# Patient Record
Sex: Female | Born: 1955
Health system: Southern US, Community
[De-identification: ages and names within clinical notes are randomized; demographics above are authoritative.]

## PROBLEM LIST (undated history)

## (undated) DIAGNOSIS — I10 Essential (primary) hypertension: Secondary | ICD-10-CM

## (undated) DIAGNOSIS — K219 Gastro-esophageal reflux disease without esophagitis: Secondary | ICD-10-CM

## (undated) DIAGNOSIS — K5900 Constipation, unspecified: Secondary | ICD-10-CM

## (undated) DIAGNOSIS — T7840XA Allergy, unspecified, initial encounter: Secondary | ICD-10-CM

## (undated) DIAGNOSIS — G709 Myoneural disorder, unspecified: Secondary | ICD-10-CM

## (undated) DIAGNOSIS — E079 Disorder of thyroid, unspecified: Secondary | ICD-10-CM

## (undated) DIAGNOSIS — J302 Other seasonal allergic rhinitis: Secondary | ICD-10-CM

## (undated) DIAGNOSIS — F32A Depression, unspecified: Secondary | ICD-10-CM

## (undated) DIAGNOSIS — E785 Hyperlipidemia, unspecified: Secondary | ICD-10-CM

## (undated) DIAGNOSIS — D649 Anemia, unspecified: Secondary | ICD-10-CM

## (undated) DIAGNOSIS — E860 Dehydration: Secondary | ICD-10-CM

## (undated) DIAGNOSIS — N6459 Other signs and symptoms in breast: Secondary | ICD-10-CM

## (undated) DIAGNOSIS — G35 Multiple sclerosis: Secondary | ICD-10-CM

## (undated) DIAGNOSIS — M1990 Unspecified osteoarthritis, unspecified site: Secondary | ICD-10-CM

## (undated) DIAGNOSIS — F329 Major depressive disorder, single episode, unspecified: Secondary | ICD-10-CM

## (undated) DIAGNOSIS — G35D Multiple sclerosis, unspecified: Secondary | ICD-10-CM

## (undated) HISTORY — DX: Gastro-esophageal reflux disease without esophagitis: K21.9

## (undated) HISTORY — DX: Essential (primary) hypertension: I10

## (undated) HISTORY — DX: Multiple sclerosis: G35

## (undated) HISTORY — DX: Other seasonal allergic rhinitis: J30.2

## (undated) HISTORY — PX: COLONOSCOPY: SHX174

## (undated) HISTORY — DX: Constipation, unspecified: K59.00

## (undated) HISTORY — DX: Myoneural disorder, unspecified: G70.9

## (undated) HISTORY — DX: Anemia, unspecified: D64.9

## (undated) HISTORY — DX: Disorder of thyroid, unspecified: E07.9

## (undated) HISTORY — PX: UPPER GASTROINTESTINAL ENDOSCOPY: SHX188

## (undated) HISTORY — DX: Hyperlipidemia, unspecified: E78.5

## (undated) HISTORY — PX: TONSILLECTOMY: SUR1361

## (undated) HISTORY — DX: Unspecified osteoarthritis, unspecified site: M19.90

## (undated) HISTORY — DX: Dehydration: E86.0

## (undated) HISTORY — DX: Depression, unspecified: F32.A

## (undated) HISTORY — PX: TMJ ARTHROSCOPY: SHX1067

## (undated) HISTORY — DX: Major depressive disorder, single episode, unspecified: F32.9

## (undated) HISTORY — DX: Multiple sclerosis, unspecified: G35.D

## (undated) HISTORY — PX: HYSTEROSCOPY: SHX211

## (undated) HISTORY — DX: Allergy, unspecified, initial encounter: T78.40XA

---

## 1999-01-16 ENCOUNTER — Other Ambulatory Visit: Admission: RE | Admit: 1999-01-16 | Discharge: 1999-01-16 | Payer: Self-pay | Admitting: *Deleted

## 2000-08-11 ENCOUNTER — Other Ambulatory Visit: Admission: RE | Admit: 2000-08-11 | Discharge: 2000-08-11 | Payer: Self-pay | Admitting: *Deleted

## 2000-08-26 ENCOUNTER — Encounter: Admission: RE | Admit: 2000-08-26 | Discharge: 2000-08-26 | Payer: Self-pay | Admitting: Family Medicine

## 2000-08-26 ENCOUNTER — Encounter: Payer: Self-pay | Admitting: Family Medicine

## 2000-12-10 ENCOUNTER — Ambulatory Visit (HOSPITAL_COMMUNITY): Admission: RE | Admit: 2000-12-10 | Discharge: 2000-12-10 | Payer: Self-pay | Admitting: Gynecology

## 2000-12-10 ENCOUNTER — Encounter (INDEPENDENT_AMBULATORY_CARE_PROVIDER_SITE_OTHER): Payer: Self-pay

## 2001-07-05 ENCOUNTER — Encounter: Admission: RE | Admit: 2001-07-05 | Discharge: 2001-10-03 | Payer: Self-pay | Admitting: Family Medicine

## 2002-03-14 ENCOUNTER — Encounter: Payer: Self-pay | Admitting: Family Medicine

## 2002-03-14 ENCOUNTER — Encounter: Admission: RE | Admit: 2002-03-14 | Discharge: 2002-03-14 | Payer: Self-pay | Admitting: Family Medicine

## 2002-03-20 ENCOUNTER — Other Ambulatory Visit: Admission: RE | Admit: 2002-03-20 | Discharge: 2002-03-20 | Payer: Self-pay | Admitting: *Deleted

## 2003-02-02 ENCOUNTER — Ambulatory Visit (HOSPITAL_COMMUNITY): Admission: RE | Admit: 2003-02-02 | Discharge: 2003-02-02 | Payer: Self-pay | Admitting: Gynecology

## 2003-02-02 ENCOUNTER — Encounter (INDEPENDENT_AMBULATORY_CARE_PROVIDER_SITE_OTHER): Payer: Self-pay

## 2003-04-19 ENCOUNTER — Ambulatory Visit (HOSPITAL_BASED_OUTPATIENT_CLINIC_OR_DEPARTMENT_OTHER): Admission: RE | Admit: 2003-04-19 | Discharge: 2003-04-19 | Payer: Self-pay | Admitting: Family Medicine

## 2003-05-21 ENCOUNTER — Encounter: Admission: RE | Admit: 2003-05-21 | Discharge: 2003-05-21 | Payer: Self-pay | Admitting: Family Medicine

## 2003-08-02 ENCOUNTER — Other Ambulatory Visit: Admission: RE | Admit: 2003-08-02 | Discharge: 2003-08-02 | Payer: Self-pay | Admitting: Gynecology

## 2005-01-31 ENCOUNTER — Encounter: Admission: RE | Admit: 2005-01-31 | Discharge: 2005-01-31 | Payer: Self-pay | Admitting: Neurology

## 2005-03-04 ENCOUNTER — Encounter: Admission: RE | Admit: 2005-03-04 | Discharge: 2005-03-04 | Payer: Self-pay | Admitting: Neurology

## 2005-06-22 ENCOUNTER — Other Ambulatory Visit: Admission: RE | Admit: 2005-06-22 | Discharge: 2005-06-22 | Payer: Self-pay | Admitting: Gynecology

## 2005-07-10 ENCOUNTER — Ambulatory Visit (HOSPITAL_COMMUNITY): Admission: RE | Admit: 2005-07-10 | Discharge: 2005-07-10 | Payer: Self-pay | Admitting: Gynecology

## 2005-07-13 ENCOUNTER — Emergency Department (HOSPITAL_COMMUNITY): Admission: EM | Admit: 2005-07-13 | Discharge: 2005-07-14 | Payer: Self-pay | Admitting: Emergency Medicine

## 2005-08-26 ENCOUNTER — Other Ambulatory Visit: Admission: RE | Admit: 2005-08-26 | Discharge: 2005-08-26 | Payer: Self-pay | Admitting: Surgery

## 2006-04-03 ENCOUNTER — Encounter: Admission: RE | Admit: 2006-04-03 | Discharge: 2006-04-03 | Payer: Self-pay | Admitting: Neurology

## 2006-04-16 ENCOUNTER — Ambulatory Visit (HOSPITAL_COMMUNITY): Admission: RE | Admit: 2006-04-16 | Discharge: 2006-04-16 | Payer: Self-pay | Admitting: Neurology

## 2006-07-09 ENCOUNTER — Other Ambulatory Visit: Admission: RE | Admit: 2006-07-09 | Discharge: 2006-07-09 | Payer: Self-pay | Admitting: Gynecology

## 2006-07-29 ENCOUNTER — Ambulatory Visit (HOSPITAL_COMMUNITY): Admission: RE | Admit: 2006-07-29 | Discharge: 2006-07-29 | Payer: Self-pay | Admitting: Family Medicine

## 2006-08-10 ENCOUNTER — Encounter: Admission: RE | Admit: 2006-08-10 | Discharge: 2006-08-10 | Payer: Self-pay | Admitting: Obstetrics and Gynecology

## 2007-05-18 ENCOUNTER — Encounter: Payer: Self-pay | Admitting: Gynecology

## 2007-05-18 ENCOUNTER — Ambulatory Visit (HOSPITAL_BASED_OUTPATIENT_CLINIC_OR_DEPARTMENT_OTHER): Admission: RE | Admit: 2007-05-18 | Discharge: 2007-05-18 | Payer: Self-pay | Admitting: Gynecology

## 2008-01-31 ENCOUNTER — Ambulatory Visit: Payer: Self-pay | Admitting: Internal Medicine

## 2008-01-31 ENCOUNTER — Encounter (INDEPENDENT_AMBULATORY_CARE_PROVIDER_SITE_OTHER): Payer: Self-pay | Admitting: Family Medicine

## 2008-01-31 LAB — CONVERTED CEMR LAB
ALT: 13 units/L (ref 0–35)
AST: 21 units/L (ref 0–37)
Albumin: 4.2 g/dL (ref 3.5–5.2)
Alkaline Phosphatase: 135 units/L — ABNORMAL HIGH (ref 39–117)
BUN: 12 mg/dL (ref 6–23)
Basophils Absolute: 0 10*3/uL (ref 0.0–0.1)
Basophils Relative: 1 % (ref 0–1)
CO2: 23 meq/L (ref 19–32)
Calcium: 9.6 mg/dL (ref 8.4–10.5)
Chloride: 101 meq/L (ref 96–112)
Cholesterol: 196 mg/dL (ref 0–200)
Creatinine, Ser: 0.95 mg/dL (ref 0.40–1.20)
Eosinophils Absolute: 0.2 10*3/uL (ref 0.0–0.7)
Eosinophils Relative: 3 % (ref 0–5)
FSH: 33.9 milliintl units/mL
Glucose, Bld: 83 mg/dL (ref 70–99)
HCT: 38.8 % (ref 36.0–46.0)
HDL: 59 mg/dL (ref >39)
Hemoglobin: 12.4 g/dL (ref 12.0–15.0)
LDL Cholesterol: 118 mg/dL — ABNORMAL HIGH (ref 0–99)
MCHC: 32 g/dL (ref 30.0–36.0)
MCV: 79 fL (ref 78.0–100.0)
Monocytes Absolute: 0.5 10*3/uL (ref 0.1–1.0)
Neutro Abs: 5 10*3/uL (ref 1.7–7.7)
Neutrophils Relative %: 61 % (ref 43–77)
Platelets: 344 10*3/uL (ref 150–400)
Potassium: 3.9 meq/L (ref 3.5–5.3)
RBC: 4.91 M/uL (ref 3.87–5.11)
RDW: 16.5 % — ABNORMAL HIGH (ref 11.5–15.5)
Sodium: 136 meq/L (ref 135–145)
TSH: 0.73 microintl units/mL (ref 0.350–4.50)
Total Bilirubin: 0.5 mg/dL (ref 0.3–1.2)
Total CHOL/HDL Ratio: 3.3
Triglycerides: 97 mg/dL (ref <150)
VLDL: 19 mg/dL (ref 0–40)

## 2008-02-01 ENCOUNTER — Ambulatory Visit: Payer: Self-pay | Admitting: *Deleted

## 2008-04-02 ENCOUNTER — Ambulatory Visit: Payer: Self-pay | Admitting: Internal Medicine

## 2008-04-16 ENCOUNTER — Ambulatory Visit (HOSPITAL_COMMUNITY): Admission: RE | Admit: 2008-04-16 | Discharge: 2008-04-16 | Payer: Self-pay | Admitting: Internal Medicine

## 2008-07-19 ENCOUNTER — Ambulatory Visit: Payer: Self-pay | Admitting: Internal Medicine

## 2008-08-07 ENCOUNTER — Encounter (INDEPENDENT_AMBULATORY_CARE_PROVIDER_SITE_OTHER): Payer: Self-pay | Admitting: Adult Health

## 2008-08-07 ENCOUNTER — Ambulatory Visit: Payer: Self-pay | Admitting: Internal Medicine

## 2008-08-07 LAB — CONVERTED CEMR LAB
ALT: 12 units/L (ref 0–35)
AST: 20 units/L (ref 0–37)
Albumin: 4.2 g/dL (ref 3.5–5.2)
Alkaline Phosphatase: 124 units/L — ABNORMAL HIGH (ref 39–117)
BUN: 15 mg/dL (ref 6–23)
Basophils Absolute: 0 10*3/uL (ref 0.0–0.1)
Basophils Relative: 0 % (ref 0–1)
CO2: 26 meq/L (ref 19–32)
Calcium: 9.4 mg/dL (ref 8.4–10.5)
Chloride: 101 meq/L (ref 96–112)
Cholesterol: 182 mg/dL (ref 0–200)
Creatinine, Ser: 0.83 mg/dL (ref 0.40–1.20)
Eosinophils Absolute: 0.2 10*3/uL (ref 0.0–0.7)
Eosinophils Relative: 2 % (ref 0–5)
Glucose, Bld: 79 mg/dL (ref 70–99)
HCT: 36.3 % (ref 36.0–46.0)
HDL: 65 mg/dL (ref >39)
Hemoglobin: 11.3 g/dL — ABNORMAL LOW (ref 12.0–15.0)
LDL Cholesterol: 101 mg/dL — ABNORMAL HIGH (ref 0–99)
Lymphocytes Relative: 26 % (ref 12–46)
Lymphs Abs: 2.4 10*3/uL (ref 0.7–4.0)
MCHC: 31.1 g/dL (ref 30.0–36.0)
MCV: 78.4 fL (ref 78.0–100.0)
Monocytes Absolute: 0.5 10*3/uL (ref 0.1–1.0)
Monocytes Relative: 5 % (ref 3–12)
Neutro Abs: 5.9 10*3/uL (ref 1.7–7.7)
Neutrophils Relative %: 66 % (ref 43–77)
Platelets: 299 10*3/uL (ref 150–400)
Potassium: 3.9 meq/L (ref 3.5–5.3)
RBC: 4.63 M/uL (ref 3.87–5.11)
RDW: 17.5 % — ABNORMAL HIGH (ref 11.5–15.5)
Sodium: 139 meq/L (ref 135–145)
TSH: 0.481 microintl units/mL (ref 0.350–4.500)
Total Bilirubin: 0.5 mg/dL (ref 0.3–1.2)
Total CHOL/HDL Ratio: 2.8
Total Protein: 8.2 g/dL (ref 6.0–8.3)
Triglycerides: 78 mg/dL (ref <150)
VLDL: 16 mg/dL (ref 0–40)
Vit D, 25-Hydroxy: 28 ng/mL — ABNORMAL LOW (ref 30–89)
WBC: 9 10*3/uL (ref 4.0–10.5)

## 2008-09-13 ENCOUNTER — Ambulatory Visit: Payer: Self-pay | Admitting: Internal Medicine

## 2009-01-02 ENCOUNTER — Encounter (INDEPENDENT_AMBULATORY_CARE_PROVIDER_SITE_OTHER): Payer: Self-pay | Admitting: Adult Health

## 2009-01-02 ENCOUNTER — Ambulatory Visit: Payer: Self-pay | Admitting: Family Medicine

## 2009-01-02 LAB — CONVERTED CEMR LAB
ALT: 15 units/L (ref 0–35)
AST: 20 units/L (ref 0–37)
Albumin: 4 g/dL (ref 3.5–5.2)
Alkaline Phosphatase: 109 units/L (ref 39–117)
BUN: 15 mg/dL (ref 6–23)
Basophils Absolute: 0 10*3/uL (ref 0.0–0.1)
Basophils Relative: 0 % (ref 0–1)
CO2: 26 meq/L (ref 19–32)
Calcium: 9.1 mg/dL (ref 8.4–10.5)
Chloride: 102 meq/L (ref 96–112)
Creatinine, Ser: 0.92 mg/dL (ref 0.40–1.20)
Eosinophils Absolute: 0.2 10*3/uL (ref 0.0–0.7)
Eosinophils Relative: 3 % (ref 0–5)
Glucose, Bld: 94 mg/dL (ref 70–99)
HCT: 38 % (ref 36.0–46.0)
Hemoglobin: 11.5 g/dL — ABNORMAL LOW (ref 12.0–15.0)
Hgb A1c MFr Bld: 5.8 % (ref 4.6–6.1)
Lymphocytes Relative: 29 % (ref 12–46)
Lymphs Abs: 2.2 10*3/uL (ref 0.7–4.0)
MCHC: 30.3 g/dL (ref 30.0–36.0)
MCV: 81.7 fL (ref 78.0–100.0)
Microalb, Ur: 1.17 mg/dL (ref 0.00–1.89)
Monocytes Absolute: 0.6 10*3/uL (ref 0.1–1.0)
Monocytes Relative: 8 % (ref 3–12)
Neutro Abs: 4.5 10*3/uL (ref 1.7–7.7)
Neutrophils Relative %: 60 % (ref 43–77)
Platelets: 340 10*3/uL (ref 150–400)
Potassium: 4.4 meq/L (ref 3.5–5.3)
Pro B Natriuretic peptide (BNP): 5.8 pg/mL (ref 0.0–100.0)
RBC: 4.65 M/uL (ref 3.87–5.11)
RDW: 17.7 % — ABNORMAL HIGH (ref 11.5–15.5)
Sodium: 141 meq/L (ref 135–145)
TSH: 0.525 microintl units/mL (ref 0.350–4.500)
Total Bilirubin: 0.4 mg/dL (ref 0.3–1.2)
Total Protein: 7.5 g/dL (ref 6.0–8.3)
WBC: 7.5 10*3/uL (ref 4.0–10.5)

## 2009-01-10 ENCOUNTER — Ambulatory Visit: Payer: Self-pay | Admitting: Internal Medicine

## 2009-01-23 ENCOUNTER — Ambulatory Visit (HOSPITAL_COMMUNITY): Admission: RE | Admit: 2009-01-23 | Discharge: 2009-01-23 | Payer: Self-pay | Admitting: Internal Medicine

## 2009-01-24 ENCOUNTER — Ambulatory Visit: Payer: Self-pay | Admitting: Internal Medicine

## 2009-02-13 ENCOUNTER — Ambulatory Visit: Payer: Self-pay | Admitting: Internal Medicine

## 2009-02-28 ENCOUNTER — Ambulatory Visit: Payer: Self-pay | Admitting: Internal Medicine

## 2009-03-12 ENCOUNTER — Ambulatory Visit: Payer: Self-pay | Admitting: Internal Medicine

## 2009-04-02 ENCOUNTER — Ambulatory Visit: Payer: Self-pay | Admitting: Internal Medicine

## 2009-05-02 ENCOUNTER — Ambulatory Visit (HOSPITAL_COMMUNITY): Admission: RE | Admit: 2009-05-02 | Discharge: 2009-05-02 | Payer: Self-pay | Admitting: Family Medicine

## 2009-06-04 ENCOUNTER — Ambulatory Visit: Payer: Self-pay | Admitting: Family Medicine

## 2009-06-12 ENCOUNTER — Encounter (INDEPENDENT_AMBULATORY_CARE_PROVIDER_SITE_OTHER): Payer: Self-pay | Admitting: Adult Health

## 2009-06-12 ENCOUNTER — Ambulatory Visit: Payer: Self-pay | Admitting: Internal Medicine

## 2009-06-12 LAB — CONVERTED CEMR LAB
ALT: 13 units/L (ref 0–35)
AST: 18 units/L (ref 0–37)
Albumin: 4 g/dL (ref 3.5–5.2)
Alkaline Phosphatase: 101 units/L (ref 39–117)
BUN: 16 mg/dL (ref 6–23)
Basophils Absolute: 0 10*3/uL (ref 0.0–0.1)
Basophils Relative: 0 % (ref 0–1)
CO2: 26 meq/L (ref 19–32)
Calcium: 9.9 mg/dL (ref 8.4–10.5)
Chloride: 102 meq/L (ref 96–112)
Cholesterol: 187 mg/dL (ref 0–200)
Creatinine, Ser: 0.92 mg/dL (ref 0.40–1.20)
Eosinophils Absolute: 0.2 10*3/uL (ref 0.0–0.7)
Eosinophils Relative: 3 % (ref 0–5)
Glucose, Bld: 94 mg/dL (ref 70–99)
HCT: 36 % (ref 36.0–46.0)
HDL: 58 mg/dL (ref >39)
Hemoglobin: 11 g/dL — ABNORMAL LOW (ref 12.0–15.0)
LDL Cholesterol: 120 mg/dL — ABNORMAL HIGH (ref 0–99)
Lymphocytes Relative: 27 % (ref 12–46)
Lymphs Abs: 2 10*3/uL (ref 0.7–4.0)
MCHC: 30.6 g/dL (ref 30.0–36.0)
MCV: 81.3 fL (ref 78.0–100.0)
Monocytes Absolute: 0.4 10*3/uL (ref 0.1–1.0)
Monocytes Relative: 6 % (ref 3–12)
Neutro Abs: 4.7 10*3/uL (ref 1.7–7.7)
Neutrophils Relative %: 64 % (ref 43–77)
Platelets: 351 10*3/uL (ref 150–400)
Potassium: 4.4 meq/L (ref 3.5–5.3)
RBC: 4.43 M/uL (ref 3.87–5.11)
RDW: 16.9 % — ABNORMAL HIGH (ref 11.5–15.5)
Sodium: 139 meq/L (ref 135–145)
TSH: 0.491 microintl units/mL (ref 0.350–4.500)
Total Bilirubin: 0.3 mg/dL (ref 0.3–1.2)
Total CHOL/HDL Ratio: 3.2
Total Protein: 7.6 g/dL (ref 6.0–8.3)
Triglycerides: 47 mg/dL (ref <150)
VLDL: 9 mg/dL (ref 0–40)
WBC: 7.4 10*3/uL (ref 4.0–10.5)

## 2009-06-27 ENCOUNTER — Ambulatory Visit: Payer: Self-pay | Admitting: Internal Medicine

## 2009-07-17 ENCOUNTER — Ambulatory Visit: Payer: Self-pay | Admitting: Internal Medicine

## 2009-07-31 ENCOUNTER — Ambulatory Visit: Payer: Self-pay | Admitting: Internal Medicine

## 2009-08-06 ENCOUNTER — Ambulatory Visit: Payer: Self-pay | Admitting: Internal Medicine

## 2009-08-20 ENCOUNTER — Ambulatory Visit: Payer: Self-pay | Admitting: Family Medicine

## 2010-06-22 ENCOUNTER — Encounter: Payer: Self-pay | Admitting: Family Medicine

## 2010-06-23 ENCOUNTER — Encounter: Payer: Self-pay | Admitting: Internal Medicine

## 2010-09-23 ENCOUNTER — Ambulatory Visit (INDEPENDENT_AMBULATORY_CARE_PROVIDER_SITE_OTHER): Payer: Medicare Other | Admitting: Internal Medicine

## 2010-09-23 ENCOUNTER — Other Ambulatory Visit: Payer: Self-pay | Admitting: Internal Medicine

## 2010-09-23 ENCOUNTER — Encounter: Payer: Self-pay | Admitting: Internal Medicine

## 2010-09-23 DIAGNOSIS — I10 Essential (primary) hypertension: Secondary | ICD-10-CM

## 2010-09-23 DIAGNOSIS — R911 Solitary pulmonary nodule: Secondary | ICD-10-CM | POA: Insufficient documentation

## 2010-09-23 DIAGNOSIS — R5383 Other fatigue: Secondary | ICD-10-CM | POA: Insufficient documentation

## 2010-09-23 DIAGNOSIS — J984 Other disorders of lung: Secondary | ICD-10-CM

## 2010-09-23 DIAGNOSIS — Z13220 Encounter for screening for lipoid disorders: Secondary | ICD-10-CM

## 2010-09-23 DIAGNOSIS — N3281 Overactive bladder: Secondary | ICD-10-CM | POA: Insufficient documentation

## 2010-09-23 DIAGNOSIS — Z79899 Other long term (current) drug therapy: Secondary | ICD-10-CM

## 2010-09-23 DIAGNOSIS — R5381 Other malaise: Secondary | ICD-10-CM

## 2010-09-23 DIAGNOSIS — G35 Multiple sclerosis: Secondary | ICD-10-CM

## 2010-09-23 DIAGNOSIS — Z1231 Encounter for screening mammogram for malignant neoplasm of breast: Secondary | ICD-10-CM

## 2010-09-23 DIAGNOSIS — N318 Other neuromuscular dysfunction of bladder: Secondary | ICD-10-CM

## 2010-09-23 DIAGNOSIS — E041 Nontoxic single thyroid nodule: Secondary | ICD-10-CM

## 2010-09-23 NOTE — Progress Notes (Signed)
  Subjective:    Patient ID: Sydney Pace, female    DOB: 1955-11-13, 55 y.o.   MRN: [id number]  HPI patient presents to clinic to establish primary medical care. Has a known history of multiple sclerosis followed regularly by neurology. Has chronic fatigue presumably related to her MS. Previously took Provigil however stopped the medication approximately 2 weeks ago. Has had no recent MS flare. Does take a combination of Cymbalta and Wellbutrin without adverse effect and feels medications are helpful including the Cymbalta helping pain. Chart indicates history of very small pulmonary nodule noted on chest CT dated every two thousand eight and August 2010. Left upper lobe pulmonary nodule measured to be approximately 6 mm x 9 mm and unchanged on serial chest CTs. There is instrumentation of thyroid nodule in two thousand ten chest CT measured 0.9 x 1.7 cm. Has had no further followup. Has had difficulty losing weight and wishes to resume exercise program though this is limited somewhat by fatigue. Has history of hypertension currently well controlled with ACE inhibitor and diuretic without cough. Does have overactive bladder take medication for this as well. No exacerbating or alleviating factors. Overdue for a mammogram. No other current complaints  Review of past medical history, past surgical history, medications, allergies, social history and family history    Review of Systems  Constitutional: Positive for fatigue. Negative for fever and chills.  HENT: Negative for ear pain, congestion and facial swelling.   Eyes: Negative for pain and redness.  Respiratory: Negative for cough and shortness of breath.   Cardiovascular: Negative for chest pain and palpitations.  Gastrointestinal: Negative for abdominal pain and blood in stool.  Genitourinary: Positive for urgency and frequency. Negative for difficulty urinating.  Musculoskeletal: Positive for back pain. Negative for gait problem.  Skin:  Negative for color change, pallor and rash.  Neurological: Positive for tremors. Negative for seizures and weakness.  Hematological: Negative for adenopathy. Does not bruise/bleed easily.  Psychiatric/Behavioral: Negative for confusion and agitation. The patient is not nervous/anxious.        Objective:   Physical Exam    Physical Exam  [nursing notereviewed. Constitutional:  appears well-developed and well-nourished. No distress.  HENT:  Head: Normocephalic and atraumatic.  Right Ear: Tympanic membrane, external ear and ear canal normal.  Left Ear: Tympanic membrane, external ear and ear canal normal.  Nose: Nose normal.  Neck: Right sided thyroid prominence questionable nodule. nontender. Mouth/Throat: Oropharynx is clear and moist. No oropharyngeal exudate.  Eyes: Conjunctivae are normal. No scleral icterus.  Neck: Neck supple.  Cardiovascular: Normal rate, regular rhythm and normal heart sounds.  Exam reveals no gallop and no friction rub.   No murmur heard. Pulmonary/Chest: Effort normal and breath sounds normal. No respiratory distress. She has no wheezes. She has no rales.  Lymphadenopathy:     no cervical adenopathy.  Neurological:  alert. Intermittent bilateral upper extremity  tremor. Skin: Skin is warm and dry.  not diaphoretic.      Assessment & Plan:

## 2010-09-23 NOTE — Assessment & Plan Note (Signed)
Likely association to multiple sclerosis. Obtain basic labs. Off Provigil currently. Consider amantadine

## 2010-09-23 NOTE — Assessment & Plan Note (Signed)
Stable. Continue current regimen. Can consider DC diuretic in the future if blood pressure remains normotensive

## 2010-09-23 NOTE — Assessment & Plan Note (Signed)
Schedule thyroid ultrasound for interval change. Obtain TSH

## 2010-09-23 NOTE — Assessment & Plan Note (Signed)
Stable without recent exacerbation. Likely associated fatigue. Continue neurology follow up. Consider amantadine

## 2010-09-23 NOTE — Assessment & Plan Note (Signed)
Stable normotensive. Continue current regimen. Consider possible DC of diuretic in the future if possible. Obtain CBC, Chem-7 and lipid panel

## 2010-09-24 ENCOUNTER — Other Ambulatory Visit: Payer: Medicare Other

## 2010-09-24 LAB — CBC WITH DIFFERENTIAL/PLATELET
Basophils Relative: 0.5 % (ref 0.0–3.0)
HCT: 37.8 % (ref 36.0–46.0)
Hemoglobin: 12.7 g/dL (ref 12.0–15.0)
Lymphocytes Relative: 31.1 % (ref 12.0–46.0)
MCHC: 33.5 g/dL (ref 30.0–36.0)
Monocytes Relative: 5.7 % (ref 3.0–12.0)
Neutro Abs: 4.7 10*3/uL (ref 1.4–7.7)
RBC: 4.32 Mil/uL (ref 3.87–5.11)

## 2010-09-24 LAB — BASIC METABOLIC PANEL
BUN: 18 mg/dL (ref 6–23)
CO2: 32 mEq/L (ref 19–32)
Calcium: 9.5 mg/dL (ref 8.4–10.5)
Chloride: 103 mEq/L (ref 96–112)
Creatinine, Ser: 1.1 mg/dL (ref 0.4–1.2)
GFR: 66.15 mL/min (ref >60.00)
Glucose, Bld: 93 mg/dL (ref 70–99)
Potassium: 4.7 mEq/L (ref 3.5–5.1)
Sodium: 141 mEq/L (ref 135–145)

## 2010-09-24 LAB — LIPID PANEL
Cholesterol: 185 mg/dL (ref 0–200)
HDL: 48.7 mg/dL (ref >39.00)
LDL Cholesterol: 125 mg/dL — ABNORMAL HIGH (ref 0–99)
Total CHOL/HDL Ratio: 4
Triglycerides: 56 mg/dL (ref 0.0–149.0)
VLDL: 11.2 mg/dL (ref 0.0–40.0)

## 2010-09-24 LAB — TSH: TSH: 0.57 u[IU]/mL (ref 0.35–5.50)

## 2010-09-24 LAB — HEPATIC FUNCTION PANEL
Alkaline Phosphatase: 96 U/L (ref 39–117)
Bilirubin, Direct: 0.1 mg/dL (ref 0.0–0.3)
Total Bilirubin: 0.6 mg/dL (ref 0.3–1.2)

## 2010-09-24 NOTE — Progress Notes (Signed)
Addended by: Rossie Muskrat on: 09/24/2010 08:34 AM   Modules accepted: Orders

## 2010-09-26 ENCOUNTER — Ambulatory Visit
Admission: RE | Admit: 2010-09-26 | Discharge: 2010-09-26 | Disposition: A | Discharge status: Home / Self Care | Payer: Medicare Other | Source: Ambulatory Visit | Attending: Internal Medicine | Admitting: Internal Medicine

## 2010-09-26 DIAGNOSIS — E041 Nontoxic single thyroid nodule: Secondary | ICD-10-CM

## 2010-09-28 ENCOUNTER — Other Ambulatory Visit: Payer: Self-pay | Admitting: Internal Medicine

## 2010-09-28 DIAGNOSIS — E042 Nontoxic multinodular goiter: Secondary | ICD-10-CM

## 2010-09-29 ENCOUNTER — Telehealth: Payer: Self-pay

## 2010-09-29 NOTE — Telephone Encounter (Signed)
Message copied by Kyung Rudd on Mon Sep 29, 2010  2:48 PM ------      Message from: Letitia Libra, [place]      Created: Sun Sep 28, 2010 11:20 AM       Labs nl

## 2010-09-29 NOTE — Telephone Encounter (Signed)
Message copied by Kyung Rudd on Mon Sep 29, 2010  2:57 PM ------      Message from: Letitia Libra, [place]      Created: Sun Sep 28, 2010 11:16 AM       Thyroid US does show nodules. Large enough they need to be evaluated by specialist and possibly biopsied.

## 2010-09-29 NOTE — Telephone Encounter (Signed)
Pt aware.

## 2010-10-01 ENCOUNTER — Ambulatory Visit
Admission: RE | Admit: 2010-10-01 | Discharge: 2010-10-01 | Disposition: A | Discharge status: Home / Self Care | Payer: Medicare Other | Source: Ambulatory Visit | Attending: Internal Medicine | Admitting: Internal Medicine

## 2010-10-01 DIAGNOSIS — Z1231 Encounter for screening mammogram for malignant neoplasm of breast: Secondary | ICD-10-CM

## 2010-10-02 ENCOUNTER — Other Ambulatory Visit: Payer: Self-pay | Admitting: Internal Medicine

## 2010-10-02 DIAGNOSIS — R928 Other abnormal and inconclusive findings on diagnostic imaging of breast: Secondary | ICD-10-CM

## 2010-10-08 ENCOUNTER — Other Ambulatory Visit: Payer: Medicare Other

## 2010-10-14 ENCOUNTER — Inpatient Hospital Stay: Admission: RE | Admit: 2010-10-14 | Payer: Medicare Other | Source: Ambulatory Visit

## 2010-10-14 NOTE — H&P (Signed)
Sydney Pace, Sydney Pace              ACCOUNT NO.:  [id number]   MEDICAL RECORD NO.:  [id number]          PATIENT TYPE:  AMB   LOCATION:  [facility]                         FACILITY:  [facility]   PHYSICIAN:  Timothy P. Fontaine, M.D.DATE OF BIRTH:  09-02-1955   DATE OF ADMISSION:  05/18/2007  DATE OF DISCHARGE:                              HISTORY & PHYSICAL   CHIEF COMPLAINT:  Menorrhagia, leiomyoma   HISTORY OF PRESENT ILLNESS:  A 55 year old G 0, female presents  complaining of menorrhagia.  The patient underwent sono-histogram which  showed intramural myomas, as well as an endometrial defect consistent  with a polyp.  She is admitted at this time for hysteroscopy, D&C,  removal of her polyp.   PAST MEDICAL HISTORY:  Significant for:  1. Hypertension.  2. MS.  3. Hypothyroidism.   PAST SURGICAL HISTORY:  Includes:  1. Tonsil adenoidectomy.  2. TMJ surgery.  3. Cryo, hysteroscopy D&C x2 for polyps.   CURRENT MEDICATIONS:  Per medication reconciliation sheet   REVIEW OF SYSTEMS:  Noncontributory.   SOCIAL HISTORY:  Noncontributory.   FAMILY HISTORY:  Noncontributory.   PHYSICAL EXAM:  Afebrile, vital signs stable.  HEENT: Normal.  LUNGS:  Clear.  CARDIAC:  Regular rate.  No rubs, murmurs or gallops.  ABDOMINAL EXAM:  Benign  PELVIC:  External BUS, vagina normal.  Cervix grossly normal.  Uterus  grossly normal in size, midline and mobile, nontender.  Adnexa without  masses or tenderness.   ASSESSMENT:  A 55 year old female history of menorrhagia, leiomyoma.  She recently had been on Depo-Lupron suppression, underwent sono-  histogram which suggests endometrial polyps and is admitted for  hysteroscopy, D&C, removal of polyps.   The plan was to suppress her heavy menses with the Lupron through  menopause, but given the findings of the polyps we feel it is most  prudent to proceed with a hysteroscopy and removal of the polyps.  I  reviewed the proposed surgery with the patient,  the expected  intraoperative, postoperative courses.  She has undergone these  procedures in the past.  I discussed, instrumentation, use of the  resectoscope, D&C.  The acute risks of bleeding, transfusion, infection,  uterine perforation, damage to internal organs including bowel, bladder,  ureters, vessels and nerves either immediately recognized, delay  recognized necessitating major exploratory reparative surgeries, future  reparative surgeries, bowel resection, ostomy formation was all  discussed, understood and accepted.  Distended media absorption was  reviewed, metabolic complications to include seizures, comas discussed,  understood and accepted.  The patient's questions were answered to her  satisfaction.  She is ready to proceed with surgery.      Timothy P. Fontaine, M.D.  Electronically Signed     TPF/MEDQ  D:  05/17/2007  T:  05/18/2007  Job:  [id number]

## 2010-10-14 NOTE — Op Note (Signed)
Sydney Pace, Sydney Pace              ACCOUNT NO.:  [id number]   MEDICAL RECORD NO.:  [id number]          PATIENT TYPE:  AMB   LOCATION:  [facility]                         FACILITY:  [facility]   PHYSICIAN:  Timothy P. Fontaine, M.D.DATE OF BIRTH:  August 13, 1955   DATE OF PROCEDURE:  05/18/2007  DATE OF DISCHARGE:                               OPERATIVE REPORT   PREOPERATIVE DIAGNOSES:  Endometrial polyps.   POSTOPERATIVE DIAGNOSES:  Endometrial polyps.   PROCEDURE:  Hysteroscopic uterine polypectomy, dilatation and curettage.   SURGEON:  Timothy P. Fontaine, M.D.   ANESTHESIA:  General with 1% lidocaine paracervical block.   SPECIMEN:  1. Endometrial curettings.  2. Endometrial polyps.   COMPLICATIONS:  None   ESTIMATED BLOOD LOSS:  Minimal.   SORBITOL DISCREPANCY:  Minimal.   FINDINGS:  EUA external BUS vagina with atrophic changes. Cervix grossly  normal.  Bimanual uterus normal size.  Adnexa without gross masses.  Bimanual limited by abdominal girth.  Hysteroscopic two areas of polyps,  one upper right anterior fundal region, other lower left anterior  region, both excised entirely and sent to pathology as a separate  specimen.  Hysteroscopy, otherwise, was adequate normal noting atrophic  endometrium   PROCEDURE IN DETAIL:  The patient was taken to the operating room and  underwent general anesthesia.  She was placed in the low dorsal  lithotomy position, received a perineal vaginal preparation with  Betadine solution.  The bladder was emptied with in-and-out Foley  catheterization.  EUA performed.  The patient draped in the usual  fashion.  The cervix was visualized with a speculum, the anterior lip  grasped with a single tooth tenaculum.  A paracervical block was placed  using 1% lidocaine, a total of 10 mL.  The cervix was gently gradually  dilated to admit the operative hysteroscope.  Hysteroscopy was performed  with findings noted above.  Using the right angle resectoscope  loupe,  both polyp areas were excised in their entirety at the level of the  surrounding endometrium and sent as separate specimen.  A sharp  curettage was then performed with scant return, again sent as a separate  specimen.  Repeat hysteroscopy showed an empty cavity, good distention,  no evidence of perforation.  The instruments were removed.  Hemostasis  was visualized.  The patient was placed in the supine position, awakened  without difficulty, and taken to the recovery room in good condition  having tolerated procedure well.      Timothy P. Fontaine, M.D.  Electronically Signed     TPF/MEDQ  D:  05/18/2007  T:  05/18/2007  Job:  [id number]

## 2010-10-17 NOTE — H&P (Signed)
Sydney Pace, Sydney Pace                          ACCOUNT NO.:  [id number]   MEDICAL RECORD NO.:  [id number]                   PATIENT TYPE:  AMB   LOCATION:  [facility]                                  FACILITY:  [facility]   PHYSICIAN:  Timothy P. Fontaine, M.D.           DATE OF BIRTH:  1956/05/17   DATE OF ADMISSION:  DATE OF DISCHARGE:                                HISTORY & PHYSICAL   DATE OF ADMISSION:  February 02, 2003 at 7:30 a.m.   CHIEF COMPLAINT:  Heavy bleeding.   HISTORY OF PRESENT ILLNESS:  A 55 year old G4 P0 AB3 female who presents  with a history of progressively worsening menorrhagia.  She had a  sonohysterogram which showed some intramural fibroids, the largest measuring  51 mm.  She had a saline infusion sonohysterogram which outlined an  endometrial polyp measuring 15 x 10 x 9 mm along the anterior wall.  She is  admitted for hysteroscopy resection of the polyp/D&C.   PAST MEDICAL HISTORY:  Significant for multiple sclerosis and high blood  pressure.   PAST SURGICAL HISTORY:  1. Tonsillectomy.  2. TMJ surgery.  3. Cryosurgery to the cervix.  4. Hysteroscopy D&C.   ALLERGIES:  Seasonal.   REVIEW OF SYSTEMS:  Noncontributory.   SOCIAL HISTORY:  Noncontributory.   FAMILY HISTORY:  Noncontributory.   CURRENT MEDICATIONS:  Calcium, Nexium, Atacand for blood pressure, Provigil  for her MS, hydrochlorothiazide for blood pressure, multivitamins, Anaprox  DS.   PHYSICAL EXAMINATION:  VITAL SIGNS:  Afebrile; vital signs stable.  HEENT:  Normal.  LUNGS:  Clear.  CARDIAC:  Regular rate; no rubs, murmurs, or gallops.  ABDOMEN:  Benign.  PELVIC:  External, BUS, vagina normal.  Cervix normal.  Uterus limited by  abdominal girth.  No gross masses or significant tenderness.  Adnexa without  masses or tenderness.   ASSESSMENT:  A 55 year old with progressive menorrhagia, history of  leiomyomata.  Sonohysterogram shows endometrial polyp.  Outpatient  laboratory evaluation  shows normal thyroid panel, normal FSH, and a  hemoglobin of 11.5.  She is admitted for hysteroscopy resection of  polyp/D&C.  The risks, benefits, indications, and alternatives were reviewed  with the patient to include the expected intraoperative and postoperative  courses.  Instrumentation, use of the resectoscope was reviewed, and the  risks of bleeding, transfusion, infection, uterine perforation, damage to  internal organs including bowel, bladder, ureters, vessels, and nerves  necessitating major exploratory reparative surgeries and future reparative  surgeries including ostomy formation was all discussed, understood, and  accepted.  The patient's questions were answered to her satisfaction and she  is ready to proceed with surgery.                                               Timothy P. Fontaine,  M.D.    TPF/MEDQ  D:  01/31/2003  T:  01/31/2003  Job:  [id number]

## 2010-10-17 NOTE — Op Note (Signed)
   NAMEDALAINA, Sydney Pace                          ACCOUNT NO.:  [id number]   MEDICAL RECORD NO.:  [id number]                   PATIENT TYPE:  AMB   LOCATION:  [facility]                                  FACILITY:  [facility]   PHYSICIAN:  Timothy P. Fontaine, M.D.           DATE OF BIRTH:  [birth year]   DATE OF PROCEDURE:  02/02/2003  DATE OF DISCHARGE:                                 OPERATIVE REPORT   PREOPERATIVE DIAGNOSIS:  Endometrial polyp.   POSTOPERATIVE DIAGNOSES:  Endometrial polyp.   PROCEDURES:  1. Hysteroscopic resection, endometrial polyp.  2. Dilatation and curettage.   SURGEON:  Timothy P. Fontaine, M.D.   ANESTHESIA:  General.   COMPLICATIONS:  None.   ESTIMATED BLOOD LOSS:  Minimal.   SORBITOL DISCREPANCY:  Approximately 50 mL.   SPECIMENS:  1. Endometrial polyp.  2. Endometrial curetting.   FINDINGS:  Left anterior mid cavity polyp, excised at its base.  Remainder  of the hysteroscopy was normal.   PROCEDURE:  The patient was taken to the operating room, underwent general  anesthesia, was placed in the dorsal lithotomy position, received a vaginal  perineal preparation with Betadine solution per nursing personnel and the  bladder was emptied with in-and-out Foley catheterization.  The patient was  draped in the usual fashion. The cervix visualized with a speculum.  The  anterior lip grasped with a single-tooth tenaculum and the cervix was gently  gradually dilated to admit the operative hysteroscope.  Hysteroscopy was  performed with findings noted above.  Using the right-angle resectoscopic  loop, the polyp was excised at its base and sent to pathology.  A sharp  curettage was then performed without difficulty and was sent to pathology.  Rehysteroscopy after the curettage had good showed good distention, empty  cavity, no evidence of perforation.  The instruments were removed.  Hemostasis visualized.  The patient placed in the supine position, awakened  without  difficulty, taken to the recovery room in good condition having  tolerated the procedure well.                                               Timothy P. Audie Box, M.D.   TPF/MEDQ  D:  02/02/2003  T:  02/02/2003  Job:  [id number]

## 2010-10-17 NOTE — H&P (Signed)
[facility]  Patient:    Sydney Pace, Sydney Pace                       MRN: [id number] Attending:  Nadyne Coombes. Fontaine, M.D.                         History and Physical  SCHEDULED SURGERY DATE:       12/10/2000 at 2 p.m.  CHIEF COMPLAINT:              Menorrhagia.  HISTORY OF PRESENT ILLNESS:   A 55 year old, G4, P0, Ab3, female presents with  history of menorrhagia. The patient notes over the last several years her periods have progressively gotten heavier where she will now flood for several days each month with total menses lasting approximately five days. She underwent ultrasound evaluation which showed multiple small myomas, the largest measuring 44 mm and sonohysterogram performed which shows an endometrial polyp projecting from the anterior cavity. The patient is admitted now for hysteroscopy/D&C.  PAST MEDICAL HISTORY:         Significant for hypertension.  PAST SURGICAL HISTORY:        Tonsillectomy, TMJ surgery, cryosurgery of the cervix.  ALLERGIES:                    No known drug allergies.  MEDICATIONS:                  Hydrochlorothiazide 25 mg per day, Paxil, Vioxx, Zyrtec.  REVIEW OF SYSTEMS:            Noncontributory.  SOCIAL HISTORY:               No cigarette, alcohol abuse.  ADMISSION PHYSICAL EXAMINATION:  VITAL SIGNS:                  Afebrile, vital signs stable.  HEENT:                        Normal.  LUNGS:                        Clear.  CARDIAC:                      Regular rate, no rubs, murmurs, or gallops.  ABDOMEN:                      Benign.  PELVIC:                       External, BUS, vagina normal. Cervix grossly normal. Uterus limited by abdominal girth but grossly normal. Adnexa without masses or tenderness.  ASSESSMENT:                   A 55 year old, G5, P4, Ab3, female, not sexually active, history of worsening menorrhagia. Ultrasound showing multiple small myomas as well as a polypoid growth  from the anterior uterine cavity.  PLAN:                         The patient is admitted for hysteroscopy/D&C. The risks, benefits, indications, and alternatives for the procedure were reviewed with the patient. I discussed what is involved with hysteroscopy/D&C, the use of the resectoscope, and the risks associated with the procedure to include  infection, transfusion, uterine perforation, damage to internal organs including bowel, bladder, ureters, vessels, and nerves necessitating major exploratory reparative surgeries and future reparative surgeries, all of which was discussed, understood, and accepted. I reviewed with the patient that we are addressing only the polypoid area within the cavity. She does have multiple myomas and no guarantees were made as far as relief of her heavy periods or dysmenorrhea, as I think these are probably are more associated with her leiomyomata, but we will at least initially evaluate her cavity to include this polypoid area and then followup postoperatively and see how her menses are. The patient is comfortable with the plan and her questions are answered and she is ready to proceed with surgery.  DD:  12/03/00 TD:  12/03/00 Job: [id number]

## 2010-10-17 NOTE — Op Note (Signed)
[facility]  Patient:    Sydney Pace, Sydney Pace                       MRN: [id number] Proc. Date: 12/10/00 Adm. Date:  [id number] Attending:  Merrily Pew                           Operative Report  PREOPERATIVE DIAGNOSES:       1. Menorrhagia.                               2. Leiomyomata.                               3. Rule out endometrial polyp.  POSTOPERATIVE DIAGNOSES:      1. Menorrhagia.                               2. Leiomyomata.  OPERATION:                    Hysteroscopy dilatation and curettage.  SURGEON:                      Timothy P. Fontaine, M.D.  ANESTHESIA:                   General.  ESTIMATED BLOOD LOSS:         Minimal  COMPLICATIONS:                 None.  SORBITOL DISCREPANCY:         Approximately 100 cc.  SPECIMENS:                    Endometrial curetting.  FINDINGS:                     "Fluffy" endometrial pattern anterior cavity. No true polyps seen.  Posterior uterine surface grossly normal.  Fundus normal.  Right and left tubal ostia normal.  Low uterine segment normal. Endocervical canal normal.  DESCRIPTION OF PROCEDURE:     The patient was taken to the operating room and underwent general anesthesia.  She was placed in the low dorsolithotomy position, received a perineal and vaginal preparation with Betadine solution and bladder emptied with Foley catheterization.  The patient was draped in the usual fashion.  The cervix was visualized and the anterior lip of the cervix grasped with a single-tooth tenaculum.  The cervix was gently dilated to admit the operative hysteroscope.  Hysteroscopy was performed with findings of a proliferative, undulating anterior cavity pattern, although no true polyps seen.  No other abnormalities were seen.  A sharp curettage was performed. Repeat hysteroscopy showed complete emptying of the cavity.  Good uterine distention, no evidence of perforation, and a normal cavity  visualized.  The instruments were then removed, the tenaculum removed.  Adequate hemostasis was visualized.  The patient was placed in the supine position and awakened without difficulty and taken to the recovery room in good condition having tolerated the procedure well. DD:  12/10/00 TD:  12/10/00 Job: [id number]

## 2010-10-20 ENCOUNTER — Telehealth: Payer: Self-pay

## 2010-10-20 ENCOUNTER — Ambulatory Visit
Admission: RE | Admit: 2010-10-20 | Discharge: 2010-10-20 | Disposition: A | Discharge status: Home / Self Care | Payer: Medicare Other | Source: Ambulatory Visit | Attending: Internal Medicine | Admitting: Internal Medicine

## 2010-10-20 DIAGNOSIS — R928 Other abnormal and inconclusive findings on diagnostic imaging of breast: Secondary | ICD-10-CM

## 2010-10-20 NOTE — Telephone Encounter (Signed)
Pt notfied.

## 2010-10-20 NOTE — Telephone Encounter (Signed)
Message copied by Kyung Rudd on Mon Oct 20, 2010  5:01 PM ------      Message from: Letitia Libra, Maisie Fus      Created: Mon Oct 20, 2010  2:34 PM       pls notify diagnostic mammogram neg/nl. No abnormality seen

## 2011-01-23 ENCOUNTER — Ambulatory Visit: Payer: Medicare Other | Admitting: Internal Medicine

## 2011-01-23 ENCOUNTER — Encounter: Payer: Self-pay | Admitting: Internal Medicine

## 2011-01-23 ENCOUNTER — Ambulatory Visit (INDEPENDENT_AMBULATORY_CARE_PROVIDER_SITE_OTHER): Payer: Medicare Other | Admitting: Internal Medicine

## 2011-01-23 DIAGNOSIS — M7989 Other specified soft tissue disorders: Secondary | ICD-10-CM | POA: Insufficient documentation

## 2011-01-23 DIAGNOSIS — E042 Nontoxic multinodular goiter: Secondary | ICD-10-CM

## 2011-01-23 DIAGNOSIS — F329 Major depressive disorder, single episode, unspecified: Secondary | ICD-10-CM

## 2011-01-23 DIAGNOSIS — F32A Depression, unspecified: Secondary | ICD-10-CM

## 2011-01-23 DIAGNOSIS — E041 Nontoxic single thyroid nodule: Secondary | ICD-10-CM

## 2011-01-23 MED ORDER — FUROSEMIDE 20 MG PO TABS: ORAL_TABLET | ORAL | Status: DC

## 2011-01-23 NOTE — Assessment & Plan Note (Signed)
Hx and exam not suggestive of dvt. Begin lasix prn.

## 2011-01-23 NOTE — Assessment & Plan Note (Signed)
suboptimal control despite cymbalta/wellbutrin and therapist. Schedule psychiatry referral.

## 2011-01-23 NOTE — Assessment & Plan Note (Signed)
Endocrinology consult. Consider bx.

## 2011-01-23 NOTE — Progress Notes (Signed)
  Subjective:    Patient ID: Sydney Pace, female    DOB: [birth year], 55 y.o.   MRN: [id number]  HPI Pt presents to clinic for followup of multiple medical problems. Notes poorly controlled depression with intermitent crying spells. No obvious external stressors currently. Seeing therapist who mentioned psychiatrist referral. BP reviewed nl. Reviewed past h/o thyroid nodules with nl tsh. No dysphagia or neck pain. Does note intermittent bilateral le swelling of ankles. No associated cp or dyspnea. Worsens by the end of the day. No other complaints.  Past Medical History  Diagnosis Date  . Depression   . Allergy   . GERD (gastroesophageal reflux disease)   . Hypertension   . Thyroid disease    Past Surgical History  Procedure Date  . Tonsillectomy     reports that she has never smoked. She does not have any smokeless tobacco history on file. She reports that she does not drink alcohol or use illicit drugs. family history is not on file. No Known Allergies   Review of Systems see hpi    Objective:   Physical Exam  Physical Exam  Nursing note and vitals reviewed. Constitutional: Appears well-developed and well-nourished. No distress.  HENT:  Head: Normocephalic and atraumatic.  Right Ear: External ear normal.  Left Ear: External ear normal.  Eyes: Conjunctivae are normal. No scleral icterus.  Neck: Neck supple. Carotid bruit is not present.  Cardiovascular: Normal rate, regular rhythm and normal heart sounds.  Exam reveals no gallop and no friction rub.   No murmur heard. Pulmonary/Chest: Effort normal and breath sounds normal. No respiratory distress. He has no wheezes. no rales.  Lymphadenopathy:    He has no cervical adenopathy.  Neurological:Alert.  Skin: Skin is warm and dry. Not diaphoretic.  Psychiatric: Has a normal mood and affect.   Ext: +soft tissue swelling without edema     Assessment & Plan:

## 2011-02-09 ENCOUNTER — Other Ambulatory Visit: Payer: Self-pay | Admitting: *Deleted

## 2011-02-09 MED ORDER — LISINOPRIL-HYDROCHLOROTHIAZIDE 20-25 MG PO TABS: 1.0000 | ORAL_TABLET | Freq: Every day | ORAL | Status: DC

## 2011-02-09 NOTE — Telephone Encounter (Signed)
Patient called requesting a refill on Lisinopril. She would like to know if she could get a 90 day supply to [place] on [address].  Rx refill sent to [facility] pharmacy.

## 2011-03-06 LAB — POCT PREGNANCY, URINE: Preg Test, Ur: NEGATIVE

## 2011-05-22 ENCOUNTER — Ambulatory Visit: Payer: Medicare Other | Admitting: Internal Medicine

## 2011-05-22 DIAGNOSIS — Z0289 Encounter for other administrative examinations: Secondary | ICD-10-CM

## 2011-06-16 ENCOUNTER — Encounter: Payer: Self-pay | Admitting: Internal Medicine

## 2011-06-16 ENCOUNTER — Ambulatory Visit (HOSPITAL_BASED_OUTPATIENT_CLINIC_OR_DEPARTMENT_OTHER)
Admission: RE | Admit: 2011-06-16 | Discharge: 2011-06-16 | Disposition: A | Discharge status: Home / Self Care | Payer: Medicare Other | Source: Ambulatory Visit | Attending: Internal Medicine | Admitting: Internal Medicine

## 2011-06-16 ENCOUNTER — Ambulatory Visit (INDEPENDENT_AMBULATORY_CARE_PROVIDER_SITE_OTHER): Payer: Medicare Other | Admitting: Internal Medicine

## 2011-06-16 DIAGNOSIS — Z79899 Other long term (current) drug therapy: Secondary | ICD-10-CM

## 2011-06-16 DIAGNOSIS — E049 Nontoxic goiter, unspecified: Secondary | ICD-10-CM | POA: Insufficient documentation

## 2011-06-16 DIAGNOSIS — E041 Nontoxic single thyroid nodule: Secondary | ICD-10-CM

## 2011-06-16 DIAGNOSIS — E042 Nontoxic multinodular goiter: Secondary | ICD-10-CM

## 2011-06-16 DIAGNOSIS — I10 Essential (primary) hypertension: Secondary | ICD-10-CM

## 2011-06-16 LAB — TSH: TSH: 0.733 u[IU]/mL (ref 0.350–4.500)

## 2011-06-16 LAB — HEPATIC FUNCTION PANEL
Albumin: 3.9 g/dL (ref 3.5–5.2)
Total Bilirubin: 0.3 mg/dL (ref 0.3–1.2)
Total Protein: 7.3 g/dL (ref 6.0–8.3)

## 2011-06-16 LAB — BASIC METABOLIC PANEL
BUN: 19 mg/dL (ref 6–23)
Calcium: 9.6 mg/dL (ref 8.4–10.5)
Creat: 0.92 mg/dL (ref 0.50–1.10)

## 2011-06-16 NOTE — Progress Notes (Signed)
  Subjective:    Patient ID: Sydney Pace, female    DOB: 02-May-1956, 56 y.o.   MRN: [id number]  HPI Pt presents to clinic for followup of multiple medical problems. Known history of thyroid nodules and has repeatedly been referred to endocrine for further evaluation but has not kept appointments. BP reviewed as normotensive. No active complaint.  Past Medical History  Diagnosis Date  . Depression   . Allergy   . GERD (gastroesophageal reflux disease)   . Hypertension   . Thyroid disease    Past Surgical History  Procedure Date  . Tonsillectomy     reports that she has never smoked. She has never used smokeless tobacco. She reports that she does not drink alcohol or use illicit drugs. family history is not on file. No Known Allergies    Review of Systems see hpi     Objective:   Physical Exam  Nursing note and vitals reviewed. Constitutional: She appears well-developed and well-nourished. No distress.  HENT:  Head: Normocephalic and atraumatic.  Right Ear: External ear normal.  Left Ear: External ear normal.  Eyes: Conjunctivae are normal. No scleral icterus.  Neck: Neck supple.       +thyroid nodules. NT  Cardiovascular: Normal rate, regular rhythm and normal heart sounds.  Exam reveals no gallop and no friction rub.   No murmur heard. Pulmonary/Chest: Effort normal and breath sounds normal. No respiratory distress. She has no wheezes. She has no rales.  Neurological: She is alert.  Skin: Skin is warm and dry. She is not diaphoretic.  Psychiatric: She has a normal mood and affect.          Assessment & Plan:

## 2011-06-20 NOTE — Assessment & Plan Note (Signed)
Long discussion held. Reiterated the need for further evaluation. Explained again that thyroid nodules are potentially cancerous. Strongly recommended endocrinology referral and potential need for biopsy. Pt stated understanding and agreement. Schedule follow up thyroid ultrasound. Obtain tsh. Schedule endocrinology referral. Close followup scheduled as well.

## 2011-06-20 NOTE — Assessment & Plan Note (Addendum)
Normotensive and stable. Continue current regimen. Monitor bp as outpt and followup in clinic as scheduled. Obtain chem7 

## 2011-07-09 ENCOUNTER — Other Ambulatory Visit: Payer: Self-pay | Admitting: Endocrinology

## 2011-07-09 DIAGNOSIS — E041 Nontoxic single thyroid nodule: Secondary | ICD-10-CM

## 2011-07-14 ENCOUNTER — Ambulatory Visit
Admission: RE | Admit: 2011-07-14 | Discharge: 2011-07-14 | Disposition: A | Discharge status: Home / Self Care | Payer: Medicare Other | Source: Ambulatory Visit | Attending: Endocrinology | Admitting: Endocrinology

## 2011-07-14 ENCOUNTER — Other Ambulatory Visit (HOSPITAL_COMMUNITY)
Admission: RE | Admit: 2011-07-14 | Discharge: 2011-07-14 | Disposition: A | Discharge status: Home / Self Care | Payer: Medicare Other | Source: Ambulatory Visit | Attending: Interventional Radiology | Admitting: Interventional Radiology

## 2011-07-14 DIAGNOSIS — E049 Nontoxic goiter, unspecified: Secondary | ICD-10-CM | POA: Insufficient documentation

## 2011-07-14 DIAGNOSIS — E041 Nontoxic single thyroid nodule: Secondary | ICD-10-CM

## 2011-12-08 ENCOUNTER — Telehealth: Payer: Self-pay | Admitting: Internal Medicine

## 2011-12-08 MED ORDER — LISINOPRIL-HYDROCHLOROTHIAZIDE 20-25 MG PO TABS: 1.0000 | ORAL_TABLET | Freq: Every day | ORAL | Status: DC

## 2011-12-08 NOTE — Telephone Encounter (Signed)
Rx sent 

## 2011-12-08 NOTE — Telephone Encounter (Signed)
Refill- lisino-hctz 20-25mg  tab. Take one tablet by mouth every day. Qty 90 last fill 4.15.13

## 2012-03-01 ENCOUNTER — Other Ambulatory Visit: Payer: Self-pay | Admitting: Internal Medicine

## 2012-03-01 DIAGNOSIS — Z1231 Encounter for screening mammogram for malignant neoplasm of breast: Secondary | ICD-10-CM

## 2012-03-04 ENCOUNTER — Telehealth: Payer: Self-pay | Admitting: Internal Medicine

## 2012-03-04 MED ORDER — FUROSEMIDE 20 MG PO TABS: 20.0000 mg | ORAL_TABLET | Freq: Every morning | ORAL | Status: DC

## 2012-03-04 NOTE — Telephone Encounter (Signed)
Refill- furosemide 20mg  tab. Take one tablet by mouth in the morning as needed for leg swelling. Qty 30 last fill 5.17.13

## 2012-03-04 NOTE — Telephone Encounter (Signed)
Rx done #30x0--**PATIENT OVERDUE FOR OFFICE VISIT-NO FUTURE REFILLS WITHOUT PRIOR MD VISIT**/SLS

## 2012-03-22 ENCOUNTER — Ambulatory Visit (HOSPITAL_COMMUNITY)
Admission: RE | Admit: 2012-03-22 | Discharge: 2012-03-22 | Disposition: A | Discharge status: Home / Self Care | Payer: Medicare Other | Source: Ambulatory Visit | Attending: Internal Medicine | Admitting: Internal Medicine

## 2012-03-22 DIAGNOSIS — Z1231 Encounter for screening mammogram for malignant neoplasm of breast: Secondary | ICD-10-CM | POA: Insufficient documentation

## 2012-04-05 ENCOUNTER — Telehealth: Payer: Self-pay | Admitting: Internal Medicine

## 2012-04-05 MED ORDER — LISINOPRIL-HYDROCHLOROTHIAZIDE 20-25 MG PO TABS: 1.0000 | ORAL_TABLET | Freq: Every day | ORAL | Status: DC

## 2012-04-05 NOTE — Telephone Encounter (Signed)
Refill- lisino-hctz 20-25mg  tab. Take one tablet by mouth every day. Qty 90 last fill 7.9.13

## 2012-04-05 NOTE — Telephone Encounter (Signed)
30-day supply to pharmacy/SLS--*PATIENT OVERDUE FOR OFFICE VISIT-REQUIRED PRIOR TO FUTURE REFILLS*

## 2012-05-16 ENCOUNTER — Other Ambulatory Visit (HOSPITAL_COMMUNITY)
Admission: RE | Admit: 2012-05-16 | Discharge: 2012-05-16 | Disposition: A | Discharge status: Home / Self Care | Payer: Medicare Other | Source: Ambulatory Visit | Attending: Gynecology | Admitting: Gynecology

## 2012-05-16 ENCOUNTER — Encounter: Payer: Self-pay | Admitting: Gynecology

## 2012-05-16 ENCOUNTER — Ambulatory Visit (INDEPENDENT_AMBULATORY_CARE_PROVIDER_SITE_OTHER): Payer: Medicare Other | Admitting: Gynecology

## 2012-05-16 VITALS — BP 130/80 | Ht 65.25 in | Wt 286.0 lb

## 2012-05-16 DIAGNOSIS — Z01419 Encounter for gynecological examination (general) (routine) without abnormal findings: Secondary | ICD-10-CM

## 2012-05-16 DIAGNOSIS — A499 Bacterial infection, unspecified: Secondary | ICD-10-CM

## 2012-05-16 DIAGNOSIS — Z1151 Encounter for screening for human papillomavirus (HPV): Secondary | ICD-10-CM | POA: Insufficient documentation

## 2012-05-16 DIAGNOSIS — M949 Disorder of cartilage, unspecified: Secondary | ICD-10-CM

## 2012-05-16 DIAGNOSIS — B9689 Other specified bacterial agents as the cause of diseases classified elsewhere: Secondary | ICD-10-CM

## 2012-05-16 DIAGNOSIS — N760 Acute vaginitis: Secondary | ICD-10-CM

## 2012-05-16 DIAGNOSIS — N949 Unspecified condition associated with female genital organs and menstrual cycle: Secondary | ICD-10-CM

## 2012-05-16 DIAGNOSIS — D259 Leiomyoma of uterus, unspecified: Secondary | ICD-10-CM

## 2012-05-16 DIAGNOSIS — M8580 Other specified disorders of bone density and structure, unspecified site: Secondary | ICD-10-CM

## 2012-05-16 DIAGNOSIS — M899 Disorder of bone, unspecified: Secondary | ICD-10-CM

## 2012-05-16 DIAGNOSIS — Z124 Encounter for screening for malignant neoplasm of cervix: Secondary | ICD-10-CM

## 2012-05-16 DIAGNOSIS — N898 Other specified noninflammatory disorders of vagina: Secondary | ICD-10-CM

## 2012-05-16 LAB — WET PREP FOR TRICH, YEAST, CLUE: Yeast Wet Prep HPF POC: NONE SEEN

## 2012-05-16 MED ORDER — METRONIDAZOLE 500 MG PO TABS: 500.0000 mg | ORAL_TABLET | Freq: Two times a day (BID) | ORAL | Status: DC

## 2012-05-16 NOTE — Patient Instructions (Signed)
Take flagyl antibiotic 2 times daily. Follow up for bone density as scheduled Follow up in one year for annual exam

## 2012-05-16 NOTE — Progress Notes (Signed)
Sydney Pace [birth year] [id number]        56 y.o.  G3P0030 for annual exam.  Has not been seen since 2008. Several issues noted below.  Past medical history,surgical history, medications, allergies, family history and social history were all reviewed and documented in the EPIC chart. ROS:  Was performed and pertinent positives and negatives are included in the history.  Exam: Sydney Pace assistant Filed Vitals:   05/16/12 0948  BP: 130/80  Height: 5' 5.25" (1.657 m)  Weight: 286 lb (129.729 kg)   General appearance  Normal Skin grossly normal Head/Neck normal with no cervical or supraclavicular adenopathy thyroid normal Lungs  clear Cardiac RR, without RMG Abdominal  soft, nontender, without masses, organomegaly or hernia Breasts  examined lying and sitting without masses, retractions, discharge or axillary adenopathy.  Bilateral inverted nipples as she is always had, easily reverts. Pelvic  Ext/BUS/vagina  Thick white discharge.  Cervix  normal Pap/HPV  Uterus  grossly normal size, nontender. Exam limited by abdominal girth  Adnexa  Without masses or tenderness. Exam limited by abdominal girth    Anus and perineum  normal   Rectovaginal  normal sphincter tone without palpated masses or tenderness.    Assessment/Plan:  56 y.o. G46P0030 female for annual exam.   1. Postmenopausal.  Last menstrual period approximate 2008. No bleeding since then. Doing well from a symptom standpoint and will continue to monitor. Knows to report any bleeding. 2. Vaginal odor.  Wet prep and symptoms consistent with bacterial vaginosis. Treat with Flagyl 500 mg twice a day x7 days, alcohol bone was reviewed. Follow up if symptoms persist or recur. 3. History leiomyoma. Exam is grossly normal today. She's having no symptoms/bleeding and will continue to monitor. 4. History osteopenia. DEXA 2004 with T score -1.1. Follow up 2008 T score unrecorded. Schedule DEXA now the patient will follow up for this. Increase  calcium vitamin D reviewed. 5. Mammography 03/2012. Continue with annual mammography. Bilateral inverted nipples as she is always had and they revert easily. SBE monthly reviewed. 6. Pap smear/HPV done today. No history of abnormal Pap smears previously.  Last Pap smear in chart 2010. 7. Colonoscopy H. 49. Repeat at age 11 per their recommendation. 8. Obesity. We discussed weight loss strategies and I encouraged her to continue to lose weight as she is doing. 9. Health maintenance. No blood work done as it is all done through her primary physician's office who she sees on a regular basis. Follow up for DEXA otherwise annually.    Sydney Lords MD, 10:05 AM 05/16/2012

## 2012-05-16 NOTE — Addendum Note (Signed)
Addended by: Richardson Chiquito on: 05/16/2012 10:31 AM   Modules accepted: Orders

## 2012-05-17 LAB — URINALYSIS W MICROSCOPIC + REFLEX CULTURE
Bilirubin Urine: NEGATIVE
Crystals: NONE SEEN
Leukocytes, UA: NEGATIVE
Protein, ur: NEGATIVE mg/dL
Squamous Epithelial / LPF: NONE SEEN

## 2012-06-20 ENCOUNTER — Telehealth: Payer: Self-pay | Admitting: Internal Medicine

## 2012-06-20 MED ORDER — LISINOPRIL-HYDROCHLOROTHIAZIDE 20-25 MG PO TABS: 1.0000 | ORAL_TABLET | Freq: Every day | ORAL | Status: DC

## 2012-06-20 NOTE — Telephone Encounter (Signed)
Adele Schilder, CMA 03/04/2012 5:01 PM Signed  Rx done #30x0--**PATIENT OVERDUE FOR OFFICE VISIT-NO FUTURE REFILLS WITHOUT PRIOR MD VISIT**/SLS   [facility] w/contact name & number to inform pt that it is imperative that OV be scheduled for refill authorizations/SLS

## 2012-06-20 NOTE — Telephone Encounter (Signed)
Refill- furosemide 20 mg tab. Take one tablet by mouth in the morning as needed for leg swelling--Must have OV before more refills.

## 2012-06-20 NOTE — Telephone Encounter (Signed)
30-day refill to pharmacy w/notation: pt overdue for 0V; [facility] w/contact name & number to inform pt that it is imperative that an OV be scheduled prior to future refill authorizations/SLS

## 2012-06-20 NOTE — Telephone Encounter (Signed)
Patient states that she would like a refill of her bp med sent to [facility] on [address]

## 2012-06-21 ENCOUNTER — Encounter: Payer: Medicare Other | Admitting: Family Medicine

## 2012-06-21 ENCOUNTER — Encounter: Payer: Medicare Other | Admitting: Internal Medicine

## 2012-08-01 ENCOUNTER — Ambulatory Visit: Payer: Medicare Other | Admitting: Family Medicine

## 2012-08-02 ENCOUNTER — Encounter: Payer: Self-pay | Admitting: Family Medicine

## 2012-08-02 ENCOUNTER — Ambulatory Visit (INDEPENDENT_AMBULATORY_CARE_PROVIDER_SITE_OTHER): Payer: Medicare Other | Admitting: Family Medicine

## 2012-08-02 VITALS — BP 142/92 | HR 82 | Temp 98.7°F | Ht 66.0 in | Wt 282.1 lb

## 2012-08-02 DIAGNOSIS — J329 Chronic sinusitis, unspecified: Secondary | ICD-10-CM

## 2012-08-02 DIAGNOSIS — K219 Gastro-esophageal reflux disease without esophagitis: Secondary | ICD-10-CM

## 2012-08-02 DIAGNOSIS — E785 Hyperlipidemia, unspecified: Secondary | ICD-10-CM

## 2012-08-02 DIAGNOSIS — T7840XD Allergy, unspecified, subsequent encounter: Secondary | ICD-10-CM

## 2012-08-02 DIAGNOSIS — I10 Essential (primary) hypertension: Secondary | ICD-10-CM

## 2012-08-02 DIAGNOSIS — Z5189 Encounter for other specified aftercare: Secondary | ICD-10-CM

## 2012-08-02 MED ORDER — OXYBUTYNIN CHLORIDE 5 MG PO TABS: 5.0000 mg | ORAL_TABLET | Freq: Two times a day (BID) | ORAL | Status: DC

## 2012-08-02 MED ORDER — ARMODAFINIL 150 MG PO TABS: 150.0000 mg | ORAL_TABLET | Freq: Every day | ORAL | Status: DC

## 2012-08-02 MED ORDER — GUAIFENESIN ER 600 MG PO TB12: 600.0000 mg | ORAL_TABLET | Freq: Two times a day (BID) | ORAL | Status: DC

## 2012-08-02 MED ORDER — FUROSEMIDE 20 MG PO TABS: ORAL_TABLET | ORAL | Status: DC

## 2012-08-02 MED ORDER — LISINOPRIL-HYDROCHLOROTHIAZIDE 20-25 MG PO TABS: 1.0000 | ORAL_TABLET | Freq: Every day | ORAL | Status: DC

## 2012-08-02 MED ORDER — MELOXICAM 15 MG PO TABS: 15.0000 mg | ORAL_TABLET | Freq: Every day | ORAL | Status: DC | PRN

## 2012-08-02 MED ORDER — AMOXICILLIN-POT CLAVULANATE 875-125 MG PO TABS: 1.0000 | ORAL_TABLET | Freq: Two times a day (BID) | ORAL | Status: DC

## 2012-08-02 NOTE — Patient Instructions (Addendum)
Probiotic daily whenever taking antibiotics such as [employer]  REl of Rec, Dr Leilani Merl at [facility] just labs for past year  Annual exam at next   Sinusitis Sinusitis is redness, soreness, and swelling (inflammation) of the paranasal sinuses. Paranasal sinuses are air pockets within the bones of your face (beneath the eyes, the middle of the forehead, or above the eyes). In healthy paranasal sinuses, mucus is able to drain out, and air is able to circulate through them by way of your nose. However, when your paranasal sinuses are inflamed, mucus and air can become trapped. This can allow bacteria and other germs to grow and cause infection. Sinusitis can develop quickly and last only a short time (acute) or continue over a long period (chronic). Sinusitis that lasts for more than 12 weeks is considered chronic.  CAUSES  Causes of sinusitis include:  Allergies.  Structural abnormalities, such as displacement of the cartilage that separates your nostrils (deviated septum), which can decrease the air flow through your nose and sinuses and affect sinus drainage.  Functional abnormalities, such as when the small hairs (cilia) that line your sinuses and help remove mucus do not work properly or are not present. SYMPTOMS  Symptoms of acute and chronic sinusitis are the same. The primary symptoms are pain and pressure around the affected sinuses. Other symptoms include:  Upper toothache.  Earache.  Headache.  Bad breath.  Decreased sense of smell and taste.  A cough, which worsens when you are lying flat.  Fatigue.  Fever.  Thick drainage from your nose, which often is green and may contain pus (purulent).  Swelling and warmth over the affected sinuses. DIAGNOSIS  Your caregiver will perform a physical exam. During the exam, your caregiver may:  Look in your nose for signs of abnormal growths in your nostrils (nasal polyps).  Tap over the affected sinus to check for  signs of infection.  View the inside of your sinuses (endoscopy) with a special imaging device with a light attached (endoscope), which is inserted into your sinuses. If your caregiver suspects that you have chronic sinusitis, one or more of the following tests may be recommended:  Allergy tests.  Nasal culture A sample of mucus is taken from your nose and sent to a lab and screened for bacteria.  Nasal cytology A sample of mucus is taken from your nose and examined by your caregiver to determine if your sinusitis is related to an allergy. TREATMENT  Most cases of acute sinusitis are related to a viral infection and will resolve on their own within 10 days. Sometimes medicines are prescribed to help relieve symptoms (pain medicine, decongestants, nasal steroid sprays, or saline sprays).  However, for sinusitis related to a bacterial infection, your caregiver will prescribe antibiotic medicines. These are medicines that will help kill the bacteria causing the infection.  Rarely, sinusitis is caused by a fungal infection. In theses cases, your caregiver will prescribe antifungal medicine. For some cases of chronic sinusitis, surgery is needed. Generally, these are cases in which sinusitis recurs more than 3 times per year, despite other treatments. HOME CARE INSTRUCTIONS   Drink plenty of water. Water helps thin the mucus so your sinuses can drain more easily.  Use a humidifier.  Inhale steam 3 to 4 times a day (for example, sit in the bathroom with the shower running).  Apply a warm, moist washcloth to your face 3 to 4 times a day, or as directed by your caregiver.  Use saline  nasal sprays to help moisten and clean your sinuses.  Take over-the-counter or prescription medicines for pain, discomfort, or fever only as directed by your caregiver. SEEK IMMEDIATE MEDICAL CARE IF:  You have increasing pain or severe headaches.  You have nausea, vomiting, or drowsiness.  You have swelling  around your face.  You have vision problems.  You have a stiff neck.  You have difficulty breathing. MAKE SURE YOU:   Understand these instructions.  Will watch your condition.  Will get help right away if you are not doing well or get worse. Document Released: 05/18/2005 Document Revised: 08/10/2011 Document Reviewed: 06/02/2011 [facility] Patient Information 2013 [place], [place].

## 2012-08-08 ENCOUNTER — Encounter: Payer: Self-pay | Admitting: Family Medicine

## 2012-08-08 DIAGNOSIS — K219 Gastro-esophageal reflux disease without esophagitis: Secondary | ICD-10-CM

## 2012-08-08 DIAGNOSIS — T7840XA Allergy, unspecified, initial encounter: Secondary | ICD-10-CM | POA: Insufficient documentation

## 2012-08-08 DIAGNOSIS — J329 Chronic sinusitis, unspecified: Secondary | ICD-10-CM | POA: Insufficient documentation

## 2012-08-08 DIAGNOSIS — E782 Mixed hyperlipidemia: Secondary | ICD-10-CM | POA: Insufficient documentation

## 2012-08-08 DIAGNOSIS — E785 Hyperlipidemia, unspecified: Secondary | ICD-10-CM

## 2012-08-08 HISTORY — DX: Gastro-esophageal reflux disease without esophagitis: K21.9

## 2012-08-08 HISTORY — DX: Hyperlipidemia, unspecified: E78.5

## 2012-08-08 NOTE — Assessment & Plan Note (Signed)
Start antibiotics, probiotics, Mucinex, increase rest and hydration.

## 2012-08-08 NOTE — Assessment & Plan Note (Signed)
Well controlled, no changes today 

## 2012-08-08 NOTE — Assessment & Plan Note (Signed)
Prilosec not helpful, tried some Nexium with good results, avoid offending foods and given rx for Pantoprazole

## 2012-08-08 NOTE — Assessment & Plan Note (Signed)
Mild, encouraged repeat fasting lipid profile. Start MegaRed caps daily

## 2012-08-08 NOTE — Assessment & Plan Note (Signed)
Encouraged OTC antihistamine and Nasacort.

## 2012-08-08 NOTE — Progress Notes (Signed)
Patient ID: Sydney Pace, female   DOB: [birth year], 57 y.o.   MRN: [id number] Sydney Pace [id number] 1956-01-09 08/08/2012      Progress Note-Follow Up  Subjective  Chief Complaint  Chief Complaint  Patient presents with  . Cough    w/phlegm (dark gold) X 4 days, weak, runny nose, headache- on and off    HPI  Patient is a 57 year old female who is in today complaining of worsening congestion. She's been sick off and on for over a month. She's headaches, chills and malaise. His weakness but denies fevers. For the last week she's had a worsening cough productive of yellow phlegm and mild throat irritation. Denies chest pain or palpitations. Does note worsening heartburn. Jomarie Longs has not been helpful but Nexium has been. She notes she was diagnosed with MS back in 1978 but has not had any recent flares. Assessment chronic low back pain however  Past Medical History  Diagnosis Date  . Depression   . Allergy   . GERD (gastroesophageal reflux disease)   . Hypertension   . Thyroid disease   . MS (multiple sclerosis)   . Sinusitis 08/08/2012  . Other and unspecified hyperlipidemia 08/08/2012  . Allergic state 08/08/2012    Past Surgical History  Procedure Laterality Date  . Tonsillectomy    . Tmj arthroscopy    . Hysteroscopy  02, 04, 2008    with D&C    Family History  Problem Relation Age of Onset  . Heart disease Mother     pacemaker  . Emphysema Mother   . Heart disease Father     History   Social History  . Marital Status: Single    Spouse Name: N/A    Number of Children: N/A  . Years of Education: N/A   Occupational History  . Not on file.   Social History Main Topics  . Smoking status: Never Smoker   . Smokeless tobacco: Never Used  . Alcohol Use: No     Comment: occassional  . Drug Use: No  . Sexually Active: Not Currently   Other Topics Concern  . Not on file   Social History Narrative  . No narrative on file    Current Outpatient Prescriptions  on File Prior to Visit  Medication Sig Dispense Refill  . Cholecalciferol (VITAMIN D PO) Take by mouth.      . Multiple Vitamin (MULTIVITAMIN) tablet Take 1 tablet by mouth daily.      . sertraline (ZOLOFT) 25 MG tablet Take 25 mg by mouth daily.      . [DISCONTINUED] oxybutynin (DITROPAN) 5 MG tablet Take 5 mg by mouth 2 (two) times daily.         No current facility-administered medications on file prior to visit.    No Known Allergies  Review of Systems  Review of Systems  Constitutional: Positive for fever, chills and malaise/fatigue.  HENT: Positive for congestion.   Eyes: Negative for discharge.  Respiratory: Positive for cough and sputum production. Negative for shortness of breath.   Cardiovascular: Negative for chest pain, palpitations and leg swelling.  Gastrointestinal: Positive for heartburn. Negative for nausea, abdominal pain and diarrhea.  Genitourinary: Negative for dysuria.  Musculoskeletal: Negative for falls.  Skin: Negative for rash.  Neurological: Positive for headaches. Negative for loss of consciousness.  Endo/Heme/Allergies: Negative for polydipsia.  Psychiatric/Behavioral: Negative for depression and suicidal ideas. The patient is not nervous/anxious and does not have insomnia.     Objective  BP 142/92  Pulse  82  Temp(Src) 98.7 F (37.1 C) (Oral)  Ht 5\' 6"  (1.676 m)  Wt 282 lb 1.3 oz (127.951 kg)  BMI 45.55 kg/m2  SpO2 98%  Physical Exam  Physical Exam  Constitutional: She is oriented to person, place, and time and well-developed, well-nourished, and in no distress. No distress.  HENT:  Head: Normocephalic and atraumatic.  Nasal mucosa boggy and erythematous.  Eyes: Conjunctivae are normal.  Neck: Neck supple. No thyromegaly present.  Cardiovascular: Normal rate, regular rhythm and normal heart sounds.   No murmur heard. Pulmonary/Chest: Effort normal and breath sounds normal. She has no wheezes.  Abdominal: She exhibits no distension and  no mass.  Musculoskeletal: She exhibits no edema.  Lymphadenopathy:    She has no cervical adenopathy.  Neurological: She is alert and oriented to person, place, and time.  Skin: Skin is warm and dry. No rash noted. She is not diaphoretic.  Psychiatric: Memory, affect and judgment normal.    Lab Results  Component Value Date   TSH 0.733 06/16/2011   Lab Results  Component Value Date   WBC 7.9 09/24/2010   HGB 12.7 09/24/2010   HCT 37.8 09/24/2010   MCV 87.6 09/24/2010   PLT 256.0 09/24/2010   Lab Results  Component Value Date   CREATININE 0.92 06/16/2011   BUN 19 06/16/2011   NA 139 06/16/2011   K 4.3 06/16/2011   CL 104 06/16/2011   CO2 27 06/16/2011   Lab Results  Component Value Date   ALT 18 06/16/2011   AST 25 06/16/2011   ALKPHOS 103 06/16/2011   BILITOT 0.3 06/16/2011   Lab Results  Component Value Date   CHOL 185 09/24/2010   Lab Results  Component Value Date   HDL 48.70 09/24/2010   Lab Results  Component Value Date   LDLCALC 125* 09/24/2010   Lab Results  Component Value Date   TRIG 56.0 09/24/2010   Lab Results  Component Value Date   CHOLHDL 4 09/24/2010     Assessment & Plan  Hypertension Well controlled, no changes today  Sinusitis Start antibiotics, probiotics, Mucinex, increase rest and hydration.  Other and unspecified hyperlipidemia Mild, encouraged repeat fasting lipid profile. Start MegaRed caps daily  Allergic state Encouraged OTC antihistamine and Nasacort.  Esophageal reflux Prilosec not helpful, tried some Nexium with good results, avoid offending foods and given rx for Pantoprazole

## 2012-08-09 ENCOUNTER — Telehealth: Payer: Self-pay | Admitting: Family Medicine

## 2012-08-09 MED ORDER — HYDROCOD POLST-CHLORPHEN POLST 10-8 MG/5ML PO LQCR: 5.0000 mL | Freq: Every evening | ORAL | Status: DC | PRN

## 2012-08-09 NOTE — Telephone Encounter (Signed)
Please advise 

## 2012-08-09 NOTE — Telephone Encounter (Signed)
RX sent and pt informed 

## 2012-08-09 NOTE — Telephone Encounter (Signed)
Unfortunately might be viral so time, fluids and rest help most. But if she wants a cough syrup she can have it. Tussionex 1 tsp po qhs prn cough, disp #140, no rf. Causes sedation and will help her rest. If no improvement in a few more days with some rest, then will consider changing from Amox to Augmentin 875

## 2012-08-09 NOTE — Telephone Encounter (Signed)
Was in last Tuesday and got Amoxicillan.  She is not feeling any better and her cough is worse.  She has much more chest congestion now.  She would like something else called in to [facility] on [address]  Particularly a cough medicine

## 2012-08-23 ENCOUNTER — Ambulatory Visit (INDEPENDENT_AMBULATORY_CARE_PROVIDER_SITE_OTHER): Payer: Medicare Other

## 2012-08-23 DIAGNOSIS — M899 Disorder of bone, unspecified: Secondary | ICD-10-CM

## 2012-08-23 DIAGNOSIS — M8580 Other specified disorders of bone density and structure, unspecified site: Secondary | ICD-10-CM

## 2012-09-21 ENCOUNTER — Other Ambulatory Visit: Payer: Self-pay | Admitting: Family Medicine

## 2012-10-31 ENCOUNTER — Other Ambulatory Visit: Payer: Self-pay | Admitting: Family Medicine

## 2012-12-06 ENCOUNTER — Ambulatory Visit (INDEPENDENT_AMBULATORY_CARE_PROVIDER_SITE_OTHER): Payer: Medicare Other | Admitting: Family Medicine

## 2012-12-06 ENCOUNTER — Encounter: Payer: Self-pay | Admitting: Family Medicine

## 2012-12-06 VITALS — BP 118/84 | HR 68 | Temp 98.2°F | Ht 66.0 in | Wt 293.0 lb

## 2012-12-06 DIAGNOSIS — F329 Major depressive disorder, single episode, unspecified: Secondary | ICD-10-CM

## 2012-12-06 DIAGNOSIS — R1013 Epigastric pain: Secondary | ICD-10-CM

## 2012-12-06 DIAGNOSIS — G35 Multiple sclerosis: Secondary | ICD-10-CM

## 2012-12-06 DIAGNOSIS — I10 Essential (primary) hypertension: Secondary | ICD-10-CM

## 2012-12-06 DIAGNOSIS — K219 Gastro-esophageal reflux disease without esophagitis: Secondary | ICD-10-CM

## 2012-12-06 DIAGNOSIS — F32A Depression, unspecified: Secondary | ICD-10-CM

## 2012-12-06 DIAGNOSIS — Z1211 Encounter for screening for malignant neoplasm of colon: Secondary | ICD-10-CM

## 2012-12-06 MED ORDER — FUROSEMIDE 20 MG PO TABS: ORAL_TABLET | ORAL | Status: DC

## 2012-12-06 MED ORDER — LISINOPRIL-HYDROCHLOROTHIAZIDE 20-25 MG PO TABS: 1.0000 | ORAL_TABLET | Freq: Every day | ORAL | Status: DC

## 2012-12-06 MED ORDER — OMEPRAZOLE 40 MG PO CPDR: 40.0000 mg | DELAYED_RELEASE_CAPSULE | Freq: Every day | ORAL | Status: DC

## 2012-12-06 MED ORDER — SUCRALFATE 1 GM/10ML PO SUSP: 1.0000 g | Freq: Three times a day (TID) | ORAL | Status: DC

## 2012-12-06 MED ORDER — SERTRALINE HCL 50 MG PO TABS: 50.0000 mg | ORAL_TABLET | Freq: Every day | ORAL | Status: DC

## 2012-12-06 NOTE — Assessment & Plan Note (Signed)
Well controlled npo changes today

## 2012-12-06 NOTE — Patient Instructions (Addendum)
Rel of rec Dr Leilani Merl at [facility] for past year   Depression, Adult Depression refers to feeling sad, low, down in the dumps, blue, gloomy, or empty. In general, there are two kinds of depression: 1. Depression that we all experience from time to time because of upsetting life experiences, including the loss of a job or the ending of a relationship (normal sadness or normal grief). This kind of depression is considered normal, is short lived, and resolves within a few days to 2 weeks. (Depression experienced after the loss of a loved one is called bereavement. Bereavement often lasts longer than 2 weeks but normally gets better with time.) 2. Clinical depression, which lasts longer than normal sadness or normal grief or interferes with your ability to function at home, at work, and in school. It also interferes with your personal relationships. It affects almost every aspect of your life. Clinical depression is an illness. Symptoms of depression also can be caused by conditions other than normal sadness and grief or clinical depression. Examples of these conditions are listed as follows:  Physical illness Some physical illnesses, including underactive thyroid gland (hypothyroidism), severe anemia, specific types of cancer, diabetes, uncontrolled seizures, heart and lung problems, strokes, and chronic pain are commonly associated with symptoms of depression.  Side effects of some prescription medicine In some people, certain types of prescription medicine can cause symptoms of depression.  Substance abuse Abuse of alcohol and illicit drugs can cause symptoms of depression. SYMPTOMS Symptoms of normal sadness and normal grief include the following:  Feeling sad or crying for short periods of time.  Not caring about anything (apathy).  Difficulty sleeping or sleeping too much.  No longer able to enjoy the things you used to enjoy.  Desire to be by oneself all the time (social isolation).  Lack  of energy or motivation.  Difficulty concentrating or remembering.  Change in appetite or weight.  Restlessness or agitation. Symptoms of clinical depression include the same symptoms of normal sadness or normal grief and also the following symptoms:  Feeling sad or crying all the time.  Feelings of guilt or worthlessness.  Feelings of hopelessness or helplessness.  Thoughts of suicide or the desire to harm yourself (suicidal ideation).  Loss of touch with reality (psychotic symptoms). Seeing or hearing things that are not real (hallucinations) or having false beliefs about your life or the people around you (delusions and paranoia). DIAGNOSIS  The diagnosis of clinical depression usually is based on the severity and duration of the symptoms. Your caregiver also will ask you questions about your medical history and substance use to find out if physical illness, use of prescription medicine, or substance abuse is causing your depression. Your caregiver also may order blood tests. TREATMENT  Typically, normal sadness and normal grief do not require treatment. However, sometimes antidepressant medicine is prescribed for bereavement to ease the depressive symptoms until they resolve. The treatment for clinical depression depends on the severity of your symptoms but typically includes antidepressant medicine, counseling with a mental health professional, or a combination of both. Your caregiver will help to determine what treatment is best for you. Depression caused by physical illness usually goes away with appropriate medical treatment of the illness. If prescription medicine is causing depression, talk with your caregiver about stopping the medicine, decreasing the dose, or substituting another medicine. Depression caused by abuse of alcohol or illicit drugs abuse goes away with abstinence from these substances. Some adults need professional help in order to  stop drinking or using drugs. SEEK  IMMEDIATE CARE IF:  You have thoughts about hurting yourself or others.  You lose touch with reality (have psychotic symptoms).  You are taking medicine for depression and have a serious side effect. FOR MORE INFORMATION National Alliance on Mental Illness: www.nami.[employer]: http://www.maynard.net/ Document Released: 05/15/2000 Document Revised: 11/17/2011 Document Reviewed: 08/17/2011 [facility] Patient Information 2014 [place], [place].

## 2012-12-06 NOTE — Assessment & Plan Note (Signed)
Worsening, will increase the Sertraline to 50 mg daily and reassess at next visit.

## 2012-12-06 NOTE — Assessment & Plan Note (Signed)
Worsening. Started on omeprazole and Carafate due to the burning. Referred for upper endoscopy and is also in need of a screening colonoscopy. Avoid offending foods and H Pylori is checked today.

## 2012-12-06 NOTE — Progress Notes (Signed)
Patient ID: Sydney Pace, female   DOB: January 06, 1956, 57 y.o.   MRN: [id number] Leane Loring [id number] [birth year] 12/06/2012      Progress Note-Follow Up  Subjective  Chief Complaint  Chief Complaint  Patient presents with  . Follow-up    HPI  Patient is a 57 year old female who is in today for followup. She continues to struggle with excessive fatigue. She was seen last her sinusitis and does not go symptoms have resolved. Congestion is improved. Unfortunately she notes since starting Gilenya in January she's been feeling excessively tired and woozy. She notes low mood and worsening heartburn also. Reports very distant history of upper endoscopy and colonoscopy but has not had one in years. Reports at that time she was told to stay on a PPI but did not do so. At present she is dealing heartburn with substernal burning which is overwhelming and even radiates to her back at times. Does not see a clear association between ER not eating and worsening symptoms. No bloody or tarry stools of note she does note she has to strain to move her stools at times. No chest pain or palpitations no shortness or breath or GU complaints but she is noting persistent low mood and depression. Her mother died a year ago and she struggles with anhedonia. No complaints of suicidal ideation.  Past Medical History  Diagnosis Date  . Depression   . Allergy   . GERD (gastroesophageal reflux disease)   . Hypertension   . Thyroid disease   . MS (multiple sclerosis)   . Sinusitis 08/08/2012  . Other and unspecified hyperlipidemia 08/08/2012  . Allergic state 08/08/2012  . Esophageal reflux 08/08/2012    Past Surgical History  Procedure Laterality Date  . Tonsillectomy    . Tmj arthroscopy    . Hysteroscopy  02, 04, 2008    with D&C    Family History  Problem Relation Age of Onset  . Heart disease Mother     pacemaker  . Emphysema Mother   . Heart disease Father     History   Social History  . Marital  Status: Single    Spouse Name: N/A    Number of Children: N/A  . Years of Education: N/A   Occupational History  . Not on file.   Social History Main Topics  . Smoking status: Never Smoker   . Smokeless tobacco: Never Used  . Alcohol Use: No     Comment: occassional  . Drug Use: No  . Sexually Active: Not Currently   Other Topics Concern  . Not on file   Social History Narrative  . No narrative on file    Current Outpatient Prescriptions on File Prior to Visit  Medication Sig Dispense Refill  . Armodafinil (NUVIGIL) 150 MG tablet Take 1 tablet (150 mg total) by mouth daily.  30 tablet  5  . cetirizine (ZYRTEC) 10 MG tablet Take 10 mg by mouth daily.      . Cholecalciferol (VITAMIN D PO) Take by mouth.      Tery Sanfilippo Calcium (STOOL SOFTENER PO) Take by mouth daily as needed.      . Fingolimod HCl (GILENYA) 0.5 MG CAPS Take by mouth daily.      . meloxicam (MOBIC) 15 MG tablet Take 1 tablet (15 mg total) by mouth daily as needed for pain. With food  30 tablet  5  . Multiple Vitamin (MULTIVITAMIN) tablet Take 1 tablet by mouth daily.      Marland Kitchen  oxybutynin (DITROPAN) 5 MG tablet Take 1 tablet (5 mg total) by mouth 2 (two) times daily.  60 tablet  5  . [DISCONTINUED] oxybutynin (DITROPAN) 5 MG tablet Take 5 mg by mouth 2 (two) times daily.         No current facility-administered medications on file prior to visit.    No Known Allergies  Review of Systems  Review of Systems  Constitutional: Positive for malaise/fatigue. Negative for fever.  HENT: Negative for congestion.   Eyes: Negative for pain and discharge.  Respiratory: Negative for shortness of breath.   Cardiovascular: Negative for chest pain, palpitations and leg swelling.  Gastrointestinal: Positive for heartburn and abdominal pain. Negative for nausea, diarrhea, blood in stool and melena.  Genitourinary: Negative for dysuria.  Musculoskeletal: Negative for falls.  Skin: Negative for rash.  Neurological: Negative  for loss of consciousness and headaches.  Endo/Heme/Allergies: Negative for polydipsia.  Psychiatric/Behavioral: Positive for depression. Negative for suicidal ideas. The patient is nervous/anxious. The patient does not have insomnia.     Objective  BP 118/84  Pulse 68  Temp(Src) 98.2 F (36.8 C) (Oral)  Ht 5\' 6"  (1.676 m)  Wt 293 lb (132.904 kg)  BMI 47.31 kg/m2  SpO2 98%  Physical Exam  Physical Exam  Constitutional: She is oriented to person, place, and time and well-developed, well-nourished, and in no distress. No distress.  HENT:  Head: Normocephalic and atraumatic.  Eyes: Conjunctivae are normal.  Neck: Neck supple. No thyromegaly present.  Cardiovascular: Normal rate and regular rhythm.  Exam reveals no gallop.   No murmur heard. Pulmonary/Chest: Effort normal and breath sounds normal. She has no wheezes.  Abdominal: She exhibits no distension and no mass.  Musculoskeletal: She exhibits no edema.  Lymphadenopathy:    She has no cervical adenopathy.  Neurological: She is alert and oriented to person, place, and time.  Skin: Skin is warm and dry. No rash noted. She is not diaphoretic.  Psychiatric: Memory, affect and judgment normal.    Lab Results  Component Value Date   TSH 0.733 06/16/2011   Lab Results  Component Value Date   WBC 7.9 09/24/2010   HGB 12.7 09/24/2010   HCT 37.8 09/24/2010   MCV 87.6 09/24/2010   PLT 256.0 09/24/2010   Lab Results  Component Value Date   CREATININE 0.92 06/16/2011   BUN 19 06/16/2011   NA 139 06/16/2011   K 4.3 06/16/2011   CL 104 06/16/2011   CO2 27 06/16/2011   Lab Results  Component Value Date   ALT 18 06/16/2011   AST 25 06/16/2011   ALKPHOS 103 06/16/2011   BILITOT 0.3 06/16/2011   Lab Results  Component Value Date   CHOL 185 09/24/2010   Lab Results  Component Value Date   HDL 48.70 09/24/2010   Lab Results  Component Value Date   LDLCALC 125* 09/24/2010   Lab Results  Component Value Date   TRIG 56.0 09/24/2010    Lab Results  Component Value Date   CHOLHDL 4 09/24/2010     Assessment & Plan  Hypertension Well controlled npo changes today  Multiple sclerosis Has been on Gilenya since January and says she's felt tired and woozy almost on ever since she started it. Also feels heartburn is significantly worsened. Does not feel she's had any benefit from the medication. Will discuss with Dr. Rebecka Apley next week at her appointment  Esophageal reflux Worsening. Started on omeprazole and Carafate due to the burning. Referred for upper  endoscopy and is also in need of a screening colonoscopy. Avoid offending foods and H Pylori is checked today.  Depression Worsening, will increase the Sertraline to 50 mg daily and reassess at next visit.

## 2012-12-06 NOTE — Assessment & Plan Note (Signed)
Has been on Gilenya since January and says she's felt tired and woozy almost on ever since she started it. Also feels heartburn is significantly worsened. Does not feel she's had any benefit from the medication. Will discuss with Dr. Rebecka Apley next week at her appointment

## 2012-12-12 ENCOUNTER — Encounter: Payer: Self-pay | Admitting: Gastroenterology

## 2012-12-12 MED ORDER — AMOXICILLIN 500 MG PO TABS: 1000.0000 mg | ORAL_TABLET | Freq: Two times a day (BID) | ORAL | Status: DC

## 2012-12-12 MED ORDER — OMEPRAZOLE 20 MG PO CPDR: 20.0000 mg | DELAYED_RELEASE_CAPSULE | Freq: Two times a day (BID) | ORAL | Status: DC

## 2012-12-12 MED ORDER — CLARITHROMYCIN 500 MG PO TABS: 500.0000 mg | ORAL_TABLET | Freq: Two times a day (BID) | ORAL | Status: DC

## 2012-12-12 NOTE — Addendum Note (Signed)
Addended by: Court Joy on: 12/12/2012 05:39 PM   Modules accepted: Orders

## 2012-12-22 ENCOUNTER — Encounter: Payer: Self-pay | Admitting: Gastroenterology

## 2012-12-22 ENCOUNTER — Ambulatory Visit (INDEPENDENT_AMBULATORY_CARE_PROVIDER_SITE_OTHER): Payer: Medicare Other | Admitting: Gastroenterology

## 2012-12-22 VITALS — BP 138/90 | HR 75 | Ht 64.5 in | Wt 292.0 lb

## 2012-12-22 DIAGNOSIS — K219 Gastro-esophageal reflux disease without esophagitis: Secondary | ICD-10-CM

## 2012-12-22 MED ORDER — DEXLANSOPRAZOLE 60 MG PO CPDR: 60.0000 mg | DELAYED_RELEASE_CAPSULE | Freq: Every day | ORAL | Status: DC

## 2012-12-22 NOTE — Patient Instructions (Addendum)
Diet for Gastroesophageal Reflux Disease, Adult Reflux (acid reflux) is when acid from your stomach flows up into the esophagus. When acid comes in contact with the esophagus, the acid causes irritation and soreness (inflammation) in the esophagus. When reflux happens often or so severely that it causes damage to the esophagus, it is called gastroesophageal reflux disease (GERD). Nutrition therapy can help ease the discomfort of GERD. FOODS OR DRINKS TO AVOID OR LIMIT  Smoking or chewing tobacco. Nicotine is one of the most potent stimulants to acid production in the gastrointestinal tract.  Caffeinated and decaffeinated coffee and black tea.  Regular or low-calorie carbonated beverages or energy drinks (caffeine-free carbonated beverages are allowed).   Strong spices, such as black pepper, white pepper, red pepper, cayenne, curry powder, and chili powder.  Peppermint or spearmint.  Chocolate.  High-fat foods, including meats and fried foods. Extra added fats including oils, butter, salad dressings, and nuts. Limit these to less than 8 tsp per day.  Fruits and vegetables if they are not tolerated, such as citrus fruits or tomatoes.  Alcohol.  Any food that seems to aggravate your condition. If you have questions regarding your diet, call your caregiver or a registered dietitian. OTHER THINGS THAT MAY HELP GERD INCLUDE:   Eating your meals slowly, in a relaxed setting.  Eating 5 to 6 small meals per day instead of 3 large meals.  Eliminating food for a period of time if it causes distress.  Not lying down until 3 hours after eating a meal.  Keeping the head of your bed raised 6 to 9 inches (15 to 23 cm) by using a foam wedge or blocks under the legs of the bed. Lying flat may make symptoms worse.  Being physically active. Weight loss may be helpful in reducing reflux in overweight or obese adults.  Wear loose fitting clothing EXAMPLE MEAL PLAN This meal plan is approximately  2,000 calories based on ChooseMyPlate.gov meal planning guidelines. Breakfast   cup cooked oatmeal.  1 cup strawberries.  1 cup low-fat milk.  1 oz almonds. Snack  1 cup cucumber slices.  6 oz yogurt (made from low-fat or fat-free milk). Lunch  2 slice whole-wheat bread.  2 oz sliced turkey.  2 tsp mayonnaise.  1 cup blueberries.  1 cup snap peas. Snack  6 whole-wheat crackers.  1 oz string cheese. Dinner   cup brown rice.  1 cup mixed veggies.  1 tsp olive oil.  3 oz grilled fish. Document Released: 05/18/2005 Document Revised: 08/10/2011 Document Reviewed: 04/03/2011 ExitCare Patient Information 2014 [employer]. Gastroesophageal Reflux Disease, Adult Gastroesophageal reflux disease (GERD) happens when acid from your stomach flows up into the esophagus. When acid comes in contact with the esophagus, the acid causes soreness (inflammation) in the esophagus. Over time, GERD may create small holes (ulcers) in the lining of the esophagus. CAUSES   Increased body weight. This puts pressure on the stomach, making acid rise from the stomach into the esophagus.  Smoking. This increases acid production in the stomach.  Drinking alcohol. This causes decreased pressure in the lower esophageal sphincter (valve or ring of muscle between the esophagus and stomach), allowing acid from the stomach into the esophagus.  Late evening meals and a full stomach. This increases pressure and acid production in the stomach.  A malformed lower esophageal sphincter. Sometimes, no cause is found. SYMPTOMS   Burning pain in the lower part of the mid-chest behind the breastbone and in the mid-stomach area. This may   occur twice a week or more often.  Trouble swallowing.  Sore throat.  Dry cough.  Asthma-like symptoms including chest tightness, shortness of breath, or wheezing. DIAGNOSIS  Your caregiver may be able to diagnose GERD based on your symptoms. In some cases,  X-rays and other tests may be done to check for complications or to check the condition of your stomach and esophagus. TREATMENT  Your caregiver may recommend over-the-counter or prescription medicines to help decrease acid production. Ask your caregiver before starting or adding any new medicines.  HOME CARE INSTRUCTIONS   Change the factors that you can control. Ask your caregiver for guidance concerning weight loss, quitting smoking, and alcohol consumption.  Avoid foods and drinks that make your symptoms worse, such as:  Caffeine or alcoholic drinks.  Chocolate.  Peppermint or mint flavorings.  Garlic and onions.  Spicy foods.  Citrus fruits, such as oranges, lemons, or limes.  Tomato-based foods such as sauce, chili, salsa, and pizza.  Fried and fatty foods.  Avoid lying down for the 3 hours prior to your bedtime or prior to taking a nap.  Eat small, frequent meals instead of large meals.  Wear loose-fitting clothing. Do not wear anything tight around your waist that causes pressure on your stomach.  Raise the head of your bed 6 to 8 inches with wood blocks to help you sleep. Extra pillows will not help.  Only take over-the-counter or prescription medicines for pain, discomfort, or fever as directed by your caregiver.  Do not take aspirin, ibuprofen, or other nonsteroidal anti-inflammatory drugs (NSAIDs). SEEK IMMEDIATE MEDICAL CARE IF:   You have pain in your arms, neck, jaw, teeth, or back.  Your pain increases or changes in intensity or duration.  You develop nausea, vomiting, or sweating (diaphoresis).  You develop shortness of breath, or you faint.  Your vomit is green, yellow, black, or looks like coffee grounds or blood.  Your stool is red, bloody, or black. These symptoms could be signs of other problems, such as heart disease, gastric bleeding, or esophageal bleeding. MAKE SURE YOU:   Understand these instructions.  Will watch your  condition.  Will get help right away if you are not doing well or get worse. Document Released: 02/25/2005 Document Revised: 08/10/2011 Document Reviewed: 12/05/2010 ExitCare Patient Information 2014 [employer].  

## 2012-12-22 NOTE — Progress Notes (Signed)
History of Present Illness: Pleasant 57 year old Afro-American female referred for evaluation of reflux. Despite taking combinations of omeprazole she is complaining of frequent, severe pyrosis. Symptoms may occur anytime during the day or  night. She denies dysphagia, sore throat or coughing. Burning is often accompanied by nausea. She's on no gastric irritants including nonsteroidals. She claims that symptoms clearly worsened when she started Gilenya.  The patient had a screening colonoscopy 7 years ago that apparently was normal.    Past Medical History  Diagnosis Date  . Depression   . Allergy   . GERD (gastroesophageal reflux disease)   . Hypertension   . Thyroid disease   . MS (multiple sclerosis)   . Sinusitis 08/08/2012  . Other and unspecified hyperlipidemia 08/08/2012  . Allergic state 08/08/2012  . Esophageal reflux 08/08/2012   Past Surgical History  Procedure Laterality Date  . Tonsillectomy    . Tmj arthroscopy    . Hysteroscopy  02, 04, 2008    with D&C   family history includes Emphysema in her mother and Heart disease in her father and mother. Current Outpatient Prescriptions  Medication Sig Dispense Refill  . amoxicillin (AMOXIL) 500 MG tablet Take 2 tablets (1,000 mg total) by mouth 2 (two) times daily. X 10 days  40 tablet  0  . Armodafinil (NUVIGIL) 150 MG tablet Take 1 tablet (150 mg total) by mouth daily.  30 tablet  5  . cetirizine (ZYRTEC) 10 MG tablet Take 10 mg by mouth daily.      . Cholecalciferol (VITAMIN D PO) Take by mouth.      . clarithromycin (BIAXIN) 500 MG tablet Take 1 tablet (500 mg total) by mouth 2 (two) times daily. X 10 days  20 tablet  0  . Docusate Calcium (STOOL SOFTENER PO) Take by mouth daily as needed.      . Fingolimod HCl (GILENYA) 0.5 MG CAPS Take by mouth daily.      . furosemide (LASIX) 20 MG tablet One po qam prn leg swelling  90 tablet  1  . lisinopril-hydrochlorothiazide (PRINZIDE,ZESTORETIC) 20-25 MG per tablet Take 1 tablet  by mouth daily.  90 tablet  1  . meloxicam (MOBIC) 15 MG tablet Take 1 tablet (15 mg total) by mouth daily as needed for pain. With food  30 tablet  5  . Multiple Vitamin (MULTIVITAMIN) tablet Take 1 tablet by mouth daily.      Marland Kitchen omeprazole (PRILOSEC) 20 MG capsule Take 1 capsule (20 mg total) by mouth 2 (two) times daily. Take bid X 10 days  20 capsule  0  . omeprazole (PRILOSEC) 40 MG capsule Take 1 capsule (40 mg total) by mouth daily.  30 capsule  3  . oxybutynin (DITROPAN) 5 MG tablet Take 1 tablet (5 mg total) by mouth 2 (two) times daily.  60 tablet  5  . sertraline (ZOLOFT) 50 MG tablet Take 1 tablet (50 mg total) by mouth daily.  30 tablet  3  . [DISCONTINUED] oxybutynin (DITROPAN) 5 MG tablet Take 5 mg by mouth 2 (two) times daily.         No current facility-administered medications for this visit.   Allergies as of 12/22/2012  . (No Known Allergies)    reports that she has never smoked. She has never used smokeless tobacco. She reports that she does not drink alcohol or use illicit drugs.     Review of Systems: Pertinent positive and negative review of systems were noted in the above HPI  section. All other review of systems were otherwise negative.  Vital signs were reviewed in today's medical record Physical Exam: General: Well developed , well nourished, no acute distress Skin: anicteric Head: Normocephalic and atraumatic Eyes:  sclerae anicteric, EOMI Ears: Normal auditory acuity Mouth: No deformity or lesions Neck: Supple, no masses or thyromegaly Lungs: Clear throughout to auscultation Heart: Regular rate and rhythm; no murmurs, rubs or bruits Abdomen: Soft, non tender and non distended. No masses, hepatosplenomegaly or hernias noted. Normal Bowel sounds. There is no succussion splash Rectal:deferred Musculoskeletal: Symmetrical with no gross deformities  Skin: No lesions on visible extremities Pulses:  Normal pulses noted Extremities: No clubbing, cyanosis,  edema or deformities noted Neurological: Alert oriented x 4, grossly nonfocal Cervical Nodes:  No significant cervical adenopathy Inguinal Nodes: No significant inguinal adenopathy Psychological:  Alert and cooperative. Normal mood and affect

## 2012-12-22 NOTE — Assessment & Plan Note (Signed)
Patient is symptomatic despite therapy with high-dose omeprazole.  I suspect nausea is related to this as well  Recommendations #1 trial of dexilant 1-2 times a day #2 antireflux measures #3 upper endoscopy #4 if patient continues symptomatic I will obtain a gastric emptying scan

## 2012-12-23 ENCOUNTER — Encounter: Payer: Self-pay | Admitting: Gastroenterology

## 2013-02-01 ENCOUNTER — Encounter: Payer: Self-pay | Admitting: Gastroenterology

## 2013-02-01 ENCOUNTER — Ambulatory Visit (AMBULATORY_SURGERY_CENTER): Payer: Medicare Other | Admitting: Gastroenterology

## 2013-02-01 VITALS — BP 167/89 | HR 66 | Temp 98.5°F | Resp 14 | Ht 64.5 in | Wt 292.0 lb

## 2013-02-01 DIAGNOSIS — K2970 Gastritis, unspecified, without bleeding: Secondary | ICD-10-CM

## 2013-02-01 DIAGNOSIS — A048 Other specified bacterial intestinal infections: Secondary | ICD-10-CM

## 2013-02-01 DIAGNOSIS — K2990 Gastroduodenitis, unspecified, without bleeding: Secondary | ICD-10-CM

## 2013-02-01 DIAGNOSIS — K219 Gastro-esophageal reflux disease without esophagitis: Secondary | ICD-10-CM

## 2013-02-01 MED ORDER — SODIUM CHLORIDE 0.9 % IV SOLN: 500.0000 mL | INTRAVENOUS | Status: DC

## 2013-02-01 NOTE — Progress Notes (Signed)
Back to room 2, Started recovery in room 2, all monitors on per asa, vss, bbs=clear

## 2013-02-01 NOTE — Progress Notes (Signed)
Patient did not experience any of the following events: a burn prior to discharge; a fall within the facility; wrong site/side/patient/procedure/implant event; or a hospital transfer or hospital admission upon discharge from the facility. (G8907) Patient did not have preoperative order for IV antibiotic SSI prophylaxis. (G8918)  

## 2013-02-01 NOTE — Progress Notes (Signed)
Called to room to assist during endoscopic procedure.  Patient ID and intended procedure confirmed with present staff. Received instructions for my participation in the procedure from the performing physician.  

## 2013-02-01 NOTE — Patient Instructions (Addendum)
YOU HAD AN ENDOSCOPIC PROCEDURE TODAY AT THE [facility]: Refer to the procedure report that was given to you for any specific questions about what was found during the examination.  If the procedure report does not answer your questions, please call your gastroenterologist to clarify.  If you requested that your care partner not be given the details of your procedure findings, then the procedure report has been included in a sealed envelope for you to review at your convenience later.  YOU SHOULD EXPECT: Some feelings of bloating in the abdomen. Passage of more gas than usual.  Walking can help get rid of the air that was put into your GI tract during the procedure and reduce the bloating  DIET: Your first meal following the procedure should be a light meal and then it is ok to progress to your normal diet.  A half-sandwich or bowl of soup is an example of a good first meal.  Heavy or fried foods are harder to digest and may make you feel nauseous or bloated.  Likewise meals heavy in dairy and vegetables can cause extra gas to form and this can also increase the bloating.  Drink plenty of fluids but you should avoid alcoholic beverages for 24 hours.  ACTIVITY: Your care partner should take you home directly after the procedure.  You should plan to take it easy, moving slowly for the rest of the day.  You can resume normal activity the day after the procedure however you should NOT DRIVE or use heavy machinery for 24 hours (because of the sedation medicines used during the test).    SYMPTOMS TO REPORT IMMEDIATELY: A gastroenterologist can be reached at any hour.  During normal business hours, 8:30 AM to 5:00 PM Monday through Friday, call [phone].  After hours and on weekends, please call the GI answering service at [phone] who will take a message and have the physician on call contact you.    Following upper endoscopy (EGD)  Vomiting of blood or coffee ground material  New  chest pain or pain under the shoulder blades  Painful or persistently difficult swallowing  New shortness of breath  Fever of 100F or higher  Black, tarry-looking stools  FOLLOW UP: If any biopsies were taken you will be contacted by phone or by letter within the next 1-3 weeks.  Call your gastroenterologist if you have not heard about the biopsies in 3 weeks.  Our staff will call the home number listed on your records the next business day following your procedure to check on you and address any questions or concerns that you may have at that time regarding the information given to you following your procedure. This is a courtesy call and so if there is no answer at the home number and we have not heard from you through the emergency physician on call, we will assume that you have returned to your regular daily activities without incident.  SIGNATURES/CONFIDENTIALITY: You and/or your care partner have signed paperwork which will be entered into your electronic medical record.  These signatures attest to the fact that that the information above on your After Visit Summary has been reviewed and is understood.  Full responsibility of the confidentiality of this discharge information lies with you and/or your care-partner.  Please try Dexilant- 1 to 2 tablets daily- 30 minutes before your breakfast and if needed 30 minutes before your dinner Dr. Marzetta Board office nurse will call you to set up follow up appointment  for 3-4- weeks

## 2013-02-01 NOTE — Op Note (Signed)
[facility] [address] [place], [address]   ENDOSCOPY PROCEDURE REPORT  PATIENT: Sydney Pace, Sydney Pace  MR#: [id number] BIRTHDATE: 31-Aug-1955 , 57  yrs. old GENDER: Female ENDOSCOPIST: Louis Meckel, MD REFERRED BY:  Reuel Derby, M.D. PROCEDURE DATE:  02/01/2013 PROCEDURE:  EGD w/ biopsy ASA CLASS:     Class II INDICATIONS:  Heartburn. MEDICATIONS: MAC sedation, administered by CRNA and propofol (Diprivan) 150mg  IV TOPICAL ANESTHETIC:  DESCRIPTION OF PROCEDURE: After the risks benefits and alternatives of the procedure were thoroughly explained, informed consent was obtained.  The LB [id number] endoscope was introduced through the mouth and advanced to the third portion of the duodenum. Without limitations.  The instrument was slowly withdrawn as the mucosa was fully examined.      In the gastric cardia fundus there were multiple areas of punctate submucosal hemorrhage.  There is mild edema.  Biopsies were taken. In the gastric cardia fundus there were multiple areas of punctate submucosal hemorrhage.  There is mild edema.  Biopsies were taken. The remainder of the upper endoscopy exam was otherwise normal. Retroflexed views revealed no abnormalities.     The scope was then withdrawn from the patient and the procedure completed.  COMPLICATIONS: There were no complications. ENDOSCOPIC IMPRESSION: 1.  nonspecific gastritis  RECOMMENDATIONS: trial of dexilant 1-2 tabs daily Office visit 3-4 weeks REPEAT EXAM:  eSigned:  Louis Meckel, MD 02/01/2013 3:41 PM   CC:

## 2013-02-01 NOTE — Progress Notes (Signed)
Report to pacu rn, vss, bbs=clear 

## 2013-02-02 ENCOUNTER — Telehealth: Payer: Self-pay | Admitting: *Deleted

## 2013-02-02 NOTE — Telephone Encounter (Signed)
  Follow up Call-  Call back number 02/01/2013  Post procedure Call Back phone  # [phone] cell  Permission to leave phone message Yes     Patient questions:  Do you have a fever, pain , or abdominal swelling? no Pain Score  0 *  Have you tolerated food without any problems? yes  Have you been able to return to your normal activities? yes  Do you have any questions about your discharge instructions: Diet   no Medications  no Follow up visit  no  Do you have questions or concerns about your Care? no  Actions: * If pain score is 4 or above: No action needed, pain <4.

## 2013-02-13 ENCOUNTER — Other Ambulatory Visit: Payer: Self-pay | Admitting: Gastroenterology

## 2013-02-13 ENCOUNTER — Telehealth: Payer: Self-pay | Admitting: Gastroenterology

## 2013-02-13 MED ORDER — AMOXICILL-CLARITHRO-LANSOPRAZ PO MISC: Freq: Two times a day (BID) | ORAL | Status: DC

## 2013-02-13 NOTE — Telephone Encounter (Signed)
Dr Charolotte Capuchin can not afford prevpac What do you want to send

## 2013-02-13 NOTE — Telephone Encounter (Signed)
helidac 1 dose qid x 10 days

## 2013-02-14 MED ORDER — METRONID-TETRACYC-BIS SUBSAL PO MISC: 1.0000 | Freq: Four times a day (QID) | ORAL | Status: DC

## 2013-02-14 NOTE — Telephone Encounter (Signed)
Contacted pt to inform her new med sent

## 2013-02-17 ENCOUNTER — Telehealth: Payer: Self-pay | Admitting: Gastroenterology

## 2013-02-17 NOTE — Telephone Encounter (Signed)
FYI Dr Arlyce Dice

## 2013-02-20 MED ORDER — BIS SUBCIT-METRONID-TETRACYC 140-125-125 MG PO CAPS: 3.0000 | ORAL_CAPSULE | Freq: Three times a day (TID) | ORAL | Status: DC

## 2013-02-20 NOTE — Telephone Encounter (Signed)
.  Sent in pylera to see if insurance would pay

## 2013-02-20 NOTE — Telephone Encounter (Signed)
See if she can afford pylera

## 2013-02-27 ENCOUNTER — Telehealth: Payer: Self-pay | Admitting: Family Medicine

## 2013-02-27 NOTE — Telephone Encounter (Signed)
This one was just a one time RX

## 2013-02-27 NOTE — Telephone Encounter (Signed)
Refill- prilosec 20mg  cap. Take one capsule by mouth twice daily for 10 days. Qty 20 last fill 7.14.2014

## 2013-02-28 ENCOUNTER — Encounter: Payer: Self-pay | Admitting: Gastroenterology

## 2013-02-28 ENCOUNTER — Ambulatory Visit (INDEPENDENT_AMBULATORY_CARE_PROVIDER_SITE_OTHER): Payer: Medicare Other | Admitting: Gastroenterology

## 2013-02-28 VITALS — BP 140/92 | HR 76 | Ht 65.0 in | Wt 292.5 lb

## 2013-02-28 DIAGNOSIS — K219 Gastro-esophageal reflux disease without esophagitis: Secondary | ICD-10-CM

## 2013-02-28 NOTE — Assessment & Plan Note (Addendum)
The patient had an excellent response to dexilant, lesser response to Prilosec.  I have given her samples and will request that her insurance company pay for dexilant.  She will complete Pylera for H. pylori

## 2013-02-28 NOTE — Patient Instructions (Signed)
We are giving you Dexilant samples today

## 2013-02-28 NOTE — Progress Notes (Signed)
History of Present Illness:  Sydney Pace has returned following upper endoscopy.  Exam demonstrated gastritis and biopsies showed H. pylori.  She's currently taking Pylera.  While she was taking dexilant her symptoms clearly improved.  She's having recurrent pyrosis with Prilosec.    Review of Systems: Pertinent positive and negative review of systems were noted in the above HPI section. All other review of systems were otherwise negative.    Current Medications, Allergies, Past Medical History, Past Surgical History, Family History and Social History were reviewed in [employer] electronic medical record  Vital signs were reviewed in today's medical record. Physical Exam: General: Well developed , well nourished, no acute distress

## 2013-03-06 ENCOUNTER — Encounter: Payer: Self-pay | Admitting: Family Medicine

## 2013-03-06 ENCOUNTER — Ambulatory Visit (INDEPENDENT_AMBULATORY_CARE_PROVIDER_SITE_OTHER): Payer: Medicare Other | Admitting: Family Medicine

## 2013-03-06 VITALS — BP 122/92 | HR 65 | Temp 98.1°F | Ht 66.0 in | Wt 291.0 lb

## 2013-03-06 DIAGNOSIS — I10 Essential (primary) hypertension: Secondary | ICD-10-CM

## 2013-03-06 DIAGNOSIS — F329 Major depressive disorder, single episode, unspecified: Secondary | ICD-10-CM

## 2013-03-06 DIAGNOSIS — Z23 Encounter for immunization: Secondary | ICD-10-CM

## 2013-03-06 DIAGNOSIS — F32A Depression, unspecified: Secondary | ICD-10-CM

## 2013-03-06 DIAGNOSIS — K219 Gastro-esophageal reflux disease without esophagitis: Secondary | ICD-10-CM

## 2013-03-06 DIAGNOSIS — G35 Multiple sclerosis: Secondary | ICD-10-CM

## 2013-03-06 NOTE — Progress Notes (Signed)
Patient ID: Sydney Pace, female   DOB: 27-Jul-1955, 57 y.o.   MRN: [id number] Sydney Pace [id number] 12/11/1955 03/06/2013      Progress Note-Follow Up  Subjective  Chief Complaint  Chief Complaint  Patient presents with  . Follow-up    3 month  . Injections    flu    HPI  Patient is a 57 year old female who is in today for followup. Overall she feels she's doing well. Her MS is stable. No recent illness. Her depression is manageable on sertraline. Denies chest pain, palpitations, shortness of breath, GI or GU concerns at this time. Taking medications as prescribed. Is excellent is controlling her reflux along with some dietary adjustments.  Past Medical History  Diagnosis Date  . Depression   . Allergy   . GERD (gastroesophageal reflux disease)   . Hypertension   . Thyroid disease   . MS (multiple sclerosis)   . Sinusitis 08/08/2012  . Other and unspecified hyperlipidemia 08/08/2012  . Allergic state 08/08/2012  . Esophageal reflux 08/08/2012  . Arthritis     Past Surgical History  Procedure Laterality Date  . Tonsillectomy    . Tmj arthroscopy    . Hysteroscopy  02, 04, 2008    with D&C    Family History  Problem Relation Age of Onset  . Heart disease Mother     pacemaker  . Emphysema Mother   . Heart disease Father   . Colon cancer Neg Hx   . Esophageal cancer Neg Hx   . Rectal cancer Neg Hx   . Stomach cancer Neg Hx     History   Social History  . Marital Status: Single    Spouse Name: N/A    Number of Children: N/A  . Years of Education: N/A   Occupational History  . Not on file.   Social History Main Topics  . Smoking status: Never Smoker   . Smokeless tobacco: Never Used  . Alcohol Use: No     Comment: occassional  . Drug Use: No  . Sexual Activity: Not Currently   Other Topics Concern  . Not on file   Social History Narrative  . No narrative on file    Current Outpatient Prescriptions on File Prior to Visit  Medication Sig  Dispense Refill  . Armodafinil (NUVIGIL) 150 MG tablet Take 1 tablet (150 mg total) by mouth daily.  30 tablet  5  . bismuth-metronidazole-tetracycline (PYLERA) 140-125-125 MG per capsule Take 3 capsules by mouth 4 (four) times daily -  before meals and at bedtime.  120 capsule  0  . calcium carbonate 200 MG capsule Take 250 mg by mouth 2 (two) times daily with a meal.      . cetirizine (ZYRTEC) 10 MG tablet Take 10 mg by mouth daily.      . Cholecalciferol (VITAMIN D PO) Take by mouth.      . dexlansoprazole (DEXILANT) 60 MG capsule Take 1 capsule (60 mg total) by mouth daily.  30 capsule  3  . Docusate Calcium (STOOL SOFTENER PO) Take by mouth daily as needed.      . Fingolimod HCl (GILENYA) 0.5 MG CAPS Take by mouth daily.      . furosemide (LASIX) 20 MG tablet One po qam prn leg swelling  90 tablet  1  . lisinopril-hydrochlorothiazide (PRINZIDE,ZESTORETIC) 20-25 MG per tablet Take 1 tablet by mouth daily.  90 tablet  1  . meloxicam (MOBIC) 15 MG tablet Take 1 tablet (15 mg  total) by mouth daily as needed for pain. With food  30 tablet  5  . Metronid-Tetracyc-Bis Subsal MISC Take 1 each by mouth 4 (four) times daily.  40 each  0  . Multiple Vitamin (MULTIVITAMIN) tablet Take 1 tablet by mouth daily.      Marland Kitchen omeprazole (PRILOSEC) 40 MG capsule Take 40 mg by mouth every other day.      . oxybutynin (DITROPAN) 5 MG tablet Take 1 tablet (5 mg total) by mouth 2 (two) times daily.  60 tablet  5  . sertraline (ZOLOFT) 50 MG tablet Take 1 tablet (50 mg total) by mouth daily.  30 tablet  3  . [DISCONTINUED] oxybutynin (DITROPAN) 5 MG tablet Take 5 mg by mouth 2 (two) times daily.         No current facility-administered medications on file prior to visit.    No Known Allergies  Review of Systems  Review of Systems  Constitutional: Positive for malaise/fatigue. Negative for fever.  HENT: Negative for congestion.   Eyes: Negative for discharge.  Respiratory: Negative for shortness of breath.    Cardiovascular: Negative for chest pain, palpitations and leg swelling.  Gastrointestinal: Positive for heartburn and abdominal pain. Negative for nausea and diarrhea.  Genitourinary: Negative for dysuria.  Musculoskeletal: Negative for falls.  Skin: Negative for rash.  Neurological: Negative for loss of consciousness and headaches.  Endo/Heme/Allergies: Negative for polydipsia.  Psychiatric/Behavioral: Negative for depression and suicidal ideas. The patient is not nervous/anxious and does not have insomnia.     Objective  BP 122/92  Pulse 65  Temp(Src) 98.1 F (36.7 C) (Oral)  Ht 5\' 6"  (1.676 m)  Wt 291 lb (131.997 kg)  BMI 46.99 kg/m2  SpO2 97%  Physical Exam  Physical Exam  Constitutional: She is oriented to person, place, and time and well-developed, well-nourished, and in no distress. No distress.  HENT:  Head: Normocephalic and atraumatic.  Eyes: Conjunctivae are normal.  Neck: Neck supple. No thyromegaly present.  Cardiovascular: Normal rate, regular rhythm and normal heart sounds.   No murmur heard. Pulmonary/Chest: Effort normal and breath sounds normal. She has no wheezes.  Abdominal: She exhibits no distension and no mass.  Musculoskeletal: She exhibits no edema.  Lymphadenopathy:    She has no cervical adenopathy.  Neurological: She is alert and oriented to person, place, and time.  Skin: Skin is warm and dry. No rash noted. She is not diaphoretic.  Psychiatric: Memory, affect and judgment normal.    Lab Results  Component Value Date   TSH 0.733 06/16/2011   Lab Results  Component Value Date   WBC 7.9 09/24/2010   HGB 12.7 09/24/2010   HCT 37.8 09/24/2010   MCV 87.6 09/24/2010   PLT 256.0 09/24/2010   Lab Results  Component Value Date   CREATININE 0.92 06/16/2011   BUN 19 06/16/2011   NA 139 06/16/2011   K 4.3 06/16/2011   CL 104 06/16/2011   CO2 27 06/16/2011   Lab Results  Component Value Date   ALT 18 06/16/2011   AST 25 06/16/2011   ALKPHOS 103  06/16/2011   BILITOT 0.3 06/16/2011   Lab Results  Component Value Date   CHOL 185 09/24/2010   Lab Results  Component Value Date   HDL 48.70 09/24/2010   Lab Results  Component Value Date   LDLCALC 125* 09/24/2010   Lab Results  Component Value Date   TRIG 56.0 09/24/2010   Lab Results  Component Value Date  CHOLHDL 4 09/24/2010     Assessment & Plan Multiple sclerosis Doing well no recent progression of disease.   Hypertension Well controlled on current doses.   Esophageal reflux Responding well to Dexilant, avoid offending foods  Depression Doing well on Sertraline, continue the same

## 2013-03-06 NOTE — Patient Instructions (Addendum)

## 2013-03-11 NOTE — Assessment & Plan Note (Signed)
Responding well to Dexilant, avoid offending foods

## 2013-03-11 NOTE — Assessment & Plan Note (Signed)
Doing well no recent progression of disease.

## 2013-03-11 NOTE — Assessment & Plan Note (Signed)
Doing well on Sertraline, continue the same

## 2013-03-11 NOTE — Assessment & Plan Note (Signed)
Well controlled on current doses.

## 2013-03-27 ENCOUNTER — Other Ambulatory Visit: Payer: Self-pay | Admitting: Gynecology

## 2013-03-27 DIAGNOSIS — Z1231 Encounter for screening mammogram for malignant neoplasm of breast: Secondary | ICD-10-CM

## 2013-04-14 ENCOUNTER — Ambulatory Visit (HOSPITAL_COMMUNITY)
Admission: RE | Admit: 2013-04-14 | Discharge: 2013-04-14 | Disposition: A | Discharge status: Home / Self Care | Payer: Medicare Other | Source: Ambulatory Visit | Attending: Gynecology | Admitting: Gynecology

## 2013-04-14 DIAGNOSIS — Z1231 Encounter for screening mammogram for malignant neoplasm of breast: Secondary | ICD-10-CM

## 2013-04-29 ENCOUNTER — Other Ambulatory Visit: Payer: Self-pay | Admitting: Family Medicine

## 2013-05-01 MED ORDER — SERTRALINE HCL 50 MG PO TABS: ORAL_TABLET | ORAL | Status: DC

## 2013-05-01 NOTE — Telephone Encounter (Signed)
eScribe request for refill on Sertraline Last filled - 07.08.14, #30x3 Last AEX - 10.06.14 Next AEX - 3 Months [facility] protocol[facility]S

## 2013-05-19 ENCOUNTER — Ambulatory Visit (INDEPENDENT_AMBULATORY_CARE_PROVIDER_SITE_OTHER): Payer: Medicare Other | Admitting: Gynecology

## 2013-05-19 ENCOUNTER — Encounter: Payer: Medicare Other | Admitting: Gynecology

## 2013-05-19 ENCOUNTER — Encounter: Payer: Self-pay | Admitting: Gynecology

## 2013-05-19 VITALS — BP 126/82 | Ht 65.0 in | Wt 297.0 lb

## 2013-05-19 DIAGNOSIS — Z01419 Encounter for gynecological examination (general) (routine) without abnormal findings: Secondary | ICD-10-CM

## 2013-05-19 DIAGNOSIS — D251 Intramural leiomyoma of uterus: Secondary | ICD-10-CM

## 2013-05-19 LAB — URINALYSIS W MICROSCOPIC + REFLEX CULTURE
Casts: NONE SEEN
Glucose, UA: NEGATIVE mg/dL
Hgb urine dipstick: NEGATIVE
Leukocytes, UA: NEGATIVE
RBC / HPF: NONE SEEN RBC/hpf (ref <3)
pH: 5.5 (ref 5.0–8.0)

## 2013-05-19 NOTE — Patient Instructions (Signed)
Return the stool blood check kit Check with your gastroenterologist as to when you were to repeat your colonoscopy. Followup in one year for annual exam.

## 2013-05-19 NOTE — Progress Notes (Signed)
Sydney Pace 10-13-1955 [id number]        57 y.o.  G3P0030 for annual exam.  Doing well without complaints.  Past medical history,surgical history, problem list, medications, allergies, family history and social history were all reviewed and documented in the EPIC chart.  ROS:  Performed and pertinent positives and negatives are included in the history, assessment and plan .  Exam: Kim assistant Filed Vitals:   05/19/13 0859  BP: 126/82  Height: 5\' 5"  (1.651 m)  Weight: 297 lb (134.718 kg)   General appearance  Normal Skin grossly normal Head/Neck normal with no cervical or supraclavicular adenopathy thyroid normal Lungs  clear Cardiac RR, without RMG Abdominal  soft, nontender, without masses, organomegaly or hernia Breasts  examined lying and sitting without masses, retractions, discharge or axillary adenopathy. Pelvic  Ext/BUS/vagina  Normal with mild atrophic changes  Cervix  Normal with mild atrophic changes  Uterus  axial to anteverted, normal size, shape and contour, midline and mobile nontender   Adnexa  Without masses or tenderness    Anus and perineum  Normal   Rectovaginal  Normal sphincter tone without palpated masses or tenderness.    Assessment/Plan:  57 y.o. G70P0030 female for annual exam.   1. Postmenopausal. No bleeding. Without significant symptoms of hot flushes, night sweats, vaginal dryness. Is not sexually active. Continue to monitor. Call if any vaginal bleeding. 2. History of leiomyoma. Uterus palpates normal grossly. Continue to follow with annual exams. 3. Pap smear/HPV negative 2013. The Pap smear done today. No history of significant abnormal Pap smears. Repeat at 3-5 year interval. 4. Mammography 04/2013. Continue with annual mammography. SBE monthly reviewed. 5. Colonoscopy thought to be due next year. She's going to call her gastroenterologist to check. OC Light kit given. 6. DEXA 2014 normal. Repeat at five-year interval. Increase calcium vitamin  D reviewed. 7. Health maintenance. No blood work done as this is all done through her primary physician's office. Followup one year, sooner as needed.   Note: This document was prepared with digital dictation and possible smart phrase technology. Any transcriptional errors that result from this process are unintentional.   Dara Lords MD, 9:33 AM 05/19/2013

## 2013-06-04 ENCOUNTER — Other Ambulatory Visit: Payer: Self-pay | Admitting: Family Medicine

## 2013-06-06 ENCOUNTER — Encounter: Payer: Medicare Other | Admitting: Family Medicine

## 2013-06-14 ENCOUNTER — Other Ambulatory Visit: Payer: Self-pay | Admitting: Family Medicine

## 2013-07-12 ENCOUNTER — Other Ambulatory Visit: Payer: Self-pay | Admitting: Family Medicine

## 2013-08-09 ENCOUNTER — Other Ambulatory Visit: Payer: Self-pay | Admitting: Family Medicine

## 2013-08-22 ENCOUNTER — Telehealth: Payer: Self-pay | Admitting: Family Medicine

## 2013-08-22 ENCOUNTER — Ambulatory Visit (INDEPENDENT_AMBULATORY_CARE_PROVIDER_SITE_OTHER): Payer: Medicare Other | Admitting: Family Medicine

## 2013-08-22 ENCOUNTER — Encounter: Payer: Self-pay | Admitting: Family Medicine

## 2013-08-22 VITALS — BP 130/90 | HR 67 | Temp 97.7°F | Ht 65.0 in | Wt 293.1 lb

## 2013-08-22 DIAGNOSIS — K219 Gastro-esophageal reflux disease without esophagitis: Secondary | ICD-10-CM

## 2013-08-22 DIAGNOSIS — N3281 Overactive bladder: Secondary | ICD-10-CM

## 2013-08-22 DIAGNOSIS — Z0000 Encounter for general adult medical examination without abnormal findings: Secondary | ICD-10-CM

## 2013-08-22 DIAGNOSIS — G35D Multiple sclerosis, unspecified: Secondary | ICD-10-CM

## 2013-08-22 DIAGNOSIS — G35 Multiple sclerosis: Secondary | ICD-10-CM

## 2013-08-22 DIAGNOSIS — E669 Obesity, unspecified: Secondary | ICD-10-CM

## 2013-08-22 DIAGNOSIS — Z23 Encounter for immunization: Secondary | ICD-10-CM

## 2013-08-22 DIAGNOSIS — E785 Hyperlipidemia, unspecified: Secondary | ICD-10-CM

## 2013-08-22 DIAGNOSIS — I10 Essential (primary) hypertension: Secondary | ICD-10-CM

## 2013-08-22 LAB — CBC
HCT: 37.6 % (ref 36.0–46.0)
HEMOGLOBIN: 13.1 g/dL (ref 12.0–15.0)
MCH: 29.8 pg (ref 26.0–34.0)
MCHC: 34.8 g/dL (ref 30.0–36.0)
MCV: 85.6 fL (ref 78.0–100.0)
Platelets: 278 10*3/uL (ref 150–400)
RBC: 4.39 MIL/uL (ref 3.87–5.11)
RDW: 14.6 % (ref 11.5–15.5)
WBC: 5.8 10*3/uL (ref 4.0–10.5)

## 2013-08-22 LAB — LIPID PANEL
Cholesterol: 189 mg/dL (ref 0–200)
HDL: 54 mg/dL (ref >39)
LDL CALC: 121 mg/dL — AB (ref 0–99)
Total CHOL/HDL Ratio: 3.5 Ratio
Triglycerides: 70 mg/dL (ref <150)
VLDL: 14 mg/dL (ref 0–40)

## 2013-08-22 LAB — HEPATIC FUNCTION PANEL
ALBUMIN: 4.1 g/dL (ref 3.5–5.2)
ALT: 18 U/L (ref 0–35)
AST: 22 U/L (ref 0–37)
Alkaline Phosphatase: 83 U/L (ref 39–117)
Bilirubin, Direct: 0.1 mg/dL (ref 0.0–0.3)
Indirect Bilirubin: 0.4 mg/dL (ref 0.2–1.2)
TOTAL PROTEIN: 7.4 g/dL (ref 6.0–8.3)
Total Bilirubin: 0.5 mg/dL (ref 0.2–1.2)

## 2013-08-22 LAB — RENAL FUNCTION PANEL
ALBUMIN: 4.1 g/dL (ref 3.5–5.2)
BUN: 16 mg/dL (ref 6–23)
CALCIUM: 10.1 mg/dL (ref 8.4–10.5)
CHLORIDE: 101 meq/L (ref 96–112)
CO2: 31 mEq/L (ref 19–32)
Creat: 0.96 mg/dL (ref 0.50–1.10)
Glucose, Bld: 93 mg/dL (ref 70–99)
PHOSPHORUS: 2.8 mg/dL (ref 2.3–4.6)
Potassium: 4.4 mEq/L (ref 3.5–5.3)
Sodium: 139 mEq/L (ref 135–145)

## 2013-08-22 NOTE — Telephone Encounter (Signed)
Relevant patient education assigned to patient using Emmi. ° °

## 2013-08-22 NOTE — Progress Notes (Signed)
Pre visit review using our clinic review tool, if applicable. No additional management support is needed unless otherwise documented below in the visit note. 

## 2013-08-22 NOTE — Patient Instructions (Signed)
DASH Diet The DASH diet stands for "Dietary Approaches to Stop Hypertension." It is a healthy eating plan that has been shown to reduce high blood pressure (hypertension) in as little as 14 days, while also possibly providing other significant health benefits. These other health benefits include reducing the risk of breast cancer after menopause and reducing the risk of type 2 diabetes, heart disease, colon cancer, and stroke. Health benefits also include weight loss and slowing kidney failure in patients with chronic kidney disease.  DIET GUIDELINES  Limit salt (sodium). Your diet should contain less than 1500 mg of sodium daily.  Limit refined or processed carbohydrates. Your diet should include mostly whole grains. Desserts and added sugars should be used sparingly.  Include small amounts of heart-healthy fats. These types of fats include nuts, oils, and tub margarine. Limit saturated and trans fats. These fats have been shown to be harmful in the body. CHOOSING FOODS  The following food groups are based on a 2000 calorie diet. See your Registered Dietitian for individual calorie needs. Grains and Grain Products (6 to 8 servings daily)  Eat More Often: Whole-wheat bread, brown rice, whole-grain or wheat pasta, quinoa, popcorn without added fat or salt (air popped).  Eat Less Often: White bread, white pasta, white rice, cornbread. Vegetables (4 to 5 servings daily)  Eat More Often: Fresh, frozen, and canned vegetables. Vegetables may be raw, steamed, roasted, or grilled with a minimal amount of fat.  Eat Less Often/Avoid: Creamed or fried vegetables. Vegetables in a cheese sauce. Fruit (4 to 5 servings daily)  Eat More Often: All fresh, canned (in natural juice), or frozen fruits. Dried fruits without added sugar. One hundred percent fruit juice ( cup [237 mL] daily).  Eat Less Often: Dried fruits with added sugar. Canned fruit in light or heavy syrup. [employer], Fish, and Poultry (2  servings or less daily. One serving is 3 to 4 oz [85-114 g]).  Eat More Often: Ninety percent or leaner ground beef, tenderloin, sirloin. Round cuts of beef, chicken breast, [place] breast. All fish. Grill, bake, or broil your meat. Nothing should be fried.  Eat Less Often/Avoid: Fatty cuts of meat, [place], or chicken leg, thigh, or wing. Fried cuts of meat or fish. Dairy (2 to 3 servings)  Eat More Often: Low-fat or fat-free milk, low-fat plain or light yogurt, reduced-fat or part-skim cheese.  Eat Less Often/Avoid: Milk (whole, 2%).Whole milk yogurt. Full-fat cheeses. Nuts, Seeds, and Legumes (4 to 5 servings per week)  Eat More Often: All without added salt.  Eat Less Often/Avoid: Salted nuts and seeds, canned beans with added salt. Fats and Sweets (limited)  Eat More Often: Vegetable oils, tub margarines without trans fats, sugar-free gelatin. Mayonnaise and salad dressings.  Eat Less Often/Avoid: Coconut oils, palm oils, butter, stick margarine, cream, half and half, cookies, candy, pie. FOR MORE INFORMATION The Dash Diet Eating Plan: www.dashdiet.org Document Released: 05/07/2011 Document Revised: 08/10/2011 Document Reviewed: 05/07/2011 [facility] Patient Information 2014 [place], [place]. Basic Carbohydrate Counting Basic carbohydrate counting is a way to plan meals. It is done by counting the amount of carbohydrate in foods. Foods that have carbohydrates are starches (grains, beans, starchy vegetables) and sweets. Eating carbohydrates increases blood glucose (sugar) levels. People with diabetes use carbohydrate counting to help keep their blood glucose at a normal level.  COUNTING CARBOHYDRATES IN FOODS The first step in counting carbohydrates is to learn how many carbohydrate servings you should have in every meal. A dietitian can plan this for  you. After learning the amount of carbohydrates to include in your meal plan, you can start to choose the carbohydrate-containing foods you  want to eat.  There are 2 ways to identify the amount of carbohydrates in the foods you eat.  Read the Nutrition Facts panel on food labels. You need 2 pieces of information from the Nutrition Facts panel to count carbohydrates this way:  Serving size.  Total carbohydrate (in grams). Decide how many servings you will be eating. If it is 1 serving, you will be eating the amount of carbohydrate listed on the panel. If you will be eating 2 servings, you will be eating double the amount of carbohydrate listed on the panel.   Learn serving sizes. A serving size of most carbohydrate-containing foods is about 15 grams (g). Listed below are single serving sizes of common carbohydrate-containing foods:  1 slice bread.   cup unsweetened, dry cereal.   cup hot cereal.   cup rice.   cup mashed potatoes.   cup pasta.  1 cup fresh fruit.   cup canned fruit.  1 cup milk (whole, 2%, or skim).   cup starchy vegetables (peas, corn, or potatoes). Counting carbohydrates this way is similar to looking on the Nutrition Facts panel. Decide how many servings you will eat first. Multiply the number of servings you eat by 15 g. For example, if you have 2 cups of strawberries, you had 2 servings. That means you had 30 g of carbohydrate (2 servings x 15 g = 30 g). CALCULATING CARBOHYDRATES IN A MEAL Sample dinner  3 oz chicken breast.   cup brown rice.   cup corn.  1 cup fat-free milk.  1 cup strawberries with sugar-free whipped topping. Carbohydrate calculation First, identify the foods that contain carbohydrate:  Rice.  Corn.  Milk.  Strawberries. Calculate the number of servings eaten:  2 servings rice.  1 serving corn.  1 serving milk.  1 serving strawberries. Multiply the number of servings by 15 g:  2 servings rice x 15 g = 30 g.  1 serving corn x 15 g = 15 g.  1 serving milk x 15 g = 15 g.  1 serving strawberries x 15 g = 15 g. Add the amounts to find the  total carbohydrates eaten: 30 g + 15 g + 15 g + 15 g = 75 g carbohydrate eaten at dinner. Document Released: 05/18/2005 Document Revised: 08/10/2011 Document Reviewed: 04/03/2011 [facility] Patient Information 2014 [place], [place].

## 2013-08-23 LAB — TSH: TSH: 0.568 u[IU]/mL (ref 0.350–4.500)

## 2013-08-27 ENCOUNTER — Encounter: Payer: Self-pay | Admitting: Family Medicine

## 2013-08-27 DIAGNOSIS — Z0000 Encounter for general adult medical examination without abnormal findings: Secondary | ICD-10-CM | POA: Insufficient documentation

## 2013-08-27 NOTE — Assessment & Plan Note (Signed)
Patient encouraged to maintain heart healthy diet, regular exercise, adequate sleep. Consider daily probiotics. Take medications as prescribed.[employer] UTD in 2014. Tdap today

## 2013-08-27 NOTE — Assessment & Plan Note (Signed)
Well controlled, no changes to meds. Encouraged heart healthy diet such as the DASH diet and exercise as tolerated.  °

## 2013-08-27 NOTE — Assessment & Plan Note (Signed)
Avoid offending foods, start probiotics. Do not eat large meals in late evening and consider raising head of bed.  

## 2013-08-27 NOTE — Progress Notes (Signed)
Patient ID: Sydney Pace, female   DOB: 05/09/1956, 59 y.o.   MRN: [id number] Sydney Pace [id number] 20-Oct-1955 08/27/2013      Progress Note-Follow Up  Subjective  Chief Complaint  Chief Complaint  Patient presents with  . Annual Exam    physical  . Injections    tdap    HPI  Patient is a 58 year old female in today for routine medical care. Patient is noting some recent increase in heartburn. She notes when she skips meals and tends to escalate. She epigastric discomfort and dyspepsia. She continues to struggle with insomnia. She has restricted to excellent but so far has not helped her heartburn March. She also complains of increasing fatigue and weight. Has frequent shortness of breath with exertion. No chest pain, palpitations. Was placed on Gilenia by Dr Kerman Passey for her MS but stopped it due to GI disturbances including constipation. Symptoms have improved but she has not returned to Dr Kerman Passey to discuss options.  Past Medical History  Diagnosis Date  . Depression   . Allergy   . GERD (gastroesophageal reflux disease)   . Hypertension   . Thyroid disease   . MS (multiple sclerosis)   . Sinusitis 08/08/2012  . Other and unspecified hyperlipidemia 08/08/2012  . Allergic state 08/08/2012  . Esophageal reflux 08/08/2012  . Arthritis   . Preventative health care 08/27/2013    Past Surgical History  Procedure Laterality Date  . Tonsillectomy    . Tmj arthroscopy    . Hysteroscopy  02, 04, 2008    with D&C    Family History  Problem Relation Age of Onset  . Heart disease Mother     pacemaker  . Emphysema Mother   . Hypertension Mother   . Heart disease Father   . Diabetes Father   . Colon cancer Neg Hx   . Esophageal cancer Neg Hx   . Rectal cancer Neg Hx   . Stomach cancer Neg Hx   . Heart disease Sister     cad  . Sleep apnea Brother     History   Social History  . Marital Status: Single    Spouse Name: N/A    Number of Children: N/A  . Years of Education:  N/A   Occupational History  . Not on file.   Social History Main Topics  . Smoking status: Never Smoker   . Smokeless tobacco: Never Used  . Alcohol Use: Yes     Comment: occassional  . Drug Use: No  . Sexual Activity: Not Currently     Comment: no dietary restrictions, decreased simple sugar   Other Topics Concern  . Not on file   Social History Narrative  . No narrative on file    Current Outpatient Prescriptions on File Prior to Visit  Medication Sig Dispense Refill  . bismuth-metronidazole-tetracycline (PYLERA) 140-125-125 MG per capsule Take 3 capsules by mouth 4 (four) times daily -  before meals and at bedtime.  120 capsule  0  . calcium carbonate 200 MG capsule Take 250 mg by mouth 2 (two) times daily with a meal.      . cetirizine (ZYRTEC) 10 MG tablet Take 10 mg by mouth daily.      . Cholecalciferol (VITAMIN D PO) Take by mouth.      Mariane Baumgarten Calcium (STOOL SOFTENER PO) Take by mouth daily as needed.      . furosemide (LASIX) 20 MG tablet One po qam prn leg swelling  90  tablet  1  . lisinopril-hydrochlorothiazide (PRINZIDE,ZESTORETIC) 20-25 MG per tablet TAKE ONE TABLET BY MOUTH ONCE DAILY  90 tablet  0  . meloxicam (MOBIC) 15 MG tablet Take 1 tablet (15 mg total) by mouth daily as needed for pain. With food  30 tablet  5  . Multiple Vitamin (MULTIVITAMIN) tablet Take 1 tablet by mouth daily.      Marland Kitchen oxybutynin (DITROPAN) 5 MG tablet Take 1 tablet (5 mg total) by mouth 2 (two) times daily.  60 tablet  5  . sertraline (ZOLOFT) 50 MG tablet TAKE ONE TABLET BY MOUTH ONCE DAILY  30 tablet  0  . [DISCONTINUED] oxybutynin (DITROPAN) 5 MG tablet Take 5 mg by mouth 2 (two) times daily.         No current facility-administered medications on file prior to visit.    No Known Allergies  Review of Systems  Review of Systems  Constitutional: Positive for malaise/fatigue. Negative for fever.  HENT: Negative for congestion.   Eyes: Negative for discharge.  Respiratory:  Positive for shortness of breath.   Cardiovascular: Negative for chest pain, palpitations and leg swelling.  Gastrointestinal: Positive for heartburn and abdominal pain. Negative for nausea and diarrhea.  Genitourinary: Negative for dysuria.  Musculoskeletal: Negative for falls.  Skin: Negative for rash.  Neurological: Negative for loss of consciousness and headaches.  Endo/Heme/Allergies: Negative for polydipsia.  Psychiatric/Behavioral: Negative for depression and suicidal ideas. The patient is not nervous/anxious and does not have insomnia.     Objective  BP 130/90  Pulse 67  Temp(Src) 97.7 F (36.5 C) (Oral)  Ht 5\' 5"  (1.651 m)  Wt 293 lb 1.3 oz (132.94 kg)  BMI 48.77 kg/m2  SpO2 98%  Physical Exam  Physical Exam  Constitutional: She is oriented to person, place, and time and well-developed, well-nourished, and in no distress. No distress.  HENT:  Head: Normocephalic and atraumatic.  Right Ear: External ear normal.  Left Ear: External ear normal.  Nose: Nose normal.  Mouth/Throat: Oropharynx is clear and moist. No oropharyngeal exudate.  Eyes: Conjunctivae are normal. Pupils are equal, round, and reactive to light. Right eye exhibits no discharge. Left eye exhibits no discharge. No scleral icterus.  Neck: Normal range of motion. Neck supple. No thyromegaly present.  Cardiovascular: Normal rate, regular rhythm, normal heart sounds and intact distal pulses.   No murmur heard. Pulmonary/Chest: Effort normal and breath sounds normal. No respiratory distress. She has no wheezes. She has no rales.  Abdominal: Soft. Bowel sounds are normal. She exhibits no distension and no mass. There is no tenderness.  Musculoskeletal: Normal range of motion. She exhibits no edema and no tenderness.  Lymphadenopathy:    She has no cervical adenopathy.  Neurological: She is alert and oriented to person, place, and time. She has normal reflexes. No cranial nerve deficit. Coordination normal.   Skin: Skin is warm and dry. No rash noted. She is not diaphoretic.  Psychiatric: Mood, memory and affect normal.    Lab Results  Component Value Date   TSH 0.568 08/22/2013   Lab Results  Component Value Date   WBC 5.8 08/22/2013   HGB 13.1 08/22/2013   HCT 37.6 08/22/2013   MCV 85.6 08/22/2013   PLT 278 08/22/2013   Lab Results  Component Value Date   CREATININE 0.96 08/22/2013   BUN 16 08/22/2013   NA 139 08/22/2013   K 4.4 08/22/2013   CL 101 08/22/2013   CO2 31 08/22/2013   Lab Results  Component Value Date   ALT 18 08/22/2013   AST 22 08/22/2013   ALKPHOS 83 08/22/2013   BILITOT 0.5 08/22/2013   Lab Results  Component Value Date   CHOL 189 08/22/2013   Lab Results  Component Value Date   HDL 54 08/22/2013   Lab Results  Component Value Date   LDLCALC 121* 08/22/2013   Lab Results  Component Value Date   TRIG 70 08/22/2013   Lab Results  Component Value Date   CHOLHDL 3.5 08/22/2013     Assessment & Plan  Hypertension Well controlled, no changes to meds. Encouraged heart healthy diet such as the DASH diet and exercise as tolerated.   Other and unspecified hyperlipidemia Encouraged heart healthy diet, increase exercise, avoid trans fats, consider a krill oil cap daily  Overactive bladder Tolerates Oxyxybutynin. No changes  Esophageal reflux Avoid offending foods, start probiotics. Do not eat large meals in late evening and consider raising head of bed.   Preventative health care Patient encouraged to maintain heart healthy diet, regular exercise, adequate sleep. Consider daily probiotics. Take medications as prescribed.MGM UTD in 2014. Tdap today  Multiple sclerosis Stopped her Gilenia due to GI disturbances. Encouraged to return to Dr Kerman Passey to discuss her options

## 2013-08-27 NOTE — Assessment & Plan Note (Signed)
Tolerates Oxyxybutynin. No changes

## 2013-08-27 NOTE — Assessment & Plan Note (Signed)
Encouraged heart healthy diet, increase exercise, avoid trans fats, consider a krill oil cap daily 

## 2013-08-27 NOTE — Assessment & Plan Note (Signed)
Stopped her Gilenia due to GI disturbances. Encouraged to return to Dr Kerman Passey to discuss her options

## 2013-10-11 ENCOUNTER — Other Ambulatory Visit: Payer: Self-pay | Admitting: Family Medicine

## 2013-11-12 ENCOUNTER — Other Ambulatory Visit: Payer: Self-pay | Admitting: Family Medicine

## 2013-11-15 ENCOUNTER — Ambulatory Visit: Payer: Medicare Other | Admitting: Physician Assistant

## 2013-12-15 ENCOUNTER — Other Ambulatory Visit: Payer: Self-pay | Admitting: Gastroenterology

## 2014-01-13 ENCOUNTER — Other Ambulatory Visit: Payer: Self-pay | Admitting: Gastroenterology

## 2014-02-19 ENCOUNTER — Ambulatory Visit: Payer: Medicare Other | Admitting: Medical

## 2014-03-16 ENCOUNTER — Other Ambulatory Visit: Payer: Self-pay

## 2014-03-18 ENCOUNTER — Other Ambulatory Visit: Payer: Self-pay | Admitting: Family Medicine

## 2014-03-21 ENCOUNTER — Ambulatory Visit: Payer: Medicare Other | Admitting: Physician Assistant

## 2014-03-21 ENCOUNTER — Encounter: Payer: Self-pay | Admitting: Family Medicine

## 2014-03-21 ENCOUNTER — Ambulatory Visit (INDEPENDENT_AMBULATORY_CARE_PROVIDER_SITE_OTHER): Payer: Medicare Other | Admitting: Family Medicine

## 2014-03-21 VITALS — BP 130/78 | HR 67 | Temp 97.9°F | Resp 16 | Wt 284.1 lb

## 2014-03-21 DIAGNOSIS — K219 Gastro-esophageal reflux disease without esophagitis: Secondary | ICD-10-CM

## 2014-03-21 DIAGNOSIS — A048 Other specified bacterial intestinal infections: Secondary | ICD-10-CM | POA: Insufficient documentation

## 2014-03-21 DIAGNOSIS — Z0289 Encounter for other administrative examinations: Secondary | ICD-10-CM

## 2014-03-21 DIAGNOSIS — B9681 Helicobacter pylori [H. pylori] as the cause of diseases classified elsewhere: Secondary | ICD-10-CM

## 2014-03-21 DIAGNOSIS — K921 Melena: Secondary | ICD-10-CM

## 2014-03-21 LAB — CBC WITH DIFFERENTIAL/PLATELET
BASOS ABS: 0.1 10*3/uL (ref 0.0–0.1)
BASOS PCT: 0.8 % (ref 0.0–3.0)
EOS ABS: 0.2 10*3/uL (ref 0.0–0.7)
Eosinophils Relative: 2.7 % (ref 0.0–5.0)
HCT: 40 % (ref 36.0–46.0)
Hemoglobin: 13.1 g/dL (ref 12.0–15.0)
Lymphocytes Relative: 24.9 % (ref 12.0–46.0)
Lymphs Abs: 1.8 10*3/uL (ref 0.7–4.0)
MCHC: 32.7 g/dL (ref 30.0–36.0)
MCV: 88.4 fl (ref 78.0–100.0)
Monocytes Absolute: 0.4 10*3/uL (ref 0.1–1.0)
Monocytes Relative: 5 % (ref 3.0–12.0)
NEUTROS PCT: 66.6 % (ref 43.0–77.0)
Neutro Abs: 4.8 10*3/uL (ref 1.4–7.7)
Platelets: 250 10*3/uL (ref 150.0–400.0)
RBC: 4.52 Mil/uL (ref 3.87–5.11)
RDW: 14.9 % (ref 11.5–15.5)
WBC: 7.2 10*3/uL (ref 4.0–10.5)

## 2014-03-21 LAB — HEPATIC FUNCTION PANEL
ALK PHOS: 78 U/L (ref 39–117)
ALT: 15 U/L (ref 0–35)
AST: 20 U/L (ref 0–37)
Albumin: 3.3 g/dL — ABNORMAL LOW (ref 3.5–5.2)
BILIRUBIN DIRECT: 0 mg/dL (ref 0.0–0.3)
BILIRUBIN TOTAL: 0.6 mg/dL (ref 0.2–1.2)
Total Protein: 8.1 g/dL (ref 6.0–8.3)

## 2014-03-21 LAB — BASIC METABOLIC PANEL
BUN: 19 mg/dL (ref 6–23)
CO2: 23 mEq/L (ref 19–32)
Calcium: 10.2 mg/dL (ref 8.4–10.5)
Chloride: 105 mEq/L (ref 96–112)
Creatinine, Ser: 1.1 mg/dL (ref 0.4–1.2)
GFR: 62.7 mL/min (ref >60.00)
Glucose, Bld: 90 mg/dL (ref 70–99)
POTASSIUM: 4.3 meq/L (ref 3.5–5.1)
Sodium: 140 mEq/L (ref 135–145)

## 2014-03-21 LAB — H. PYLORI ANTIBODY, IGG: H Pylori IgG: POSITIVE — AB

## 2014-03-21 MED ORDER — GI COCKTAIL ~~LOC~~
30.0000 mL | Freq: Once | ORAL | Status: AC
  Administered 2014-03-21: 30 mL via ORAL

## 2014-03-21 MED ORDER — SUCRALFATE 1 G PO TABS: 1.0000 g | ORAL_TABLET | Freq: Three times a day (TID) | ORAL | Status: DC

## 2014-03-21 MED ORDER — PANTOPRAZOLE SODIUM 40 MG PO TBEC: 40.0000 mg | DELAYED_RELEASE_TABLET | Freq: Every day | ORAL | Status: DC

## 2014-03-21 NOTE — Assessment & Plan Note (Signed)
Pt w/ hx of this and feels her current sxs are similar to previous.  Check labs.  Treat prn.  Pt expressed understanding and is in agreement w/ plan.

## 2014-03-21 NOTE — Progress Notes (Signed)
   Subjective:    Patient ID: Sydney Pace, female    DOB: 10/05/1955, 58 y.o.   MRN: [id number]  HPI GERD- pt has hx of similar, but recently sxs are worse.  Not currently on Dexilant.  Taking Tagamet OTC w/o relief.  Pt reports severe heartburn w/ 'the littlest bit of lettuce'.  Pain will radiate through to back.  + sour brash.  No vomiting, + nausea.  No diarrhea.  Stools are dark in color.  Hx of + H pylori- current sxs feel similar.  'my stomach hurts all the time'.   Review of Systems For ROS see HPI     Objective:   Physical Exam  Vitals reviewed. Constitutional: She is oriented to person, place, and time. She appears well-developed and well-nourished. No distress.  HENT:  Head: Normocephalic and atraumatic.  Cardiovascular: Normal rate, regular rhythm, normal heart sounds and intact distal pulses.   Pulmonary/Chest: Effort normal and breath sounds normal. No respiratory distress. She has no wheezes. She has no rales.  Abdominal: Soft. Bowel sounds are normal. She exhibits no distension. There is tenderness (mild diffuse TTP). There is no rebound and no guarding.  Musculoskeletal: She exhibits no edema.  Neurological: She is alert and oriented to person, place, and time.  Skin: Skin is warm and dry.          Assessment & Plan:

## 2014-03-21 NOTE — Patient Instructions (Signed)
Follow up as needed Start the Protonix daily to decrease the acid production Start the Carafate 3x/day before meals to allow the stomach to heal (gastritis- inflammation of the stomach lining) We'll notify you of your lab results and make any changes if needed Call with any questions or concerns- particularly if not improving Hang in there!!!

## 2014-03-21 NOTE — Assessment & Plan Note (Signed)
New.  Heme (-) today in office.  Suspect this is due to gastritis/inflammation/possible PUD.  Start carafate in addition to PPI.

## 2014-03-21 NOTE — Progress Notes (Signed)
Pre visit review using our clinic review tool, if applicable. No additional management support is needed unless otherwise documented below in the visit note. 

## 2014-03-21 NOTE — Assessment & Plan Note (Signed)
Deteriorated.  Pt is not currently on PPI.  Start Protonix to improve sxs.  Pt's abd pain and GERD improved w/ GI cocktail.  Check labs to r/o H pylori.  Reviewed supportive care and red flags that should prompt return.  Pt expressed understanding and is in agreement w/ plan.

## 2014-03-22 ENCOUNTER — Other Ambulatory Visit: Payer: Self-pay | Admitting: General Practice

## 2014-03-22 ENCOUNTER — Telehealth: Payer: Self-pay | Admitting: Family Medicine

## 2014-03-22 DIAGNOSIS — A048 Other specified bacterial intestinal infections: Secondary | ICD-10-CM

## 2014-03-22 MED ORDER — BIS SUBCIT-METRONID-TETRACYC 140-125-125 MG PO CAPS: 3.0000 | ORAL_CAPSULE | Freq: Three times a day (TID) | ORAL | Status: DC

## 2014-03-22 NOTE — Telephone Encounter (Signed)
Caller name:Roslin  Call back number: [phone]    Reason for call:  Pt states was in yesterday 10.21.15 and was seen by Dr. Birdie Riddle. Pt wants to know the results of lab since pt was referral to another doctor. Please advise.

## 2014-03-22 NOTE — Telephone Encounter (Signed)
Pt notified of results and advised they had been sent to her mychart.

## 2014-04-02 ENCOUNTER — Encounter: Payer: Self-pay | Admitting: Family Medicine

## 2014-04-03 ENCOUNTER — Ambulatory Visit (INDEPENDENT_AMBULATORY_CARE_PROVIDER_SITE_OTHER): Payer: Medicare Other | Admitting: Gastroenterology

## 2014-04-03 ENCOUNTER — Encounter: Payer: Self-pay | Admitting: Gastroenterology

## 2014-04-03 ENCOUNTER — Other Ambulatory Visit: Payer: Medicare Other

## 2014-04-03 VITALS — BP 128/84 | HR 76 | Ht 65.0 in | Wt 288.5 lb

## 2014-04-03 DIAGNOSIS — A048 Other specified bacterial intestinal infections: Secondary | ICD-10-CM

## 2014-04-03 DIAGNOSIS — R1013 Epigastric pain: Secondary | ICD-10-CM

## 2014-04-03 DIAGNOSIS — R109 Unspecified abdominal pain: Secondary | ICD-10-CM | POA: Insufficient documentation

## 2014-04-03 DIAGNOSIS — B9681 Helicobacter pylori [H. pylori] as the cause of diseases classified elsewhere: Secondary | ICD-10-CM

## 2014-04-03 DIAGNOSIS — Z1212 Encounter for screening for malignant neoplasm of rectum: Secondary | ICD-10-CM

## 2014-04-03 DIAGNOSIS — G35 Multiple sclerosis: Secondary | ICD-10-CM

## 2014-04-03 DIAGNOSIS — G8929 Other chronic pain: Secondary | ICD-10-CM

## 2014-04-03 DIAGNOSIS — Z1211 Encounter for screening for malignant neoplasm of colon: Secondary | ICD-10-CM | POA: Insufficient documentation

## 2014-04-03 NOTE — Assessment & Plan Note (Signed)
Last colonoscopy about 9 years ago.  Plan follow colonoscopy 2016

## 2014-04-03 NOTE — Progress Notes (Signed)
      History of Present Illness:  Sydney Pace returned for reevaluation of abdominal pain.  For several months she was complaining of upper epigastric pain.  Carafate was added to her regimen of Protonix.  At the same time she discontinued coffee.  Recent H. Pylori serum antibodywas positive.  She was treated about one year ago for H. Pylori infection.  The patient states that caffeine helps her multiple sclerosis from the standpoint that it increases her alertness.    Review of Systems: Pertinent positive and negative review of systems were noted in the above HPI section. All other review of systems were otherwise negative.    Current Medications, Allergies, Past Medical History, Past Surgical History, Family History and Social History were reviewed in [facility] record  Vital signs were reviewed in today's medical record. Physical Exam: General: obese female in no acute distress t  See Assessment and Plan under Problem List

## 2014-04-03 NOTE — Patient Instructions (Signed)
Go to the basement for labs today  Resume coffee Follow up in 8 weeks

## 2014-04-03 NOTE — Assessment & Plan Note (Signed)
Epigastric pain has subsided with a regimen of dexilant and Carafate.  It also coincided with discontinuing coffee.  Symptoms could be due to nonulcerative dyspepsia.  Caffeine may be contributing.  Patient is concerned because of her serum test bowels positive for H. Pylori.  Recommendations #1  Check stool for H. Pylori antigen and treat accordingly #2 since the patient is pain-free plan to restart coffee.  Should she develop recurrent pain this certainly would suggest that she does not tolerate caffeine

## 2014-04-04 ENCOUNTER — Other Ambulatory Visit: Payer: Medicare Other

## 2014-04-04 DIAGNOSIS — A048 Other specified bacterial intestinal infections: Secondary | ICD-10-CM

## 2014-04-05 LAB — HELICOBACTER PYLORI  SPECIAL ANTIGEN: H. PYLORI Antigen: POSITIVE

## 2014-04-06 ENCOUNTER — Other Ambulatory Visit: Payer: Self-pay

## 2014-04-06 MED ORDER — AMOXICILL-CLARITHRO-LANSOPRAZ PO MISC: Freq: Two times a day (BID) | ORAL | Status: DC

## 2014-04-18 ENCOUNTER — Other Ambulatory Visit: Payer: Self-pay | Admitting: Family Medicine

## 2014-04-19 ENCOUNTER — Telehealth: Payer: Self-pay | Admitting: Gastroenterology

## 2014-04-19 NOTE — Telephone Encounter (Signed)
What do you want to prescribe her besides Prevpac for Hpylori?  Can we send in the antibiotics seperate

## 2014-04-20 MED ORDER — BIS SUBCIT-METRONID-TETRACYC 140-125-125 MG PO CAPS: 3.0000 | ORAL_CAPSULE | Freq: Three times a day (TID) | ORAL | Status: DC

## 2014-04-20 NOTE — Telephone Encounter (Signed)
You can prescribed Pylera

## 2014-04-24 NOTE — Telephone Encounter (Signed)
Called to make sure patient received her Pylera  She has been taking it and feels better

## 2014-05-21 ENCOUNTER — Other Ambulatory Visit: Payer: Self-pay | Admitting: Family Medicine

## 2014-05-21 NOTE — Telephone Encounter (Signed)
Rx sent to the pharmacy by e-script.//AB/CMA 

## 2014-07-04 ENCOUNTER — Other Ambulatory Visit: Payer: Self-pay | Admitting: Family Medicine

## 2014-07-05 NOTE — Telephone Encounter (Signed)
Patient 2nd request for this medication.  Last seen 08/22/13 for physical- can call to schedule but you are booked up until at least through May for CPE.  Please advise. eal

## 2014-07-05 NOTE — Telephone Encounter (Signed)
If she is doing well I am willing to refill the Sertraline for 30 day supply with 3 rf to cover her til physical and then schedule that

## 2014-07-06 NOTE — Telephone Encounter (Signed)
Can you please schedule this patient for CPE? (OK that it is a long way out).

## 2014-07-10 ENCOUNTER — Encounter: Payer: Self-pay | Admitting: Family Medicine

## 2014-07-10 NOTE — Telephone Encounter (Signed)
Pt scheduled CPE for 12/06/2014, pt stated she is doing fine and thank you for the refills.

## 2014-07-10 NOTE — Telephone Encounter (Signed)
Phone # not working. Will send letter.

## 2014-08-07 ENCOUNTER — Other Ambulatory Visit: Payer: Self-pay | Admitting: Family Medicine

## 2014-08-07 NOTE — Telephone Encounter (Signed)
Med filled.  

## 2014-08-16 ENCOUNTER — Ambulatory Visit (INDEPENDENT_AMBULATORY_CARE_PROVIDER_SITE_OTHER): Payer: Medicare Other | Admitting: Neurology

## 2014-08-16 ENCOUNTER — Encounter: Payer: Self-pay | Admitting: Neurology

## 2014-08-16 VITALS — BP 118/78 | HR 76 | Resp 16 | Ht 66.5 in | Wt 289.4 lb

## 2014-08-16 DIAGNOSIS — N3281 Overactive bladder: Secondary | ICD-10-CM | POA: Diagnosis not present

## 2014-08-16 DIAGNOSIS — F32A Depression, unspecified: Secondary | ICD-10-CM

## 2014-08-16 DIAGNOSIS — F329 Major depressive disorder, single episode, unspecified: Secondary | ICD-10-CM

## 2014-08-16 DIAGNOSIS — H469 Unspecified optic neuritis: Secondary | ICD-10-CM | POA: Diagnosis not present

## 2014-08-16 DIAGNOSIS — G35 Multiple sclerosis: Secondary | ICD-10-CM | POA: Diagnosis not present

## 2014-08-16 DIAGNOSIS — G4700 Insomnia, unspecified: Secondary | ICD-10-CM | POA: Diagnosis not present

## 2014-08-16 DIAGNOSIS — R260 Ataxic gait: Secondary | ICD-10-CM

## 2014-08-16 DIAGNOSIS — R5383 Other fatigue: Secondary | ICD-10-CM | POA: Diagnosis not present

## 2014-08-16 MED ORDER — SERTRALINE HCL 50 MG PO TABS: ORAL_TABLET | ORAL | Status: DC

## 2014-08-16 MED ORDER — OXYBUTYNIN CHLORIDE 5 MG PO TABS: 5.0000 mg | ORAL_TABLET | Freq: Two times a day (BID) | ORAL | Status: DC

## 2014-08-16 MED ORDER — METHYLPHENIDATE HCL 20 MG PO TABS
20.0000 mg | ORAL_TABLET | Freq: Three times a day (TID) | ORAL | Status: DC
Start: 2014-08-16 — End: 2014-11-07

## 2014-08-16 MED ORDER — MELOXICAM 15 MG PO TABS: 15.0000 mg | ORAL_TABLET | Freq: Every day | ORAL | Status: DC | PRN

## 2014-08-16 NOTE — Progress Notes (Signed)
[facility]  PATIENT: Sydney Pace DOB: 09-09-1955  REFERRING DOCTOR OR PCP:  Penni Homans SOURCE: Patient and medical/lab/imaging records from [facility]  _________________________________   HISTORICAL  CHIEF COMPLAINT:  Chief Complaint  Patient presents with  . Multiple Sclerosis    She is not on any MS therapy--she tried Gilenya  but sts. she felt worse on it, so stopped.  Sts. rls sx. are some worse.  She also c/o more trouble emptying her bladder./fim    HISTORY OF PRESENT ILLNESS:  Sydney Pace is a 59 year old woman with multiple sclerosis.   She was diagnosed with MS more than 30 years ago after an episodes of optic Neuritis.  MRI was performed in 08-05-1981 and was consistent with MS.     In the late 1990's, she was started on Copaxone but stopped after several weeks due to skin reactions. She also tried Avonex but had a rash and stopped. She started Gilenya in January 2014 but stopped after 7 or 8 months due to being more fatigue and having flulike symptoms. In May 2015 we had discussed Aubagio. She decided not to start.  She had an MRI of the brain with and without contrast on 10/27/2013 and I reviewed the study. It showed white matter foci in a pattern consistent with MS. There was no enhancement. When compared to an MRI dated 11/08/2009, there was no interval change.  She has some symptoms related to her MS.  Gait/strength/sensation:   She feels her gait is doing fairly well. However, she has some difficulty climbing steps because her right leg is a little weaker than the left leg tires out easily. She also gets muscle aches in both legs. Legs are stiff, especially if she is sitting or laying for long time. She denies any numbness or tingling in her arms or legs.  Bladder:  She notes a little bit of urinary urgency with occasional frequency.  Oxybutynin has helped and she tolerates it well. She denies hesitancy but she does not feel that she  completely empties. She often has to go back to the bathroom several minutes after she leaves. She has not had any urinary tract infections for many years.  Vision/vertigo:  In mid-2015, she noted more vertigo. This often occurs after changing positions. She sometimes felt that she veered while she walked.  Vertigo has since improved but she sometimes feel she does not walk straight.   .In Aug 05, 2010, she had visual field testing showing a right superior homonymous field cut.    The optic neuritis was on the left.   She feels vision recovered almost to baseline..  Fatigue/sleep: She has fatigue that she feels is more physical and cognitive. She has received some benefit from methylphenidate. She did not think Nuvigil had helped her much.   She has some difficulty with insomnia. This is more a problem staying asleep and falling asleep. She snores. No one has ever told her that she has apnea.  Pain: She has had issues with pain in her legs. This has been fairly stable.  Mood/cognition: In 08-06-2011, her mother died and she had a depression that worsened in August 05, 2012.   Fluoxetine had not helped and she was switched to sertraline.   She did on sertraline she did better and cut her dose from to 50 mg. however, she'll take an additional 50 mg if she is having a bad day. She notes some mild cognitive dysfunction. Specifically there is some difficulty with short-term memory, verbal fluency  and processing speed.    REVIEW OF SYSTEMS: Constitutional: No fevers, chills, sweats, or change in appetite.   Notes fatigue Eyes: see above.  No double vision, eye pain Ear, nose and throat: No hearing loss, ear pain, nasal congestion, sore throat Cardiovascular: No chest pain, palpitations Respiratory: No shortness of breath at rest or with exertion.   No wheezes GastrointestinaI: No nausea, vomiting, diarrhea, abdominal pain, fecal incontinence Genitourinary: Mild urinary frequency.  No nocturia. Musculoskeletal: No neck pain,  back pain Integumentary: No rash, pruritus, skin lesions Neurological: as above Psychiatric: Only mild depression at this time, rarely cries, less anxiety Endocrine: No palpitations, diaphoresis, change in appetite, change in weigh or increased thirst Hematologic/Lymphatic: No anemia, purpura, petechiae. Allergic/Immunologic: No itchy/runny eyes, nasal congestion, recent allergic reactions, rashes  ALLERGIES: No Known Allergies  HOME MEDICATIONS:  Current outpatient prescriptions:  .  Cholecalciferol (VITAMIN D PO), Take by mouth., Disp: , Rfl:  .  Docusate Calcium (STOOL SOFTENER PO), Take by mouth daily as needed., Disp: , Rfl:  .  furosemide (LASIX) 20 MG tablet, TAKE ONE TABLET BY MOUTH IN THE MORNING AS NEEDED FOR  LEG  SWELLING, Disp: 90 tablet, Rfl: 0 .  lisinopril-hydrochlorothiazide (PRINZIDE,ZESTORETIC) 20-25 MG per tablet, TAKE ONE TABLET BY MOUTH ONCE DAILY, Disp: 90 tablet, Rfl: 0 .  meloxicam (MOBIC) 15 MG tablet, Take 1 tablet (15 mg total) by mouth daily as needed for pain. With food, Disp: 30 tablet, Rfl: 5 .  methylphenidate (RITALIN) 20 MG tablet, Take 20 mg by mouth 2 (two) times daily., Disp: , Rfl:  .  Multiple Vitamin (MULTIVITAMIN) tablet, Take 1 tablet by mouth daily., Disp: , Rfl:  .  oxybutynin (DITROPAN) 5 MG tablet, Take 1 tablet (5 mg total) by mouth 2 (two) times daily., Disp: 60 tablet, Rfl: 5 .  pantoprazole (PROTONIX) 40 MG tablet, TAKE ONE TABLET BY MOUTH ONCE DAILY, Disp: 30 tablet, Rfl: 3 .  sertraline (ZOLOFT) 50 MG tablet, TAKE ONE TABLET BY MOUTH ONCE DAILY-NEEDS TO BE SEEN, Disp: 30 tablet, Rfl: 3 .  sucralfate (CARAFATE) 1 G tablet, Take 1 tablet (1 g total) by mouth 4 (four) times daily -  with meals and at bedtime., Disp: 90 tablet, Rfl: 1 .  amoxicillin-clarithromycin-lansoprazole (PREVPAC) combo pack, Take by mouth 2 (two) times daily. Follow package directions. Stop Pantoprazole while on this PrevPak . Resume when completed., Disp: 1 kit, Rfl:  0 .  bismuth-metronidazole-tetracycline (PYLERA) 140-125-125 MG per capsule, Take 3 capsules by mouth 4 (four) times daily -  before meals and at bedtime., Disp: 120 capsule, Rfl: 0 .  bismuth-metronidazole-tetracycline (PYLERA) 140-125-125 MG per capsule, Take 3 capsules by mouth 4 (four) times daily -  before meals and at bedtime., Disp: 120 capsule, Rfl: 0 .  calcium carbonate 200 MG capsule, Take 250 mg by mouth 2 (two) times daily with a meal., Disp: , Rfl:  .  cetirizine (ZYRTEC) 10 MG tablet, Take 10 mg by mouth daily., Disp: , Rfl:  .  [DISCONTINUED] oxybutynin (DITROPAN) 5 MG tablet, Take 5 mg by mouth 2 (two) times daily.  , Disp: , Rfl:   PAST MEDICAL HISTORY: Past Medical History  Diagnosis Date  . Depression   . Allergy   . GERD (gastroesophageal reflux disease)   . Hypertension   . Thyroid disease   . MS (multiple sclerosis)   . Sinusitis 08/08/2012  . Other and unspecified hyperlipidemia 08/08/2012  . Allergic state 08/08/2012  . Esophageal reflux 08/08/2012  . Arthritis   .  Preventative health care 08/27/2013  . Vision abnormalities     PAST SURGICAL HISTORY: Past Surgical History  Procedure Laterality Date  . Tonsillectomy    . Tmj arthroscopy    . Hysteroscopy  02, 04, 2008    with D&C    FAMILY HISTORY: Family History  Problem Relation Age of Onset  . Heart disease Mother     pacemaker  . Emphysema Mother   . Hypertension Mother   . Heart disease Father   . Diabetes Father   . Colon cancer Neg Hx   . Esophageal cancer Neg Hx   . Rectal cancer Neg Hx   . Stomach cancer Neg Hx   . Heart disease Sister     cad  . Sleep apnea Brother     SOCIAL HISTORY:  History   Social History  . Marital Status: Single    Spouse Name: N/A  . Number of Children: N/A  . Years of Education: N/A   Occupational History  . Not on file.   Social History Main Topics  . Smoking status: Never Smoker   . Smokeless tobacco: Never Used  . Alcohol Use: Yes      Comment: occassional  . Drug Use: No  . Sexual Activity: Not Currently     Comment: no dietary restrictions, decreased simple sugar   Other Topics Concern  . Not on file   Social History Narrative     PHYSICAL EXAM  Filed Vitals:   08/16/14 0907  BP: 118/78  Pulse: 76  Resp: 16  Height: 5' 6.5" (1.689 m)  Weight: 289 lb 6.4 oz (131.271 kg)    Body mass index is 46.02 kg/(m^2).   General: The patient is well-developed and well-nourished and in no acute distress  Eyes:  Funduscopic exam shows mild left optic disc pallor and normal retinal vessels.  Neck: The neck is supple, no carotid bruits are noted.  The neck is nontender.  Cardiovascular: The heart has a regular rate and rhythm with a normal S1 and S2. There were no murmurs, gallops or rubs. Lungs are clear to auscultation.  Skin: Extremities are without significant edema.  Musculoskeletal:  Back is nontender  Neurologic Exam  Mental status: The patient is alert and oriented x 3 at the time of the examination. The patient has apparent normal recent and remote memory, with an apparently normal attention span and concentration ability.   Speech is normal.  Cranial nerves: Extraocular movements are full. Pupils show a 2+ left APD.   Colors are desaturated out of OS.  Visual fields are full.  Facial symmetry is present. There is good facial sensation to soft touch bilaterally.Facial strength is normal.  Trapezius and sternocleidomastoid strength is normal. No dysarthria is noted.  The tongue is midline, and the patient has symmetric elevation of the soft palate. No obvious hearing deficits are noted.  Motor:  Muscle bulk is normal.   Tone is minimally increased in right leg. Strength is  5 / 5 in all 4 extremities.   Sensory: Sensory testing is intact to pinprick, soft touch and vibration sensation in all 4 extremities.  Coordination: Cerebellar testing reveals good finger-nose-finger and mildly reduced heel-to-shin  bilaterally.  Gait and station: Station is normal.   Gait is normal. Tandem gait is moderately wide. Romberg is negative.   Reflexes: Deep tendon reflexes are symmetric and normal bilaterally.   Plantar responses are flexor.    DIAGNOSTIC DATA (LABS, IMAGING, TESTING) - I reviewed patient records,  labs, notes, testing and imaging myself where available.  Lab Results  Component Value Date   WBC 7.2 03/21/2014   HGB 13.1 03/21/2014   HCT 40.0 03/21/2014   MCV 88.4 03/21/2014   PLT 250.0 03/21/2014      Component Value Date/Time   NA 140 03/21/2014 0928   K 4.3 03/21/2014 0928   CL 105 03/21/2014 0928   CO2 23 03/21/2014 0928   GLUCOSE 90 03/21/2014 0928   BUN 19 03/21/2014 0928   CREATININE 1.1 03/21/2014 0928   CREATININE 0.96 08/22/2013 1047   CALCIUM 10.2 03/21/2014 0928   PROT 8.1 03/21/2014 0928   ALBUMIN 3.3* 03/21/2014 0928   AST 20 03/21/2014 0928   ALT 15 03/21/2014 0928   ALKPHOS 78 03/21/2014 0928   BILITOT 0.6 03/21/2014 0928   Lab Results  Component Value Date   CHOL 189 08/22/2013   HDL 54 08/22/2013   LDLCALC 121* 08/22/2013   TRIG 70 08/22/2013   CHOLHDL 3.5 08/22/2013   Lab Results  Component Value Date   HGBA1C 5.8 01/02/2009   No results found for: [id number] Lab Results  Component Value Date   TSH 0.568 08/22/2013       ASSESSMENT AND PLAN  Multiple sclerosis  Depression  Overactive bladder  Insomnia  Optic neuritis  Ataxic gait  Other fatigue    In summary, Sydney Pace is a 59 year old woman with multiple sclerosis diagnosed many years ago who has been stable off of disease modifying therapy. I had reviewed her MRI from 2015 and it was unchanged when compared to the MRI from 2011. She had only been on medication a few months during that time. We will continue to try to maximize her symptomatic therapy. Ritalin has helped her fatigue and cognition and I will increase the dose from 20 mg twice a day to 20 mg up to 3 times  a day. She will continue on sertraline for her depression and oxybutynin for her bladder. These medications will be renewed. The myalgias are helped by meloxicam and she takes it just directly. I will also renew that.  She will return to see me in 4 or 5 months or sooner if she has new or worsening neurologic symptoms.   Sydney Pace A. Felecia Shelling, MD, PhD 5/72/6203, 5:59 AM Certified in Neurology, Clinical Neurophysiology, Sleep Medicine, Pain Medicine and Neuroimaging  [facility] [address], [place], [place] [address] [phone]

## 2014-08-17 ENCOUNTER — Telehealth: Payer: Self-pay | Admitting: Neurology

## 2014-08-17 NOTE — Telephone Encounter (Signed)
LMTC./fim 

## 2014-08-17 NOTE — Telephone Encounter (Signed)
Pt is calling stating yesterday her and Dr. Felecia Shelling discussed a sleep medication that she could try.  She does not recall what it was.  She wants to know if something can be called in to [facility] on [address].  Please advise.

## 2014-08-20 MED ORDER — GABAPENTIN 600 MG PO TABS: 600.0000 mg | ORAL_TABLET | Freq: Every evening | ORAL | Status: DC | PRN

## 2014-08-20 NOTE — Telephone Encounter (Signed)
Spoke with Kendrick Fries and per RAS, offered Gabapentin 600mg  qhs prn.  She is agreeable to trying this.  Rx. escribed to [facility] on [address]. per her request/fim

## 2014-11-04 ENCOUNTER — Other Ambulatory Visit: Payer: Self-pay | Admitting: Family Medicine

## 2014-11-07 ENCOUNTER — Other Ambulatory Visit: Payer: Self-pay | Admitting: Neurology

## 2014-11-07 MED ORDER — METHYLPHENIDATE HCL 20 MG PO TABS: 20.0000 mg | ORAL_TABLET | Freq: Three times a day (TID) | ORAL | Status: DC

## 2014-11-07 NOTE — Telephone Encounter (Signed)
Request entered, forwarded to provider for approval.  

## 2014-11-07 NOTE — Telephone Encounter (Signed)
Patient called requesting refill for methylphenidate (RITALIN) 20 MG tablet. Patient advised RX will be ready within 24hours unless informed otherwise by RN. Patient can be reached at [phone].

## 2014-11-14 ENCOUNTER — Telehealth: Payer: Self-pay | Admitting: Family Medicine

## 2014-11-14 NOTE — Telephone Encounter (Signed)
Pre Visit letter sent  °

## 2014-12-06 ENCOUNTER — Encounter: Payer: Self-pay | Admitting: Family Medicine

## 2014-12-06 ENCOUNTER — Ambulatory Visit (INDEPENDENT_AMBULATORY_CARE_PROVIDER_SITE_OTHER): Payer: Medicare Other | Admitting: Medical

## 2014-12-06 ENCOUNTER — Encounter: Payer: Self-pay | Admitting: Medical

## 2014-12-06 ENCOUNTER — Telehealth: Payer: Self-pay | Admitting: Family Medicine

## 2014-12-06 VITALS — BP 135/91 | HR 75 | Temp 98.6°F | Ht 66.5 in | Wt 284.8 lb

## 2014-12-06 DIAGNOSIS — F329 Major depressive disorder, single episode, unspecified: Secondary | ICD-10-CM | POA: Diagnosis not present

## 2014-12-06 DIAGNOSIS — F32A Depression, unspecified: Secondary | ICD-10-CM

## 2014-12-06 MED ORDER — SERTRALINE HCL 100 MG PO TABS: 100.0000 mg | ORAL_TABLET | Freq: Every day | ORAL | Status: DC

## 2014-12-06 NOTE — Patient Instructions (Signed)
Depression Some worsening past month and some association with passing of her uncle. On sertraline 50 mg for one year. Pt wants to know if can increase dose. I do think this is reasonable. Will rx sertraline 100 mg a day. If signs or symptoms worsen notify us. If any severe worsening depression(suicidal thought or thought hurting others) then ED evaluation.  Encourage continued attendance of church.  Follow up in 2 wks or as needed

## 2014-12-06 NOTE — Progress Notes (Signed)
Subjective:    Patient ID: Sydney Pace, female    DOB: [birth year], 59 y.o.   MRN: [id number]  HPI   Depression- pt states not good past month. Pt states she has hx of depression. But past month started to feel depressed.Mood started to get worse around time her uncle passed. He passed 2-3 month ago. Her uncle was 74 yo. Also pt has some personal problems. Pt states she has been on sertraline 50 mg for about a year. She wants to know if I can increase dose. Pt states some crying bouts. July 4th cried all day. Brothers and sisters to support.   Pt states does not need any refills on other meds but about to run out of sertralline.    Review of Systems  Constitutional: Negative for fever, chills, diaphoresis, activity change and fatigue.  Respiratory: Negative for cough, chest tightness and shortness of breath.   Cardiovascular: Negative for chest pain, palpitations and leg swelling.  Gastrointestinal: Negative for nausea, vomiting and abdominal pain.  Musculoskeletal: Negative for neck pain and neck stiffness.  Neurological: Negative for dizziness, tremors, seizures, syncope, facial asymmetry, speech difficulty, weakness, light-headedness, numbness and headaches.  Psychiatric/Behavioral: Positive for dysphoric mood. Negative for suicidal ideas, behavioral problems, confusion, sleep disturbance and agitation. The patient is not nervous/anxious.        No homicidal ideation    Past Medical History  Diagnosis Date  . Depression   . Allergy   . GERD (gastroesophageal reflux disease)   . Hypertension   . Thyroid disease   . MS (multiple sclerosis)   . Sinusitis 08/08/2012  . Other and unspecified hyperlipidemia 08/08/2012  . Allergic state 08/08/2012  . Esophageal reflux 08/08/2012  . Arthritis   . Preventative health care 08/27/2013  . Vision abnormalities     History   Social History  . Marital Status: Single    Spouse Name: N/A  . Number of Children: N/A  . Years of  Education: N/A   Occupational History  . Not on file.   Social History Main Topics  . Smoking status: Never Smoker   . Smokeless tobacco: Never Used  . Alcohol Use: Yes     Comment: occassional  . Drug Use: No  . Sexual Activity: Not Currently     Comment: no dietary restrictions, decreased simple sugar   Other Topics Concern  . Not on file   Social History Narrative    Past Surgical History  Procedure Laterality Date  . Tonsillectomy    . Tmj arthroscopy    . Hysteroscopy  02, 04, 2008    with D&C    Family History  Problem Relation Age of Onset  . Heart disease Mother     pacemaker  . Emphysema Mother   . Hypertension Mother   . Heart disease Father   . Diabetes Father   . Colon cancer Neg Hx   . Esophageal cancer Neg Hx   . Rectal cancer Neg Hx   . Stomach cancer Neg Hx   . Heart disease Sister     cad  . Sleep apnea Brother     No Known Allergies  Current Outpatient Prescriptions on File Prior to Visit  Medication Sig Dispense Refill  . calcium carbonate 200 MG capsule Take 250 mg by mouth 2 (two) times daily with a meal.    . cetirizine (ZYRTEC) 10 MG tablet Take 10 mg by mouth daily.    . Cholecalciferol (VITAMIN D PO) Take by mouth.    Marland Kitchen  Docusate Calcium (STOOL SOFTENER PO) Take by mouth daily as needed.    . furosemide (LASIX) 20 MG tablet TAKE ONE TABLET BY MOUTH IN THE MORNING AS NEEDED FOR  LEG  SWELLING 90 tablet 0  . gabapentin (NEURONTIN) 600 MG tablet Take 1 tablet (600 mg total) by mouth at bedtime as needed. 30 tablet 3  . lisinopril-hydrochlorothiazide (PRINZIDE,ZESTORETIC) 20-25 MG per tablet TAKE ONE TABLET BY MOUTH ONCE DAILY 90 tablet 0  . meloxicam (MOBIC) 15 MG tablet Take 1 tablet (15 mg total) by mouth daily as needed for pain. With food 30 tablet 11  . methylphenidate (RITALIN) 20 MG tablet Take 1 tablet (20 mg total) by mouth 3 (three) times daily. 90 tablet 0  . Multiple Vitamin (MULTIVITAMIN) tablet Take 1 tablet by mouth daily.     Marland Kitchen oxybutynin (DITROPAN) 5 MG tablet Take 1 tablet (5 mg total) by mouth 2 (two) times daily. 60 tablet 11  . sertraline (ZOLOFT) 50 MG tablet Take one or two pills daily 60 tablet 11  . sucralfate (CARAFATE) 1 G tablet Take 1 tablet (1 g total) by mouth 4 (four) times daily -  with meals and at bedtime. 90 tablet 1  . amoxicillin-clarithromycin-lansoprazole (PREVPAC) combo pack Take by mouth 2 (two) times daily. Follow package directions. Stop Pantoprazole while on this PrevPak . Resume when completed. (Patient not taking: Reported on 12/06/2014) 1 kit 0  . bismuth-metronidazole-tetracycline (PYLERA) 140-125-125 MG per capsule Take 3 capsules by mouth 4 (four) times daily -  before meals and at bedtime. (Patient not taking: Reported on 12/06/2014) 120 capsule 0  . bismuth-metronidazole-tetracycline (PYLERA) 140-125-125 MG per capsule Take 3 capsules by mouth 4 (four) times daily -  before meals and at bedtime. (Patient not taking: Reported on 12/06/2014) 120 capsule 0  . pantoprazole (PROTONIX) 40 MG tablet TAKE ONE TABLET BY MOUTH ONCE DAILY (Patient not taking: Reported on 12/06/2014) 30 tablet 3  . [DISCONTINUED] oxybutynin (DITROPAN) 5 MG tablet Take 5 mg by mouth 2 (two) times daily.       No current facility-administered medications on file prior to visit.    BP 135/91 mmHg  Pulse 75  Temp(Src) 98.6 F (37 C) (Oral)  Ht 5' 6.5" (1.689 m)  Wt 284 lb 12.8 oz (129.184 kg)  BMI 45.28 kg/m2  SpO2 96%       Objective:   Physical Exam  General Mental Status- Alert. General Appearance- Not in acute distress.   Skin General: Color- Normal Color. Moisture- Normal Moisture.  Neck No jvd.  Chest and Lung Exam Auscultation: Breath Sounds:-Normal. CTA  Cardiovascular Auscultation:Rythm- Regular, Rate and Rhythm. Murmurs & Other Heart Sounds:Auscultation of the heart reveals- No Murmurs.  . Neurologic Cranial Nerve exam:- CN III-XII intact(No nystagmus), symmetric smile. Drift Test:-  No drift. Finger to Nose:- Normal/Intact Strength:- 5/5 equal and symmetric strength both upper and lower extremities.      Assessment & Plan:

## 2014-12-06 NOTE — Telephone Encounter (Signed)
Looks like this was already taken care of.

## 2014-12-06 NOTE — Assessment & Plan Note (Signed)
Some worsening past month and some association with passing of her uncle. On sertraline 50 mg for one year. Pt wants to know if can increase dose. I do think this is reasonable. Will rx sertraline 100 mg a day. If signs or symptoms worsen notify us. If any severe worsening depression(suicidal thought or thought hurting others) then ED evaluation.  Encourage continued attendance of church.  Follow up in 2 wks or as needed

## 2014-12-06 NOTE — Telephone Encounter (Signed)
Caller name: Dayami Relation to pt: self Call back [phone] Pharmacy:  Reason for call: Pt came in office stating had appt today, in chart appt was canceled by the automated system, pt stated wanted appt since needed meds and was feeling very depress, also CPE was needed as well, pt does not want to wait so far out for next appt, if she can be schedule sooner. Please advise.

## 2014-12-06 NOTE — Progress Notes (Signed)
Pre visit review using our clinic review tool, if applicable. No additional management support is needed unless otherwise documented below in the visit note. 

## 2014-12-07 ENCOUNTER — Telehealth: Payer: Self-pay | Admitting: *Deleted

## 2014-12-07 NOTE — Telephone Encounter (Signed)
Unable to reach patient at time of Pre-Visit Call.  Left message for patient to return call when available.    

## 2014-12-10 ENCOUNTER — Ambulatory Visit (INDEPENDENT_AMBULATORY_CARE_PROVIDER_SITE_OTHER): Payer: Medicare Other | Admitting: Family Medicine

## 2014-12-10 ENCOUNTER — Encounter: Payer: Self-pay | Admitting: Family Medicine

## 2014-12-10 VITALS — BP 118/72 | HR 92 | Temp 98.4°F | Ht 67.0 in | Wt 288.0 lb

## 2014-12-10 DIAGNOSIS — I10 Essential (primary) hypertension: Secondary | ICD-10-CM | POA: Diagnosis not present

## 2014-12-10 DIAGNOSIS — G35 Multiple sclerosis: Secondary | ICD-10-CM

## 2014-12-10 DIAGNOSIS — G4700 Insomnia, unspecified: Secondary | ICD-10-CM

## 2014-12-10 DIAGNOSIS — E041 Nontoxic single thyroid nodule: Secondary | ICD-10-CM

## 2014-12-10 DIAGNOSIS — Z1211 Encounter for screening for malignant neoplasm of colon: Secondary | ICD-10-CM

## 2014-12-10 DIAGNOSIS — Z0000 Encounter for general adult medical examination without abnormal findings: Secondary | ICD-10-CM | POA: Diagnosis not present

## 2014-12-10 DIAGNOSIS — E782 Mixed hyperlipidemia: Secondary | ICD-10-CM

## 2014-12-10 DIAGNOSIS — K219 Gastro-esophageal reflux disease without esophagitis: Secondary | ICD-10-CM

## 2014-12-10 DIAGNOSIS — Z1239 Encounter for other screening for malignant neoplasm of breast: Secondary | ICD-10-CM

## 2014-12-10 LAB — COMPREHENSIVE METABOLIC PANEL
ALBUMIN: 3.8 g/dL (ref 3.5–5.2)
ALK PHOS: 88 U/L (ref 39–117)
ALT: 12 U/L (ref 0–35)
AST: 18 U/L (ref 0–37)
BUN: 21 mg/dL (ref 6–23)
CO2: 30 meq/L (ref 19–32)
Calcium: 10.1 mg/dL (ref 8.4–10.5)
Chloride: 102 mEq/L (ref 96–112)
Creatinine, Ser: 1.03 mg/dL (ref 0.40–1.20)
GFR: 70.31 mL/min (ref >60.00)
GLUCOSE: 101 mg/dL — AB (ref 70–99)
Potassium: 3.9 mEq/L (ref 3.5–5.1)
Sodium: 137 mEq/L (ref 135–145)
TOTAL PROTEIN: 8.1 g/dL (ref 6.0–8.3)
Total Bilirubin: 0.3 mg/dL (ref 0.2–1.2)

## 2014-12-10 LAB — LIPID PANEL
CHOLESTEROL: 166 mg/dL (ref 0–200)
HDL: 48.8 mg/dL (ref >39.00)
LDL CALC: 109 mg/dL — AB (ref 0–99)
NonHDL: 117.2
TRIGLYCERIDES: 40 mg/dL (ref 0.0–149.0)
Total CHOL/HDL Ratio: 3
VLDL: 8 mg/dL (ref 0.0–40.0)

## 2014-12-10 LAB — CBC
HCT: 38.3 % (ref 36.0–46.0)
Hemoglobin: 12.8 g/dL (ref 12.0–15.0)
MCHC: 33.4 g/dL (ref 30.0–36.0)
MCV: 86.9 fl (ref 78.0–100.0)
PLATELETS: 278 10*3/uL (ref 150.0–400.0)
RBC: 4.4 Mil/uL (ref 3.87–5.11)
RDW: 14.8 % (ref 11.5–15.5)
WBC: 7.5 10*3/uL (ref 4.0–10.5)

## 2014-12-10 LAB — TSH: TSH: 0.58 u[IU]/mL (ref 0.35–4.50)

## 2014-12-10 MED ORDER — RANITIDINE HCL 300 MG PO TABS: 300.0000 mg | ORAL_TABLET | Freq: Every day | ORAL | Status: DC

## 2014-12-10 NOTE — Assessment & Plan Note (Signed)
Is doing better on Gabapentin at the present time

## 2014-12-10 NOTE — Progress Notes (Signed)
Sydney Pace  [id number] 03/11/1956 12/10/2014      Progress Note-Follow Up  Subjective  Chief Complaint  Chief Complaint  Patient presents with  . Annual Exam    HPI  Patient is a 59 y.o. female in today for routine medical care. Patient is struggling with reflux on occasion despite Protonix use. Moving bowels daily. Has been struggling with anhedonia and stress due to sudden death of her maternal uncle recently and then a paternal aunt. No suicidal or homicidal ideation. Denies CP/palp/SOB/HA/congestion/fevers or GU c/o. Taking meds as prescribed. No nausea or vomiting.  Past Medical History  Diagnosis Date  . Depression   . Allergy   . GERD (gastroesophageal reflux disease)   . Hypertension   . Thyroid disease   . MS (multiple sclerosis)   . Sinusitis 08/08/2012  . Other and unspecified hyperlipidemia 08/08/2012  . Allergic state 08/08/2012  . Esophageal reflux 08/08/2012  . Arthritis   . Preventative health care 08/27/2013  . Vision abnormalities     Past Surgical History  Procedure Laterality Date  . Tonsillectomy    . Tmj arthroscopy    . Hysteroscopy  02, 04, 2008    with D&C    Family History  Problem Relation Age of Onset  . Heart disease Mother     pacemaker  . Emphysema Mother   . Hypertension Mother   . Heart disease Father   . Diabetes Father   . Colon cancer Neg Hx   . Esophageal cancer Neg Hx   . Rectal cancer Neg Hx   . Stomach cancer Neg Hx   . Heart disease Sister     cad  . Sleep apnea Brother     History   Social History  . Marital Status: Single    Spouse Name: N/A  . Number of Children: N/A  . Years of Education: N/A   Occupational History  . Not on file.   Social History Main Topics  . Smoking status: Never Smoker   . Smokeless tobacco: Never Used  . Alcohol Use: Yes     Comment: occassional  . Drug Use: No  . Sexual Activity: Not Currently     Comment: no dietary restrictions, decreased simple sugar   Other Topics  Concern  . Not on file   Social History Narrative    Current Outpatient Prescriptions on File Prior to Visit  Medication Sig Dispense Refill  . calcium carbonate 200 MG capsule Take 250 mg by mouth 2 (two) times daily with a meal.    . cetirizine (ZYRTEC) 10 MG tablet Take 10 mg by mouth daily.    . Cholecalciferol (VITAMIN D PO) Take by mouth.    Mariane Baumgarten Calcium (STOOL SOFTENER PO) Take by mouth daily as needed.    . furosemide (LASIX) 20 MG tablet TAKE ONE TABLET BY MOUTH IN THE MORNING AS NEEDED FOR  LEG  SWELLING 90 tablet 0  . gabapentin (NEURONTIN) 600 MG tablet Take 1 tablet (600 mg total) by mouth at bedtime as needed. 30 tablet 3  . lisinopril-hydrochlorothiazide (PRINZIDE,ZESTORETIC) 20-25 MG per tablet TAKE ONE TABLET BY MOUTH ONCE DAILY 90 tablet 0  . meloxicam (MOBIC) 15 MG tablet Take 1 tablet (15 mg total) by mouth daily as needed for pain. With food 30 tablet 11  . methylphenidate (RITALIN) 20 MG tablet Take 1 tablet (20 mg total) by mouth 3 (three) times daily. 90 tablet 0  . Multiple Vitamin (MULTIVITAMIN) tablet Take 1 tablet by  mouth daily.    Marland Kitchen oxybutynin (DITROPAN) 5 MG tablet Take 1 tablet (5 mg total) by mouth 2 (two) times daily. 60 tablet 11  . sertraline (ZOLOFT) 100 MG tablet Take 1 tablet (100 mg total) by mouth daily. 30 tablet 3  . bismuth-metronidazole-tetracycline (PYLERA) 140-125-125 MG per capsule Take 3 capsules by mouth 4 (four) times daily -  before meals and at bedtime. (Patient not taking: Reported on 12/06/2014) 120 capsule 0  . bismuth-metronidazole-tetracycline (PYLERA) 140-125-125 MG per capsule Take 3 capsules by mouth 4 (four) times daily -  before meals and at bedtime. (Patient not taking: Reported on 12/06/2014) 120 capsule 0  . sucralfate (CARAFATE) 1 G tablet Take 1 tablet (1 g total) by mouth 4 (four) times daily -  with meals and at bedtime. (Patient not taking: Reported on 12/10/2014) 90 tablet 1  . [DISCONTINUED] oxybutynin (DITROPAN) 5 MG  tablet Take 5 mg by mouth 2 (two) times daily.       No current facility-administered medications on file prior to visit.    No Known Allergies  Review of Systems  Review of Systems  Constitutional: Positive for malaise/fatigue. Negative for fever and chills.  HENT: Negative for congestion, hearing loss and nosebleeds.   Eyes: Negative for discharge.  Respiratory: Negative for cough, sputum production, shortness of breath and wheezing.   Cardiovascular: Negative for chest pain, palpitations and leg swelling.  Gastrointestinal: Negative for heartburn, nausea, vomiting, abdominal pain, diarrhea, constipation and blood in stool.  Genitourinary: Negative for dysuria, urgency, frequency and hematuria.  Musculoskeletal: Positive for joint pain. Negative for myalgias, back pain and falls.  Skin: Negative for rash.  Neurological: Negative for dizziness, tremors, sensory change, focal weakness, loss of consciousness, weakness and headaches.  Endo/Heme/Allergies: Negative for polydipsia. Does not bruise/bleed easily.  Psychiatric/Behavioral: Positive for depression. Negative for suicidal ideas. The patient is nervous/anxious and has insomnia.     Objective  BP 118/72 mmHg  Pulse 92  Temp(Src) 98.4 F (36.9 C) (Oral)  Ht 5\' 7"  (1.702 m)  Wt 288 lb (130.636 kg)  BMI 45.10 kg/m2  SpO2 97%  Physical Exam  Physical Exam  Constitutional: She is oriented to person, place, and time and well-developed, well-nourished, and in no distress. No distress.  HENT:  Head: Normocephalic and atraumatic.  Right Ear: External ear normal.  Left Ear: External ear normal.  Nose: Nose normal.  Mouth/Throat: Oropharynx is clear and moist. No oropharyngeal exudate.  Eyes: Conjunctivae are normal. Pupils are equal, round, and reactive to light. Right eye exhibits no discharge. Left eye exhibits no discharge. No scleral icterus.  Neck: Normal range of motion. Neck supple. No thyromegaly present.    Cardiovascular: Normal rate, regular rhythm, normal heart sounds and intact distal pulses.   No murmur heard. Pulmonary/Chest: Effort normal and breath sounds normal. No respiratory distress. She has no wheezes. She has no rales.  Abdominal: Soft. Bowel sounds are normal. She exhibits no distension and no mass. There is no tenderness.  Musculoskeletal: Normal range of motion. She exhibits no edema or tenderness.  Lymphadenopathy:    She has no cervical adenopathy.  Neurological: She is alert and oriented to person, place, and time. She has normal reflexes. No cranial nerve deficit. Coordination normal.  Skin: Skin is warm and dry. No rash noted. She is not diaphoretic.  Psychiatric: Mood, memory and affect normal.    Lab Results  Component Value Date   TSH 0.568 08/22/2013   Lab Results  Component Value Date  WBC 7.2 03/21/2014   HGB 13.1 03/21/2014   HCT 40.0 03/21/2014   MCV 88.4 03/21/2014   PLT 250.0 03/21/2014   Lab Results  Component Value Date   CREATININE 1.1 03/21/2014   BUN 19 03/21/2014   NA 140 03/21/2014   K 4.3 03/21/2014   CL 105 03/21/2014   CO2 23 03/21/2014   Lab Results  Component Value Date   ALT 15 03/21/2014   AST 20 03/21/2014   ALKPHOS 78 03/21/2014   BILITOT 0.6 03/21/2014   Lab Results  Component Value Date   CHOL 189 08/22/2013   Lab Results  Component Value Date   HDL 54 08/22/2013   Lab Results  Component Value Date   LDLCALC 121* 08/22/2013   Lab Results  Component Value Date   TRIG 70 08/22/2013   Lab Results  Component Value Date   CHOLHDL 3.5 08/22/2013     Assessment & Plan  Preventative health care Patient encouraged to maintain heart healthy diet, regular exercise, adequate sleep. Consider daily probiotics. Take medications as prescribed. Check labs today. Given and reviewed copy of ACP documents from [place] and encouraged to complete and return  Multiple sclerosis Is following with neurology at  [facility] now has switched to [place]. Has appt next month  Insomnia Is doing better on Gabapentin at the present time  Esophageal reflux Is not taking Protonix consistently and continues to have burning and reflux numerous days a week.gets temporarily relief then it recurs. Will stop PPI and start Ranitidine 300 mg tab qhs, check H Pylori in 2 weeks and then follow up with gastroenterology. Avoid offending foods, take probiotics. Do not eat large meals in late evening and consider raising head of bed.   Hypertension Well controlled, no changes to meds. Encouraged heart healthy diet such as the DASH diet and exercise as tolerated.   Hyperlipidemia, mixed Encouraged heart healthy diet, increase exercise, avoid trans fats, consider a krill oil cap daily

## 2014-12-10 NOTE — Assessment & Plan Note (Signed)
Encouraged heart healthy diet, increase exercise, avoid trans fats, consider a krill oil cap daily 

## 2014-12-10 NOTE — Progress Notes (Signed)
Pre visit review using our clinic review tool, if applicable. No additional management support is needed unless otherwise documented below in the visit note. 

## 2014-12-10 NOTE — Assessment & Plan Note (Signed)
Patient encouraged to maintain heart healthy diet, regular exercise, adequate sleep. Consider daily probiotics. Take medications as prescribed. Check labs today. Given and reviewed copy of ACP documents from  [employer] [place] and encouraged to complete and return 

## 2014-12-10 NOTE — Assessment & Plan Note (Addendum)
Is following with neurology at [facility] now has switched to [place]. Has appt next month

## 2014-12-10 NOTE — Assessment & Plan Note (Signed)
Is not taking Protonix consistently and continues to have burning and reflux numerous days a week.gets temporarily relief then it recurs. Will stop PPI and start Ranitidine 300 mg tab qhs, check H Pylori in 2 weeks and then follow up with gastroenterology. Avoid offending foods, take probiotics. Do not eat large meals in late evening and consider raising head of bed.

## 2014-12-10 NOTE — Assessment & Plan Note (Signed)
Well controlled, no changes to meds. Encouraged heart healthy diet such as the DASH diet and exercise as tolerated.  °

## 2014-12-10 NOTE — Patient Instructions (Signed)
Take a daily probiotics such as Digestive Advantage or [place] for Gastroesophageal Reflux Disease When you have gastroesophageal reflux disease (GERD), the foods you eat and your eating habits are very important. Choosing the right foods can help ease your discomfort.  WHAT GUIDELINES DO I NEED TO FOLLOW?   Choose fruits, vegetables, whole grains, and low-fat dairy products.   Choose low-fat meat, fish, and poultry.  Limit fats such as oils, salad dressings, butter, nuts, and avocado.   Keep a food diary. This helps you identify foods that cause symptoms.   Avoid foods that cause symptoms. These may be different for everyone.   Eat small meals often instead of 3 large meals a day.   Eat your meals slowly, in a place where you are relaxed.   Limit fried foods.   Cook foods using methods other than frying.   Avoid drinking alcohol.   Avoid drinking large amounts of liquids with your meals.   Avoid bending over or lying down until 2-3 hours after eating.  WHAT FOODS ARE NOT RECOMMENDED?  These are some foods and drinks that may make your symptoms worse: Vegetables Tomatoes. Tomato juice. Tomato and spaghetti sauce. Chili peppers. Onion and garlic. Horseradish. Fruits Oranges, grapefruit, and lemon (fruit and juice). Meats High-fat meats, fish, and poultry. This includes hot dogs, ribs, ham, sausage, salami, and bacon. Dairy Whole milk and chocolate milk. Sour cream. Cream. Butter. Ice cream. Cream cheese.  Drinks Coffee and tea. Bubbly (carbonated) drinks or energy drinks. Condiments Hot sauce. Barbecue sauce.  Sweets/Desserts Chocolate and cocoa. Donuts. Peppermint and spearmint. Fats and Oils High-fat foods. This includes [place] fries and potato chips. Other Vinegar. Strong spices. This includes black pepper, white pepper, red pepper, cayenne, curry powder, cloves, ginger, and chili powder. The items listed above may not be a  complete list of foods and drinks to avoid. Contact your dietitian for more information. Document Released: 11/17/2011 Document Revised: 05/23/2013 Document Reviewed: 03/22/2013 [facility] Patient Information 2015 [place], [place]. This information is not intended to replace advice given to you by your health care provider. Make sure you discuss any questions you have with your health care provider.

## 2015-01-16 ENCOUNTER — Ambulatory Visit (INDEPENDENT_AMBULATORY_CARE_PROVIDER_SITE_OTHER): Payer: Medicare Other | Admitting: Neurology

## 2015-01-16 ENCOUNTER — Encounter: Payer: Self-pay | Admitting: Neurology

## 2015-01-16 VITALS — BP 136/88 | HR 74 | Resp 18 | Ht 67.0 in | Wt 280.6 lb

## 2015-01-16 DIAGNOSIS — R260 Ataxic gait: Secondary | ICD-10-CM | POA: Diagnosis not present

## 2015-01-16 DIAGNOSIS — R5383 Other fatigue: Secondary | ICD-10-CM | POA: Diagnosis not present

## 2015-01-16 DIAGNOSIS — H469 Unspecified optic neuritis: Secondary | ICD-10-CM | POA: Diagnosis not present

## 2015-01-16 DIAGNOSIS — F32A Depression, unspecified: Secondary | ICD-10-CM

## 2015-01-16 DIAGNOSIS — F329 Major depressive disorder, single episode, unspecified: Secondary | ICD-10-CM | POA: Diagnosis not present

## 2015-01-16 DIAGNOSIS — G35 Multiple sclerosis: Secondary | ICD-10-CM | POA: Diagnosis not present

## 2015-01-16 MED ORDER — METHYLPHENIDATE HCL 20 MG PO TABS: 20.0000 mg | ORAL_TABLET | Freq: Three times a day (TID) | ORAL | Status: DC

## 2015-01-16 MED ORDER — OXYBUTYNIN CHLORIDE 5 MG PO TABS: 5.0000 mg | ORAL_TABLET | Freq: Two times a day (BID) | ORAL | Status: DC

## 2015-01-16 NOTE — Progress Notes (Signed)
[facility]  PATIENT: Sydney Pace DOB: 31-Oct-1955  REFERRING DOCTOR OR PCP:  Penni Homans  _________________________________   HISTORICAL  CHIEF COMPLAINT:  Chief Complaint  Patient presents with  . Fibromyalgia    Sts. has been more depressed due to deaths in her family, breakup of relationship with her boyfriend.  Sts. she still is only taking 1/2 of Gabapentin at bedtime due to excessive drowsiness the next day.  She goes to sleep ok with this, but is having trouble staying asleep.  Another provider is managing her depression/fim  . Pain    HISTORY OF PRESENT ILLNESS:  Sydney Pace is a 59 year old woman with multiple sclerosis.   She has done extremely well off medications the past few years.     Previously, she was on Copaxone, Avonex and Gilenya but felt worse with all of them.   No exacerbations and 09/2013 MRI showed no changes.   Her depression is doing much worse after the death of 2 uncles.      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  MRI was performed in 09-24-81 and was consistent with MS.     In the late 1990's, she was started on Copaxone but stopped after several weeks due to skin reactions. She also tried Avonex but had a rash and stopped. She started Gilenya in January 2014 but stopped after 7 or 8 months due to being more fatigue and having flulike symptoms. In May 2015 we had discussed Aubagio. She decided not to start.  She had an MRI of the brain with and without contrast on 10/27/2013 and I reviewed the study. It showed white matter foci in a pattern consistent with MS. There was no enhancement. When compared to an MRI dated 11/08/2009, there was no interval change.  Gait/strength/sensation:   Gait is ok though she has some difficulty climbing steps because her right leg tires out easily. She also gets muscle aches in both legs. Legs are stiff, especially if she is sitting or laying for  long time. If painful, she takes a meloxicam.   She denies any numbness or tingling in her arms or legs.  Bladder:  She has urinary urgency with frequency.  Oxybutynin has helped and she tolerates it ok - has some dryness. She does not feel that she completely empties. She often has to go back to the bathroom several minutes after she leaves. No recent urinary tract infections.  Vision/vertigo:  Her vertigo has resolved.  Vision has not changed this year.  She had left ON in past.  In 2010-09-25, she had visual field testing showing a right superior homonymous field cut.     Fatigue/sleep: She has fatigue that she feels is more physical and cognitive. She has received some benefit from methylphenidate 20 mg tid.   Nuvigil had not helped her much.   She has some difficulty with sleep maintenance  insomnia but is doing better with gabapentin -- now cut dose to 300 mg nightly with continued benefit and less hangover.  .   She snores. No one has ever told her that she has apnea.  Mood/cognition: Depression is worse. She feels is affects her ability to concentrate.    She sees Dr. Charlett Blake who just doubled Zoloft to 100 mg.   In 09-25-2011, her mother died and she had a major depression that worsened in 2012/09/24.   She notes some mild cognitive dysfunction.  Specifically there is some difficulty with short-term memory, verbal fluency and processing speed.    REVIEW OF SYSTEMS: Constitutional: No fevers, chills, sweats, or change in appetite.   Notes fatigue Eyes: see above.  No double vision, eye pain Ear, nose and throat: No hearing loss, ear pain, nasal congestion, sore throat Cardiovascular: No chest pain, palpitations Respiratory: No shortness of breath at rest or with exertion.   No wheezes GastrointestinaI: No nausea, vomiting, diarrhea, abdominal pain, fecal incontinence Genitourinary: Mild urinary frequency.  No nocturia. Musculoskeletal: No neck pain, back pain Integumentary: No rash, pruritus, skin  lesions Neurological: as above Psychiatric: Only mild depression at this time, rarely cries, less anxiety Endocrine: No palpitations, diaphoresis, change in appetite, change in weigh or increased thirst Hematologic/Lymphatic: No anemia, purpura, petechiae. Allergic/Immunologic: No itchy/runny eyes, nasal congestion, recent allergic reactions, rashes  ALLERGIES: No Known Allergies  HOME MEDICATIONS:  Current outpatient prescriptions:  .  bismuth-metronidazole-tetracycline (PYLERA) 140-125-125 MG per capsule, Take 3 capsules by mouth 4 (four) times daily -  before meals and at bedtime., Disp: 120 capsule, Rfl: 0 .  calcium carbonate 200 MG capsule, Take 250 mg by mouth 2 (two) times daily with a meal., Disp: , Rfl:  .  cetirizine (ZYRTEC) 10 MG tablet, Take 10 mg by mouth daily., Disp: , Rfl:  .  Cholecalciferol (VITAMIN D PO), Take by mouth., Disp: , Rfl:  .  Docusate Calcium (STOOL SOFTENER PO), Take by mouth daily as needed., Disp: , Rfl:  .  furosemide (LASIX) 20 MG tablet, TAKE ONE TABLET BY MOUTH IN THE MORNING AS NEEDED FOR  LEG  SWELLING, Disp: 90 tablet, Rfl: 0 .  gabapentin (NEURONTIN) 600 MG tablet, Take 1 tablet (600 mg total) by mouth at bedtime as needed., Disp: 30 tablet, Rfl: 3 .  lisinopril-hydrochlorothiazide (PRINZIDE,ZESTORETIC) 20-25 MG per tablet, TAKE ONE TABLET BY MOUTH ONCE DAILY, Disp: 90 tablet, Rfl: 0 .  meloxicam (MOBIC) 15 MG tablet, Take 1 tablet (15 mg total) by mouth daily as needed for pain. With food, Disp: 30 tablet, Rfl: 11 .  methylphenidate (RITALIN) 20 MG tablet, Take 1 tablet (20 mg total) by mouth 3 (three) times daily., Disp: 90 tablet, Rfl: 0 .  Multiple Vitamin (MULTIVITAMIN) tablet, Take 1 tablet by mouth daily., Disp: , Rfl:  .  oxybutynin (DITROPAN) 5 MG tablet, Take 1 tablet (5 mg total) by mouth 2 (two) times daily., Disp: 60 tablet, Rfl: 11 .  ranitidine (ZANTAC) 300 MG tablet, Take 1 tablet (300 mg total) by mouth at bedtime., Disp: 30  tablet, Rfl: 5 .  sertraline (ZOLOFT) 100 MG tablet, Take 1 tablet (100 mg total) by mouth daily., Disp: 30 tablet, Rfl: 3 .  sucralfate (CARAFATE) 1 G tablet, Take 1 tablet (1 g total) by mouth 4 (four) times daily -  with meals and at bedtime. (Patient not taking: Reported on 12/10/2014), Disp: 90 tablet, Rfl: 1 .  [DISCONTINUED] oxybutynin (DITROPAN) 5 MG tablet, Take 5 mg by mouth 2 (two) times daily.  , Disp: , Rfl:   PAST MEDICAL HISTORY: Past Medical History  Diagnosis Date  . Depression   . Allergy   . GERD (gastroesophageal reflux disease)   . Hypertension   . Thyroid disease   . MS (multiple sclerosis)   . Sinusitis 08/08/2012  . Other and unspecified hyperlipidemia 08/08/2012  . Allergic state 08/08/2012  . Esophageal reflux 08/08/2012  . Arthritis   . Preventative health care 08/27/2013  . Vision abnormalities  PAST SURGICAL HISTORY: Past Surgical History  Procedure Laterality Date  . Tonsillectomy    . Tmj arthroscopy    . Hysteroscopy  02, 04, 2008    with D&C    FAMILY HISTORY: Family History  Problem Relation Age of Onset  . Heart disease Mother     pacemaker  . Emphysema Mother   . Hypertension Mother   . Heart disease Father   . Diabetes Father   . Colon cancer Neg Hx   . Esophageal cancer Neg Hx   . Rectal cancer Neg Hx   . Stomach cancer Neg Hx   . Heart disease Sister     cad  . Sleep apnea Brother     SOCIAL HISTORY:  Social History   Social History  . Marital Status: Single    Spouse Name: N/A  . Number of Children: N/A  . Years of Education: N/A   Occupational History  . Not on file.   Social History Main Topics  . Smoking status: Never Smoker   . Smokeless tobacco: Never Used  . Alcohol Use: Yes     Comment: occassional  . Drug Use: No  . Sexual Activity: Not Currently     Comment: no dietary restrictions, decreased simple sugar   Other Topics Concern  . Not on file   Social History Narrative     PHYSICAL  EXAM  Filed Vitals:   01/16/15 0925  BP: 136/88  Pulse: 74  Resp: 18  Height: 5\' 7"  (1.702 m)  Weight: 280 lb 9.6 oz (127.279 kg)    Body mass index is 43.94 kg/(m^2).   General: The patient is well-developed and well-nourished and in no acute distress  Neurologic Exam  Mental status: The patient is alert and oriented x 3 at the time of the examination. The patient has apparent normal recent and remote memory, with an apparently normal attention span and concentration ability.   Speech is normal.  Cranial nerves: Extraocular movements are full. Pupils show a 2+ left APD.   Colors are desaturated out of OS.  There is good facial sensation to soft touch bilaterally.Facial strength is normal.  Trapezius and sternocleidomastoid strength is normal. No dysarthria is noted.    No obvious hearing deficits are noted.  Motor:  Muscle bulk is normal.   Tone is minimally increased in right leg. Strength is  5 / 5 in all 4 extremities.   Sensory: Sensory testing is intact to pinprick, soft touch and vibration sensation in all 4 extremities.  Coordination: Cerebellar testing reveals good finger-nose-finger bilaterally.  Gait and station: Station is normal.   Gait is normal. Tandem gait is moderately wide. Romberg is negative.   Reflexes: Deep tendon reflexes are symmetric and normal bilaterally.        DIAGNOSTIC DATA (LABS, IMAGING, TESTING) - I reviewed patient records, labs, notes, testing and imaging myself where available.  Lab Results  Component Value Date   WBC 7.5 12/10/2014   HGB 12.8 12/10/2014   HCT 38.3 12/10/2014   MCV 86.9 12/10/2014   PLT 278.0 12/10/2014      Component Value Date/Time   NA 137 12/10/2014 1003   K 3.9 12/10/2014 1003   CL 102 12/10/2014 1003   CO2 30 12/10/2014 1003   GLUCOSE 101* 12/10/2014 1003   BUN 21 12/10/2014 1003   CREATININE 1.03 12/10/2014 1003   CREATININE 0.96 08/22/2013 1047   CALCIUM 10.1 12/10/2014 1003   PROT 8.1 12/10/2014  1003  ALBUMIN 3.8 12/10/2014 1003   AST 18 12/10/2014 1003   ALT 12 12/10/2014 1003   ALKPHOS 88 12/10/2014 1003   BILITOT 0.3 12/10/2014 1003   Lab Results  Component Value Date   CHOL 166 12/10/2014   HDL 48.80 12/10/2014   LDLCALC 109* 12/10/2014   TRIG 40.0 12/10/2014   CHOLHDL 3 12/10/2014   Lab Results  Component Value Date   HGBA1C 5.8 01/02/2009   No results found for: [id number] Lab Results  Component Value Date   TSH 0.58 12/10/2014       ASSESSMENT AND PLAN  Multiple sclerosis  Optic neuritis  Depression  Ataxic gait  Other fatigue   1.   Continue Ritalin and take 300 mg gabapentin for fatigue and sleep 2.   Continue zoloft for depression.   Ritalin likely helps some too 3.   Renew oxybutynin for bladder 4.  Exercise as tolerated   She will return to see me in 4 or 5 months or sooner if she has new or worsening neurologic symptoms.   Richard A. Felecia Shelling, MD, PhD 4/97/0263, 78:58 PM Certified in Neurology, Clinical Neurophysiology, Sleep Medicine, Pain Medicine and Neuroimaging  [facility] [address] [place], [place] [address] [phone]

## 2015-02-14 ENCOUNTER — Telehealth: Payer: Self-pay | Admitting: *Deleted

## 2015-02-14 NOTE — Telephone Encounter (Signed)
[place].  I received a health and wellness program to complete for her and have a question about it./fim

## 2015-02-15 NOTE — Telephone Encounter (Signed)
Pt returned call from nurse

## 2015-02-15 NOTE — Telephone Encounter (Signed)
I have spoken with Kendrick Fries.  She sts. she will be participating in water fitness.  I have added this to her [place] paperwork.  Per Judeth's request, paperwork placed up front [facility] for her to pick up on Monday/fim

## 2015-02-15 NOTE — Telephone Encounter (Signed)
LMTC./fim 

## 2015-03-15 ENCOUNTER — Encounter: Payer: Self-pay | Admitting: Family Medicine

## 2015-03-15 ENCOUNTER — Ambulatory Visit (INDEPENDENT_AMBULATORY_CARE_PROVIDER_SITE_OTHER): Payer: Medicare Other | Admitting: Family Medicine

## 2015-03-15 VITALS — BP 130/78 | HR 80 | Temp 98.2°F | Ht 67.0 in | Wt 280.2 lb

## 2015-03-15 DIAGNOSIS — G4700 Insomnia, unspecified: Secondary | ICD-10-CM

## 2015-03-15 DIAGNOSIS — G35 Multiple sclerosis: Secondary | ICD-10-CM

## 2015-03-15 DIAGNOSIS — G35D Multiple sclerosis, unspecified: Secondary | ICD-10-CM

## 2015-03-15 DIAGNOSIS — Z23 Encounter for immunization: Secondary | ICD-10-CM

## 2015-03-15 DIAGNOSIS — B9681 Helicobacter pylori [H. pylori] as the cause of diseases classified elsewhere: Secondary | ICD-10-CM

## 2015-03-15 DIAGNOSIS — E782 Mixed hyperlipidemia: Secondary | ICD-10-CM | POA: Diagnosis not present

## 2015-03-15 DIAGNOSIS — A048 Other specified bacterial intestinal infections: Secondary | ICD-10-CM

## 2015-03-15 DIAGNOSIS — I10 Essential (primary) hypertension: Secondary | ICD-10-CM | POA: Diagnosis not present

## 2015-03-15 DIAGNOSIS — K219 Gastro-esophageal reflux disease without esophagitis: Secondary | ICD-10-CM

## 2015-03-15 MED ORDER — RANITIDINE HCL 300 MG PO TABS: 300.0000 mg | ORAL_TABLET | Freq: Every evening | ORAL | Status: DC | PRN

## 2015-03-15 NOTE — Progress Notes (Signed)
Pre visit review using our clinic review tool, if applicable. No additional management support is needed unless otherwise documented below in the visit note. 

## 2015-03-15 NOTE — Patient Instructions (Signed)
Hypertension Hypertension, commonly called high blood pressure, is when the force of blood pumping through your arteries is too strong. Your arteries are the blood vessels that carry blood from your heart throughout your body. A blood pressure reading consists of a higher number over a lower number, such as 110/72. The higher number (systolic) is the pressure inside your arteries when your heart pumps. The lower number (diastolic) is the pressure inside your arteries when your heart relaxes. Ideally you want your blood pressure below 120/80. Hypertension forces your heart to work harder to pump blood. Your arteries may become narrow or stiff. Having untreated or uncontrolled hypertension can cause heart attack, stroke, kidney disease, and other problems. RISK FACTORS Some risk factors for high blood pressure are controllable. Others are not.  Risk factors you cannot control include:   Race. You may be at higher risk if you are African American.  Age. Risk increases with age.  Gender. Men are at higher risk than women before age 45 years. After age 65, women are at higher risk than men. Risk factors you can control include:  Not getting enough exercise or physical activity.  Being overweight.  Getting too much fat, sugar, calories, or salt in your diet.  Drinking too much alcohol. SIGNS AND SYMPTOMS Hypertension does not usually cause signs or symptoms. Extremely high blood pressure (hypertensive crisis) may cause headache, anxiety, shortness of breath, and nosebleed. DIAGNOSIS To check if you have hypertension, your health care provider will measure your blood pressure while you are seated, with your arm held at the level of your heart. It should be measured at least twice using the same arm. Certain conditions can cause a difference in blood pressure between your right and left arms. A blood pressure reading that is higher than normal on one occasion does not mean that you need treatment. If  it is not clear whether you have high blood pressure, you may be asked to return on a different day to have your blood pressure checked again. Or, you may be asked to monitor your blood pressure at home for 1 or more weeks. TREATMENT Treating high blood pressure includes making lifestyle changes and possibly taking medicine. Living a healthy lifestyle can help lower high blood pressure. You may need to change some of your habits. Lifestyle changes may include:  Following the DASH diet. This diet is high in fruits, vegetables, and whole grains. It is low in salt, red meat, and added sugars.  Keep your sodium intake below 2,300 mg per day.  Getting at least 30-45 minutes of aerobic exercise at least 4 times per week.  Losing weight if necessary.  Not smoking.  Limiting alcoholic beverages.  Learning ways to reduce stress. Your health care provider may prescribe medicine if lifestyle changes are not enough to get your blood pressure under control, and if one of the following is true:  You are 18-59 years of age and your systolic blood pressure is above 140.  You are 60 years of age or older, and your systolic blood pressure is above 150.  Your diastolic blood pressure is above 90.  You have diabetes, and your systolic blood pressure is over 140 or your diastolic blood pressure is over 90.  You have kidney disease and your blood pressure is above 140/90.  You have heart disease and your blood pressure is above 140/90. Your personal target blood pressure may vary depending on your medical conditions, your age, and other factors. HOME CARE INSTRUCTIONS    Have your blood pressure rechecked as directed by your health care provider.   Take medicines only as directed by your health care provider. Follow the directions carefully. Blood pressure medicines must be taken as prescribed. The medicine does not work as well when you skip doses. Skipping doses also puts you at risk for  problems.  Do not smoke.   Monitor your blood pressure at home as directed by your health care provider. SEEK MEDICAL CARE IF:   You think you are having a reaction to medicines taken.  You have recurrent headaches or feel dizzy.  You have swelling in your ankles.  You have trouble with your vision. SEEK IMMEDIATE MEDICAL CARE IF:  You develop a severe headache or confusion.  You have unusual weakness, numbness, or feel faint.  You have severe chest or abdominal pain.  You vomit repeatedly.  You have trouble breathing. MAKE SURE YOU:   Understand these instructions.  Will watch your condition.  Will get help right away if you are not doing well or get worse.   This information is not intended to replace advice given to you by your health care provider. Make sure you discuss any questions you have with your health care provider.   Document Released: 05/18/2005 Document Revised: 10/02/2014 Document Reviewed: 03/10/2013 Elsevier Interactive Patient Education 2016 [employer]  

## 2015-03-24 NOTE — Assessment & Plan Note (Signed)
Encouraged heart healthy diet, increase exercise, avoid trans fats, consider a krill oil cap daily 

## 2015-03-24 NOTE — Assessment & Plan Note (Signed)
Encouraged good sleep hygiene such as dark, quiet room. No blue/green glowing lights such as computer screens in bedroom. No alcohol or stimulants in evening. Cut down on caffeine as able. Regular exercise is helpful but not just prior to bed time.  

## 2015-03-24 NOTE — Assessment & Plan Note (Signed)
Well treated feeling better

## 2015-03-24 NOTE — Assessment & Plan Note (Signed)
Avoid offending foods, start probiotics. Do not eat large meals in late evening and consider raising head of bed.  

## 2015-03-24 NOTE — Progress Notes (Signed)
Subjective:    Patient ID: Sydney Pace, female    DOB: 09-03-1955, 58 y.o.   MRN: [id number]  Chief Complaint  Patient presents with  . Follow-up    HPI Patient is in today for follow-up. She is doing well. 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 No recent illness. Did have some recent sinus congestion but that is resolved. No acute concerns. Denies CP/palp/SOB/HA/congestion/fevers/GI or GU c/o. Taking meds as prescribed  Past Medical History  Diagnosis Date  . Depression   . Allergy   . GERD (gastroesophageal reflux disease)   . Hypertension   . Thyroid disease   . MS (multiple sclerosis) ([place])   . Sinusitis 08/08/2012  . Other and unspecified hyperlipidemia 08/08/2012  . Allergic state 08/08/2012  . Esophageal reflux 08/08/2012  . Arthritis   . Preventative health care 08/27/2013  . Vision abnormalities     Past Surgical History  Procedure Laterality Date  . Tonsillectomy    . Tmj arthroscopy    . Hysteroscopy  02, 04, 2008    with D&C    Family History  Problem Relation Age of Onset  . Heart disease Mother     pacemaker  . Emphysema Mother   . Hypertension Mother   . Heart disease Father   . Diabetes Father   . Colon cancer Neg Hx   . Esophageal cancer Neg Hx   . Rectal cancer Neg Hx   . Stomach cancer Neg Hx   . Heart disease Sister     cad  . Sleep apnea Brother     Social History   Social History  . Marital Status: Single    Spouse Name: N/A  . Number of Children: N/A  . Years of Education: N/A   Occupational History  . Not on file.   Social History Main Topics  . Smoking status: Never Smoker   . Smokeless tobacco: Never Used  . Alcohol Use: Yes     Comment: occassional  . Drug Use: No  . Sexual Activity: Not Currently     Comment: no dietary restrictions, decreased simple sugar   Other Topics Concern  . Not on file   Social History Narrative    Outpatient Prescriptions Prior to Visit  Medication Sig  Dispense Refill  . calcium carbonate 200 MG capsule Take 250 mg by mouth 2 (two) times daily with a meal.    . cetirizine (ZYRTEC) 10 MG tablet Take 10 mg by mouth daily.    . Cholecalciferol (VITAMIN D PO) Take by mouth.    Mariane Baumgarten Calcium (STOOL SOFTENER PO) Take by mouth daily as needed.    . furosemide (LASIX) 20 MG tablet TAKE ONE TABLET BY MOUTH IN THE MORNING AS NEEDED FOR  LEG  SWELLING 90 tablet 0  . gabapentin (NEURONTIN) 600 MG tablet Take 1 tablet (600 mg total) by mouth at bedtime as needed. 30 tablet 3  . lisinopril-hydrochlorothiazide (PRINZIDE,ZESTORETIC) 20-25 MG per tablet TAKE ONE TABLET BY MOUTH ONCE DAILY 90 tablet 0  . meloxicam (MOBIC) 15 MG tablet Take 1 tablet (15 mg total) by mouth daily as needed for pain. With food 30 tablet 11  . methylphenidate (RITALIN) 20 MG tablet Take 1 tablet (20 mg total) by mouth 3 (three) times daily. 90 tablet 0  . Multiple Vitamin (MULTIVITAMIN) tablet Take 1 tablet by mouth daily.    Marland Kitchen oxybutynin (DITROPAN) 5 MG tablet Take 1 tablet (5 mg total) by  mouth 2 (two) times daily. 60 tablet 11  . sertraline (ZOLOFT) 100 MG tablet Take 1 tablet (100 mg total) by mouth daily. 30 tablet 3  . bismuth-metronidazole-tetracycline (PYLERA) 140-125-125 MG per capsule Take 3 capsules by mouth 4 (four) times daily -  before meals and at bedtime. 120 capsule 0  . ranitidine (ZANTAC) 300 MG tablet Take 1 tablet (300 mg total) by mouth at bedtime. 30 tablet 5  . sucralfate (CARAFATE) 1 G tablet Take 1 tablet (1 g total) by mouth 4 (four) times daily -  with meals and at bedtime. (Patient not taking: Reported on 03/15/2015) 90 tablet 1   No facility-administered medications prior to visit.    No Known Allergies  Review of Systems  Constitutional: Negative for fever and malaise/fatigue.  HENT: Negative for congestion.   Eyes: Negative for discharge.  Respiratory: Negative for shortness of breath.   Cardiovascular: Negative for chest pain, palpitations  and leg swelling.  Gastrointestinal: Negative for nausea and abdominal pain.  Genitourinary: Negative for dysuria.  Musculoskeletal: Negative for falls.  Skin: Negative for rash.  Neurological: Negative for loss of consciousness and headaches.  Endo/Heme/Allergies: Negative for environmental allergies.  Psychiatric/Behavioral: Negative for depression. The patient is not nervous/anxious.        Objective:    Physical Exam  Constitutional: She is oriented to person, place, and time. She appears well-developed and well-nourished. No distress.  HENT:  Head: Normocephalic and atraumatic.  Nose: Nose normal.  Eyes: Right eye exhibits no discharge. Left eye exhibits no discharge.  Neck: Normal range of motion. Neck supple.  Cardiovascular: Normal rate and regular rhythm.   No murmur heard. Pulmonary/Chest: Effort normal and breath sounds normal.  Abdominal: Soft. Bowel sounds are normal. There is no tenderness.  Musculoskeletal: She exhibits no edema.  Neurological: She is alert and oriented to person, place, and time.  Skin: Skin is warm and dry.  Psychiatric: She has a normal mood and affect.  Nursing note and vitals reviewed.   BP 130/78 mmHg  Pulse 80  Temp(Src) 98.2 F (36.8 C) (Oral)  Ht 5\' 7"  (1.702 m)  Wt 280 lb 3.2 oz (127.098 kg)  BMI 43.88 kg/m2  SpO2 100% Wt Readings from Last 3 Encounters:  03/15/15 280 lb 3.2 oz (127.098 kg)  01/16/15 280 lb 9.6 oz (127.279 kg)  12/10/14 288 lb (130.636 kg)     Lab Results  Component Value Date   WBC 7.5 12/10/2014   HGB 12.8 12/10/2014   HCT 38.3 12/10/2014   PLT 278.0 12/10/2014   GLUCOSE 101* 12/10/2014   CHOL 166 12/10/2014   TRIG 40.0 12/10/2014   HDL 48.80 12/10/2014   LDLCALC 109* 12/10/2014   ALT 12 12/10/2014   AST 18 12/10/2014   NA 137 12/10/2014   K 3.9 12/10/2014   CL 102 12/10/2014   CREATININE 1.03 12/10/2014   BUN 21 12/10/2014   CO2 30 12/10/2014   TSH 0.58 12/10/2014   HGBA1C 5.8 01/02/2009    MICROALBUR 1.17 01/02/2009    Lab Results  Component Value Date   TSH 0.58 12/10/2014   Lab Results  Component Value Date   WBC 7.5 12/10/2014   HGB 12.8 12/10/2014   HCT 38.3 12/10/2014   MCV 86.9 12/10/2014   PLT 278.0 12/10/2014   Lab Results  Component Value Date   NA 137 12/10/2014   K 3.9 12/10/2014   CO2 30 12/10/2014   GLUCOSE 101* 12/10/2014   BUN 21 12/10/2014  CREATININE 1.03 12/10/2014   BILITOT 0.3 12/10/2014   ALKPHOS 88 12/10/2014   AST 18 12/10/2014   ALT 12 12/10/2014   PROT 8.1 12/10/2014   ALBUMIN 3.8 12/10/2014   CALCIUM 10.1 12/10/2014   GFR 70.31 12/10/2014   Lab Results  Component Value Date   CHOL 166 12/10/2014   Lab Results  Component Value Date   HDL 48.80 12/10/2014   Lab Results  Component Value Date   LDLCALC 109* 12/10/2014   Lab Results  Component Value Date   TRIG 40.0 12/10/2014   Lab Results  Component Value Date   CHOLHDL 3 12/10/2014   Lab Results  Component Value Date   HGBA1C 5.8 01/02/2009       Assessment & Plan:   Problem List Items Addressed This Visit    Multiple sclerosis ([place])    Continues to be asymptomatic, no new concerns      Insomnia    Encouraged good sleep hygiene such as dark, quiet room. No blue/green glowing lights such as computer screens in bedroom. No alcohol or stimulants in evening. Cut down on caffeine as able. Regular exercise is helpful but not just prior to bed time.       Hypertension    Well controlled, no changes to meds. Encouraged heart healthy diet such as the DASH diet and exercise as tolerated. Given flu shot today      Hyperlipidemia, mixed    Encouraged heart healthy diet, increase exercise, avoid trans fats, consider a krill oil cap daily      Helicobacter pylori (H. pylori) infection    Well treated feeling better      Esophageal reflux    Avoid offending foods, start probiotics. Do not eat large meals in late evening and consider raising head of bed.         Relevant Medications   ranitidine (ZANTAC) 300 MG tablet    Other Visit Diagnoses    Need for prophylactic vaccination and inoculation against influenza    -  Primary    Relevant Orders    Flu Vaccine QUAD 36+ mos PF IM (Fluarix & Fluzone Quad PF) (Completed)       I have discontinued Ms. Winski's sucralfate and bismuth-metronidazole-tetracycline. I have also changed her ranitidine. Additionally, I am having her maintain her Cholecalciferol (VITAMIN D PO), multivitamin, Docusate Calcium (STOOL SOFTENER PO), cetirizine, calcium carbonate, furosemide, meloxicam, gabapentin, lisinopril-hydrochlorothiazide, sertraline, methylphenidate, oxybutynin, and brimonidine.  Meds ordered this encounter  Medications  . brimonidine (ALPHAGAN) 0.2 % ophthalmic solution    Sig: 3 (three) times daily.  . ranitidine (ZANTAC) 300 MG tablet    Sig: Take 1 tablet (300 mg total) by mouth at bedtime as needed for heartburn.    Dispense:  30 tablet    Refill:  5     Penni Homans, MD

## 2015-03-24 NOTE — Assessment & Plan Note (Signed)
Well controlled, no changes to meds. Encouraged heart healthy diet such as the DASH diet and exercise as tolerated. Given flu shot today 

## 2015-03-24 NOTE — Assessment & Plan Note (Signed)
Continues to be asymptomatic, no new concerns

## 2015-03-27 ENCOUNTER — Other Ambulatory Visit: Payer: Self-pay | Admitting: Neurology

## 2015-03-27 MED ORDER — METHYLPHENIDATE HCL 20 MG PO TABS: 20.0000 mg | ORAL_TABLET | Freq: Three times a day (TID) | ORAL | Status: DC

## 2015-03-27 NOTE — Telephone Encounter (Signed)
Pt needs refill on methylphenidate (RITALIN) 20 MG tablet. Thank you °

## 2015-03-28 ENCOUNTER — Encounter: Payer: Self-pay | Admitting: *Deleted

## 2015-03-28 NOTE — Progress Notes (Signed)
Ritalin rx. up front [facility] 

## 2015-03-29 ENCOUNTER — Other Ambulatory Visit: Payer: Self-pay | Admitting: Family Medicine

## 2015-05-02 ENCOUNTER — Other Ambulatory Visit: Payer: Self-pay | Admitting: Neurology

## 2015-05-21 ENCOUNTER — Encounter: Payer: Self-pay | Admitting: Neurology

## 2015-05-21 ENCOUNTER — Ambulatory Visit (INDEPENDENT_AMBULATORY_CARE_PROVIDER_SITE_OTHER): Payer: Medicare Other | Admitting: Neurology

## 2015-05-21 VITALS — BP 128/80 | HR 87 | Ht 67.0 in | Wt 285.8 lb

## 2015-05-21 DIAGNOSIS — R5382 Chronic fatigue, unspecified: Secondary | ICD-10-CM

## 2015-05-21 DIAGNOSIS — R260 Ataxic gait: Secondary | ICD-10-CM | POA: Diagnosis not present

## 2015-05-21 DIAGNOSIS — G35 Multiple sclerosis: Secondary | ICD-10-CM | POA: Diagnosis not present

## 2015-05-21 DIAGNOSIS — F329 Major depressive disorder, single episode, unspecified: Secondary | ICD-10-CM | POA: Diagnosis not present

## 2015-05-21 DIAGNOSIS — F32A Depression, unspecified: Secondary | ICD-10-CM

## 2015-05-21 DIAGNOSIS — G4700 Insomnia, unspecified: Secondary | ICD-10-CM

## 2015-05-21 MED ORDER — METHYLPHENIDATE HCL 20 MG PO TABS: 20.0000 mg | ORAL_TABLET | Freq: Three times a day (TID) | ORAL | Status: DC

## 2015-05-21 MED ORDER — GABAPENTIN 600 MG PO TABS: ORAL_TABLET | ORAL | Status: DC

## 2015-05-21 MED ORDER — MELOXICAM 15 MG PO TABS: 15.0000 mg | ORAL_TABLET | Freq: Every day | ORAL | Status: DC | PRN

## 2015-05-21 NOTE — Progress Notes (Signed)
u  [facility]  PATIENT: Sydney Pace DOB: 1955-11-18  REFERRING DOCTOR OR PCP:  Penni Homans  _________________________________   HISTORICAL  CHIEF COMPLAINT:  MS  HISTORY OF PRESENT ILLNESS:  Sydney Pace is a 59 year old woman with multiple sclerosis.   She is not on any disease modifying therapy but has had stable MRI's, last one 10/27/2013.     Previously, she was on Copaxone, Avonex and Gilenya but felt worse with all of them.    She denies any new MS symptom though she feels her walking is minimally worse than last year.    Mood is doing worse.     Gait/strength/sensation:   Gait is stable.   She is noting more dysesthesias in her legs like they are on fire.     She has some difficulty climbing steps because her right leg tires out easily. She also gets muscle aches in both legs. Legs are stiff, especially if she is sitting or laying for long time. If painful, she takes a meloxicam.   She denies any numbness or tingling in her arms or legs    Bladder:  She has urinary urgency with frequency, helped by Oxybutynin.   There is mild hesitancy but no recent urinary tract infections.  Vision/vertigo:   Vision has not changed this year.  She had left ON in past and in the past  She had visual field testing showing a right superior homonymous field cut.      Vertigo has resolved.  Fatigue/sleep: She has fatigue that she is both physical and cognitive. She has received some benefit from methylphenidate 20 mg tid.  She has some difficulty with sleep maintenance  insomnia helped by gabapentin 300 mg nightly with continued benefit and less hangover.  .   She snores. No one has ever told her that she has apnea.  Mood/cognition: Depression is the same or mildly worse despite sertraline.   She sees Dr. Charlett Blake. She works 6 days a week and is tired her off day.   She had a major depression that worsened in 2014 and is still an issue.   She notes some mild cognitive dysfunction.  Specifically there is some difficulty with short-term memory, verbal fluency and processing speed.  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  MRI was performed in 1983 and was consistent with MS.     In the late 1990's, she was started on Copaxone but stopped after several weeks due to skin reactions. She also tried Avonex but had a rash and stopped. She started Gilenya in January 2014 but stopped after 7 or 8 months due to being more fatigue and having flulike symptoms. In May 2015 we had discussed Aubagio. She decided not to start.  She had an MRI of the brain with and without contrast on 10/27/2013 and I reviewed the study. It showed white matter foci in a pattern consistent with MS. There was no enhancement. When compared to an MRI dated 11/08/2009, there was no interval change.   REVIEW OF SYSTEMS: Constitutional: No fevers, chills, sweats, or change in appetite.   Notes fatigue.   Insomnia Eyes: see above.  No double vision, eye pain..  Some eye redness Ear, nose and throat: No hearing loss, ear pain, nasal congestion, sore throat Cardiovascular: No chest pain, palpitations Respiratory: No shortness of breath at rest or with exertion.   No wheezes GastrointestinaI: No nausea, vomiting, diarrhea,  abdominal pain, fecal incontinence Genitourinary: Mild urinary frequency.  No nocturia. Musculoskeletal: No neck pain, back pain.  Some hip pain Integumentary: No rash, pruritus, skin lesions Neurological: as above Psychiatric: Mild depression at this time, rarely cries, less anxiety Endocrine: No palpitations, diaphoresis, change in appetite, change in weigh or increased thirst Hematologic/Lymphatic: No anemia, purpura, petechiae. Allergic/Immunologic: No itchy/runny eyes, nasal congestion, recent allergic reactions, rashes  ALLERGIES: No Known Allergies  HOME MEDICATIONS:  Current outpatient prescriptions:  .  brimonidine  (ALPHAGAN) 0.2 % ophthalmic solution, 3 (three) times daily., Disp: , Rfl:  .  calcium carbonate 200 MG capsule, Take 250 mg by mouth 2 (two) times daily with a meal., Disp: , Rfl:  .  cetirizine (ZYRTEC) 10 MG tablet, Take 10 mg by mouth daily., Disp: , Rfl:  .  Cholecalciferol (VITAMIN D PO), Take by mouth., Disp: , Rfl:  .  Docusate Calcium (STOOL SOFTENER PO), Take by mouth daily as needed., Disp: , Rfl:  .  furosemide (LASIX) 20 MG tablet, TAKE ONE TABLET BY MOUTH IN THE MORNING AS NEEDED FOR  LEG  SWELLING, Disp: 90 tablet, Rfl: 0 .  gabapentin (NEURONTIN) 600 MG tablet, Take one or two pills at night, Disp: 60 tablet, Rfl: 5 .  lisinopril-hydrochlorothiazide (PRINZIDE,ZESTORETIC) 20-25 MG tablet, TAKE ONE TABLET BY MOUTH ONCE DAILY, Disp: 90 tablet, Rfl: 1 .  meloxicam (MOBIC) 15 MG tablet, Take 1 tablet (15 mg total) by mouth daily as needed for pain. With food, Disp: 30 tablet, Rfl: 11 .  methylphenidate (RITALIN) 20 MG tablet, Take 1 tablet (20 mg total) by mouth 3 (three) times daily., Disp: 90 tablet, Rfl: 0 .  Multiple Vitamin (MULTIVITAMIN) tablet, Take 1 tablet by mouth daily., Disp: , Rfl:  .  oxybutynin (DITROPAN) 5 MG tablet, Take 1 tablet (5 mg total) by mouth 2 (two) times daily., Disp: 60 tablet, Rfl: 11 .  ranitidine (ZANTAC) 300 MG tablet, Take 1 tablet (300 mg total) by mouth at bedtime as needed for heartburn., Disp: 30 tablet, Rfl: 5 .  sertraline (ZOLOFT) 100 MG tablet, Take 1 tablet (100 mg total) by mouth daily., Disp: 30 tablet, Rfl: 3 .  [DISCONTINUED] oxybutynin (DITROPAN) 5 MG tablet, Take 5 mg by mouth 2 (two) times daily.  , Disp: , Rfl:   PAST MEDICAL HISTORY: Past Medical History  Diagnosis Date  . Depression   . Allergy   . GERD (gastroesophageal reflux disease)   . Hypertension   . Thyroid disease   . MS (multiple sclerosis) ([place])   . Sinusitis 08/08/2012  . Other and unspecified hyperlipidemia 08/08/2012  . Allergic state 08/08/2012  . Esophageal  reflux 08/08/2012  . Arthritis   . Preventative health care 08/27/2013  . Vision abnormalities     PAST SURGICAL HISTORY: Past Surgical History  Procedure Laterality Date  . Tonsillectomy    . Tmj arthroscopy    . Hysteroscopy  02, 04, 2008    with D&C    FAMILY HISTORY: Family History  Problem Relation Age of Onset  . Heart disease Mother     pacemaker  . Emphysema Mother   . Hypertension Mother   . Heart disease Father   . Diabetes Father   . Colon cancer Neg Hx   . Esophageal cancer Neg Hx   . Rectal cancer Neg Hx   . Stomach cancer Neg Hx   . Heart disease Sister     cad  . Sleep apnea Brother     SOCIAL  HISTORY:  Social History   Social History  . Marital Status: Single    Spouse Name: N/A  . Number of Children: N/A  . Years of Education: N/A   Occupational History  . Not on file.   Social History Main Topics  . Smoking status: Never Smoker   . Smokeless tobacco: Never Used  . Alcohol Use: Yes     Comment: occassional  . Drug Use: No  . Sexual Activity: Not Currently     Comment: no dietary restrictions, decreased simple sugar   Other Topics Concern  . Not on file   Social History Narrative     PHYSICAL EXAM  Filed Vitals:   05/21/15 1045  BP: 128/80  Pulse: 87  Height: 5\' 7"  (1.702 m)  Weight: 285 lb 12.8 oz (129.638 kg)    Body mass index is 44.75 kg/(m^2).   General: The patient is well-developed and well-nourished and in no acute distress  Neurologic Exam  Mental status: The patient is alert and oriented x 3 at the time of the examination. The patient has apparent normal recent and remote memory, with an apparently normal attention span and concentration ability.   Speech is normal.  Cranial nerves: Extraocular movements are full. Pupils show a 2+ left APD.   Colors are desaturated OS.  There is good facial sensation to soft touch bilaterally.Facial strength is normal.  Trapezius and sternocleidomastoid strength is normal. No  dysarthria is noted.    No obvious hearing deficits are noted.  Motor:  Muscle bulk is normal.   Tone is minimally increased in right leg. Strength is  5 / 5 in all 4 extremities.   Sensory: Sensory testing is intact to pinprick, soft touch and vibration sensation in all 4 extremities.  Coordination: Cerebellar testing reveals good finger-nose-finger bilaterally.  Gait and station: Station is normal.   Gait is normal. Tandem gait is moderately wide. Romberg is negative.   Reflexes: Deep tendon reflexes are symmetric and normal bilaterally.        DIAGNOSTIC DATA (LABS, IMAGING, TESTING) - I reviewed patient records, labs, notes, testing and imaging myself where available.  Lab Results  Component Value Date   WBC 7.5 12/10/2014   HGB 12.8 12/10/2014   HCT 38.3 12/10/2014   MCV 86.9 12/10/2014   PLT 278.0 12/10/2014      Component Value Date/Time   NA 137 12/10/2014 1003   K 3.9 12/10/2014 1003   CL 102 12/10/2014 1003   CO2 30 12/10/2014 1003   GLUCOSE 101* 12/10/2014 1003   BUN 21 12/10/2014 1003   CREATININE 1.03 12/10/2014 1003   CREATININE 0.96 08/22/2013 1047   CALCIUM 10.1 12/10/2014 1003   PROT 8.1 12/10/2014 1003   ALBUMIN 3.8 12/10/2014 1003   AST 18 12/10/2014 1003   ALT 12 12/10/2014 1003   ALKPHOS 88 12/10/2014 1003   BILITOT 0.3 12/10/2014 1003   Lab Results  Component Value Date   CHOL 166 12/10/2014   HDL 48.80 12/10/2014   LDLCALC 109* 12/10/2014   TRIG 40.0 12/10/2014   CHOLHDL 3 12/10/2014   Lab Results  Component Value Date   HGBA1C 5.8 01/02/2009   No results found for: [id number] Lab Results  Component Value Date   TSH 0.58 12/10/2014       ASSESSMENT AND PLAN  Multiple sclerosis (HCC)  Ataxic gait  Depression  Chronic fatigue  Insomnia   1.   Continue Ritalin. 2.   Increase gabapentin to 300-300-600 if tolerated.  Take meloxicam regularly for pain.    3   Continue zoloft for depression.     4. Exercise as  tolerated 5.    She will return to see me in 5 months or sooner if she has new or worsening neurologic symptoms.   Richard A. Felecia Shelling, MD, PhD [id number], [id number] AM Certified in Neurology, Clinical Neurophysiology, Sleep Medicine, Pain Medicine and Neuroimaging  [facility] [address] [place], [place] [address] [phone]

## 2015-05-24 ENCOUNTER — Ambulatory Visit
Admission: RE | Admit: 2015-05-24 | Discharge: 2015-05-24 | Disposition: A | Discharge status: Home / Self Care | Payer: Medicare Other | Source: Ambulatory Visit | Attending: Family Medicine | Admitting: Family Medicine

## 2015-05-24 DIAGNOSIS — Z1239 Encounter for other screening for malignant neoplasm of breast: Secondary | ICD-10-CM

## 2015-05-28 ENCOUNTER — Ambulatory Visit: Payer: Medicare Other | Admitting: Neurology

## 2015-06-17 ENCOUNTER — Ambulatory Visit (INDEPENDENT_AMBULATORY_CARE_PROVIDER_SITE_OTHER): Payer: Medicare HMO | Admitting: Family Medicine

## 2015-06-17 ENCOUNTER — Encounter: Payer: Self-pay | Admitting: Family Medicine

## 2015-06-17 VITALS — BP 138/88 | HR 88 | Temp 98.6°F | Ht 65.0 in | Wt 279.0 lb

## 2015-06-17 DIAGNOSIS — J329 Chronic sinusitis, unspecified: Secondary | ICD-10-CM | POA: Diagnosis not present

## 2015-06-17 DIAGNOSIS — R059 Cough, unspecified: Secondary | ICD-10-CM

## 2015-06-17 DIAGNOSIS — R079 Chest pain, unspecified: Secondary | ICD-10-CM

## 2015-06-17 DIAGNOSIS — A048 Other specified bacterial intestinal infections: Secondary | ICD-10-CM

## 2015-06-17 DIAGNOSIS — R109 Unspecified abdominal pain: Secondary | ICD-10-CM

## 2015-06-17 DIAGNOSIS — R1013 Epigastric pain: Secondary | ICD-10-CM | POA: Diagnosis not present

## 2015-06-17 DIAGNOSIS — R350 Frequency of micturition: Secondary | ICD-10-CM | POA: Diagnosis not present

## 2015-06-17 DIAGNOSIS — I10 Essential (primary) hypertension: Secondary | ICD-10-CM

## 2015-06-17 DIAGNOSIS — K219 Gastro-esophageal reflux disease without esophagitis: Secondary | ICD-10-CM

## 2015-06-17 DIAGNOSIS — R05 Cough: Secondary | ICD-10-CM

## 2015-06-17 DIAGNOSIS — B9681 Helicobacter pylori [H. pylori] as the cause of diseases classified elsewhere: Secondary | ICD-10-CM

## 2015-06-17 LAB — CBC WITH DIFFERENTIAL/PLATELET
BASOS ABS: 0 10*3/uL (ref 0.0–0.1)
BASOS PCT: 0.4 % (ref 0.0–3.0)
Eosinophils Absolute: 0.2 10*3/uL (ref 0.0–0.7)
Eosinophils Relative: 2.6 % (ref 0.0–5.0)
HEMATOCRIT: 40.5 % (ref 36.0–46.0)
Hemoglobin: 13.4 g/dL (ref 12.0–15.0)
LYMPHS PCT: 23.2 % (ref 12.0–46.0)
Lymphs Abs: 1.8 10*3/uL (ref 0.7–4.0)
MCHC: 33 g/dL (ref 30.0–36.0)
MCV: 87.7 fl (ref 78.0–100.0)
MONOS PCT: 5.4 % (ref 3.0–12.0)
Monocytes Absolute: 0.4 10*3/uL (ref 0.1–1.0)
NEUTROS ABS: 5.4 10*3/uL (ref 1.4–7.7)
Neutrophils Relative %: 68.4 % (ref 43.0–77.0)
PLATELETS: 300 10*3/uL (ref 150.0–400.0)
RBC: 4.62 Mil/uL (ref 3.87–5.11)
RDW: 14.6 % (ref 11.5–15.5)
WBC: 7.8 10*3/uL (ref 4.0–10.5)

## 2015-06-17 LAB — COMPREHENSIVE METABOLIC PANEL
ALK PHOS: 97 U/L (ref 39–117)
ALT: 15 U/L (ref 0–35)
AST: 19 U/L (ref 0–37)
Albumin: 3.9 g/dL (ref 3.5–5.2)
BILIRUBIN TOTAL: 0.4 mg/dL (ref 0.2–1.2)
BUN: 14 mg/dL (ref 6–23)
CALCIUM: 10.2 mg/dL (ref 8.4–10.5)
CO2: 26 meq/L (ref 19–32)
Chloride: 103 mEq/L (ref 96–112)
Creatinine, Ser: 0.98 mg/dL (ref 0.40–1.20)
GFR: 74.34 mL/min (ref >60.00)
Glucose, Bld: 102 mg/dL — ABNORMAL HIGH (ref 70–99)
Potassium: 4.2 mEq/L (ref 3.5–5.1)
Sodium: 139 mEq/L (ref 135–145)
TOTAL PROTEIN: 8.1 g/dL (ref 6.0–8.3)

## 2015-06-17 LAB — URINALYSIS
BILIRUBIN URINE: NEGATIVE
KETONES UR: NEGATIVE
LEUKOCYTES UA: NEGATIVE
Nitrite: NEGATIVE
Specific Gravity, Urine: >=1.03 — AB (ref 1.000–1.030)
Total Protein, Urine: NEGATIVE
UROBILINOGEN UA: 0.2 (ref 0.0–1.0)
Urine Glucose: NEGATIVE
pH: 6 (ref 5.0–8.0)

## 2015-06-17 MED ORDER — HYDROCODONE-HOMATROPINE 5-1.5 MG/5ML PO SYRP: 5.0000 mL | ORAL_SOLUTION | Freq: Three times a day (TID) | ORAL | Status: DC | PRN

## 2015-06-17 MED ORDER — CEFDINIR 300 MG PO CAPS: 300.0000 mg | ORAL_CAPSULE | Freq: Two times a day (BID) | ORAL | Status: AC

## 2015-06-17 NOTE — Progress Notes (Signed)
Pre visit review using our clinic review tool, if applicable. No additional management support is needed unless otherwise documented below in the visit note. 

## 2015-06-17 NOTE — Patient Instructions (Addendum)
Probiotic, Elderberry, Vitamin C Encouraged increased rest and hydration, add probiotics, zinc such as Coldeze or Xicam. Treat fevers as needed  Helicobacter Pylori Antibodies Test WHY AM I HAVING THIS TEST? This is a blood test that looks for bacteria called Helicobacter pylori (H. pylori). H. pylori is a germ that can be found in the cells that line the stomach. Having high levels of H. pylori in your stomach puts you at risk for stomach ulcers and small bowel ulcers, long-term (chronic) inflammation of the lining of the stomach, or even ulcers that may occur in the canal that runs from the mouth to the stomach (esophagus). Presence of H. pylori can also increase your risk for stomach cancer if it is left untreated. Most people with H. pylori in their stomach bacteria have no symptoms. Your health care provider may ask you to have this test if you have symptoms of a stomach ulcer or small bowel ulcer, such as stomach pain before or after eating, heartburn, or nausea repeatedly after eating. WHAT KIND OF SAMPLE IS TAKEN? A blood sample is required for this test. It is usually collected by inserting a needle into a vein or sticking a finger with a small needle. HOW DO I PREPARE FOR THE TEST? There is no preparation required for this test. WHAT ARE THE REFERENCE RANGES? Reference ranges are considered healthy ranges established after testing a large group of healthy people. Reference ranges may vary among different people, labs, and hospitals. It is your responsibility to obtain your test results. Ask the lab or department performing the test when and how you will get your results. Your test results will be reported as positive, negative, or equivocal. Equivocal means that your results are neither positive nor negative. References ranges for these results are as follows:  Less than or equal to 30 units/mL. This is negative.  Greater than or equal to 40 units/mL. This is positive.  30.01-39.99  units/mL. This is equivocal. WHAT DO THE RESULTS MEAN? Test results that are higher than normal may indicate numerous health conditions. These may include:  Short-term or long-term irritation of the stomach lining (gastritis).  Small bowel ulcer.  Stomach ulcer.  Stomach cancer. Talk with your health care provider to discuss your results, treatment options, and if necessary, the need for more tests. Talk with your health care provider if you have any questions about your results.   This information is not intended to replace advice given to you by your health care provider. Make sure you discuss any questions you have with your health care provider.   Document Released: 06/11/2004 Document Revised: 06/08/2014 Document Reviewed: 09/29/2013 Elsevier Interactive Patient Education [employer].

## 2015-06-18 LAB — URINE CULTURE

## 2015-06-18 LAB — H. PYLORI BREATH TEST: H. pylori Breath Test: DETECTED — AB

## 2015-06-21 ENCOUNTER — Ambulatory Visit: Payer: Medicare Other | Admitting: Family Medicine

## 2015-06-23 NOTE — Assessment & Plan Note (Signed)
Recurrent started on Biaxin, Amoxicillin and Omeprazole, if symptoms do not resolve then will need referral to ID

## 2015-06-23 NOTE — Assessment & Plan Note (Signed)
Biaxin and mucinex bid

## 2015-06-23 NOTE — Progress Notes (Signed)
Patient ID: Sydney Pace, female   DOB: 03/07/1956, 60 y.o.   MRN: [id number]   Subjective:    Patient ID: Sydney Pace, female    DOB: 02-06-1956, 60 y.o.   MRN: [id number]  Chief Complaint  Patient presents with  . Nasal Congestion    HPI Patient is in today for evaluation of numerous concerns. She has been struggling with head congestion and now some chest congestion for almost a month. She is yellow rhinorrhea as well as postnasal drip productive of yellow sputum. She's had intermittent fevers and chills but she denies ear pain, throat pain, headache. She does have malaise and myalgias. She does note significant increase in heartburn, dyspepsia and nausea recently. She has increased abdominal pain and reports it is reminiscent of her previous infection with H. pylori. Her bowels are moving daily. Denies CP/palp/SOB/HA/GU c/o. Taking meds as prescribed  Past Medical History  Diagnosis Date  . Depression   . Allergy   . GERD (gastroesophageal reflux disease)   . Hypertension   . Thyroid disease   . MS (multiple sclerosis) ([place])   . Sinusitis 08/08/2012  . Other and unspecified hyperlipidemia 08/08/2012  . Allergic state 08/08/2012  . Esophageal reflux 08/08/2012  . Arthritis   . Preventative health care 08/27/2013  . Vision abnormalities     Past Surgical History  Procedure Laterality Date  . Tonsillectomy    . Tmj arthroscopy    . Hysteroscopy  02, 04, 2008    with D&C    Family History  Problem Relation Age of Onset  . Heart disease Mother     pacemaker  . Emphysema Mother   . Hypertension Mother   . Heart disease Father   . Diabetes Father   . Colon cancer Neg Hx   . Esophageal cancer Neg Hx   . Rectal cancer Neg Hx   . Stomach cancer Neg Hx   . Heart disease Sister     cad  . Sleep apnea Brother     Social History   Social History  . Marital Status: Single    Spouse Name: N/A  . Number of Children: N/A  . Years of Education: N/A   Occupational History    . Not on file.   Social History Main Topics  . Smoking status: Never Smoker   . Smokeless tobacco: Never Used  . Alcohol Use: Yes     Comment: occassional  . Drug Use: No  . Sexual Activity: Not Currently     Comment: no dietary restrictions, decreased simple sugar   Other Topics Concern  . Not on file   Social History Narrative    Outpatient Prescriptions Prior to Visit  Medication Sig Dispense Refill  . brimonidine (ALPHAGAN) 0.2 % ophthalmic solution 3 (three) times daily.    . calcium carbonate 200 MG capsule Take 250 mg by mouth 2 (two) times daily with a meal.    . cetirizine (ZYRTEC) 10 MG tablet Take 10 mg by mouth daily.    . Cholecalciferol (VITAMIN D PO) Take by mouth.    Mariane Baumgarten Calcium (STOOL SOFTENER PO) Take by mouth daily as needed.    . furosemide (LASIX) 20 MG tablet TAKE ONE TABLET BY MOUTH IN THE MORNING AS NEEDED FOR  LEG  SWELLING 90 tablet 0  . gabapentin (NEURONTIN) 600 MG tablet Take one or two pills at night 60 tablet 5  . lisinopril-hydrochlorothiazide (PRINZIDE,ZESTORETIC) 20-25 MG tablet TAKE ONE TABLET BY MOUTH ONCE DAILY 90 tablet  1  . meloxicam (MOBIC) 15 MG tablet Take 1 tablet (15 mg total) by mouth daily as needed for pain. With food 30 tablet 11  . methylphenidate (RITALIN) 20 MG tablet Take 1 tablet (20 mg total) by mouth 3 (three) times daily. 90 tablet 0  . Multiple Vitamin (MULTIVITAMIN) tablet Take 1 tablet by mouth daily.    Marland Kitchen oxybutynin (DITROPAN) 5 MG tablet Take 1 tablet (5 mg total) by mouth 2 (two) times daily. 60 tablet 11  . ranitidine (ZANTAC) 300 MG tablet Take 1 tablet (300 mg total) by mouth at bedtime as needed for heartburn. 30 tablet 5  . sertraline (ZOLOFT) 100 MG tablet Take 1 tablet (100 mg total) by mouth daily. 30 tablet 3   No facility-administered medications prior to visit.    No Known Allergies  Review of Systems  Constitutional: Positive for fever, chills and malaise/fatigue.  HENT: Positive for  congestion.   Eyes: Negative for discharge.  Respiratory: Negative for shortness of breath.   Cardiovascular: Negative for chest pain, palpitations and leg swelling.  Gastrointestinal: Positive for heartburn, nausea and abdominal pain. Negative for diarrhea, constipation, blood in stool and melena.  Genitourinary: Negative for dysuria.  Musculoskeletal: Positive for myalgias. Negative for falls.  Skin: Negative for rash.  Neurological: Negative for loss of consciousness and headaches.  Endo/Heme/Allergies: Negative for environmental allergies.  Psychiatric/Behavioral: Negative for depression. The patient is not nervous/anxious.        Objective:    Physical Exam  Constitutional: She is oriented to person, place, and time. She appears well-developed and well-nourished. No distress.  HENT:  Head: Normocephalic and atraumatic.  Nose: Nose normal.  Eyes: Right eye exhibits no discharge. Left eye exhibits no discharge.  Neck: Normal range of motion. Neck supple.  Cardiovascular: Normal rate and regular rhythm.   No murmur heard. Pulmonary/Chest: Effort normal and breath sounds normal.  Abdominal: Soft. Bowel sounds are normal. There is no tenderness.  Musculoskeletal: She exhibits no edema.  Neurological: She is alert and oriented to person, place, and time.  Skin: Skin is warm and dry.  Psychiatric: She has a normal mood and affect.  Nursing note and vitals reviewed.   BP 138/88 mmHg  Pulse 88  Temp(Src) 98.6 F (37 C) (Oral)  Ht 5\' 5"  (1.651 m)  Wt 279 lb (126.554 kg)  BMI 46.43 kg/m2  SpO2 96% Wt Readings from Last 3 Encounters:  06/17/15 279 lb (126.554 kg)  05/21/15 285 lb 12.8 oz (129.638 kg)  03/15/15 280 lb 3.2 oz (127.098 kg)     Lab Results  Component Value Date   WBC 7.8 06/17/2015   HGB 13.4 06/17/2015   HCT 40.5 06/17/2015   PLT 300.0 06/17/2015   GLUCOSE 102* 06/17/2015   CHOL 166 12/10/2014   TRIG 40.0 12/10/2014   HDL 48.80 12/10/2014   LDLCALC  109* 12/10/2014   ALT 15 06/17/2015   AST 19 06/17/2015   NA 139 06/17/2015   K 4.2 06/17/2015   CL 103 06/17/2015   CREATININE 0.98 06/17/2015   BUN 14 06/17/2015   CO2 26 06/17/2015   TSH 0.58 12/10/2014   HGBA1C 5.8 01/02/2009   MICROALBUR 1.17 01/02/2009    Lab Results  Component Value Date   TSH 0.58 12/10/2014   Lab Results  Component Value Date   WBC 7.8 06/17/2015   HGB 13.4 06/17/2015   HCT 40.5 06/17/2015   MCV 87.7 06/17/2015   PLT 300.0 06/17/2015   Lab Results  Component Value Date   NA 139 06/17/2015   K 4.2 06/17/2015   CO2 26 06/17/2015   GLUCOSE 102* 06/17/2015   BUN 14 06/17/2015   CREATININE 0.98 06/17/2015   BILITOT 0.4 06/17/2015   ALKPHOS 97 06/17/2015   AST 19 06/17/2015   ALT 15 06/17/2015   PROT 8.1 06/17/2015   ALBUMIN 3.9 06/17/2015   CALCIUM 10.2 06/17/2015   GFR 74.34 06/17/2015   Lab Results  Component Value Date   CHOL 166 12/10/2014   Lab Results  Component Value Date   HDL 48.80 12/10/2014   Lab Results  Component Value Date   LDLCALC 109* 12/10/2014   Lab Results  Component Value Date   TRIG 40.0 12/10/2014   Lab Results  Component Value Date   CHOLHDL 3 12/10/2014   Lab Results  Component Value Date   HGBA1C 5.8 01/02/2009       Assessment & Plan:   Problem List Items Addressed This Visit    Esophageal reflux    Avoid offending foods, start probiotics. Do not eat large meals in late evening and consider raising head of bed.       Helicobacter pylori (H. pylori) infection    Recurrent started on Biaxin, Amoxicillin and Omeprazole, if symptoms do not resolve then will need referral to ID      Relevant Medications   cefdinir (OMNICEF) 300 MG capsule   Hypertension    Well controlled, no changes to meds. Encouraged heart healthy diet such as the DASH diet and exercise as tolerated.       Sinusitis    Biaxin and mucinex bid      Relevant Medications   HYDROcodone-homatropine (HYCODAN) 5-1.5 MG/5ML  syrup   cefdinir (OMNICEF) 300 MG capsule   Other Relevant Orders   H. pylori breath test (Completed)   CBC with Differential (Completed)   Comprehensive metabolic panel (Completed)   Urinalysis (Completed)   Urine culture (Completed)   DG Chest 2 View   DG Abd 2 Views    Other Visit Diagnoses    Urinary frequency    -  Primary    Relevant Orders    H. pylori breath test (Completed)    CBC with Differential (Completed)    Comprehensive metabolic panel (Completed)    Urinalysis (Completed)    Urine culture (Completed)    Abdominal pain, unspecified abdominal location        Relevant Orders    H. pylori breath test (Completed)    CBC with Differential (Completed)    Comprehensive metabolic panel (Completed)    Urinalysis (Completed)    Urine culture (Completed)    Abdominal pain, epigastric        Relevant Orders    H. pylori breath test (Completed)    CBC with Differential (Completed)    Comprehensive metabolic panel (Completed)    Urinalysis (Completed)    Urine culture (Completed)    Chest pain, unspecified chest pain type        Relevant Orders    DG Chest 2 View    Cough        Relevant Orders    DG Chest 2 View       I am having Ms. Dice start on HYDROcodone-homatropine and cefdinir. I am also having her maintain her Cholecalciferol (VITAMIN D PO), multivitamin, Docusate Calcium (STOOL SOFTENER PO), cetirizine, calcium carbonate, furosemide, sertraline, oxybutynin, brimonidine, ranitidine, lisinopril-hydrochlorothiazide, methylphenidate, gabapentin, and meloxicam.  Meds ordered this encounter  Medications  .  HYDROcodone-homatropine (HYCODAN) 5-1.5 MG/5ML syrup    Sig: Take 5 mLs by mouth every 8 (eight) hours as needed for cough.    Dispense:  120 mL    Refill:  0  . cefdinir (OMNICEF) 300 MG capsule    Sig: Take 1 capsule (300 mg total) by mouth 2 (two) times daily.    Dispense:  20 capsule    Refill:  0     Penni Homans, MD

## 2015-06-23 NOTE — Assessment & Plan Note (Signed)
Well controlled, no changes to meds. Encouraged heart healthy diet such as the DASH diet and exercise as tolerated.  °

## 2015-06-23 NOTE — Assessment & Plan Note (Signed)
Avoid offending foods, start probiotics. Do not eat large meals in late evening and consider raising head of bed.  

## 2015-06-24 ENCOUNTER — Encounter: Payer: Self-pay | Admitting: Gynecology

## 2015-06-24 ENCOUNTER — Ambulatory Visit (INDEPENDENT_AMBULATORY_CARE_PROVIDER_SITE_OTHER): Payer: Medicare HMO | Admitting: Gynecology

## 2015-06-24 VITALS — BP 138/88 | Ht 65.0 in | Wt 281.0 lb

## 2015-06-24 DIAGNOSIS — N952 Postmenopausal atrophic vaginitis: Secondary | ICD-10-CM

## 2015-06-24 DIAGNOSIS — Z01419 Encounter for gynecological examination (general) (routine) without abnormal findings: Secondary | ICD-10-CM

## 2015-06-24 NOTE — Patient Instructions (Signed)

## 2015-06-24 NOTE — Progress Notes (Signed)
Sydney Pace 15-Apr-1956 [id number]        59 y.o.  G3P0030  for breast and pelvic exam  Past medical history,surgical history, problem list, medications, allergies, family history and social history were all reviewed and documented as reviewed in the EPIC chart.  ROS:  Performed with pertinent positives and negatives included in the history, assessment and plan.   Additional significant findings :  none   Exam: Caryn Bee assistant Filed Vitals:   06/24/15 0854  BP: 138/88  Height: 5\' 5"  (1.651 m)  Weight: 281 lb (127.461 kg)   General appearance:  Normal affect, orientation and appearance. Skin: Grossly normal HEENT: Without gross lesions.  No cervical or supraclavicular adenopathy. Thyroid normal.  Lungs:  Clear without wheezing, rales or rhonchi Cardiac: RR, without RMG Abdominal:  Soft, nontender, without masses, guarding, rebound, organomegaly or hernia Breasts:  Examined lying and sitting without masses, retractions, discharge or axillary adenopathy. Pelvic:  Ext/BUS/vagina with atrophic changes  Cervix atrophic  Uterus retroverted, normal size, shape and contour, midline and mobile nontender   Adnexa  Without masses or tenderness    Anus and perineum  Normal   Rectovaginal  Normal sphincter tone without palpated masses or tenderness.    Assessment/Plan:  60 y.o. G70P0030 female for breast and pelvic exam  1. Postmenopausal/atrophic genital changes. Without significant symptoms of hot flashes, night sweats, vaginal dryness or any vaginal bleeding. Continue to monitor and report any issues or vaginal bleeding. 2. Mammography 05/2015. Continue with annual mammography when due. 3. Pap smear/HPV negative 05/2012. No Pap smear done today.  Repeat at 5 year per current screening guidelines.  No history of abnormal Pap smears. 4. DEXA 2014 normal. Plan repeat at 5 year interval. 5. Colonoscopy due this year and I reminded her to call and schedule and she agrees to do  so. 6. Health maintenance. No routine lab work done as patient does [place] at her primary physician's office. Is being followed for hypertension. Blood pressure 138/88 noted to the patient. Follow up in one year, sooner as needed.   Anastasio Auerbach MD, 9:15 AM 06/24/2015

## 2015-07-01 ENCOUNTER — Other Ambulatory Visit: Payer: Self-pay | Admitting: Medical

## 2015-08-09 ENCOUNTER — Other Ambulatory Visit: Payer: Self-pay | Admitting: Medical

## 2015-08-22 ENCOUNTER — Telehealth: Payer: Self-pay | Admitting: Neurology

## 2015-08-22 MED ORDER — METHYLPHENIDATE HCL 20 MG PO TABS: 20.0000 mg | ORAL_TABLET | Freq: Three times a day (TID) | ORAL | Status: DC

## 2015-08-22 NOTE — Telephone Encounter (Signed)
Rx printed and on desk waiting for signature

## 2015-08-22 NOTE — Telephone Encounter (Signed)
Pt is requesting a refill methylphenidate (RITALIN) 20 MG tablet

## 2015-08-22 NOTE — Telephone Encounter (Signed)
Ritalin rx. up front [facility]/fim 

## 2015-09-12 ENCOUNTER — Other Ambulatory Visit: Payer: Self-pay

## 2015-09-12 MED ORDER — SERTRALINE HCL 100 MG PO TABS: 100.0000 mg | ORAL_TABLET | Freq: Every day | ORAL | Status: DC

## 2015-10-04 DIAGNOSIS — R69 Illness, unspecified: Secondary | ICD-10-CM | POA: Diagnosis not present

## 2015-10-04 DIAGNOSIS — I10 Essential (primary) hypertension: Secondary | ICD-10-CM | POA: Diagnosis not present

## 2015-10-04 DIAGNOSIS — Z6841 Body Mass Index (BMI) 40.0 and over, adult: Secondary | ICD-10-CM | POA: Diagnosis not present

## 2015-10-04 DIAGNOSIS — Z0000 Encounter for general adult medical examination without abnormal findings: Secondary | ICD-10-CM | POA: Diagnosis not present

## 2015-10-10 DIAGNOSIS — H2513 Age-related nuclear cataract, bilateral: Secondary | ICD-10-CM | POA: Insufficient documentation

## 2015-10-10 DIAGNOSIS — H04123 Dry eye syndrome of bilateral lacrimal glands: Secondary | ICD-10-CM | POA: Insufficient documentation

## 2015-10-10 DIAGNOSIS — H47292 Other optic atrophy, left eye: Secondary | ICD-10-CM | POA: Insufficient documentation

## 2015-10-10 DIAGNOSIS — H401232 Low-tension glaucoma, bilateral, moderate stage: Secondary | ICD-10-CM | POA: Insufficient documentation

## 2015-11-05 ENCOUNTER — Telehealth: Payer: Self-pay | Admitting: Neurology

## 2015-11-05 MED ORDER — METHYLPHENIDATE HCL 20 MG PO TABS: 20.0000 mg | ORAL_TABLET | Freq: Three times a day (TID) | ORAL | Status: DC

## 2015-11-05 NOTE — Telephone Encounter (Signed)
Patient requesting refill of methylphenidate (RITALIN) 20 MG tablet Pharmacy:pick up

## 2015-11-05 NOTE — Telephone Encounter (Signed)
Ritalin rx. up front [facility]/fim 

## 2015-11-05 NOTE — Telephone Encounter (Signed)
Rx. awaiting RAS sig/fim 

## 2016-01-14 ENCOUNTER — Other Ambulatory Visit: Payer: Self-pay | Admitting: Family Medicine

## 2016-01-16 ENCOUNTER — Telehealth: Payer: Self-pay | Admitting: Neurology

## 2016-01-16 NOTE — Telephone Encounter (Signed)
Patient called to request refill of methylphenidate (RITALIN) 20 MG tablet

## 2016-01-16 NOTE — Telephone Encounter (Signed)
I have spoken with Sydney Pace this morning.  She was last seen in Dec. 2016, was to f/u with RAS 5 mos. after that.  I have explained that since Ritalin is a controlled substance, he needs to see her at least every 6 mos. in order to continue rx'ing it.  I have offered to make f/u appt. now.  She sts. she is not able to make appt. at this time due to not having any way to get to our office.  She will call back when she is able to come in/fim

## 2016-01-29 ENCOUNTER — Ambulatory Visit (INDEPENDENT_AMBULATORY_CARE_PROVIDER_SITE_OTHER): Payer: Medicare HMO | Admitting: Neurology

## 2016-01-29 ENCOUNTER — Encounter: Payer: Self-pay | Admitting: Neurology

## 2016-01-29 VITALS — BP 120/80 | HR 72 | Resp 18 | Ht 65.0 in | Wt 281.0 lb

## 2016-01-29 DIAGNOSIS — N3281 Overactive bladder: Secondary | ICD-10-CM | POA: Diagnosis not present

## 2016-01-29 DIAGNOSIS — R260 Ataxic gait: Secondary | ICD-10-CM

## 2016-01-29 DIAGNOSIS — R5382 Chronic fatigue, unspecified: Secondary | ICD-10-CM | POA: Diagnosis not present

## 2016-01-29 DIAGNOSIS — G35 Multiple sclerosis: Secondary | ICD-10-CM

## 2016-01-29 DIAGNOSIS — F329 Major depressive disorder, single episode, unspecified: Secondary | ICD-10-CM | POA: Diagnosis not present

## 2016-01-29 DIAGNOSIS — F32A Depression, unspecified: Secondary | ICD-10-CM

## 2016-01-29 DIAGNOSIS — R69 Illness, unspecified: Secondary | ICD-10-CM | POA: Diagnosis not present

## 2016-01-29 MED ORDER — MODAFINIL 200 MG PO TABS: 200.0000 mg | ORAL_TABLET | Freq: Every day | ORAL | 5 refills | Status: DC

## 2016-01-29 MED ORDER — OXYBUTYNIN CHLORIDE 5 MG PO TABS
5.0000 mg | ORAL_TABLET | Freq: Two times a day (BID) | ORAL | 11 refills | Status: DC
Start: 2016-01-29 — End: 2017-02-15

## 2016-01-29 MED ORDER — GABAPENTIN 600 MG PO TABS: ORAL_TABLET | ORAL | 5 refills | Status: DC

## 2016-01-29 MED ORDER — METHYLPHENIDATE HCL 20 MG PO TABS: 20.0000 mg | ORAL_TABLET | Freq: Three times a day (TID) | ORAL | 0 refills | Status: DC

## 2016-01-29 NOTE — Progress Notes (Signed)
u  [facility]  PATIENT: Sydney Pace DOB: 1956-05-18  REFERRING DOCTOR OR PCP:  Penni Homans  _________________________________   HISTORICAL  CHIEF COMPLAINT:  MS  HISTORY OF PRESENT ILLNESS:  Sydney Pace is a 60 year old woman with multiple sclerosis.    She feels stable but no no new neurologic symptoms.   Depression and leg pain are her main problems  MS:   She is not on any disease modifying therapy but has had stable MRI's, last one 10/27/2013.     Previously, she was on Copaxone, Avonex and Gilenya but felt worse with all of them.     Gait/strength/sensation:   Gait is stable but balance is mildly of.   She has some stumbles and rare falls.   With one fall she got a sore on her left leg that is slowly healing.    She continues to report dysesthesias in her legs like they are on fire.    The right leg tires out easily. She also gets muscle aches in both legs. Legs are stiff, especially if she is sitting or laying for long time. She takes gabapentin 300-600 mg.   She was on meloxicam.   She denies any numbness or tingling in her arms.    Bladder:  She has urinary urgency with frequency, helped by Oxybutynin.   There is mild hesitancy but no recent urinary tract infections.  Vision/vertigo:   Vision has not changed this year.  She had left ON in past and in the past  She had visual field testing showing a right superior homonymous field cut.      Vertigo has resolved.  Fatigue/sleep: She has fatigue that she is both physical and cognitive. She has received some benefit from methylphenidate 20 mg bid to tid.   Provigil helped but was very expensive.   She has some difficulty with sleep maintenance  insomnia helped by gabapentin 300 mg nightly with continued benefit and less hangover.  .   She snores. No one has ever told her that she has apnea.  Mood/cognition: Depression is mildly worse this year.   She is on sertraline and tolerates it well.   She sees Dr. Charlett Blake  but nly 1-2 times a year..   She had a major depression that worsened in 2014 and is still an issue.   She notes some mild cognitive dysfunction. Specifically there is some difficulty with short-term memory, verbal fluency and processing speed.  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  MRI was performed in 1983 and was consistent with MS.     In the late 1990's, she was started on Copaxone but stopped after several weeks due to skin reactions. She also tried Avonex but had a rash and stopped. She started Gilenya in January 2014 but stopped after 7 or 8 months due to being more fatigue and having flulike symptoms. In May 2015 we had discussed Aubagio. She decided not to start.  She had an MRI of the brain with and without contrast on 10/27/2013 and I reviewed the study. It showed white matter foci in a pattern consistent with MS. There was no enhancement. When compared to an MRI dated 11/08/2009, there was no interval change.   REVIEW OF SYSTEMS: Constitutional: No fevers, chills, sweats, or change in appetite.   Notes fatigue.   Insomnia Eyes: see above.  No double vision, eye pain..  Some eye redness Ear,  nose and throat: No hearing loss, ear pain, nasal congestion, sore throat Cardiovascular: No chest pain, palpitations Respiratory: No shortness of breath at rest or with exertion.   No wheezes GastrointestinaI: No nausea, vomiting, diarrhea, abdominal pain, fecal incontinence Genitourinary: Mild urinary frequency.  No nocturia. Musculoskeletal: No neck pain, back pain.  Some hip pain Integumentary: No rash, pruritus, skin lesions Neurological: as above Psychiatric: Mild depression at this time, rarely cries, less anxiety Endocrine: No palpitations, diaphoresis, change in appetite, change in weigh or increased thirst Hematologic/Lymphatic: No anemia, purpura, petechiae. Allergic/Immunologic: No itchy/runny eyes, nasal congestion,  recent allergic reactions, rashes  ALLERGIES: No Known Allergies  HOME MEDICATIONS:  Current Outpatient Prescriptions:  .  brimonidine (ALPHAGAN) 0.2 % ophthalmic solution, 3 (three) times daily., Disp: , Rfl:  .  calcium carbonate 200 MG capsule, Take 250 mg by mouth 2 (two) times daily with a meal., Disp: , Rfl:  .  cetirizine (ZYRTEC) 10 MG tablet, Take 10 mg by mouth daily., Disp: , Rfl:  .  Cholecalciferol (VITAMIN D PO), Take by mouth., Disp: , Rfl:  .  Docusate Calcium (STOOL SOFTENER PO), Take by mouth daily as needed., Disp: , Rfl:  .  furosemide (LASIX) 20 MG tablet, TAKE ONE TABLET BY MOUTH IN THE MORNING AS NEEDED FOR  LEG  SWELLING, Disp: 90 tablet, Rfl: 0 .  gabapentin (NEURONTIN) 600 MG tablet, Take one or two pills at night, Disp: 60 tablet, Rfl: 5 .  lisinopril-hydrochlorothiazide (PRINZIDE,ZESTORETIC) 20-25 MG tablet, TAKE ONE TABLET BY MOUTH ONCE DAILY, Disp: 90 tablet, Rfl: 1 .  methylphenidate (RITALIN) 20 MG tablet, Take 1 tablet (20 mg total) by mouth 3 (three) times daily., Disp: 90 tablet, Rfl: 0 .  Multiple Vitamin (MULTIVITAMIN) tablet, Take 1 tablet by mouth daily., Disp: , Rfl:  .  oxybutynin (DITROPAN) 5 MG tablet, Take 1 tablet (5 mg total) by mouth 2 (two) times daily., Disp: 60 tablet, Rfl: 11 .  ranitidine (ZANTAC) 300 MG tablet, Take 1 tablet (300 mg total) by mouth at bedtime as needed for heartburn., Disp: 30 tablet, Rfl: 5 .  sertraline (ZOLOFT) 100 MG tablet, Take 1 tablet (100 mg total) by mouth daily., Disp: 30 tablet, Rfl: 3 .  meloxicam (MOBIC) 15 MG tablet, Take 1 tablet (15 mg total) by mouth daily as needed for pain. With food (Patient not taking: Reported on 01/29/2016), Disp: 30 tablet, Rfl: 11 .  modafinil (PROVIGIL) 200 MG tablet, Take 1 tablet (200 mg total) by mouth daily., Disp: 30 tablet, Rfl: 5  PAST MEDICAL HISTORY: Past Medical History:  Diagnosis Date  . Arthritis   . Depression   . Esophageal reflux 08/08/2012  . GERD  (gastroesophageal reflux disease)   . Hypertension   . MS (multiple sclerosis) ([place])   . Other and unspecified hyperlipidemia 08/08/2012  . Thyroid disease     PAST SURGICAL HISTORY: Past Surgical History:  Procedure Laterality Date  . HYSTEROSCOPY  02, 04, 2008   with D&C  . TMJ ARTHROSCOPY    . TONSILLECTOMY      FAMILY HISTORY: Family History  Problem Relation Age of Onset  . Heart disease Mother     pacemaker  . Emphysema Mother   . Hypertension Mother   . Heart disease Father   . Diabetes Father   . Colon cancer Neg Hx   . Esophageal cancer Neg Hx   . Rectal cancer Neg Hx   . Stomach cancer Neg Hx   . Heart disease Sister  cad  . Sleep apnea Brother     SOCIAL HISTORY:  Social History   Social History  . Marital status: Single    Spouse name: N/A  . Number of children: N/A  . Years of education: N/A   Occupational History  . Not on file.   Social History Main Topics  . Smoking status: Never Smoker  . Smokeless tobacco: Never Used  . Alcohol use 0.0 oz/week     Comment: occassional  . Drug use: No  . Sexual activity: Not Currently     Comment: 1st intercourse 13 yo-5 partners   Other Topics Concern  . Not on file   Social History Narrative  . No narrative on file     PHYSICAL EXAM  Vitals:   01/29/16 0925  BP: 120/80  Pulse: 72  Resp: 18  Weight: 281 lb (127.5 kg)  Height: 5\' 5"  (1.651 m)    Body mass index is 46.76 kg/m.   General: The patient is well-developed and well-nourished and in no acute distress  Neurologic Exam  Mental status: The patient is alert and oriented x 3 at the time of the examination. The patient has apparent normal recent and remote memory, with an apparently normal attention span and concentration ability.   Speech is normal.  Cranial nerves: Extraocular movements are full.   Colors are desaturated OS.  There is good facial sensation to soft touch bilaterally.Facial strength is normal.  Trapezius and  sternocleidomastoid strength is normal. No dysarthria is noted.    No obvious hearing deficits are noted.  Motor:  Muscle bulk is normal.   Tone is mildly increased in legs, R> L. Strength is  5 / 5 in all 4 extremities.   Sensory: Sensory testing is intact to pinprick, soft touch and vibration sensation in all 4 extremities.  Coordination: Cerebellar testing reveals good finger-nose-finger bilaterally.  Gait and station: Station is normal.   Gait is normal. Tandem gait is moderately wide. Romberg is negative.   Reflexes: Deep tendon reflexes are symmetric and normal bilaterally.        DIAGNOSTIC DATA (LABS, IMAGING, TESTING) - I reviewed patient records, labs, notes, testing and imaging myself where available.  Lab Results  Component Value Date   WBC 7.8 06/17/2015   HGB 13.4 06/17/2015   HCT 40.5 06/17/2015   MCV 87.7 06/17/2015   PLT 300.0 06/17/2015      Component Value Date/Time   NA 139 06/17/2015 0859   K 4.2 06/17/2015 0859   CL 103 06/17/2015 0859   CO2 26 06/17/2015 0859   GLUCOSE 102 (H) 06/17/2015 0859   BUN 14 06/17/2015 0859   CREATININE 0.98 06/17/2015 0859   CREATININE 0.96 08/22/2013 1047   CALCIUM 10.2 06/17/2015 0859   PROT 8.1 06/17/2015 0859   ALBUMIN 3.9 06/17/2015 0859   AST 19 06/17/2015 0859   ALT 15 06/17/2015 0859   ALKPHOS 97 06/17/2015 0859   BILITOT 0.4 06/17/2015 0859   Lab Results  Component Value Date   CHOL 166 12/10/2014   HDL 48.80 12/10/2014   LDLCALC 109 (H) 12/10/2014   TRIG 40.0 12/10/2014   CHOLHDL 3 12/10/2014   Lab Results  Component Value Date   HGBA1C 5.8 01/02/2009   No results found for: [id number] Lab Results  Component Value Date   TSH 0.58 12/10/2014       ASSESSMENT AND PLAN  Multiple sclerosis (HCC)  Chronic fatigue  Depression  Overactive bladder  Ataxic gait  1.   Continue Ritalin.   Add provigil for fatigue and sleepiness 2.   Increase gabapentin to 300-300-600 if tolerated.   Take  meloxicam for pain prn   3    Continue zoloft for depression.     4.   Exercise as tolerated 5.   Oxybutynin for bladder 6.   She will return to see me in 6 months or sooner if she has new or worsening neurologic symptoms.   Porshe Fleagle A. Felecia Shelling, MD, PhD [id number], [id number] AM Certified in Neurology, Clinical Neurophysiology, Sleep Medicine, Pain Medicine and Neuroimaging  [facility] [address] [place], [place] [address] [phone]

## 2016-02-12 ENCOUNTER — Other Ambulatory Visit: Payer: Self-pay | Admitting: Family Medicine

## 2016-02-13 ENCOUNTER — Telehealth: Payer: Self-pay | Admitting: Family Medicine

## 2016-02-13 ENCOUNTER — Other Ambulatory Visit (INDEPENDENT_AMBULATORY_CARE_PROVIDER_SITE_OTHER): Payer: Medicare HMO

## 2016-02-13 ENCOUNTER — Other Ambulatory Visit: Payer: Self-pay | Admitting: Family Medicine

## 2016-02-13 DIAGNOSIS — K625 Hemorrhage of anus and rectum: Secondary | ICD-10-CM

## 2016-02-13 LAB — FECAL OCCULT BLOOD, IMMUNOCHEMICAL: Fecal Occult Bld: NEGATIVE

## 2016-02-13 NOTE — Telephone Encounter (Signed)
IFOB entered. 

## 2016-02-13 NOTE — Telephone Encounter (Signed)
Caller name: Relationship to patient: [facility] Can be reached: [phone] Pharmacy:  Reason for call: [facility] needs an IFOB order for patient

## 2016-02-17 NOTE — Telephone Encounter (Signed)
Provigil PA completed via phone with [employer]. Approved for dx. of ADHD, Depression, and Fatigue related to MS. Pt. has tried and failed Methylphenidate 20mg  tid.  Effective dates of approval are 02-17-16 thru 05-31-16.  Notice of approval faxed to [facility] fax# [phone]

## 2016-02-19 ENCOUNTER — Telehealth: Payer: Self-pay | Admitting: Neurology

## 2016-02-19 NOTE — Telephone Encounter (Signed)
Pt wants to know if she can take provigil and ritalin at the same time. Please call and advise [phone]

## 2016-02-19 NOTE — Telephone Encounter (Signed)
I have spoken with Caress this afternoon, and per RAS last ov note, advised she may continue Ritalin and take Provigil as well.  She verbalized understanding of same/fim

## 2016-04-22 ENCOUNTER — Encounter: Payer: Self-pay | Admitting: Medical

## 2016-04-22 ENCOUNTER — Ambulatory Visit (INDEPENDENT_AMBULATORY_CARE_PROVIDER_SITE_OTHER): Payer: Medicare HMO | Admitting: Medical

## 2016-04-22 VITALS — BP 115/70 | HR 87 | Temp 98.1°F | Ht 65.0 in | Wt 282.0 lb

## 2016-04-22 DIAGNOSIS — R739 Hyperglycemia, unspecified: Secondary | ICD-10-CM | POA: Diagnosis not present

## 2016-04-22 DIAGNOSIS — M2550 Pain in unspecified joint: Secondary | ICD-10-CM | POA: Diagnosis not present

## 2016-04-22 DIAGNOSIS — R5383 Other fatigue: Secondary | ICD-10-CM

## 2016-04-22 DIAGNOSIS — G35 Multiple sclerosis: Secondary | ICD-10-CM

## 2016-04-22 LAB — COMPREHENSIVE METABOLIC PANEL
ALT: 11 U/L (ref 6–29)
AST: 20 U/L (ref 10–35)
Albumin: 3.8 g/dL (ref 3.6–5.1)
Alkaline Phosphatase: 99 U/L (ref 33–130)
BUN: 17 mg/dL (ref 7–25)
CHLORIDE: 104 mmol/L (ref 98–110)
CO2: 27 mmol/L (ref 20–31)
CREATININE: 0.91 mg/dL (ref 0.50–0.99)
Calcium: 9.9 mg/dL (ref 8.6–10.4)
GLUCOSE: 86 mg/dL (ref 65–99)
Potassium: 4.2 mmol/L (ref 3.5–5.3)
SODIUM: 140 mmol/L (ref 135–146)
TOTAL PROTEIN: 7.8 g/dL (ref 6.1–8.1)
Total Bilirubin: 0.5 mg/dL (ref 0.2–1.2)

## 2016-04-22 LAB — CBC WITH DIFFERENTIAL/PLATELET
Basophils Absolute: 0 cells/uL (ref 0–200)
Basophils Relative: 0 %
EOS PCT: 2 %
Eosinophils Absolute: 162 cells/uL (ref 15–500)
HCT: 38.4 % (ref 35.0–45.0)
HEMOGLOBIN: 12.8 g/dL (ref 11.7–15.5)
LYMPHS ABS: 2754 {cells}/uL (ref 850–3900)
Lymphocytes Relative: 34 %
MCH: 29.8 pg (ref 27.0–33.0)
MCHC: 33.3 g/dL (ref 32.0–36.0)
MCV: 89.5 fL (ref 80.0–100.0)
MPV: 8.8 fL (ref 7.5–12.5)
Monocytes Absolute: 486 cells/uL (ref 200–950)
Monocytes Relative: 6 %
NEUTROS ABS: 4698 {cells}/uL (ref 1500–7800)
NEUTROS PCT: 58 %
PLATELETS: 242 10*3/uL (ref 140–400)
RBC: 4.29 MIL/uL (ref 3.80–5.10)
RDW: 14.6 % (ref 11.0–15.0)
WBC: 8.1 10*3/uL (ref 3.8–10.8)

## 2016-04-22 LAB — POC URINALSYSI DIPSTICK (AUTOMATED)
Bilirubin, UA: NEGATIVE
Blood, UA: NEGATIVE
Glucose, UA: NEGATIVE
Ketones, UA: NEGATIVE
LEUKOCYTES UA: NEGATIVE
Nitrite, UA: NEGATIVE
SPEC GRAV UA: 1.015
UROBILINOGEN UA: 0.2
pH, UA: 7

## 2016-04-22 LAB — TSH: TSH: 0.55 m[IU]/L

## 2016-04-22 LAB — SEDIMENTATION RATE: SED RATE: 40 mm/h — AB (ref 0–30)

## 2016-04-22 MED ORDER — MELOXICAM 15 MG PO TABS: 15.0000 mg | ORAL_TABLET | Freq: Every day | ORAL | 0 refills | Status: DC | PRN

## 2016-04-22 MED ORDER — KETOROLAC TROMETHAMINE 60 MG/2ML IM SOLN
60.0000 mg | Freq: Once | INTRAMUSCULAR | Status: AC
  Administered 2016-04-22: 60 mg via INTRAMUSCULAR

## 2016-04-22 NOTE — Progress Notes (Signed)
Pre visit review using our clinic review tool, if applicable. No additional management support is needed unless otherwise documented below in the visit note. 

## 2016-04-22 NOTE — Progress Notes (Signed)
Subjective:    Patient ID: Sydney Pace, female    DOB: Feb 21, 1956, 60 y.o.   MRN: [id number]  HPI   Pt in achiness to body for 2 weeks. She states lower back, hips and knees. Pt states stiff feeling in the morning. Mostly in her lower ext.  Pt also states she has been fatigued on and off for 2 wks.   Pt has hx of MS. She sees Dr. Felecia Shelling in about 3 months. No vision changes. No obvious incontinence recently. States in recent past doing well with her MS.  Pt states was fatigued in august. Worse fatigue recentl compared to then.  Pt has minimal elevated sugar levels in the past. No polyphagia or polydypsia.  Review of Systems  Constitutional: Positive for fatigue. Negative for chills and fever.  HENT: Negative for congestion.   Respiratory: Negative for cough, choking, shortness of breath and wheezing.   Gastrointestinal: Negative for abdominal pain.  Genitourinary: Negative for difficulty urinating, flank pain, frequency, urgency and vaginal pain.  Musculoskeletal: Positive for arthralgias, back pain and myalgias.       Hips, knees and back pain.  Neurological: Positive for dizziness. Negative for syncope, facial asymmetry, speech difficulty, weakness, light-headedness, numbness and headaches.       Very low level ha at times over past 2 weeks. Not presently.  Rare dizziness on and off intermitent. Not presently.  Hematological: Negative for adenopathy. Does not bruise/bleed easily.  Psychiatric/Behavioral: Negative for agitation, behavioral problems and confusion.    Past Medical History:  Diagnosis Date  . Arthritis   . Depression   . Esophageal reflux 08/08/2012  . GERD (gastroesophageal reflux disease)   . Hypertension   . MS (multiple sclerosis) ([place])   . Other and unspecified hyperlipidemia 08/08/2012  . Thyroid disease      Social History   Social History  . Marital status: Single    Spouse name: N/A  . Number of children: N/A  . Years of education: N/A    Occupational History  . Not on file.   Social History Main Topics  . Smoking status: Never Smoker  . Smokeless tobacco: Never Used  . Alcohol use 0.0 oz/week     Comment: occassional  . Drug use: No  . Sexual activity: Not Currently     Comment: 1st intercourse 13 yo-5 partners   Other Topics Concern  . Not on file   Social History Narrative  . No narrative on file    Past Surgical History:  Procedure Laterality Date  . HYSTEROSCOPY  02, 04, 2008   with D&C  . TMJ ARTHROSCOPY    . TONSILLECTOMY      Family History  Problem Relation Age of Onset  . Heart disease Mother     pacemaker  . Emphysema Mother   . Hypertension Mother   . Heart disease Father   . Diabetes Father   . Colon cancer Neg Hx   . Esophageal cancer Neg Hx   . Rectal cancer Neg Hx   . Stomach cancer Neg Hx   . Heart disease Sister     cad  . Sleep apnea Brother     No Known Allergies  Current Outpatient Prescriptions on File Prior to Visit  Medication Sig Dispense Refill  . brimonidine (ALPHAGAN) 0.2 % ophthalmic solution 3 (three) times daily.    . calcium carbonate 200 MG capsule Take 250 mg by mouth 2 (two) times daily with a meal.    . cetirizine (  ZYRTEC) 10 MG tablet Take 10 mg by mouth daily.    . Cholecalciferol (VITAMIN D PO) Take by mouth.    Mariane Baumgarten Calcium (STOOL SOFTENER PO) Take by mouth daily as needed.    . furosemide (LASIX) 20 MG tablet TAKE ONE TABLET BY MOUTH IN THE MORNING AS NEEDED FOR  LEG  SWELLING 90 tablet 0  . gabapentin (NEURONTIN) 600 MG tablet Take one or two pills at night 60 tablet 5  . lisinopril-hydrochlorothiazide (PRINZIDE,ZESTORETIC) 20-25 MG tablet TAKE ONE TABLET BY MOUTH ONCE DAILY 90 tablet 1  . methylphenidate (RITALIN) 20 MG tablet Take 1 tablet (20 mg total) by mouth 3 (three) times daily. 90 tablet 0  . modafinil (PROVIGIL) 200 MG tablet Take 1 tablet (200 mg total) by mouth daily. 30 tablet 5  . Multiple Vitamin (MULTIVITAMIN) tablet Take 1  tablet by mouth daily.    Marland Kitchen oxybutynin (DITROPAN) 5 MG tablet Take 1 tablet (5 mg total) by mouth 2 (two) times daily. 60 tablet 11  . ranitidine (ZANTAC) 300 MG tablet Take 1 tablet (300 mg total) by mouth at bedtime as needed for heartburn. 30 tablet 5  . sertraline (ZOLOFT) 100 MG tablet TAKE ONE TABLET BY MOUTH ONCE DAILY 30 tablet 3   No current facility-administered medications on file prior to visit.     BP 115/70   Pulse 87   Temp 98.1 F (36.7 C) (Oral)   Ht 5\' 5"  (1.651 m)   Wt 282 lb (127.9 kg)   SpO2 98%   BMI 46.93 kg/m       Objective:   Physical Exam   General Mental Status- Alert. General Appearance- Not in acute distress.   Skin General: Color- Normal Color. Moisture- Normal Moisture.  Neck Carotid Arteries- Normal color. Moisture- Normal Moisture. No carotid bruits. No JVD.  Chest and Lung Exam Auscultation: Breath Sounds:-Normal.  Cardiovascular Auscultation:Rythm- Regular. Murmurs & Other Heart Sounds:Auscultation of the heart reveals- No Murmurs.  Abdomen Inspection:-Inspeection Normal. Palpation/Percussion:Note:No mass. Palpation and Percussion of the abdomen reveal- Non Tender, Non Distended + BS, no rebound or guarding.    Neurologic Cranial Nerve exam:- CN III-XII intact(No nystagmus), symmetric smile. Strength:- 5/5 equal and symmetric strength both upper and lower extremities.  Back- no cva pain. Faint mid lspine tender. Hips- on palpation mild hip pain bilaterall. Knees- obvious crepitus on flexion and extension of knees. Elbows- no pain on rom of elbows.     Assessment & Plan:  For fatigue will get cmp, tsh, and cbc. Labs to include ua dip and culture.  For arthrlagia. RA panel.  For muscle or joint pain will toradol 60 mg im. Will refill mobic for joint pain.   We are sending out lab but they won't be sent out until Monday. So if you have worsening or changing signs or symptoms then ED evaluation. Extensive time unitl  Monday so we need to watch you closely. So please call me tomorrow on my cell and give me update.  Will watch closely if on follow up reports ha or dizziness consider imaging of head. Today did not think indicated since no dizziness or ha on exam  Note if work up all negative and fatigue persist then refer her back to her neurologist sooner than scheduled.  Follow up on Monday or as needed  Walton Digilio, [place], [place]

## 2016-04-22 NOTE — Addendum Note (Signed)
Addended by: Tasia Catchings on: 04/22/2016 07:36 PM   Modules accepted: Orders

## 2016-04-22 NOTE — Patient Instructions (Addendum)
For fatigue will get cmp, tsh, and cbc. Labs to include ua dip and culture.  For arthrlagia. RA panel.  For muscle or joint pain will toradol 60 mg im. Will refill mobic for joint pain.   We are sending out lab but they won't be sent out until Monday. So if you have worsening or changing signs or symptoms then ED evaluation. Extensive time until Monday so we need to watch you closely. So please call me tomorrow on my cell and give me update.  Follow up on Monday or as needed  If you worsen over the long holidy break then be seen in ED.

## 2016-04-23 LAB — RHEUMATOID FACTOR: Rhuematoid fact SerPl-aCnc: <14 IU/mL (ref <14)

## 2016-04-24 LAB — ANA: Anti Nuclear Antibody(ANA): NEGATIVE

## 2016-04-27 ENCOUNTER — Telehealth: Payer: Self-pay | Admitting: Medical

## 2016-04-27 NOTE — Telephone Encounter (Signed)
If pt has persisting fatigue or any uti symptoms then would recommend getting the ua dip and culture. If her she is not longer fatigued and no uti symptoms  then ua and culture not necessary.

## 2016-04-28 ENCOUNTER — Telehealth: Payer: Self-pay | Admitting: Medical

## 2016-04-28 NOTE — Telephone Encounter (Signed)
I am canceling the ifob test since looks like she never returned the study. But if she does please reactivate the original order.

## 2016-04-28 NOTE — Telephone Encounter (Signed)
Santiago Glad called and spoke with the patient about her results and she voices understanding.

## 2016-05-02 ENCOUNTER — Other Ambulatory Visit: Payer: Self-pay | Admitting: Family Medicine

## 2016-05-11 ENCOUNTER — Other Ambulatory Visit: Payer: Self-pay | Admitting: Family Medicine

## 2016-05-11 DIAGNOSIS — Z1231 Encounter for screening mammogram for malignant neoplasm of breast: Secondary | ICD-10-CM

## 2016-06-04 ENCOUNTER — Ambulatory Visit: Payer: Medicare HMO

## 2016-06-09 ENCOUNTER — Encounter: Payer: Self-pay | Admitting: Family Medicine

## 2016-06-09 ENCOUNTER — Ambulatory Visit (INDEPENDENT_AMBULATORY_CARE_PROVIDER_SITE_OTHER): Payer: Medicare HMO | Admitting: Family Medicine

## 2016-06-09 DIAGNOSIS — I10 Essential (primary) hypertension: Secondary | ICD-10-CM

## 2016-06-09 DIAGNOSIS — J329 Chronic sinusitis, unspecified: Secondary | ICD-10-CM

## 2016-06-09 DIAGNOSIS — E860 Dehydration: Secondary | ICD-10-CM | POA: Diagnosis not present

## 2016-06-09 DIAGNOSIS — G35 Multiple sclerosis: Secondary | ICD-10-CM | POA: Diagnosis not present

## 2016-06-09 HISTORY — DX: Dehydration: E86.0

## 2016-06-09 MED ORDER — CEFDINIR 300 MG PO CAPS: 300.0000 mg | ORAL_CAPSULE | Freq: Two times a day (BID) | ORAL | 0 refills | Status: DC

## 2016-06-09 NOTE — Patient Instructions (Addendum)
Encouraged increased rest and hydration, add probiotics, zinc such as Coldeze or Xicam. Treat fevers as needed. Elderberry, aged or black garlic. Probiotics such as the Stebbins. At [place] at [email or web address] Sinusitis, Adult Sinusitis is soreness and inflammation of your sinuses. Sinuses are hollow spaces in the bones around your face. They are located:  Around your eyes.  In the middle of your forehead.  Behind your nose.  In your cheekbones. Your sinuses and nasal passages are lined with a stringy fluid (mucus). Mucus normally drains out of your sinuses. When your nasal tissues get inflamed or swollen, the mucus can get trapped or blocked so air cannot flow through your sinuses. This lets bacteria, viruses, and funguses grow, and that leads to infection. Follow these instructions at home: Medicines  Take, use, or apply over-the-counter and prescription medicines only as told by your doctor. These may include nasal sprays.  If you were prescribed an antibiotic medicine, take it as told by your doctor. Do not stop taking the antibiotic even if you start to feel better. Hydrate and Humidify  Drink enough water to keep your pee (urine) clear or pale yellow.  Use a cool mist humidifier to keep the humidity level in your home above 50%.  Breathe in steam for 10-15 minutes, 3-4 times a day or as told by your doctor. You can do this in the bathroom while a hot shower is running.  Try not to spend time in cool or dry air. Rest  Rest as much as possible.  Sleep with your head raised (elevated).  Make sure to get enough sleep each night. General instructions  Put a warm, moist washcloth on your face 3-4 times a day or as told by your doctor. This will help with discomfort.  Wash your hands often with soap and water. If there is no soap and water, use hand sanitizer.  Do not smoke. Avoid being around people who are smoking (secondhand smoke).  Keep  all follow-up visits as told by your doctor. This is important. Contact a doctor if:  You have a fever.  Your symptoms get worse.  Your symptoms do not get better within 10 days. Get help right away if:  You have a very bad headache.  You cannot stop throwing up (vomiting).  You have pain or swelling around your face or eyes.  You have trouble seeing.  You feel confused.  Your neck is stiff.  You have trouble breathing. This information is not intended to replace advice given to you by your health care provider. Make sure you discuss any questions you have with your health care provider. Document Released: 11/04/2007 Document Revised: 01/12/2016 Document Reviewed: 03/13/2015 Elsevier Interactive Patient Education  2017 [employer].

## 2016-06-09 NOTE — Progress Notes (Signed)
Pre visit review using our clinic review tool, if applicable. No additional management support is needed unless otherwise documented below in the visit note. 

## 2016-06-09 NOTE — Assessment & Plan Note (Signed)
Clinically dry today. Encouraged to increase her fluid intake to at least 64 oz until her oral cavity is hydrated and wet and shiny once more.

## 2016-06-09 NOTE — Progress Notes (Signed)
Patient ID: Sydney Pace, female   DOB: 1955-10-07, 61 y.o.   MRN: [id number]

## 2016-06-09 NOTE — Assessment & Plan Note (Signed)
Well controlled, no changes to meds. Encouraged heart healthy diet such as the DASH diet and exercise as tolerated.  °

## 2016-06-09 NOTE — Assessment & Plan Note (Signed)
Had a recent scare with a flare in pain, sed rate was elevated but no other labs were elevated recently. Feeling better now and declines referral for further consideration

## 2016-06-09 NOTE — Progress Notes (Signed)
Subjective:    Patient ID: Sydney Pace, female    DOB: Aug 15, 1955, 61 y.o.   MRN: [id number]  No chief complaint on file.   HPI Patient is in today for a possible sinus infection. Patient has been experiencing sinus pain and pressure for the past 3 weeks. She has tried plain Mucinex with minimal results. She struggles with chronic fatigue and myalgias. Denies any fevers, chills, chest pain, SOB but does endorse some occasional wheezing. No GI concerns or recent hospitalizations. Her pain flare from November has improved.   Past Medical History:  Diagnosis Date  . Arthritis   . Dehydration 06/09/2016  . Depression   . Esophageal reflux 08/08/2012  . GERD (gastroesophageal reflux disease)   . Hypertension   . MS (multiple sclerosis) ([place])   . Other and unspecified hyperlipidemia 08/08/2012  . Thyroid disease     Past Surgical History:  Procedure Laterality Date  . HYSTEROSCOPY  02, 04, 2008   with D&C  . TMJ ARTHROSCOPY    . TONSILLECTOMY      Family History  Problem Relation Age of Onset  . Heart disease Mother     pacemaker  . Emphysema Mother   . Hypertension Mother   . Heart disease Father   . Diabetes Father   . Colon cancer Neg Hx   . Esophageal cancer Neg Hx   . Rectal cancer Neg Hx   . Stomach cancer Neg Hx   . Heart disease Sister     cad  . Sleep apnea Brother     Social History   Social History  . Marital status: Single    Spouse name: N/A  . Number of children: N/A  . Years of education: N/A   Occupational History  . Not on file.   Social History Main Topics  . Smoking status: Never Smoker  . Smokeless tobacco: Never Used  . Alcohol use 0.0 oz/week     Comment: occassional  . Drug use: No  . Sexual activity: Not Currently     Comment: 1st intercourse 13 yo-5 partners   Other Topics Concern  . Not on file   Social History Narrative  . No narrative on file    Outpatient Medications Prior to Visit  Medication Sig Dispense Refill  .  brimonidine (ALPHAGAN) 0.2 % ophthalmic solution 3 (three) times daily.    . calcium carbonate 200 MG capsule Take 250 mg by mouth 2 (two) times daily with a meal.    . cetirizine (ZYRTEC) 10 MG tablet Take 10 mg by mouth daily.    . Cholecalciferol (VITAMIN D PO) Take by mouth.    Mariane Baumgarten Calcium (STOOL SOFTENER PO) Take by mouth daily as needed.    . furosemide (LASIX) 20 MG tablet TAKE ONE TABLET BY MOUTH IN THE MORNING AS NEEDED FOR  LEG  SWELLING 90 tablet 0  . gabapentin (NEURONTIN) 600 MG tablet Take one or two pills at night 60 tablet 5  . lisinopril-hydrochlorothiazide (PRINZIDE,ZESTORETIC) 20-25 MG tablet TAKE ONE TABLET BY MOUTH ONCE DAILY 90 tablet 1  . meloxicam (MOBIC) 15 MG tablet Take 1 tablet (15 mg total) by mouth daily as needed for pain. With food 14 tablet 0  . methylphenidate (RITALIN) 20 MG tablet Take 1 tablet (20 mg total) by mouth 3 (three) times daily. 90 tablet 0  . modafinil (PROVIGIL) 200 MG tablet Take 1 tablet (200 mg total) by mouth daily. 30 tablet 5  . Multiple Vitamin (MULTIVITAMIN) tablet  Take 1 tablet by mouth daily.    Marland Kitchen oxybutynin (DITROPAN) 5 MG tablet Take 1 tablet (5 mg total) by mouth 2 (two) times daily. 60 tablet 11  . ranitidine (ZANTAC) 300 MG tablet Take 1 tablet (300 mg total) by mouth at bedtime as needed for heartburn. 30 tablet 5  . ranitidine (ZANTAC) 300 MG tablet TAKE ONE TABLET BY MOUTH ONCE DAILY AT BEDTIME 30 tablet 2  . sertraline (ZOLOFT) 100 MG tablet TAKE ONE TABLET BY MOUTH ONCE DAILY 30 tablet 3   No facility-administered medications prior to visit.     No Known Allergies  Review of Systems  Constitutional: Positive for chills and malaise/fatigue. Negative for fever.       Some chills, not a lot.  HENT: Positive for congestion and sinus pain. Negative for sore throat.        Sinus pressure  Eyes: Negative for blurred vision.  Respiratory: Positive for wheezing. Negative for cough, sputum production and shortness of  breath.   Cardiovascular: Negative for chest pain, palpitations and leg swelling.  Gastrointestinal: Negative for vomiting.  Musculoskeletal: Positive for myalgias. Negative for back pain.  Skin: Negative for rash.  Neurological: Positive for headaches. Negative for loss of consciousness.       Objective:    Physical Exam  Constitutional: She is oriented to person, place, and time. She appears well-developed and well-nourished. No distress.  HENT:  Head: Normocephalic and atraumatic.  Nasal mucosa boggy and erythematous. Dry mucus membranes  Eyes: Conjunctivae are normal.  Neck: Normal range of motion. Neck supple. No thyromegaly present.  Cardiovascular: Normal rate and regular rhythm.   Pulmonary/Chest: Effort normal and breath sounds normal. She has no wheezes.  Abdominal: Soft. Bowel sounds are normal. There is no tenderness.  Musculoskeletal: She exhibits no edema or deformity.  Lymphadenopathy:    She has cervical adenopathy.  Neurological: She is alert and oriented to person, place, and time.  Skin: Skin is warm and dry. She is not diaphoretic.  Psychiatric: She has a normal mood and affect.    BP 126/80 (BP Location: Left Arm, Patient Position: Sitting, Cuff Size: Large)   Pulse 90   Temp 98.2 F (36.8 C) (Oral)   Ht 5\' 5"  (1.651 m)   Wt 288 lb 9.6 oz (130.9 kg)   SpO2 98% Comment: RA  BMI 48.03 kg/m  Wt Readings from Last 3 Encounters:  06/09/16 288 lb 9.6 oz (130.9 kg)  04/22/16 282 lb (127.9 kg)  01/29/16 281 lb (127.5 kg)     Lab Results  Component Value Date   WBC 8.1 04/22/2016   HGB 12.8 04/22/2016   HCT 38.4 04/22/2016   PLT 242 04/22/2016   GLUCOSE 86 04/22/2016   CHOL 166 12/10/2014   TRIG 40.0 12/10/2014   HDL 48.80 12/10/2014   LDLCALC 109 (H) 12/10/2014   ALT 11 04/22/2016   AST 20 04/22/2016   NA 140 04/22/2016   K 4.2 04/22/2016   CL 104 04/22/2016   CREATININE 0.91 04/22/2016   BUN 17 04/22/2016   CO2 27 04/22/2016   TSH 0.55  04/22/2016   HGBA1C 5.8 01/02/2009   MICROALBUR 1.17 01/02/2009    Lab Results  Component Value Date   TSH 0.55 04/22/2016   Lab Results  Component Value Date   WBC 8.1 04/22/2016   HGB 12.8 04/22/2016   HCT 38.4 04/22/2016   MCV 89.5 04/22/2016   PLT 242 04/22/2016   Lab Results  Component Value Date  NA 140 04/22/2016   K 4.2 04/22/2016   CO2 27 04/22/2016   GLUCOSE 86 04/22/2016   BUN 17 04/22/2016   CREATININE 0.91 04/22/2016   BILITOT 0.5 04/22/2016   ALKPHOS 99 04/22/2016   AST 20 04/22/2016   ALT 11 04/22/2016   PROT 7.8 04/22/2016   ALBUMIN 3.8 04/22/2016   CALCIUM 9.9 04/22/2016   GFR 74.34 06/17/2015   Lab Results  Component Value Date   CHOL 166 12/10/2014   Lab Results  Component Value Date   HDL 48.80 12/10/2014   Lab Results  Component Value Date   LDLCALC 109 (H) 12/10/2014   Lab Results  Component Value Date   TRIG 40.0 12/10/2014   Lab Results  Component Value Date   CHOLHDL 3 12/10/2014   Lab Results  Component Value Date   HGBA1C 5.8 01/02/2009      I acted as a Education administrator for Dr. Charlett Blake. Raiford Noble, RMA  Assessment & Plan:   Problem List Items Addressed This Visit    Multiple sclerosis ([place])    Had a recent scare with a flare in pain, sed rate was elevated but no other labs were elevated recently. Feeling better now and declines referral for further consideration      Hypertension    Well controlled, no changes to meds. Encouraged heart healthy diet such as the DASH diet and exercise as tolerated.       Sinusitis    Encouraged increased rest and hydration, add probiotics, zinc such as Coldeze or Xicam. Treat fevers as needed. Started on Mucinex, probiotics and rx of Cefdinir      Relevant Medications   cefdinir (OMNICEF) 300 MG capsule   Dehydration    Clinically dry today. Encouraged to increase her fluid intake to at least 64 oz until her oral cavity is hydrated and wet and shiny once more.          I am having Ms.  Ayon start on cefdinir. I am also having her maintain her Cholecalciferol (VITAMIN D PO), multivitamin, Docusate Calcium (STOOL SOFTENER PO), cetirizine, calcium carbonate, furosemide, brimonidine, ranitidine, lisinopril-hydrochlorothiazide, modafinil, methylphenidate, gabapentin, oxybutynin, sertraline, meloxicam, and ranitidine.  Meds ordered this encounter  Medications  . cefdinir (OMNICEF) 300 MG capsule    Sig: Take 1 capsule (300 mg total) by mouth 2 (two) times daily.    Dispense:  20 capsule    Refill:  0    CMA served as scribe during this visit. History, Physical and Plan performed by medical provider. Documentation and orders reviewed and attested to.  Penni Homans, MD

## 2016-06-09 NOTE — Assessment & Plan Note (Signed)
Encouraged increased rest and hydration, add probiotics, zinc such as Coldeze or Xicam. Treat fevers as needed. Started on Mucinex, probiotics and rx of Cefdinir

## 2016-06-26 DIAGNOSIS — R69 Illness, unspecified: Secondary | ICD-10-CM | POA: Diagnosis not present

## 2016-06-26 DIAGNOSIS — M170 Bilateral primary osteoarthritis of knee: Secondary | ICD-10-CM | POA: Diagnosis not present

## 2016-06-26 DIAGNOSIS — G4701 Insomnia due to medical condition: Secondary | ICD-10-CM | POA: Diagnosis not present

## 2016-06-26 DIAGNOSIS — J3089 Other allergic rhinitis: Secondary | ICD-10-CM | POA: Diagnosis not present

## 2016-06-26 DIAGNOSIS — Z9181 History of falling: Secondary | ICD-10-CM | POA: Diagnosis not present

## 2016-06-26 DIAGNOSIS — I10 Essential (primary) hypertension: Secondary | ICD-10-CM | POA: Diagnosis not present

## 2016-06-26 DIAGNOSIS — Z0000 Encounter for general adult medical examination without abnormal findings: Secondary | ICD-10-CM | POA: Diagnosis not present

## 2016-06-26 DIAGNOSIS — H409 Unspecified glaucoma: Secondary | ICD-10-CM | POA: Diagnosis not present

## 2016-06-26 DIAGNOSIS — G35 Multiple sclerosis: Secondary | ICD-10-CM | POA: Diagnosis not present

## 2016-06-26 DIAGNOSIS — Z972 Presence of dental prosthetic device (complete) (partial): Secondary | ICD-10-CM | POA: Diagnosis not present

## 2016-06-26 DIAGNOSIS — N3281 Overactive bladder: Secondary | ICD-10-CM | POA: Diagnosis not present

## 2016-07-08 ENCOUNTER — Telehealth: Payer: Self-pay | Admitting: Medical

## 2016-07-08 ENCOUNTER — Ambulatory Visit (INDEPENDENT_AMBULATORY_CARE_PROVIDER_SITE_OTHER): Payer: Medicare HMO | Admitting: Medical

## 2016-07-08 ENCOUNTER — Encounter: Payer: Self-pay | Admitting: Medical

## 2016-07-08 ENCOUNTER — Ambulatory Visit (HOSPITAL_BASED_OUTPATIENT_CLINIC_OR_DEPARTMENT_OTHER)
Admission: RE | Admit: 2016-07-08 | Discharge: 2016-07-08 | Disposition: A | Discharge status: Home / Self Care | Payer: Medicare HMO | Source: Ambulatory Visit | Attending: Medical | Admitting: Medical

## 2016-07-08 ENCOUNTER — Encounter: Payer: Self-pay | Admitting: *Deleted

## 2016-07-08 VITALS — BP 150/88 | HR 68 | Temp 97.8°F | Resp 16 | Ht 65.0 in | Wt 284.0 lb

## 2016-07-08 DIAGNOSIS — M7989 Other specified soft tissue disorders: Secondary | ICD-10-CM | POA: Insufficient documentation

## 2016-07-08 DIAGNOSIS — M25511 Pain in right shoulder: Secondary | ICD-10-CM

## 2016-07-08 DIAGNOSIS — M7581 Other shoulder lesions, right shoulder: Secondary | ICD-10-CM | POA: Diagnosis not present

## 2016-07-08 DIAGNOSIS — M898X6 Other specified disorders of bone, lower leg: Secondary | ICD-10-CM

## 2016-07-08 DIAGNOSIS — M2011 Hallux valgus (acquired), right foot: Secondary | ICD-10-CM | POA: Diagnosis not present

## 2016-07-08 DIAGNOSIS — M25571 Pain in right ankle and joints of right foot: Secondary | ICD-10-CM | POA: Insufficient documentation

## 2016-07-08 DIAGNOSIS — W19XXXA Unspecified fall, initial encounter: Secondary | ICD-10-CM | POA: Insufficient documentation

## 2016-07-08 DIAGNOSIS — M79671 Pain in right foot: Secondary | ICD-10-CM

## 2016-07-08 DIAGNOSIS — M5136 Other intervertebral disc degeneration, lumbar region: Secondary | ICD-10-CM | POA: Diagnosis not present

## 2016-07-08 DIAGNOSIS — M79661 Pain in right lower leg: Secondary | ICD-10-CM

## 2016-07-08 DIAGNOSIS — R600 Localized edema: Secondary | ICD-10-CM | POA: Diagnosis not present

## 2016-07-08 DIAGNOSIS — M533 Sacrococcygeal disorders, not elsewhere classified: Secondary | ICD-10-CM

## 2016-07-08 DIAGNOSIS — M4316 Spondylolisthesis, lumbar region: Secondary | ICD-10-CM | POA: Insufficient documentation

## 2016-07-08 DIAGNOSIS — M19071 Primary osteoarthritis, right ankle and foot: Secondary | ICD-10-CM | POA: Insufficient documentation

## 2016-07-08 DIAGNOSIS — M19011 Primary osteoarthritis, right shoulder: Secondary | ICD-10-CM | POA: Diagnosis not present

## 2016-07-08 DIAGNOSIS — Z9181 History of falling: Secondary | ICD-10-CM

## 2016-07-08 DIAGNOSIS — S46811A Strain of other muscles, fascia and tendons at shoulder and upper arm level, right arm, initial encounter: Secondary | ICD-10-CM

## 2016-07-08 MED ORDER — MELOXICAM 15 MG PO TABS: 15.0000 mg | ORAL_TABLET | Freq: Every day | ORAL | 0 refills | Status: DC | PRN

## 2016-07-08 NOTE — Progress Notes (Signed)
Subjective:    Patient ID: Sydney Pace, female    DOB: 1955/08/13, 61 y.o.   MRN: [id number]  HPI  Pt in states 2 falls in January. She states can't remember the dates(last end of January). Pt states since the falls had some pain in rt shoulder, tail bone and some rt lower ext swelling post fall.   Pt rt foot feels swollen and some pain.  Pt states she bumped back of her head on of those times. First fall was worse. NO loc. No gross motor or sensory function deficits post fall. Rt trapezius sore since then.  Pt has MS and will see neurologist next month.    Review of Systems  Constitutional: Negative for chills, fatigue and fever.  Respiratory: Negative for cough, choking, shortness of breath and wheezing.   Cardiovascular: Negative for chest pain and palpitations.  Gastrointestinal: Negative for abdominal pain.  Musculoskeletal:       Rt shoulder pain. Rt foot pain. Rt leg swelling. Mild faint posterior neck/head area soreness.  Skin: Negative for rash.  Neurological: Negative for dizziness, speech difficulty, weakness and headaches.  Hematological: Negative for adenopathy. Does not bruise/bleed easily.  Psychiatric/Behavioral: Negative for behavioral problems and confusion.   Past Medical History:  Diagnosis Date  . Arthritis   . Dehydration 06/09/2016  . Depression   . Esophageal reflux 08/08/2012  . GERD (gastroesophageal reflux disease)   . Hypertension   . MS (multiple sclerosis) ([place])   . Other and unspecified hyperlipidemia 08/08/2012  . Thyroid disease      Social History   Social History  . Marital status: Single    Spouse name: N/A  . Number of children: N/A  . Years of education: N/A   Occupational History  . Not on file.   Social History Main Topics  . Smoking status: Never Smoker  . Smokeless tobacco: Never Used  . Alcohol use 0.0 oz/week     Comment: occassional  . Drug use: No  . Sexual activity: Not Currently     Comment: 1st intercourse 13  yo-5 partners   Other Topics Concern  . Not on file   Social History Narrative  . No narrative on file    Past Surgical History:  Procedure Laterality Date  . HYSTEROSCOPY  02, 04, 2008   with D&C  . TMJ ARTHROSCOPY    . TONSILLECTOMY      Family History  Problem Relation Age of Onset  . Heart disease Mother     pacemaker  . Emphysema Mother   . Hypertension Mother   . Heart disease Father   . Diabetes Father   . Colon cancer Neg Hx   . Esophageal cancer Neg Hx   . Rectal cancer Neg Hx   . Stomach cancer Neg Hx   . Heart disease Sister     cad  . Sleep apnea Brother     No Known Allergies  Current Outpatient Prescriptions on File Prior to Visit  Medication Sig Dispense Refill  . brimonidine (ALPHAGAN) 0.2 % ophthalmic solution Place 1 drop into both eyes 3 (three) times daily.     . calcium carbonate 200 MG capsule Take 250 mg by mouth 2 (two) times daily with a meal.    . cetirizine (ZYRTEC) 10 MG tablet Take 10 mg by mouth daily.    . Cholecalciferol (VITAMIN D PO) Take by mouth.    Mariane Baumgarten Calcium (STOOL SOFTENER PO) Take by mouth daily as needed.    Marland Kitchen  furosemide (LASIX) 20 MG tablet TAKE ONE TABLET BY MOUTH IN THE MORNING AS NEEDED FOR  LEG  SWELLING 90 tablet 0  . gabapentin (NEURONTIN) 600 MG tablet Take one or two pills at night 60 tablet 5  . lisinopril-hydrochlorothiazide (PRINZIDE,ZESTORETIC) 20-25 MG tablet TAKE ONE TABLET BY MOUTH ONCE DAILY 90 tablet 1  . methylphenidate (RITALIN) 20 MG tablet Take 1 tablet (20 mg total) by mouth 3 (three) times daily. 90 tablet 0  . modafinil (PROVIGIL) 200 MG tablet Take 1 tablet (200 mg total) by mouth daily. 30 tablet 5  . Multiple Vitamin (MULTIVITAMIN) tablet Take 1 tablet by mouth daily.    Marland Kitchen oxybutynin (DITROPAN) 5 MG tablet Take 1 tablet (5 mg total) by mouth 2 (two) times daily. 60 tablet 11  . ranitidine (ZANTAC) 300 MG tablet Take 1 tablet (300 mg total) by mouth at bedtime as needed for heartburn. 30  tablet 5  . sertraline (ZOLOFT) 100 MG tablet TAKE ONE TABLET BY MOUTH ONCE DAILY 30 tablet 3  . meloxicam (MOBIC) 15 MG tablet Take 1 tablet (15 mg total) by mouth daily as needed for pain. With food (Patient not taking: Reported on 07/08/2016) 14 tablet 0   No current facility-administered medications on file prior to visit.     BP (!) 156/95 (BP Location: Right Arm, Patient Position: Sitting, Cuff Size: Large)   Pulse 68   Temp 97.8 F (36.6 C) (Oral)   Resp 16   Ht 5\' 5"  (1.651 m)   Wt 284 lb (128.8 kg)   SpO2 100%   BMI 47.26 kg/m       Objective:   Physical Exam  General Mental Status- Alert. General Appearance- Not in acute distress.   Skin General: Color- Normal Color. Moisture- Normal Moisture.  Neck Carotid Arteries- Normal color. Moisture- Normal Moisture. No carotid bruits. No JVD.  Chest and Lung Exam Auscultation: Breath Sounds:-Normal.  Cardiovascular Auscultation:Rythm- Regular. Murmurs & Other Heart Sounds:Auscultation of the heart reveals- No Murmurs.  Abdomen Inspection:-Inspeection Normal. Palpation/Percussion:Note:No mass. Palpation and Percussion of the abdomen reveal- Non Tender, Non Distended + BS, no rebound or guarding.    Neurologic Cranial Nerve exam:- CN III-XII intact(No nystagmus), symmetric smile. Drift Test:- No drift. Romberg Exam:- Negative.  Heal to Toe Gait exam:-Normal. Finger to Nose:- Normal/Intact Strength:- 5/5 equal and symmetric strength both upper and lower extremities.  Rt shoulder- anterior aspect tenderness. No crepitus. Good rom. Rt calf- 1+ edema. Neg homans sign. Calf mild swollen compared to rt side. Back- coccyx tender. Rt ankle and foot- mild faint swollen and fiant tender lateral malleoulus and top of foot.      Assessment & Plan:  For your area of pain post fall will get xrays of painful regions.   For calf region pain and swelling will get rt lower ext [place] stat.  For your pain will refill you  mobic.  I will refer you to PT for evaluation of gait.  I want you not to use the tub anymore. Advise use stand up shower. You might get family member to get better shower head since poor flow described.  Follow up in 7 days or as needed  Pt not taking her bp meds. Explained importance and to restart. She agreed she would.

## 2016-07-08 NOTE — Patient Instructions (Addendum)
For your area of pain post fall will get xrays of painful regions.   For calf region pain and swelling will get rt lower ext [place] stat.(4:30 today)  For your pain will refill you mobic.  I will refer you to PT for evaluation of gait.  I want you not to use the tub anymore. Advise use stand up shower. You might get family member to get better shower head since poor flow described.  Follow up in 7 days or as needed  If areas of pain persist and studies negative then consider ortho vs sports med referal.

## 2016-07-08 NOTE — Progress Notes (Signed)
Pre visit review using our clinic review tool, if applicable. No additional management support is needed unless otherwise documented below in the visit note/SLS  

## 2016-07-08 NOTE — Progress Notes (Signed)
Attempt to reach pt via phone, no answer, no ans machine; Letter mailed/SLS 02/07

## 2016-07-08 NOTE — Telephone Encounter (Signed)
Referral to sports med placed.

## 2016-07-15 ENCOUNTER — Other Ambulatory Visit: Payer: Self-pay | Admitting: Family Medicine

## 2016-07-30 ENCOUNTER — Encounter: Payer: Self-pay | Admitting: Neurology

## 2016-07-30 ENCOUNTER — Ambulatory Visit (INDEPENDENT_AMBULATORY_CARE_PROVIDER_SITE_OTHER): Payer: Medicare HMO | Admitting: Neurology

## 2016-07-30 VITALS — BP 154/99 | HR 76 | Resp 18 | Ht 65.0 in | Wt 277.5 lb

## 2016-07-30 DIAGNOSIS — H469 Unspecified optic neuritis: Secondary | ICD-10-CM | POA: Diagnosis not present

## 2016-07-30 DIAGNOSIS — R5383 Other fatigue: Secondary | ICD-10-CM

## 2016-07-30 DIAGNOSIS — G35 Multiple sclerosis: Secondary | ICD-10-CM

## 2016-07-30 DIAGNOSIS — R260 Ataxic gait: Secondary | ICD-10-CM

## 2016-07-30 DIAGNOSIS — G4700 Insomnia, unspecified: Secondary | ICD-10-CM | POA: Diagnosis not present

## 2016-07-30 DIAGNOSIS — N3281 Overactive bladder: Secondary | ICD-10-CM

## 2016-07-30 MED ORDER — METHYLPHENIDATE HCL 20 MG PO TABS: 20.0000 mg | ORAL_TABLET | Freq: Three times a day (TID) | ORAL | 0 refills | Status: DC

## 2016-07-30 MED ORDER — GABAPENTIN 600 MG PO TABS: ORAL_TABLET | ORAL | 5 refills | Status: DC

## 2016-07-30 NOTE — Progress Notes (Signed)
u  [facility]  PATIENT: Sydney Pace DOB: 29-Apr-1956  REFERRING DOCTOR OR PCP:  Penni Homans  _________________________________   HISTORICAL  CHIEF COMPLAINT:  Chief Complaint  Patient presents with  . Multiple Sclerosis    Remains off of MS med.  Sts. she is unable to afford $50 copay for Provigil, so would like a r/f of Methylphenidate. Sts. she had a few falls in January, she believes related to fatigue.  Hit her head on the door once, but no loc, and pain has resolved. She was unable to tolerate Gabapentin 300-300-600,  (drowsiness), so she is only taking the hs dose/fim     HISTORY OF PRESENT ILLNESS:  Sydney Pace is a 61 year old woman with multiple sclerosis.    She feels stable but no no new neurologic symptoms.   Depression and leg pain are her main problems  MS:   She is not on any disease modifying therapy but has had stable MRI's, last one 10/27/2013.     Previously, she was on Copaxone, Avonex and Gilenya but felt worse with all of them.     Gait/strength/sensation:   Gait is stable but balance is mildly off.   She stumbles and occasinally falls -- last time in her bathroom getting out of tub.   She got bruisedfalls.   She continues to report an uncomfortable burnig dysesthesias in her legs.  Arms are fine.   The right leg tires out easily. She also gets muscle aches in both legs. Legs are stiff, especially if she is sitting or laying for long time. She takes gabapentin 600 mg at night because more makes her sleepy.   She was on meloxicam.   .    Bladder:  She feels bladder function is mildly abnormal but stable.  Oxybutynin helps   She reports urinary urgency with frequency, helped by Oxybutynin.   There is mild hesitancy but no recent urinary tract infections.  Vision/vertigo:   Vision is slightly off on the left but stable. She had left ON in past and in the past  She had visual field testing showing a right superior homonymous field cut.       Vertigo has resolved.  Fatigue/sleep:  She has a lot of fatigue that she is both physical and cognitive.   It is worse with an URI.  Marland Kitchen She has received some benefit from methylphenidate 20 mg bid to tid.   Provigil helped a little better was very She has insomnia,  sleep maintenance  Worse than sleep onset helped by gabapentin 300 mg nightly with continued benefit and less hangover.  .   She snores. No one has ever told her that she has apnea.  Mood/cognition: Depression is still presetn and she feel mood goes up and down.     She is on sertraline and tolerates it well.   She sees Dr. Charlett Blake.   She had a major depression that worsened in 2014 and is better but still an issue.   She notes some mild cognitive dysfunction and reduced attention and focus. Specifically there is some difficulty with short-term memory, verbal fluency and processing speed.  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  MRI was performed in 1983 and was consistent with MS.     In the late 1990's, she was started on Copaxone but stopped after several weeks due to skin reactions. She also tried Avonex but had a  rash and stopped. She started Gilenya in January 2014 but stopped after 7 or 8 months due to being more fatigue and having flulike symptoms. In May 2015 we had discussed Aubagio. She decided not to start.  She had an MRI of the brain with and without contrast on 10/27/2013 and I reviewed the study. It showed white matter foci in a pattern consistent with MS. There was no enhancement. When compared to an MRI dated 11/08/2009, there was no interval change.   REVIEW OF SYSTEMS: Constitutional: No fevers, chills, sweats, or change in appetite.   Notes fatigue.   Insomnia Eyes: see above.  No double vision, eye pain..  Some eye redness Ear, nose and throat: No hearing loss, ear pain, nasal congestion, sore throat Cardiovascular: No chest pain, palpitations Respiratory: No  shortness of breath at rest or with exertion.   No wheezes GastrointestinaI: No nausea, vomiting, diarrhea, abdominal pain, fecal incontinence Genitourinary: Mild urinary frequency.  No nocturia. Musculoskeletal: No neck pain, back pain.  Some hip pain Integumentary: No rash, pruritus, skin lesions Neurological: as above Psychiatric: Mild depression at this time, rarely cries, less anxiety Endocrine: No palpitations, diaphoresis, change in appetite, change in weigh or increased thirst Hematologic/Lymphatic: No anemia, purpura, petechiae. Allergic/Immunologic: No itchy/runny eyes, nasal congestion, recent allergic reactions, rashes  ALLERGIES: No Known Allergies  HOME MEDICATIONS:  Current Outpatient Prescriptions:  .  Ascorbic Acid (VITAMIN C) 100 MG tablet, Take 100 mg by mouth daily., Disp: , Rfl:  .  brimonidine (ALPHAGAN) 0.2 % ophthalmic solution, Place 1 drop into both eyes 3 (three) times daily. , Disp: , Rfl:  .  calcium carbonate 200 MG capsule, Take 250 mg by mouth 2 (two) times daily with a meal., Disp: , Rfl:  .  cetirizine (ZYRTEC) 10 MG tablet, Take 10 mg by mouth daily., Disp: , Rfl:  .  Cholecalciferol (VITAMIN D PO), Take by mouth., Disp: , Rfl:  .  Docusate Calcium (STOOL SOFTENER PO), Take by mouth daily as needed., Disp: , Rfl:  .  furosemide (LASIX) 20 MG tablet, TAKE ONE TABLET BY MOUTH IN THE MORNING AS NEEDED FOR  LEG  SWELLING, Disp: 90 tablet, Rfl: 0 .  gabapentin (NEURONTIN) 600 MG tablet, Take one or two pills at night, Disp: 60 tablet, Rfl: 5 .  lisinopril-hydrochlorothiazide (PRINZIDE,ZESTORETIC) 20-25 MG tablet, TAKE ONE TABLET BY MOUTH ONCE DAILY, Disp: 90 tablet, Rfl: 1 .  meloxicam (MOBIC) 15 MG tablet, Take 1 tablet (15 mg total) by mouth daily as needed for pain. With food, Disp: 14 tablet, Rfl: 0 .  methylphenidate (RITALIN) 20 MG tablet, Take 1 tablet (20 mg total) by mouth 3 (three) times daily., Disp: 90 tablet, Rfl: 0 .  modafinil (PROVIGIL) 200  MG tablet, Take 1 tablet (200 mg total) by mouth daily., Disp: 30 tablet, Rfl: 5 .  Multiple Vitamin (MULTIVITAMIN) tablet, Take 1 tablet by mouth daily., Disp: , Rfl:  .  oxybutynin (DITROPAN) 5 MG tablet, Take 1 tablet (5 mg total) by mouth 2 (two) times daily., Disp: 60 tablet, Rfl: 11 .  ranitidine (ZANTAC) 300 MG tablet, Take 1 tablet (300 mg total) by mouth at bedtime as needed for heartburn., Disp: 30 tablet, Rfl: 5 .  sertraline (ZOLOFT) 100 MG tablet, TAKE ONE TABLET BY MOUTH ONCE DAILY, Disp: 30 tablet, Rfl: 3  PAST MEDICAL HISTORY: Past Medical History:  Diagnosis Date  . Arthritis   . Dehydration 06/09/2016  . Depression   . Esophageal reflux 08/08/2012  .  GERD (gastroesophageal reflux disease)   . Hypertension   . MS (multiple sclerosis) ([place])   . Other and unspecified hyperlipidemia 08/08/2012  . Thyroid disease     PAST SURGICAL HISTORY: Past Surgical History:  Procedure Laterality Date  . HYSTEROSCOPY  02, 04, 2008   with D&C  . TMJ ARTHROSCOPY    . TONSILLECTOMY      FAMILY HISTORY: Family History  Problem Relation Age of Onset  . Heart disease Mother     pacemaker  . Emphysema Mother   . Hypertension Mother   . Heart disease Father   . Diabetes Father   . Colon cancer Neg Hx   . Esophageal cancer Neg Hx   . Rectal cancer Neg Hx   . Stomach cancer Neg Hx   . Heart disease Sister     cad  . Sleep apnea Brother     SOCIAL HISTORY:  Social History   Social History  . Marital status: Single    Spouse name: N/A  . Number of children: N/A  . Years of education: N/A   Occupational History  . Not on file.   Social History Main Topics  . Smoking status: Never Smoker  . Smokeless tobacco: Never Used  . Alcohol use 0.0 oz/week     Comment: occassional  . Drug use: No  . Sexual activity: Not Currently     Comment: 1st intercourse 13 yo-5 partners   Other Topics Concern  . Not on file   Social History Narrative  . No narrative on file      PHYSICAL EXAM  Vitals:   07/30/16 0937  BP: (!) 154/99  Pulse: 76  Resp: 18  Weight: 277 lb 8 oz (125.9 kg)  Height: 5\' 5"  (1.651 m)    Body mass index is 46.18 kg/m.   General: The patient is well-developed and well-nourished and in no acute distress  Neurologic Exam  Mental status: The patient is alert and oriented x 3 at the time of the examination. The patient has apparent normal recent and remote memory, with an apparently normal attention span and concentration ability.   Speech is normal.  Cranial nerves: Extraocular movements are full.   Colors are desaturated OS.  She has normal facial strength and sensation.   Trapezius and sternocleidomastoid strength is normal. No dysarthria is noted.    No obvious hearing deficits are noted.  Motor:  Muscle bulk is normal.   Tone is increased in legs, R> L.   Strength is  5 / 5 in all 4 extremities.   Sensory: Sensory testing is intact to pinprick, soft touch and vibration sensation in all 4 extremities.  Coordination: Cerebellar testing reveals good finger-nose-finger bilaterally.  Gait and station: Station is normal.   Gait is normal. Tandem gait is moderately wide.. Romberg is negative.   Reflexes: Deep tendon reflexes are symmetric and normal in arms, 3+ at knees with mild spread and 2 at the ankles.Marland Kitchen        DIAGNOSTIC DATA (LABS, IMAGING, TESTING) - I reviewed patient records, labs, notes, testing and imaging myself where available.  Lab Results  Component Value Date   WBC 8.1 04/22/2016   HGB 12.8 04/22/2016   HCT 38.4 04/22/2016   MCV 89.5 04/22/2016   PLT 242 04/22/2016      Component Value Date/Time   NA 140 04/22/2016 1730   K 4.2 04/22/2016 1730   CL 104 04/22/2016 1730   CO2 27 04/22/2016 1730  GLUCOSE 86 04/22/2016 1730   BUN 17 04/22/2016 1730   CREATININE 0.91 04/22/2016 1730   CALCIUM 9.9 04/22/2016 1730   PROT 7.8 04/22/2016 1730   ALBUMIN 3.8 04/22/2016 1730   AST 20 04/22/2016 1730    ALT 11 04/22/2016 1730   ALKPHOS 99 04/22/2016 1730   BILITOT 0.5 04/22/2016 1730   Lab Results  Component Value Date   CHOL 166 12/10/2014   HDL 48.80 12/10/2014   LDLCALC 109 (H) 12/10/2014   TRIG 40.0 12/10/2014   CHOLHDL 3 12/10/2014   Lab Results  Component Value Date   HGBA1C 5.8 01/02/2009   No results found for: VITAMINB12 Lab Results  Component Value Date   TSH 0.55 04/22/2016       ASSESSMENT AND PLAN  Multiple sclerosis (HCC)  Optic neuritis  Overactive bladder  Other fatigue  Ataxic gait  Insomnia, unspecified type   1.   She will remain off a DMT for her mild MS.   She rprefers not to do an MRI at this time.    2.   Continue Ritalin.    3.   Renew gabapentin.   Take meloxicam for pain prn.   Continue zoloft for depression.     4.   Exercise as tolerated and try to lose weight 5.   She will return to see me in 6 months or sooner if she has new or worsening neurologic symptoms.   Patricia Fargo A. Felecia Shelling, MD, PhD [id number], [id number] AM Certified in Neurology, Clinical Neurophysiology, Sleep Medicine, Pain Medicine and Neuroimaging  [facility] [address] [place], [place] [address] [phone]

## 2016-07-31 ENCOUNTER — Encounter: Payer: Self-pay | Admitting: Family Medicine

## 2016-07-31 ENCOUNTER — Ambulatory Visit (INDEPENDENT_AMBULATORY_CARE_PROVIDER_SITE_OTHER): Payer: Medicare HMO | Admitting: Family Medicine

## 2016-07-31 VITALS — BP 170/92 | HR 78 | Temp 98.2°F | Ht 65.0 in | Wt 276.2 lb

## 2016-07-31 DIAGNOSIS — I10 Essential (primary) hypertension: Secondary | ICD-10-CM | POA: Diagnosis not present

## 2016-07-31 DIAGNOSIS — J189 Pneumonia, unspecified organism: Secondary | ICD-10-CM | POA: Diagnosis not present

## 2016-07-31 MED ORDER — AZITHROMYCIN 250 MG PO TABS: ORAL_TABLET | ORAL | 0 refills | Status: DC

## 2016-07-31 MED ORDER — BENZONATATE 100 MG PO CAPS: 100.0000 mg | ORAL_CAPSULE | Freq: Three times a day (TID) | ORAL | 0 refills | Status: DC | PRN

## 2016-07-31 NOTE — Progress Notes (Signed)
Pre visit review using our clinic review tool, if applicable. No additional management support is needed unless otherwise documented below in the visit note. 

## 2016-07-31 NOTE — Progress Notes (Addendum)
Chief Complaint  Patient presents with  . Cough    product and dry-1 month,chest hurts when she breathing,weak    Sydney Pace here for URI complaints.  Duration: 1 month  Associated symptoms: sinus headache, rhinorrhea, sore throat, chest pain and cough Denies: fevers, shaking, muscle aches, SOB Treatment to date: Mucinex, Theraflu Sick contacts: No  ROS:  Const: Denies fevers HEENT: As noted in HPI Lungs: No SOB  Past Medical History:  Diagnosis Date  . Arthritis   . Dehydration 06/09/2016  . Depression   . Esophageal reflux 08/08/2012  . GERD (gastroesophageal reflux disease)   . Hypertension   . MS (multiple sclerosis) ([place])   . Other and unspecified hyperlipidemia 08/08/2012  . Thyroid disease    Family History  Problem Relation Age of Onset  . Heart disease Mother     pacemaker  . Emphysema Mother   . Hypertension Mother   . Heart disease Father   . Diabetes Father   . Colon cancer Neg Hx   . Esophageal cancer Neg Hx   . Rectal cancer Neg Hx   . Stomach cancer Neg Hx   . Heart disease Sister     cad  . Sleep apnea Brother     BP (!) 170/92 (BP Location: Left Arm, Patient Position: Sitting, Cuff Size: Large)   Pulse 78   Temp 98.2 F (36.8 C) (Oral)   Ht 5\' 5"  (1.651 m)   Wt 276 lb 3.2 oz (125.3 kg)   SpO2 97%   BMI 45.96 kg/m  General: Awake, alert, appears stated age HEENT: AT, , ears patent b/l and TM's neg, nares patent w/o discharge, pharynx pink and without exudates, MMM Neck: No masses or asymmetry Heart: RRR, no murmurs, no bruits Lungs: CTAB, no accessory muscle use Psych: Age appropriate judgment and insight, normal mood and affect  Walking pneumonia - Plan: azithromycin (ZITHROMAX) 250 MG tablet, benzonatate (TESSALON) 100 MG capsule  Essential hypertension  Orders as above. Continue to push fluids, practice good hand hygiene, cover mouth when coughing. Start taking BP medication again.  F/u prn. If starting to experience fevers,  shaking, or shortness of breath, seek immediate care. F/u in 2 weeks for a nurse visit to recheck BP. Pt voiced understanding and agreement to the plan.  [place], DO 07/31/16 10:54 AM

## 2016-07-31 NOTE — Patient Instructions (Addendum)
Start taking your blood pressure medicine again.  Continue to push fluids, practice good hand hygiene, and cover your mouth if you cough.  If you start having fevers, shaking or shortness of breath, seek immediate care.

## 2016-08-12 ENCOUNTER — Other Ambulatory Visit: Payer: Self-pay | Admitting: Neurology

## 2016-08-13 ENCOUNTER — Ambulatory Visit (INDEPENDENT_AMBULATORY_CARE_PROVIDER_SITE_OTHER): Payer: Medicare HMO | Admitting: Family Medicine

## 2016-08-13 VITALS — BP 129/62 | HR 78

## 2016-08-13 DIAGNOSIS — I10 Essential (primary) hypertension: Secondary | ICD-10-CM

## 2016-08-13 NOTE — Progress Notes (Signed)
Noted. Agree with above.  

## 2016-08-13 NOTE — Progress Notes (Signed)
Pre visit review using our clinic tool,if applicable. No additional management support is needed unless otherwise documented below in the visit note.   Patient in for BP check this am per order from Dr. Deloris Ping dated 07/31/16.  BP=129/62 P=78 Per Dr. Nani Ravens no changes needed today, continue taking medications as orderd.

## 2016-08-18 ENCOUNTER — Other Ambulatory Visit: Payer: Self-pay | Admitting: Family Medicine

## 2016-10-27 ENCOUNTER — Telehealth: Payer: Self-pay

## 2016-10-27 NOTE — Telephone Encounter (Signed)
Patient is on the list for [employer] 2018 and may be a good candidate for an AWV. Please let me know if/when appt is scheduled.   

## 2016-10-28 NOTE — Telephone Encounter (Signed)
Left pt message asking to call Allison back directly at [phone] to schedule AWV. Thanks! °

## 2016-11-06 NOTE — Telephone Encounter (Signed)
Left pt message asking to call Allison back directly at [phone] to schedule AWV. Thanks! °

## 2016-11-11 NOTE — Telephone Encounter (Signed)
Scheduled 11/13/16

## 2016-11-12 NOTE — Progress Notes (Deleted)
Subjective:   Sydney Pace is a 61 y.o. female who presents for an Initial Medicare Annual Wellness Visit.  Review of Systems    No ROS.  Medicare Wellness Visit. Additional risk factors are reflected in the social history.   Sleep patterns:  Home Safety/Smoke Alarms: Feels safe in home. Smoke alarms in place.  Living environment; residence and Firearm Safety:  [place] Safety/Bike Helmet: Wears seat belt.   Counseling:   Eye Exam-  Dental-  Female:   Pap-       Mammo-  Last 05/24/15: BI-RADS CATEGORY  1: Negative.     Dexa scan-  Last 08/23/12: normal CCS-    Objective:    There were no vitals filed for this visit. There is no height or weight on file to calculate BMI.   Current Medications (verified) Outpatient Encounter Prescriptions as of 11/13/2016  Medication Sig  . Ascorbic Acid (VITAMIN C) 100 MG tablet Take 100 mg by mouth daily.  . benzonatate (TESSALON) 100 MG capsule Take 1 capsule (100 mg total) by mouth 3 (three) times daily as needed.  . brimonidine (ALPHAGAN) 0.2 % ophthalmic solution Place 1 drop into both eyes 3 (three) times daily.   . calcium carbonate 200 MG capsule Take 250 mg by mouth 2 (two) times daily with a meal.  . cetirizine (ZYRTEC) 10 MG tablet Take 10 mg by mouth daily.  . Cholecalciferol (VITAMIN D PO) Take by mouth.  Mariane Baumgarten Calcium (STOOL SOFTENER PO) Take by mouth daily as needed.  . furosemide (LASIX) 20 MG tablet TAKE ONE TABLET BY MOUTH IN THE MORNING AS NEEDED FOR  LEG  SWELLING  . gabapentin (NEURONTIN) 600 MG tablet Take one or two pills at night  . lisinopril-hydrochlorothiazide (PRINZIDE,ZESTORETIC) 20-25 MG tablet TAKE ONE TABLET BY MOUTH ONCE DAILY  . meloxicam (MOBIC) 15 MG tablet Take 1 tablet (15 mg total) by mouth daily as needed for pain. With food  . methylphenidate (RITALIN) 20 MG tablet Take 1 tablet (20 mg total) by mouth 3 (three) times daily.  . modafinil (PROVIGIL) 200 MG tablet TAKE ONE TABLET BY MOUTH ONCE  DAILY.  . Multiple Vitamin (MULTIVITAMIN) tablet Take 1 tablet by mouth daily.  Marland Kitchen oxybutynin (DITROPAN) 5 MG tablet Take 1 tablet (5 mg total) by mouth 2 (two) times daily.  . ranitidine (ZANTAC) 300 MG tablet Take 1 tablet (300 mg total) by mouth at bedtime as needed for heartburn.  . ranitidine (ZANTAC) 300 MG tablet TAKE ONE TABLET BY MOUTH ONCE DAILY AT BEDTIME.  Marland Kitchen sertraline (ZOLOFT) 100 MG tablet TAKE ONE TABLET BY MOUTH ONCE DAILY   No facility-administered encounter medications on file as of 11/13/2016.     Allergies (verified) Patient has no known allergies.   History: Past Medical History:  Diagnosis Date  . Arthritis   . Dehydration 06/09/2016  . Depression   . Esophageal reflux 08/08/2012  . GERD (gastroesophageal reflux disease)   . Hypertension   . MS (multiple sclerosis) ([place])   . Other and unspecified hyperlipidemia 08/08/2012  . Thyroid disease    Past Surgical History:  Procedure Laterality Date  . HYSTEROSCOPY  02, 04, 2008   with D&C  . TMJ ARTHROSCOPY    . TONSILLECTOMY     Family History  Problem Relation Age of Onset  . Heart disease Mother        pacemaker  . Emphysema Mother   . Hypertension Mother   . Heart disease Father   .  Diabetes Father   . Colon cancer Neg Hx   . Esophageal cancer Neg Hx   . Rectal cancer Neg Hx   . Stomach cancer Neg Hx   . Heart disease Sister        cad  . Sleep apnea Brother    Social History   Occupational History  . Not on file.   Social History Main Topics  . Smoking status: Never Smoker  . Smokeless tobacco: Never Used  . Alcohol use 0.0 oz/week     Comment: occassional  . Drug use: No  . Sexual activity: Not Currently     Comment: 1st intercourse 13 yo-5 partners    Tobacco Counseling Counseling given: Not Answered   Activities of Daily Living In your present state of health, do you have any difficulty performing the following activities: 07/08/2016  Hearing? N  Vision? N  Difficulty  concentrating or making decisions? N  Walking or climbing stairs? Y  Dressing or bathing? N  Doing errands, shopping? N  Some recent data might be hidden    Immunizations and Health Maintenance Immunization History  Administered Date(s) Administered  . Influenza,inj,Quad PF,36+ Mos 03/06/2013, 03/15/2015  . Tdap 08/22/2013   Health Maintenance Due  Topic Date Due  . Hepatitis C Screening  07-08-1955  . HIV Screening  01/07/1970  . COLONOSCOPY  02/10/2015  . PAP SMEAR  05/17/2015    Patient Care Team: [name], MD as PCP - General (Family Medicine) Inda Castle, MD as Consulting Physician (Gastroenterology) Felecia Shelling, Nanine Means, MD (Neurology)  Indicate any recent Medical Services you may have received from other than [facility] providers in the past year (date may be approximate).     Assessment:   This is a routine wellness examination for Sydney Pace. Physical assessment deferred to PCP.  Hearing/Vision screen No exam data present  Dietary issues and exercise activities discussed:   Diet (meal preparation, eat out, water intake, caffeinated beverages, dairy products, fruits and vegetables): {Desc; diets:16563} Breakfast: Lunch:  Dinner:      Goals    None     Depression Screen PHQ 2/9 Scores 03/15/2015  PHQ - 2 Score 0  Exception Documentation Patient refusal    Fall Risk Fall Risk  03/15/2015  Falls in the past year? No    Cognitive Function:        Screening Tests Health Maintenance  Topic Date Due  . Hepatitis C Screening  1956-02-18  . HIV Screening  01/07/1970  . COLONOSCOPY  02/10/2015  . PAP SMEAR  05/17/2015  . INFLUENZA VACCINE  12/30/2016  . MAMMOGRAM  05/23/2017  . TETANUS/TDAP  08/23/2023      Plan:   ***  I have personally reviewed and noted the following in the patient's chart:   . Medical and social history . Use of alcohol, tobacco or illicit drugs  . Current medications and supplements . Functional ability and  status . Nutritional status . Physical activity . Advanced directives . List of other physicians . Hospitalizations, surgeries, and ER visits in previous 12 months . Vitals . Screenings to include cognitive, depression, and falls . Referrals and appointments  In addition, I have reviewed and discussed with patient certain preventive protocols, quality metrics, and best practice recommendations. A written personalized care plan for preventive services as well as general preventive health recommendations were provided to patient.     Shela Nevin, [place]   11/12/2016

## 2016-11-13 ENCOUNTER — Ambulatory Visit: Payer: Medicare HMO | Admitting: *Deleted

## 2016-11-23 NOTE — Telephone Encounter (Signed)
Pt cancelled appt.   Let me know if she reschedules.

## 2016-11-23 NOTE — Telephone Encounter (Signed)
Rescheduled to 11/27/16

## 2016-11-24 ENCOUNTER — Other Ambulatory Visit: Payer: Self-pay | Admitting: Family Medicine

## 2016-11-24 NOTE — Progress Notes (Signed)
Subjective:   Sydney Pace is a 61 y.o. female who presents for an Initial Medicare Annual Wellness Visit.  The Patient was informed that the wellness visit is to identify future health risk and educate and initiate measures that can reduce risk for increased disease through the lifespan.   Describes health as fair, good or great? Fair. Just constantly feels tired.    Review of Systems    No ROS.  Medicare Wellness Visit. Additional risk factors are reflected in the social history. Cardiac Risk Factors include: dyslipidemia;hypertension;advanced age (>10men, >18 women);obesity (BMI >30kg/m2);sedentary lifestyle Sleep patterns: States Neurontin to help her sleep. Wakes1-2x to urinate. Pt states she has insomnia often. Home Safety/Smoke Alarms: Feels safe in home. Smoke alarms in place.  Living environment; residence and Firearm Safety:  Lives alone in 1 story home. No guns. Seat Belt Safety/Bike Helmet: Wears seat belt.   Counseling:   Eye Exam-  Wears reading glasses. Eye doctor yearly. Dental- Only as needed. Dental resource list provided.  Female:   Pap-   Last 05/16/12:  Normal   Mammo- Last 05/24/15:BI-RADS CATEGORY  1: Negative. ORDERED TODAY.   Dexa scan-   Last 08/23/12: normal    ORDERED TODAY. [facility]- Pt states she will schedule at a later date.    Objective:    Today's Vitals   11/27/16 0916  BP: 120/66  Pulse: 90  SpO2: 98%  Weight: 283 lb 12.8 oz (128.7 kg)  Height: 5\' 5"  (1.651 m)   Body mass index is 47.23 kg/m.   Current Medications (verified) Outpatient Encounter Prescriptions as of 11/27/2016  Medication Sig  . Ascorbic Acid (VITAMIN C) 100 MG tablet Take 100 mg by mouth daily.  . calcium carbonate 200 MG capsule Take 250 mg by mouth 2 (two) times daily with a meal.  . cetirizine (ZYRTEC) 10 MG tablet Take 10 mg by mouth daily.  . Cholecalciferol (VITAMIN D PO) Take by mouth.  Mariane Baumgarten Calcium (STOOL SOFTENER PO) Take by mouth daily as needed.  .  furosemide (LASIX) 20 MG tablet TAKE ONE TABLET BY MOUTH IN THE MORNING AS NEEDED FOR  LEG  SWELLING  . gabapentin (NEURONTIN) 600 MG tablet Take one or two pills at night  . lisinopril-hydrochlorothiazide (PRINZIDE,ZESTORETIC) 20-25 MG tablet TAKE ONE TABLET BY MOUTH ONCE DAILY  . modafinil (PROVIGIL) 200 MG tablet TAKE ONE TABLET BY MOUTH ONCE DAILY.  . Multiple Vitamin (MULTIVITAMIN) tablet Take 1 tablet by mouth daily.  Marland Kitchen oxybutynin (DITROPAN) 5 MG tablet Take 1 tablet (5 mg total) by mouth 2 (two) times daily.  . ranitidine (ZANTAC) 300 MG tablet TAKE ONE TABLET BY MOUTH ONCE DAILY AT BEDTIME.  Marland Kitchen sertraline (ZOLOFT) 100 MG tablet TAKE ONE TABLET BY MOUTH ONCE DAILY  . brimonidine (ALPHAGAN) 0.2 % ophthalmic solution Place 1 drop into both eyes 3 (three) times daily.   . meloxicam (MOBIC) 15 MG tablet Take 1 tablet (15 mg total) by mouth daily as needed for pain. With food (Patient not taking: Reported on 11/27/2016)  . methylphenidate (RITALIN) 20 MG tablet Take 1 tablet (20 mg total) by mouth 3 (three) times daily. (Patient not taking: Reported on 11/27/2016)  . ranitidine (ZANTAC) 300 MG tablet Take 1 tablet (300 mg total) by mouth at bedtime as needed for heartburn. (Patient not taking: Reported on 11/27/2016)  . [DISCONTINUED] benzonatate (TESSALON) 100 MG capsule Take 1 capsule (100 mg total) by mouth 3 (three) times daily as needed.  . [DISCONTINUED] lisinopril-hydrochlorothiazide (PRINZIDE,ZESTORETIC) 20-25 MG  tablet TAKE ONE TABLET BY MOUTH ONCE DAILY   No facility-administered encounter medications on file as of 11/27/2016.     Allergies (verified) Patient has no known allergies.   History: Past Medical History:  Diagnosis Date  . Arthritis   . Dehydration 06/09/2016  . Depression   . Esophageal reflux 08/08/2012  . GERD (gastroesophageal reflux disease)   . Hypertension   . MS (multiple sclerosis) ([place])   . Other and unspecified hyperlipidemia 08/08/2012  . Thyroid disease     Past Surgical History:  Procedure Laterality Date  . HYSTEROSCOPY  02, 04, 2008   with D&C  . TMJ ARTHROSCOPY    . TONSILLECTOMY     Family History  Problem Relation Age of Onset  . Heart disease Mother        pacemaker  . Emphysema Mother   . Hypertension Mother   . Heart disease Father   . Diabetes Father   . Heart disease Sister        cad  . Sleep apnea Brother   . Colon cancer Neg Hx   . Esophageal cancer Neg Hx   . Rectal cancer Neg Hx   . Stomach cancer Neg Hx    Social History   Occupational History  . Not on file.   Social History Main Topics  . Smoking status: Never Smoker  . Smokeless tobacco: Never Used  . Alcohol use 0.0 oz/week     Comment: occassional  . Drug use: No  . Sexual activity: No     Comment: 1st intercourse 13 yo-5 partners    Tobacco Counseling Counseling given: No   Activities of Daily Living In your present state of health, do you have any difficulty performing the following activities: 11/27/2016 07/08/2016  Hearing? N N  Vision? N N  Difficulty concentrating or making decisions? N N  Walking or climbing stairs? Y Y  Dressing or bathing? N N  Doing errands, shopping? N N  Preparing Food and eating ? N -  Using the Toilet? N -  In the past six months, have you accidently leaked urine? Y -  Do you have problems with loss of bowel control? N -  Managing your Medications? N -  Managing your Finances? N -  Housekeeping or managing your Housekeeping? N -  Some recent data might be hidden    Immunizations and Health Maintenance Immunization History  Administered Date(s) Administered  . Influenza,inj,Quad PF,36+ Mos 03/06/2013, 03/15/2015  . Tdap 08/22/2013   Health Maintenance Due  Topic Date Due  . Hepatitis C Screening  May 04, 1956  . HIV Screening  01/07/1970  . COLONOSCOPY  02/10/2015  . PAP SMEAR  05/17/2015    Patient Care Team: [name], MD as PCP - General (Family Medicine) Inda Castle, MD as  Consulting Physician (Gastroenterology) Felecia Shelling, Nanine Means, MD (Neurology)  Indicate any recent Medical Services you may have received from other than [facility] providers in the past year (date may be approximate).     Assessment:   This is a routine wellness examination for Sydney Pace. Physical assessment deferred to PCP.   Hearing/Vision screen  Visual Acuity Screening   Right eye Left eye Both eyes  Without correction:     With correction: 20/20 20/20 20/20   Hearing Screening Comments: Able to hear conversational tones w/o difficulty. No issues reported.  Denies any issues  Dietary issues and exercise activities discussed: Current Exercise Habits: The patient does not participate in regular exercise at present  Diet (meal preparation, eat out, water intake, caffeinated beverages, dairy products, fruits and vegetables): in general, an "unhealthy" diet  24 Hour Recall: Breakfast: Cereal  And fruit, water Lunch: skipper Dinner: Chicken dip with corn chips. [employer].   Goals      Patient Stated   . To have a more motivated mindset. (pt-stated)      Depression Screen PHQ 2/9 Scores 11/27/2016 03/15/2015  PHQ - 2 Score 1 0  Exception Documentation - Patient refusal    Fall Risk Fall Risk  03/15/2015  Falls in the past year? No    Cognitive Function: Ad8 score reviewed for issues:  Issues making decisions:no  Less interest in hobbies / activities:no  Repeats questions, stories (family complaining):no  Trouble using ordinary gadgets (microwave, computer, phone):no  Forgets the month or year: no  Mismanaging finances: no  Remembering appts:no  Daily problems with thinking and/or memory:no Ad8 score is=0         Screening Tests Health Maintenance  Topic Date Due  . Hepatitis C Screening  [birth year]  . HIV Screening  01/07/1970  . COLONOSCOPY  02/10/2015  . PAP SMEAR  05/17/2015  . INFLUENZA VACCINE  12/30/2016  . MAMMOGRAM  05/23/2017  . TETANUS/TDAP   08/23/2023      Plan:     Schedule appointment with Dr.Blyth.  Continue to eat heart healthy diet (full of fruits, vegetables, whole grains, lean protein, water--limit salt, fat, and sugar intake) and increase physical activity as tolerated.  Continue doing brain stimulating activities (puzzles, reading, adult coloring books, staying active) to keep memory sharp.   Schedule bone density scan and mammogram. Both were ordered today.  Bring a copy of your advance directives to your next office visit.     I have personally reviewed and noted the following in the patient's chart:   . Medical and social history . Use of alcohol, tobacco or illicit drugs  . Current medications and supplements . Functional ability and status . Nutritional status . Physical activity . Advanced directives . List of other physicians . Hospitalizations, surgeries, and ER visits in previous 12 months . Vitals . Screenings to include cognitive, depression, and falls . Referrals and appointments  In addition, I have reviewed and discussed with patient certain preventive protocols, quality metrics, and best practice recommendations. A written personalized care plan for preventive services as well as general preventive health recommendations were provided to patient.     Naaman Plummer [place], [place]   11/27/2016

## 2016-11-27 ENCOUNTER — Ambulatory Visit (INDEPENDENT_AMBULATORY_CARE_PROVIDER_SITE_OTHER): Payer: Medicare HMO | Admitting: *Deleted

## 2016-11-27 ENCOUNTER — Encounter: Payer: Self-pay | Admitting: *Deleted

## 2016-11-27 VITALS — BP 120/66 | HR 90 | Ht 65.0 in | Wt 283.8 lb

## 2016-11-27 DIAGNOSIS — Z1239 Encounter for other screening for malignant neoplasm of breast: Secondary | ICD-10-CM

## 2016-11-27 DIAGNOSIS — Z0000 Encounter for general adult medical examination without abnormal findings: Secondary | ICD-10-CM | POA: Diagnosis not present

## 2016-11-27 DIAGNOSIS — Z780 Asymptomatic menopausal state: Secondary | ICD-10-CM

## 2016-11-27 NOTE — Patient Instructions (Signed)
Ms. Hashman , Thank you for taking time to come for your Medicare Wellness Visit. I appreciate your ongoing commitment to your health goals. Please review the following plan we discussed and let me know if I can assist you in the future.   These are the goals we discussed: Goals      Patient Stated   . To have a more motivated mindset. (pt-stated)       This is a list of the screening recommended for you and due dates:  Health Maintenance  Topic Date Due  .  Hepatitis C: One time screening is recommended by Center for Disease Control  (CDC) for  adults born from [birth year] through 1966.   [birth year]  . HIV Screening  01/07/1970  . Colon Cancer Screening  02/10/2015  . Pap Smear  05/17/2015  . Flu Shot  12/30/2016  . Mammogram  05/23/2017  . Tetanus Vaccine  08/23/2023   Schedule appointment with Dr.Blyth.  Continue to eat heart healthy diet (full of fruits, vegetables, whole grains, lean protein, water--limit salt, fat, and sugar intake) and increase physical activity as tolerated.  Continue doing brain stimulating activities (puzzles, reading, adult coloring books, staying active) to keep memory sharp.   Schedule bone density scan and mammogram. Both were ordered today.  Bring a copy of your advance directives to your next office visit.   Health Maintenance for Postmenopausal Women Menopause is a normal process in which your reproductive ability comes to an end. This process happens gradually over a span of months to years, usually between the ages of 57 and 52. Menopause is complete when you have missed 12 consecutive menstrual periods. It is important to talk with your health care provider about some of the most common conditions that affect postmenopausal women, such as heart disease, cancer, and bone loss (osteoporosis). Adopting a healthy lifestyle and getting preventive care can help to promote your health and wellness. Those actions can also lower your chances of developing some  of these common conditions. What should I know about menopause? During menopause, you may experience a number of symptoms, such as:  Moderate-to-severe hot flashes.  Night sweats.  Decrease in sex drive.  Mood swings.  Headaches.  Tiredness.  Irritability.  Memory problems.  Insomnia.  Choosing to treat or not to treat menopausal changes is an individual decision that you make with your health care provider. What should I know about hormone replacement therapy and supplements? Hormone therapy products are effective for treating symptoms that are associated with menopause, such as hot flashes and night sweats. Hormone replacement carries certain risks, especially as you become older. If you are thinking about using estrogen or estrogen with progestin treatments, discuss the benefits and risks with your health care provider. What should I know about heart disease and stroke? Heart disease, heart attack, and stroke become more likely as you age. This may be due, in part, to the hormonal changes that your body experiences during menopause. These can affect how your body processes dietary fats, triglycerides, and cholesterol. Heart attack and stroke are both medical emergencies. There are many things that you can do to help prevent heart disease and stroke:  Have your blood pressure checked at least every 1-2 years. High blood pressure causes heart disease and increases the risk of stroke.  If you are 62-62 years old, ask your health care provider if you should take aspirin to prevent a heart attack or a stroke.  Do not use any tobacco products,  including cigarettes, chewing tobacco, or electronic cigarettes. If you need help quitting, ask your health care provider.  It is important to eat a healthy diet and maintain a healthy weight. ? Be sure to include plenty of vegetables, fruits, low-fat dairy products, and lean protein. ? Avoid eating foods that are high in solid fats, added  sugars, or salt (sodium).  Get regular exercise. This is one of the most important things that you can do for your health. ? Try to exercise for at least 150 minutes each week. The type of exercise that you do should increase your heart rate and make you sweat. This is known as moderate-intensity exercise. ? Try to do strengthening exercises at least twice each week. Do these in addition to the moderate-intensity exercise.  Know your numbers.Ask your health care provider to check your cholesterol and your blood glucose. Continue to have your blood tested as directed by your health care provider.  What should I know about cancer screening? There are several types of cancer. Take the following steps to reduce your risk and to catch any cancer development as early as possible. Breast Cancer  Practice breast self-awareness. ? This means understanding how your breasts normally appear and feel. ? It also means doing regular breast self-exams. Let your health care provider know about any changes, no matter how small.  If you are 18 or older, have a clinician do a breast exam (clinical breast exam or CBE) every year. Depending on your age, family history, and medical history, it may be recommended that you also have a yearly breast X-ray (mammogram).  If you have a family history of breast cancer, talk with your health care provider about genetic screening.  If you are at high risk for breast cancer, talk with your health care provider about having an MRI and a mammogram every year.  Breast cancer (BRCA) gene test is recommended for women who have family members with BRCA-related cancers. Results of the assessment will determine the need for genetic counseling and BRCA1 and for BRCA2 testing. BRCA-related cancers include these types: ? Breast. This occurs in males or females. ? Ovarian. ? Tubal. This may also be called fallopian tube cancer. ? Cancer of the abdominal or pelvic lining (peritoneal  cancer). ? Prostate. ? Pancreatic.  Cervical, Uterine, and Ovarian Cancer Your health care provider may recommend that you be screened regularly for cancer of the pelvic organs. These include your ovaries, uterus, and vagina. This screening involves a pelvic exam, which includes checking for microscopic changes to the surface of your cervix (Pap test).  For women ages 21-65, health care providers may recommend a pelvic exam and a Pap test every three years. For women ages 64-65, they may recommend the Pap test and pelvic exam, combined with testing for human papilloma virus (HPV), every five years. Some types of HPV increase your risk of cervical cancer. Testing for HPV may also be done on women of any age who have unclear Pap test results.  Other health care providers may not recommend any screening for nonpregnant women who are considered low risk for pelvic cancer and have no symptoms. Ask your health care provider if a screening pelvic exam is right for you.  If you have had past treatment for cervical cancer or a condition that could lead to cancer, you need Pap tests and screening for cancer for at least 20 years after your treatment. If Pap tests have been discontinued for you, your risk factors (such  as having a new sexual partner) need to be reassessed to determine if you should start having screenings again. Some women have medical problems that increase the chance of getting cervical cancer. In these cases, your health care provider may recommend that you have screening and Pap tests more often.  If you have a family history of uterine cancer or ovarian cancer, talk with your health care provider about genetic screening.  If you have vaginal bleeding after reaching menopause, tell your health care provider.  There are currently no reliable tests available to screen for ovarian cancer.  Lung Cancer Lung cancer screening is recommended for adults 15-10 years old who are at high risk for  lung cancer because of a history of smoking. A yearly low-dose CT scan of the lungs is recommended if you:  Currently smoke.  Have a history of at least 30 pack-years of smoking and you currently smoke or have quit within the past 15 years. A pack-year is smoking an average of one pack of cigarettes per day for one year.  Yearly screening should:  Continue until it has been 15 years since you quit.  Stop if you develop a health problem that would prevent you from having lung cancer treatment.  Colorectal Cancer  This type of cancer can be detected and can often be prevented.  Routine colorectal cancer screening usually begins at age 73 and continues through age 32.  If you have risk factors for colon cancer, your health care provider may recommend that you be screened at an earlier age.  If you have a family history of colorectal cancer, talk with your health care provider about genetic screening.  Your health care provider may also recommend using home test kits to check for hidden blood in your stool.  A small camera at the end of a tube can be used to examine your colon directly (sigmoidoscopy or colonoscopy). This is done to check for the earliest forms of colorectal cancer.  Direct examination of the colon should be repeated every 5-10 years until age 40. However, if early forms of precancerous polyps or small growths are found or if you have a family history or genetic risk for colorectal cancer, you may need to be screened more often.  Skin Cancer  Check your skin from head to toe regularly.  Monitor any moles. Be sure to tell your health care provider: ? About any new moles or changes in moles, especially if there is a change in a mole's shape or color. ? If you have a mole that is larger than the size of a pencil eraser.  If any of your family members has a history of skin cancer, especially at a young age, talk with your health care provider about genetic  screening.  Always use sunscreen. Apply sunscreen liberally and repeatedly throughout the day.  Whenever you are outside, protect yourself by wearing long sleeves, pants, a wide-brimmed hat, and sunglasses.  What should I know about osteoporosis? Osteoporosis is a condition in which bone destruction happens more quickly than new bone creation. After menopause, you may be at an increased risk for osteoporosis. To help prevent osteoporosis or the bone fractures that can happen because of osteoporosis, the following is recommended:  If you are 46-67 years old, get at least 1,000 mg of calcium and at least 600 mg of vitamin D per day.  If you are older than age 61 but younger than age 36, get at least 1,200 mg of calcium  and at least 600 mg of vitamin D per day.  If you are older than age 88, get at least 1,200 mg of calcium and at least 800 mg of vitamin D per day.  Smoking and excessive alcohol intake increase the risk of osteoporosis. Eat foods that are rich in calcium and vitamin D, and do weight-bearing exercises several times each week as directed by your health care provider. What should I know about how menopause affects my mental health? Depression may occur at any age, but it is more common as you become older. Common symptoms of depression include:  Low or sad mood.  Changes in sleep patterns.  Changes in appetite or eating patterns.  Feeling an overall lack of motivation or enjoyment of activities that you previously enjoyed.  Frequent crying spells.  Talk with your health care provider if you think that you are experiencing depression. What should I know about immunizations? It is important that you get and maintain your immunizations. These include:  Tetanus, diphtheria, and pertussis (Tdap) booster vaccine.  Influenza every year before the flu season begins.  Pneumonia vaccine.  Shingles vaccine.  Your health care provider may also recommend other  immunizations. This information is not intended to replace advice given to you by your health care provider. Make sure you discuss any questions you have with your health care provider. Document Released: 07/10/2005 Document Revised: 12/06/2015 Document Reviewed: 02/19/2015 Elsevier Interactive Patient Education  2018 [employer].

## 2016-12-03 ENCOUNTER — Other Ambulatory Visit: Payer: Self-pay | Admitting: Family Medicine

## 2016-12-03 DIAGNOSIS — E2839 Other primary ovarian failure: Secondary | ICD-10-CM

## 2016-12-27 ENCOUNTER — Other Ambulatory Visit: Payer: Self-pay | Admitting: Family Medicine

## 2017-02-10 ENCOUNTER — Ambulatory Visit
Admission: RE | Admit: 2017-02-10 | Discharge: 2017-02-10 | Disposition: A | Discharge status: Home / Self Care | Payer: Medicare HMO | Source: Ambulatory Visit | Attending: Family Medicine | Admitting: Family Medicine

## 2017-02-10 DIAGNOSIS — Z1239 Encounter for other screening for malignant neoplasm of breast: Secondary | ICD-10-CM

## 2017-02-10 DIAGNOSIS — Z1231 Encounter for screening mammogram for malignant neoplasm of breast: Secondary | ICD-10-CM | POA: Diagnosis not present

## 2017-02-10 DIAGNOSIS — M8589 Other specified disorders of bone density and structure, multiple sites: Secondary | ICD-10-CM | POA: Diagnosis not present

## 2017-02-10 DIAGNOSIS — Z780 Asymptomatic menopausal state: Secondary | ICD-10-CM | POA: Diagnosis not present

## 2017-02-10 DIAGNOSIS — E2839 Other primary ovarian failure: Secondary | ICD-10-CM

## 2017-02-11 ENCOUNTER — Encounter: Payer: Self-pay | Admitting: Neurology

## 2017-02-11 ENCOUNTER — Ambulatory Visit (INDEPENDENT_AMBULATORY_CARE_PROVIDER_SITE_OTHER): Payer: Medicare HMO | Admitting: Neurology

## 2017-02-11 VITALS — BP 134/82 | HR 90 | Resp 18 | Ht 65.0 in | Wt 284.5 lb

## 2017-02-11 DIAGNOSIS — R69 Illness, unspecified: Secondary | ICD-10-CM | POA: Diagnosis not present

## 2017-02-11 DIAGNOSIS — F329 Major depressive disorder, single episode, unspecified: Secondary | ICD-10-CM | POA: Diagnosis not present

## 2017-02-11 DIAGNOSIS — R5383 Other fatigue: Secondary | ICD-10-CM

## 2017-02-11 DIAGNOSIS — R260 Ataxic gait: Secondary | ICD-10-CM | POA: Diagnosis not present

## 2017-02-11 DIAGNOSIS — G35 Multiple sclerosis: Secondary | ICD-10-CM | POA: Diagnosis not present

## 2017-02-11 DIAGNOSIS — H469 Unspecified optic neuritis: Secondary | ICD-10-CM

## 2017-02-11 DIAGNOSIS — G4700 Insomnia, unspecified: Secondary | ICD-10-CM | POA: Diagnosis not present

## 2017-02-11 DIAGNOSIS — N3281 Overactive bladder: Secondary | ICD-10-CM | POA: Diagnosis not present

## 2017-02-11 DIAGNOSIS — F32A Depression, unspecified: Secondary | ICD-10-CM

## 2017-02-11 MED ORDER — METHYLPHENIDATE HCL 20 MG PO TABS: 20.0000 mg | ORAL_TABLET | Freq: Three times a day (TID) | ORAL | 0 refills | Status: DC

## 2017-02-11 MED ORDER — MODAFINIL 200 MG PO TABS: 200.0000 mg | ORAL_TABLET | Freq: Every day | ORAL | 5 refills | Status: DC

## 2017-02-11 MED ORDER — GABAPENTIN 600 MG PO TABS: ORAL_TABLET | ORAL | 11 refills | Status: DC

## 2017-02-11 NOTE — Progress Notes (Signed)
u  [facility]  PATIENT: Sydney Pace DOB: Mar 02, 1956  REFERRING DOCTOR OR PCP:  Penni Homans  _________________________________   HISTORICAL  CHIEF COMPLAINT:  Chief Complaint  Patient presents with  . Multiple Sclerosis    Remains off of dmt and denies new or worsening sx.  Does c/o bilat knee pain related to arthritis/fim     HISTORY OF PRESENT ILLNESS:  Sydney Pace is a 61 year old woman with multiple sclerosis.        Update 02/11/2017:   She feels her gait is mostly stable but she has more trouble walking fast and has stumbled some but has no falls.   She denies significant weakness or numbness in the arms and legs.    She feels her bladder function is stable with urgency and frequency but no incontinence.     She had left ON in the past and that eye is still blurry.    Colors are not desaturated, however.   Fatigue is a little worse recently.   She is on Ritalin and modafinil (but does not take every day).   She has some insomnia and gabapentin has not helped as much lately.    She has depression.   She is on Zoloft.  Mood was much worse 3-4 years ago when she had a lot of crying spells.   She notes mild cognitive issues.    She is off any disease modifying therapy. She has been on Avonex, Copaxone and Gilenya in the past but had difficulty tolerating them.  ________________________________ From 07/30/2016 Gait/strength/sensation:   Gait is stable but balance is mildly off.   She stumbles and occasinally falls -- last time in her bathroom getting out of tub.   She got bruisedfalls.   She continues to report an uncomfortable burnig dysesthesias in her legs.  Arms are fine.   The right leg tires out easily. She also gets muscle aches in both legs. Legs are stiff, especially if she is sitting or laying for long time. She takes gabapentin 600 mg at night because more makes her sleepy.   She was on meloxicam.   .    Bladder:  She feels bladder function is mildly  abnormal but stable.  Oxybutynin helps   She reports urinary urgency with frequency, helped by Oxybutynin.   There is mild hesitancy but no recent urinary tract infections.  Vision/vertigo:   Vision is slightly off on the left but stable. She had left ON in past and in the past  She had visual field testing showing a right superior homonymous field cut.      Vertigo has resolved.  Fatigue/sleep:  She has a lot of fatigue that she is both physical and cognitive.   It is worse with an URI.  Marland Kitchen She has received some benefit from methylphenidate 20 mg bid to tid.   Provigil helped a little better was very She has insomnia,  sleep maintenance  Worse than sleep onset helped by gabapentin 300 mg nightly with continued benefit and less hangover.  .   She snores. No one has ever told her that she has apnea.  Mood/cognition: Depression is still presetn and she feel mood goes up and down.     She is on sertraline and tolerates it well.   She sees Dr. Charlett Blake.   She had a major depression that worsened in 2014 and is better but still an issue.   She notes some mild cognitive dysfunction and reduced attention and  focus. Specifically there is some difficulty with short-term memory, verbal fluency and processing speed.  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  MRI was performed in 1983 and was consistent with MS.     In the late 1990's, she was started on Copaxone but stopped after several weeks due to skin reactions. She also tried Avonex but had a rash and stopped. She started Gilenya in January 2014 but stopped after 7 or 8 months due to being more fatigue and having flulike symptoms. In May 2015 we had discussed Aubagio. She decided not to start.  She had an MRI of the brain with and without contrast on 10/27/2013 and I reviewed the study. It showed white matter foci in a pattern consistent with MS. There was no enhancement. When compared to an MRI dated  11/08/2009, there was no interval change.   REVIEW OF SYSTEMS: Constitutional: No fevers, chills, sweats, or change in appetite.   Notes fatigue.   Insomnia Eyes: see above.  No double vision, eye pain..  Some eye redness Ear, nose and throat: No hearing loss, ear pain, nasal congestion, sore throat Cardiovascular: No chest pain, palpitations Respiratory: No shortness of breath at rest or with exertion.   No wheezes GastrointestinaI: No nausea, vomiting, diarrhea, abdominal pain, fecal incontinence Genitourinary: Mild urinary frequency.  No nocturia. Musculoskeletal: No neck pain, back pain.  Some hip pain Integumentary: No rash, pruritus, skin lesions Neurological: as above Psychiatric: Mild depression at this time, rarely cries, less anxiety Endocrine: No palpitations, diaphoresis, change in appetite, change in weigh or increased thirst Hematologic/Lymphatic: No anemia, purpura, petechiae. Allergic/Immunologic: No itchy/runny eyes, nasal congestion, recent allergic reactions, rashes  ALLERGIES: No Known Allergies  HOME MEDICATIONS:  Current Outpatient Prescriptions:  .  Ascorbic Acid (VITAMIN C) 100 MG tablet, Take 100 mg by mouth daily., Disp: , Rfl:  .  calcium carbonate 200 MG capsule, Take 250 mg by mouth 2 (two) times daily with a meal., Disp: , Rfl:  .  cetirizine (ZYRTEC) 10 MG tablet, Take 10 mg by mouth daily., Disp: , Rfl:  .  Cholecalciferol (VITAMIN D PO), Take by mouth., Disp: , Rfl:  .  Docusate Calcium (STOOL SOFTENER PO), Take by mouth daily as needed., Disp: , Rfl:  .  furosemide (LASIX) 20 MG tablet, TAKE ONE TABLET BY MOUTH IN THE MORNING AS NEEDED FOR  LEG  SWELLING, Disp: 90 tablet, Rfl: 0 .  gabapentin (NEURONTIN) 600 MG tablet, Take one or two pills at night, Disp: 60 tablet, Rfl: 11 .  lisinopril-hydrochlorothiazide (PRINZIDE,ZESTORETIC) 20-25 MG tablet, TAKE ONE TABLET BY MOUTH ONCE DAILY, Disp: 90 tablet, Rfl: 1 .  meloxicam (MOBIC) 15 MG tablet, Take  1 tablet (15 mg total) by mouth daily as needed for pain. With food, Disp: 14 tablet, Rfl: 0 .  methylphenidate (RITALIN) 20 MG tablet, Take 1 tablet (20 mg total) by mouth 3 (three) times daily., Disp: 90 tablet, Rfl: 0 .  modafinil (PROVIGIL) 200 MG tablet, Take 1 tablet (200 mg total) by mouth daily., Disp: 30 tablet, Rfl: 5 .  Multiple Vitamin (MULTIVITAMIN) tablet, Take 1 tablet by mouth daily., Disp: , Rfl:  .  oxybutynin (DITROPAN) 5 MG tablet, Take 1 tablet (5 mg total) by mouth 2 (two) times daily., Disp: 60 tablet, Rfl: 11 .  sertraline (ZOLOFT) 100 MG tablet, TAKE 1 TABLET BY MOUTH ONCE DAILY, Disp: 30 tablet, Rfl: 3  PAST MEDICAL  HISTORY: Past Medical History:  Diagnosis Date  . Arthritis   . Dehydration 06/09/2016  . Depression   . Esophageal reflux 08/08/2012  . GERD (gastroesophageal reflux disease)   . Hypertension   . MS (multiple sclerosis) ([place])   . Other and unspecified hyperlipidemia 08/08/2012  . Thyroid disease     PAST SURGICAL HISTORY: Past Surgical History:  Procedure Laterality Date  . HYSTEROSCOPY  02, 04, 2008   with D&C  . TMJ ARTHROSCOPY    . TONSILLECTOMY      FAMILY HISTORY: Family History  Problem Relation Age of Onset  . Heart disease Mother        pacemaker  . Emphysema Mother   . Hypertension Mother   . Heart disease Father   . Diabetes Father   . Heart disease Sister        cad  . Sleep apnea Brother   . Colon cancer Neg Hx   . Esophageal cancer Neg Hx   . Rectal cancer Neg Hx   . Stomach cancer Neg Hx     SOCIAL HISTORY:  Social History   Social History  . Marital status: Single    Spouse name: N/A  . Number of children: N/A  . Years of education: N/A   Occupational History  . Not on file.   Social History Main Topics  . Smoking status: Never Smoker  . Smokeless tobacco: Never Used  . Alcohol use 0.0 oz/week     Comment: occassional  . Drug use: No  . Sexual activity: No     Comment: 1st intercourse 89 yo-5  partners   Other Topics Concern  . Not on file   Social History Narrative  . No narrative on file     PHYSICAL EXAM  Vitals:   02/11/17 0954  BP: 134/82  Pulse: 90  Resp: 18  Weight: 284 lb 8 oz (129 kg)  Height: 5\' 5"  (1.651 m)    Body mass index is 47.34 kg/m.   General: The patient is well-developed and well-nourished and in no acute distress  Neurologic Exam  Mental status: The patient is alert and oriented x 3 at the time of the examination. The patient has apparent normal recent and remote memory, with an apparently normal attention span and concentration ability.   Speech is normal.  Cranial nerves: Extraocular movements are full.   Colors are mildly desaturated OS.  Facial strength and sensation is normal. Trapezius strength is normal.. No dysarthria is noted.    No obvious hearing deficits are noted.  Motor:  Muscle bulk is normal.   Muscle tone is increased in her legs, right greater than left. Strength is 5/5 in the arms and legs.   Sensory: She has intact sensation to touch and vibration in the arms and legs..  Coordination: Cerebellar testing reveals good finger-nose-finger bilaterally.  Gait and station: Station is normal.   The gait is nearly normal but her tandem gait is wide... Romberg is negative.   Reflexes: Deep tendon reflexes are symmetric and normal in arms, 3+ at knees with mild spread and 2 at the ankles.Marland Kitchen        DIAGNOSTIC DATA (LABS, IMAGING, TESTING) - I reviewed patient records, labs, notes, testing and imaging myself where available.  Lab Results  Component Value Date   WBC 8.1 04/22/2016   HGB 12.8 04/22/2016   HCT 38.4 04/22/2016   MCV 89.5 04/22/2016   PLT 242 04/22/2016      Component Value  Date/Time   NA 140 04/22/2016 1730   K 4.2 04/22/2016 1730   CL 104 04/22/2016 1730   CO2 27 04/22/2016 1730   GLUCOSE 86 04/22/2016 1730   BUN 17 04/22/2016 1730   CREATININE 0.91 04/22/2016 1730   CALCIUM 9.9 04/22/2016 1730    PROT 7.8 04/22/2016 1730   ALBUMIN 3.8 04/22/2016 1730   AST 20 04/22/2016 1730   ALT 11 04/22/2016 1730   ALKPHOS 99 04/22/2016 1730   BILITOT 0.5 04/22/2016 1730   Lab Results  Component Value Date   CHOL 166 12/10/2014   HDL 48.80 12/10/2014   LDLCALC 109 (H) 12/10/2014   TRIG 40.0 12/10/2014   CHOLHDL 3 12/10/2014   Lab Results  Component Value Date   HGBA1C 5.8 01/02/2009   No results found for: VITAMINB12 Lab Results  Component Value Date   TSH 0.55 04/22/2016       ASSESSMENT AND PLAN  Optic neuritis  Multiple sclerosis ([place]) - Plan: MR BRAIN W WO CONTRAST  Overactive bladder  Depression, unspecified depression type  Other fatigue  Insomnia, unspecified type  Ataxic gait   1.   Her MS has been mostly stable over the past year. She remains off of a disease modifying therapy. Her last MRI was in 2015 and we need to obtain another MRI of the brain to compare side-by-side and see if there has been subclinical progression. If this is occurring, we discussed that I want her to get back on a disease modifying therapy.   2.   Continue Ritalin and Provigil 3.   She will continue gabapentin for insomnia and dysesthesias and Zoloft for her mood 4.   Exercise as tolerated and try to lose weight 5.   She will return to see me in 6 months or sooner if she has new or worsening neurologic symptoms.   Richard A. Felecia Shelling, MD, PhD 5/37/4827, 07:86 AM Certified in Neurology, Clinical Neurophysiology, Sleep Medicine, Pain Medicine and Neuroimaging  [facility] [address] [place],  [address] [phone]

## 2017-02-15 ENCOUNTER — Other Ambulatory Visit: Payer: Self-pay | Admitting: Neurology

## 2017-02-28 ENCOUNTER — Other Ambulatory Visit: Payer: Medicare HMO

## 2017-02-28 ENCOUNTER — Ambulatory Visit
Admission: RE | Admit: 2017-02-28 | Discharge: 2017-02-28 | Disposition: A | Discharge status: Home / Self Care | Payer: Medicare HMO | Source: Ambulatory Visit | Attending: Neurology | Admitting: Neurology

## 2017-02-28 DIAGNOSIS — G35 Multiple sclerosis: Secondary | ICD-10-CM

## 2017-02-28 MED ORDER — GADOBENATE DIMEGLUMINE 529 MG/ML IV SOLN
20.0000 mL | Freq: Once | INTRAVENOUS | Status: AC | PRN
  Administered 2017-02-28: 20 mL via INTRAVENOUS

## 2017-03-02 ENCOUNTER — Telehealth: Payer: Self-pay | Admitting: *Deleted

## 2017-03-02 DIAGNOSIS — J320 Chronic maxillary sinusitis: Secondary | ICD-10-CM

## 2017-03-02 MED ORDER — AZITHROMYCIN 250 MG PO TABS: ORAL_TABLET | ORAL | 0 refills | Status: DC

## 2017-03-02 NOTE — Telephone Encounter (Signed)
Spoke with Kendrick Fries and per RAS, reviewed MRI results as below.  She verbalized understanding of same, is agreeable with taking Z-pk.  Rx. escribed to [facility] per her request/fim

## 2017-03-02 NOTE — Telephone Encounter (Signed)
-----   Message from Britt Bottom, MD sent at 03/01/2017  3:19 PM EDT ----- Please let her know that the MRI of the brain does not show any new MS lesions.    She has mild chronic sinusitis. Please call in a Z-Pak.

## 2017-04-14 DIAGNOSIS — H04123 Dry eye syndrome of bilateral lacrimal glands: Secondary | ICD-10-CM | POA: Diagnosis not present

## 2017-04-14 DIAGNOSIS — G35 Multiple sclerosis: Secondary | ICD-10-CM | POA: Diagnosis not present

## 2017-04-14 DIAGNOSIS — H2513 Age-related nuclear cataract, bilateral: Secondary | ICD-10-CM | POA: Diagnosis not present

## 2017-04-14 DIAGNOSIS — H47292 Other optic atrophy, left eye: Secondary | ICD-10-CM | POA: Diagnosis not present

## 2017-04-14 DIAGNOSIS — H40023 Open angle with borderline findings, high risk, bilateral: Secondary | ICD-10-CM | POA: Diagnosis not present

## 2017-04-20 DIAGNOSIS — R69 Illness, unspecified: Secondary | ICD-10-CM | POA: Diagnosis not present

## 2017-05-13 ENCOUNTER — Other Ambulatory Visit: Payer: Self-pay | Admitting: Family Medicine

## 2017-05-20 ENCOUNTER — Telehealth: Payer: Self-pay | Admitting: Neurology

## 2017-05-20 MED ORDER — METHYLPHENIDATE HCL 20 MG PO TABS: 20.0000 mg | ORAL_TABLET | Freq: Three times a day (TID) | ORAL | 0 refills | Status: DC

## 2017-05-20 NOTE — Telephone Encounter (Signed)
Rx. up front [facility]/fim 

## 2017-05-20 NOTE — Telephone Encounter (Signed)
Rx. awaiting RAS sig/fim 

## 2017-05-20 NOTE — Telephone Encounter (Signed)
Pt request refill for methylphenidate (RITALIN) 20 MG tablet. Pt is wanting to pick up RX tomorrow. She is aware the clinic closes at noon and will reopen on 12/26.

## 2017-07-06 ENCOUNTER — Encounter: Payer: Self-pay | Admitting: Family Medicine

## 2017-07-06 ENCOUNTER — Ambulatory Visit (INDEPENDENT_AMBULATORY_CARE_PROVIDER_SITE_OTHER): Payer: Medicare HMO | Admitting: Family Medicine

## 2017-07-06 VITALS — BP 120/74 | HR 93 | Temp 98.0°F | Resp 18 | Wt 285.4 lb

## 2017-07-06 DIAGNOSIS — G35 Multiple sclerosis: Secondary | ICD-10-CM

## 2017-07-06 DIAGNOSIS — Z860100 Personal history of colon polyps, unspecified: Secondary | ICD-10-CM

## 2017-07-06 DIAGNOSIS — R109 Unspecified abdominal pain: Secondary | ICD-10-CM

## 2017-07-06 DIAGNOSIS — M79672 Pain in left foot: Secondary | ICD-10-CM | POA: Diagnosis not present

## 2017-07-06 DIAGNOSIS — R69 Illness, unspecified: Secondary | ICD-10-CM | POA: Diagnosis not present

## 2017-07-06 DIAGNOSIS — Z1211 Encounter for screening for malignant neoplasm of colon: Secondary | ICD-10-CM

## 2017-07-06 DIAGNOSIS — Z124 Encounter for screening for malignant neoplasm of cervix: Secondary | ICD-10-CM

## 2017-07-06 DIAGNOSIS — G8929 Other chronic pain: Secondary | ICD-10-CM

## 2017-07-06 DIAGNOSIS — M8580 Other specified disorders of bone density and structure, unspecified site: Secondary | ICD-10-CM | POA: Diagnosis not present

## 2017-07-06 DIAGNOSIS — I10 Essential (primary) hypertension: Secondary | ICD-10-CM | POA: Diagnosis not present

## 2017-07-06 DIAGNOSIS — R1013 Epigastric pain: Secondary | ICD-10-CM

## 2017-07-06 DIAGNOSIS — E782 Mixed hyperlipidemia: Secondary | ICD-10-CM | POA: Diagnosis not present

## 2017-07-06 DIAGNOSIS — Z0000 Encounter for general adult medical examination without abnormal findings: Secondary | ICD-10-CM | POA: Diagnosis not present

## 2017-07-06 DIAGNOSIS — F32A Depression, unspecified: Secondary | ICD-10-CM

## 2017-07-06 DIAGNOSIS — F329 Major depressive disorder, single episode, unspecified: Secondary | ICD-10-CM | POA: Diagnosis not present

## 2017-07-06 DIAGNOSIS — M1990 Unspecified osteoarthritis, unspecified site: Secondary | ICD-10-CM

## 2017-07-06 DIAGNOSIS — E669 Obesity, unspecified: Secondary | ICD-10-CM

## 2017-07-06 DIAGNOSIS — G35D Multiple sclerosis, unspecified: Secondary | ICD-10-CM

## 2017-07-06 HISTORY — DX: Unspecified osteoarthritis, unspecified site: M19.90

## 2017-07-06 LAB — COMPREHENSIVE METABOLIC PANEL
ALT: 15 U/L (ref 0–35)
AST: 19 U/L (ref 0–37)
Albumin: 4 g/dL (ref 3.5–5.2)
Alkaline Phosphatase: 86 U/L (ref 39–117)
BUN: 24 mg/dL — AB (ref 6–23)
CHLORIDE: 102 meq/L (ref 96–112)
CO2: 33 mEq/L — ABNORMAL HIGH (ref 19–32)
Calcium: 10.6 mg/dL — ABNORMAL HIGH (ref 8.4–10.5)
Creatinine, Ser: 0.93 mg/dL (ref 0.40–1.20)
GFR: 78.44 mL/min (ref >60.00)
GLUCOSE: 98 mg/dL (ref 70–99)
POTASSIUM: 4.6 meq/L (ref 3.5–5.1)
SODIUM: 140 meq/L (ref 135–145)
Total Bilirubin: 0.6 mg/dL (ref 0.2–1.2)
Total Protein: 8.3 g/dL (ref 6.0–8.3)

## 2017-07-06 LAB — LIPID PANEL
Cholesterol: 166 mg/dL (ref 0–200)
HDL: 43.5 mg/dL (ref >39.00)
LDL Cholesterol: 113 mg/dL — ABNORMAL HIGH (ref 0–99)
NONHDL: 122.76
Total CHOL/HDL Ratio: 4
Triglycerides: 48 mg/dL (ref 0.0–149.0)
VLDL: 9.6 mg/dL (ref 0.0–40.0)

## 2017-07-06 LAB — TSH: TSH: 0.49 u[IU]/mL (ref 0.35–4.50)

## 2017-07-06 LAB — CBC
HEMATOCRIT: 39.2 % (ref 36.0–46.0)
Hemoglobin: 13.3 g/dL (ref 12.0–15.0)
MCHC: 33.9 g/dL (ref 30.0–36.0)
MCV: 89 fl (ref 78.0–100.0)
Platelets: 270 10*3/uL (ref 150.0–400.0)
RBC: 4.41 Mil/uL (ref 3.87–5.11)
RDW: 14.4 % (ref 11.5–15.5)
WBC: 7.2 10*3/uL (ref 4.0–10.5)

## 2017-07-06 LAB — SEDIMENTATION RATE: Sed Rate: 19 mm/hr (ref 0–30)

## 2017-07-06 MED ORDER — HYOSCYAMINE SULFATE 0.125 MG SL SUBL: 0.1250 mg | SUBLINGUAL_TABLET | SUBLINGUAL | 1 refills | Status: DC | PRN

## 2017-07-06 MED ORDER — SERTRALINE HCL 100 MG PO TABS: 100.0000 mg | ORAL_TABLET | Freq: Every day | ORAL | 1 refills | Status: DC

## 2017-07-06 NOTE — Patient Instructions (Addendum)
Shingrix is the new shingles shot 2 shots over 2-6 months at [place] Years, Female Preventive care refers to lifestyle choices and visits with your health care provider that can promote health and wellness. What does preventive care include?  A yearly physical exam. This is also called an annual well check.  Dental exams once or twice a year.  Routine eye exams. Ask your health care provider how often you should have your eyes checked.  Personal lifestyle choices, including: ? Daily care of your teeth and gums. ? Regular physical activity. ? Eating a healthy diet. ? Avoiding tobacco and drug use. ? Limiting alcohol use. ? Practicing safe sex. ? Taking low-dose aspirin daily starting at age 35. ? Taking vitamin and mineral supplements as recommended by your health care provider. What happens during an annual well check? The services and screenings done by your health care provider during your annual well check will depend on your age, overall health, lifestyle risk factors, and family history of disease. Counseling Your health care provider may ask you questions about your:  Alcohol use.  Tobacco use.  Drug use.  Emotional well-being.  Home and relationship well-being.  Sexual activity.  Eating habits.  Work and work Statistician.  Method of birth control.  Menstrual cycle.  Pregnancy history.  Screening You may have the following tests or measurements:  Height, weight, and BMI.  Blood pressure.  Lipid and cholesterol levels. These may be checked every 5 years, or more frequently if you are over 62 years old.  Skin check.  Lung cancer screening. You may have this screening every year starting at age 28 if you have a 30-pack-year history of smoking and currently smoke or have quit within the past 15 years.  Fecal occult blood test (FOBT) of the stool. You may have this test every year starting at age 32.  Flexible sigmoidoscopy or  colonoscopy. You may have a sigmoidoscopy every 5 years or a colonoscopy every 10 years starting at age 68.  Hepatitis C blood test.  Hepatitis B blood test.  Sexually transmitted disease (STD) testing.  Diabetes screening. This is done by checking your blood sugar (glucose) after you have not eaten for a while (fasting). You may have this done every 1-3 years.  Mammogram. This may be done every 1-2 years. Talk to your health care provider about when you should start having regular mammograms. This may depend on whether you have a family history of breast cancer.  BRCA-related cancer screening. This may be done if you have a family history of breast, ovarian, tubal, or peritoneal cancers.  Pelvic exam and Pap test. This may be done every 3 years starting at age 15. Starting at age 14, this may be done every 5 years if you have a Pap test in combination with an HPV test.  Bone density scan. This is done to screen for osteoporosis. You may have this scan if you are at high risk for osteoporosis.  Discuss your test results, treatment options, and if necessary, the need for more tests with your health care provider. Vaccines Your health care provider may recommend certain vaccines, such as:  Influenza vaccine. This is recommended every year.  Tetanus, diphtheria, and acellular pertussis (Tdap, Td) vaccine. You may need a Td booster every 10 years.  Varicella vaccine. You may need this if you have not been vaccinated.  Zoster vaccine. You may need this after age 21.  Measles, mumps, and rubella (MMR) vaccine. You  may need at least one dose of MMR if you were born in 1957 or later. You may also need a second dose.  Pneumococcal 13-valent conjugate (PCV13) vaccine. You may need this if you have certain conditions and were not previously vaccinated.  Pneumococcal polysaccharide (PPSV23) vaccine. You may need one or two doses if you smoke cigarettes or if you have certain  conditions.  Meningococcal vaccine. You may need this if you have certain conditions.  Hepatitis A vaccine. You may need this if you have certain conditions or if you travel or work in places where you may be exposed to hepatitis A.  Hepatitis B vaccine. You may need this if you have certain conditions or if you travel or work in places where you may be exposed to hepatitis B.  Haemophilus influenzae type b (Hib) vaccine. You may need this if you have certain conditions.  Talk to your health care provider about which screenings and vaccines you need and how often you need them. This information is not intended to replace advice given to you by your health care provider. Make sure you discuss any questions you have with your health care provider. Document Released: 06/14/2015 Document Revised: 02/05/2016 Document Reviewed: 03/19/2015 Elsevier Interactive Patient Education  [employer].

## 2017-07-06 NOTE — Assessment & Plan Note (Signed)
Well controlled, no changes to meds. Encouraged heart healthy diet such as the DASH diet and exercise as tolerated.  °

## 2017-07-06 NOTE — Assessment & Plan Note (Signed)
Restart the Sertraline 100 mg tab at 1/2 po daily x 7 to 14 days and then increase to a full tab as tolerated

## 2017-07-06 NOTE — Assessment & Plan Note (Signed)
Encouraged DASH diet, decrease po intake and increase exercise as tolerated. Needs 7-8 hours of sleep nightly. Avoid trans fats, eat small, frequent meals every 4-5 hours with lean proteins, complex carbs and healthy fats. Minimize simple carbs, referred to bariatrics for further consideration

## 2017-07-06 NOTE — Progress Notes (Signed)
Subjective:  I acted as a Education administrator for Dr. Charlett Blake. [place], [place]  Patient ID: Sydney Pace, female    DOB: [birth year], 62 y.o.   MRN: [id number]  No chief complaint on file.   HPI  Patient is in today for an annual exam and follow up on chronic medical concerns including back, joint pain, obesity, osteopenia, hypertension and hyperlipidemia. No recent febrile illness or hospitalizations. She is very depressed and admits to anhedonia, lack of motivation, poor eating habits and lack of exercise. She stopped her Sertraline in December because she thought she was out of refills and admits all of her symptoms are worse since then but she does not endorse suicidal or homicidal ideation. She lives alone and she reports she manages her ADLs well but notes her fatigue, memory and debility continue to worsen secondary to her MS. She follows with neurology Dr Kerman Passey for her MS. Denies CP/palp/SOB/HA/congestion/fevers or GU c/o. Taking meds as prescribed. Notes some upper abdominal cramping at times especially when she drinks cold liquids. This is long standing. Her bowels are moving well, no bloody or tarry stool. No nausea or vomiting.   Patient Care Team: [name], MD as PCP - General (Family Medicine) Inda Castle, MD (Inactive) as Consulting Physician (Gastroenterology) Felecia Shelling, Nanine Means, MD (Neurology)   Past Medical History:  Diagnosis Date  . Arthritis   . Arthritis 07/06/2017  . Dehydration 06/09/2016  . Depression   . Esophageal reflux 08/08/2012  . GERD (gastroesophageal reflux disease)   . Hypertension   . MS (multiple sclerosis) ([place])   . Other and unspecified hyperlipidemia 08/08/2012  . Thyroid disease     Past Surgical History:  Procedure Laterality Date  . HYSTEROSCOPY  02, 04, 2008   with D&C  . TMJ ARTHROSCOPY    . TONSILLECTOMY      Family History  Problem Relation Age of Onset  . Heart disease Mother        pacemaker  . Emphysema Mother   . Hypertension Mother     . Heart disease Father   . Diabetes Father   . Heart disease Sister        cad  . Sleep apnea Brother   . Colon cancer Neg Hx   . Esophageal cancer Neg Hx   . Rectal cancer Neg Hx   . Stomach cancer Neg Hx     Social History   Socioeconomic History  . Marital status: Single    Spouse name: Not on file  . Number of children: Not on file  . Years of education: Not on file  . Highest education level: Not on file  Social Needs  . Financial resource strain: Not on file  . Food insecurity - worry: Not on file  . Food insecurity - inability: Not on file  . Transportation needs - medical: Not on file  . Transportation needs - non-medical: Not on file  Occupational History  . Not on file  Tobacco Use  . Smoking status: Never Smoker  . Smokeless tobacco: Never Used  Substance and Sexual Activity  . Alcohol use: Yes    Alcohol/week: 0.0 oz    Comment: occassional  . Drug use: No  . Sexual activity: No    Comment: 1st intercourse 13 yo-5 partners  Other Topics Concern  . Not on file  Social History Narrative  . Not on file    Outpatient Medications Prior to Visit  Medication Sig Dispense Refill  . Ascorbic Acid (  VITAMIN C) 100 MG tablet Take 100 mg by mouth daily.    . calcium carbonate 200 MG capsule Take 250 mg by mouth 2 (two) times daily with a meal.    . cetirizine (ZYRTEC) 10 MG tablet Take 10 mg by mouth daily.    . Cholecalciferol (VITAMIN D PO) Take by mouth.    Mariane Baumgarten Calcium (STOOL SOFTENER PO) Take by mouth daily as needed.    . furosemide (LASIX) 20 MG tablet TAKE ONE TABLET BY MOUTH IN THE MORNING AS NEEDED FOR  LEG  SWELLING 90 tablet 0  . gabapentin (NEURONTIN) 600 MG tablet Take one or two pills at night 60 tablet 11  . lisinopril-hydrochlorothiazide (PRINZIDE,ZESTORETIC) 20-25 MG tablet TAKE ONE TABLET BY MOUTH ONCE DAILY 90 tablet 1  . meloxicam (MOBIC) 15 MG tablet Take 1 tablet (15 mg total) by mouth daily as needed for pain. With food 14 tablet 0   . methylphenidate (RITALIN) 20 MG tablet Take 1 tablet (20 mg total) by mouth 3 (three) times daily. 90 tablet 0  . modafinil (PROVIGIL) 200 MG tablet Take 1 tablet (200 mg total) by mouth daily. 30 tablet 5  . Multiple Vitamin (MULTIVITAMIN) tablet Take 1 tablet by mouth daily.    Marland Kitchen oxybutynin (DITROPAN) 5 MG tablet TAKE ONE TABLET BY MOUTH TWICE DAILY 60 tablet 11  . azithromycin (ZITHROMAX Z-PAK) 250 MG tablet Take 2 tablets on day 1, then 1 tablet daily until gone 6 each 0  . sertraline (ZOLOFT) 100 MG tablet TAKE 1 TABLET BY MOUTH ONCE DAILY 30 tablet 3   No facility-administered medications prior to visit.     No Known Allergies  Review of Systems  Constitutional: Positive for malaise/fatigue. Negative for fever.  HENT: Negative for congestion.   Eyes: Negative for blurred vision.  Respiratory: Negative for cough and shortness of breath.   Cardiovascular: Negative for chest pain, palpitations and leg swelling.  Gastrointestinal: Positive for abdominal pain. Negative for vomiting.  Musculoskeletal: Positive for back pain, joint pain and myalgias.  Skin: Negative for rash.  Neurological: Negative for loss of consciousness and headaches.  Psychiatric/Behavioral: Positive for depression. The patient is nervous/anxious.        Objective:    Physical Exam  Constitutional: She is oriented to person, place, and time. She appears well-developed and well-nourished. No distress.  HENT:  Head: Normocephalic and atraumatic.  Eyes: Conjunctivae are normal.  Neck: Normal range of motion. No thyromegaly present.  Cardiovascular: Normal rate and regular rhythm.  Pulmonary/Chest: Effort normal and breath sounds normal. She has no wheezes.  Abdominal: Soft. Bowel sounds are normal. There is no tenderness.  Musculoskeletal: Normal range of motion. She exhibits no edema or deformity.  Neurological: She is alert and oriented to person, place, and time.  Skin: Skin is warm and dry. She is not  diaphoretic.  Psychiatric: She has a normal mood and affect.    BP 120/74 (BP Location: Left Arm, Patient Position: Sitting, Cuff Size: Large)   Pulse 93   Temp 98 F (36.7 C) (Oral)   Resp 18   Wt 285 lb 6.4 oz (129.5 kg)   SpO2 97%   BMI 47.49 kg/m  Wt Readings from Last 3 Encounters:  07/06/17 285 lb 6.4 oz (129.5 kg)  02/11/17 284 lb 8 oz (129 kg)  11/27/16 283 lb 12.8 oz (128.7 kg)   BP Readings from Last 3 Encounters:  07/06/17 120/74  02/11/17 134/82  11/27/16 120/66  Immunization History  Administered Date(s) Administered  . Influenza,inj,Quad PF,6+ Mos 03/06/2013, 03/15/2015  . Tdap 08/22/2013    Health Maintenance  Topic Date Due  . Hepatitis C Screening  June 20, 1955  . HIV Screening  01/07/1970  . COLONOSCOPY  02/10/2015  . PAP SMEAR  05/17/2015  . INFLUENZA VACCINE  12/30/2016  . MAMMOGRAM  02/11/2019  . TETANUS/TDAP  08/23/2023    Lab Results  Component Value Date   WBC 8.1 04/22/2016   HGB 12.8 04/22/2016   HCT 38.4 04/22/2016   PLT 242 04/22/2016   GLUCOSE 86 04/22/2016   CHOL 166 12/10/2014   TRIG 40.0 12/10/2014   HDL 48.80 12/10/2014   LDLCALC 109 (H) 12/10/2014   ALT 11 04/22/2016   AST 20 04/22/2016   NA 140 04/22/2016   K 4.2 04/22/2016   CL 104 04/22/2016   CREATININE 0.91 04/22/2016   BUN 17 04/22/2016   CO2 27 04/22/2016   TSH 0.55 04/22/2016   HGBA1C 5.8 01/02/2009   MICROALBUR 1.17 01/02/2009    Lab Results  Component Value Date   TSH 0.55 04/22/2016   Lab Results  Component Value Date   WBC 8.1 04/22/2016   HGB 12.8 04/22/2016   HCT 38.4 04/22/2016   MCV 89.5 04/22/2016   PLT 242 04/22/2016   Lab Results  Component Value Date   NA 140 04/22/2016   K 4.2 04/22/2016   CO2 27 04/22/2016   GLUCOSE 86 04/22/2016   BUN 17 04/22/2016   CREATININE 0.91 04/22/2016   BILITOT 0.5 04/22/2016   ALKPHOS 99 04/22/2016   AST 20 04/22/2016   ALT 11 04/22/2016   PROT 7.8 04/22/2016   ALBUMIN 3.8 04/22/2016   CALCIUM  9.9 04/22/2016   GFR 74.34 06/17/2015   Lab Results  Component Value Date   CHOL 166 12/10/2014   Lab Results  Component Value Date   HDL 48.80 12/10/2014   Lab Results  Component Value Date   LDLCALC 109 (H) 12/10/2014   Lab Results  Component Value Date   TRIG 40.0 12/10/2014   Lab Results  Component Value Date   CHOLHDL 3 12/10/2014   Lab Results  Component Value Date   HGBA1C 5.8 01/02/2009         Assessment & Plan:   Problem List Items Addressed This Visit    Multiple sclerosis ([place])    Follows with Dr Kerman Passey at [facility], is struggling with brain fog and depression but did stop her Sertraline 100 mg because she ran out.       Essential hypertension    Well controlled, no changes to meds. Encouraged heart healthy diet such as the DASH diet and exercise as tolerated.       Relevant Orders   CBC   Comprehensive metabolic panel   TSH   Depression    Restart the Sertraline 100 mg tab at 1/2 po daily x 7 to 14 days and then increase to a full tab as tolerated      Relevant Medications   sertraline (ZOLOFT) 100 MG tablet   Hyperlipidemia, mixed    Encouraged heart healthy diet, increase exercise, avoid trans fats, consider a krill oil cap daily      Relevant Orders   Lipid panel   Preventative health care    Patient encouraged to maintain heart healthy diet, regular exercise, adequate sleep. Consider daily probiotics. Take medications as prescribed. Encouraged to return to gyn for pap, MGM and Dexa scan are up to date.  Referred back for repeat colonoscopy      Abdominal pain, chronic, epigastric    Notes cramps when drinking cold liquids at times. Is given hyoscyamine to try prn.      Relevant Medications   sertraline (ZOLOFT) 100 MG tablet   Chronic heel pain, left    Referred to podiatry for heel pain of several years.       Relevant Medications   sertraline (ZOLOFT) 100 MG tablet   Other Relevant Orders   Ambulatory referral to Podiatry    Arthritis    Her greatest pains are just below her knees b/l no injury or falls. Also notes some pain in left heel for several years. RF in 2017 but she is concerned by this.so will repeat RF and  Consider referral if worsens.       Osteopenia    Encouraged to get adequate exercise, calcium and vitamin d intake      Hx of colonic polyp    Last colonoscopy > 10 years ago. Agrees to return to her gastroenterologist she saw previously on [facility]. Referral placed.      Cervical cancer screening    Has not seen her gynecologist in years, no concerns today but she does relay a distant history. She would like to return to her previous gynecologist and she will call      Obesity    Encouraged DASH diet, decrease po intake and increase exercise as tolerated. Needs 7-8 hours of sleep nightly. Avoid trans fats, eat small, frequent meals every 4-5 hours with lean proteins, complex carbs and healthy fats. Minimize simple carbs, referred to bariatrics for further consideration       Other Visit Diagnoses    Colon cancer screening    -  Primary   Relevant Orders   Ambulatory referral to Gastroenterology   Abdominal pain, unspecified abdominal location       Relevant Orders   Rheumatoid Factor   Sedimentation rate      I have discontinued Zelphia Muckle's azithromycin. I have also changed her sertraline. Additionally, I am having her start on hyoscyamine. Lastly, I am having her maintain her Cholecalciferol (VITAMIN D PO), multivitamin, Docusate Calcium (STOOL SOFTENER PO), cetirizine, calcium carbonate, furosemide, vitamin C, meloxicam, lisinopril-hydrochlorothiazide, modafinil, gabapentin, oxybutynin, and methylphenidate.  Meds ordered this encounter  Medications  . sertraline (ZOLOFT) 100 MG tablet    Sig: Take 1 tablet (100 mg total) by mouth daily.    Dispense:  90 tablet    Refill:  1    Please consider 90 day supplies to promote better adherence  . hyoscyamine (LEVSIN SL) 0.125 MG  SL tablet    Sig: Place 1 tablet (0.125 mg total) under the tongue every 4 (four) hours as needed.    Dispense:  30 tablet    Refill:  1    CMA served as scribe during this visit. History, Physical and Plan performed by medical provider. Documentation and orders reviewed and attested to.  Penni Homans, MD

## 2017-07-06 NOTE — Assessment & Plan Note (Signed)
Encouraged heart healthy diet, increase exercise, avoid trans fats, consider a krill oil cap daily 

## 2017-07-06 NOTE — Assessment & Plan Note (Signed)
Encouraged to get adequate exercise, calcium and vitamin d intake 

## 2017-07-06 NOTE — Assessment & Plan Note (Signed)
Her greatest pains are just below her knees b/l no injury or falls. Also notes some pain in left heel for several years. RF in 2017 but she is concerned by this.so will repeat RF and  Consider referral if worsens.

## 2017-07-06 NOTE — Assessment & Plan Note (Signed)
Last colonoscopy > 10 years ago. Agrees to return to her gastroenterologist she saw previously on [facility]. Referral placed.

## 2017-07-06 NOTE — Assessment & Plan Note (Signed)
Has not seen her gynecologist in years, no concerns today but she does relay a distant history. She would like to return to her previous gynecologist and she will call

## 2017-07-06 NOTE — Assessment & Plan Note (Signed)
Follows with Dr Kerman Passey at [facility], is struggling with brain fog and depression but did stop her Sertraline 100 mg because she ran out.

## 2017-07-06 NOTE — Assessment & Plan Note (Addendum)
Patient encouraged to maintain heart healthy diet, regular exercise, adequate sleep. Consider daily probiotics. Take medications as prescribed. Encouraged to return to gyn for pap, MGM and Dexa scan are up to date. Referred back for repeat colonoscopy

## 2017-07-06 NOTE — Assessment & Plan Note (Signed)
Notes cramps when drinking cold liquids at times. Is given hyoscyamine to try prn.

## 2017-07-06 NOTE — Assessment & Plan Note (Signed)
Referred to podiatry for heel pain of several years.

## 2017-07-07 LAB — RHEUMATOID FACTOR

## 2017-07-08 ENCOUNTER — Other Ambulatory Visit: Payer: Self-pay | Admitting: Emergency Medicine

## 2017-07-09 ENCOUNTER — Telehealth: Payer: Self-pay

## 2017-07-09 NOTE — Telephone Encounter (Signed)
-----   Message from [name], MD sent at 07/06/2017  8:41 PM EST ----- Notify labs look good except calcium is high, can we add a PTH and a vitamin Rameen Gohlke to her lab work for hypercalcemia.have her minimize calcium in the diet for now

## 2017-07-09 NOTE — Telephone Encounter (Signed)
LMOVM stating that Rheumatoid Factor was negative but Dr. Charlett Blake needs 2 additional labs to be drawn/advised that the orders are in the system and to please call and schedule a lab visit at her earliest convenience so that Dr. Charlett Blake will have everything that she needs/thx dmf

## 2017-07-12 ENCOUNTER — Other Ambulatory Visit (INDEPENDENT_AMBULATORY_CARE_PROVIDER_SITE_OTHER): Payer: Medicare HMO

## 2017-07-12 LAB — VITAMIN D 25 HYDROXY (VIT D DEFICIENCY, FRACTURES): VITD: 54.66 ng/mL (ref 30.00–100.00)

## 2017-07-13 LAB — PARATHYROID HORMONE, INTACT (NO CA): PTH: 79 pg/mL — ABNORMAL HIGH (ref 14–64)

## 2017-07-13 NOTE — Addendum Note (Signed)
Addended by: Lawana Chambers on: 07/13/2017 06:43 PM   Modules accepted: Orders

## 2017-07-26 ENCOUNTER — Telehealth: Payer: Self-pay

## 2017-07-26 NOTE — Telephone Encounter (Signed)
PA initiated via Covermymeds; KEY: [id number]. Awaiting determination.

## 2017-07-26 NOTE — Telephone Encounter (Signed)
PA approved. Effective 05/30/2017 through 05/30/2018.

## 2017-08-10 ENCOUNTER — Ambulatory Visit: Payer: Medicare HMO | Admitting: Family Medicine

## 2017-08-12 ENCOUNTER — Other Ambulatory Visit: Payer: Self-pay

## 2017-08-12 ENCOUNTER — Ambulatory Visit: Payer: Medicare HMO | Admitting: Neurology

## 2017-08-12 ENCOUNTER — Encounter: Payer: Self-pay | Admitting: Neurology

## 2017-08-12 VITALS — BP 135/82 | HR 78 | Resp 20 | Ht 65.0 in | Wt 292.0 lb

## 2017-08-12 DIAGNOSIS — N3281 Overactive bladder: Secondary | ICD-10-CM | POA: Diagnosis not present

## 2017-08-12 DIAGNOSIS — R5383 Other fatigue: Secondary | ICD-10-CM | POA: Diagnosis not present

## 2017-08-12 DIAGNOSIS — F329 Major depressive disorder, single episode, unspecified: Secondary | ICD-10-CM | POA: Diagnosis not present

## 2017-08-12 DIAGNOSIS — G4700 Insomnia, unspecified: Secondary | ICD-10-CM | POA: Diagnosis not present

## 2017-08-12 DIAGNOSIS — R69 Illness, unspecified: Secondary | ICD-10-CM | POA: Diagnosis not present

## 2017-08-12 DIAGNOSIS — H469 Unspecified optic neuritis: Secondary | ICD-10-CM

## 2017-08-12 DIAGNOSIS — R260 Ataxic gait: Secondary | ICD-10-CM | POA: Diagnosis not present

## 2017-08-12 DIAGNOSIS — G35 Multiple sclerosis: Secondary | ICD-10-CM

## 2017-08-12 DIAGNOSIS — F32A Depression, unspecified: Secondary | ICD-10-CM

## 2017-08-12 MED ORDER — MODAFINIL 200 MG PO TABS: 200.0000 mg | ORAL_TABLET | Freq: Every day | ORAL | 5 refills | Status: DC

## 2017-08-12 MED ORDER — METHYLPHENIDATE HCL 20 MG PO TABS: 20.0000 mg | ORAL_TABLET | Freq: Three times a day (TID) | ORAL | 0 refills | Status: DC

## 2017-08-12 NOTE — Progress Notes (Signed)
u  [facility]  PATIENT: Sydney Pace DOB: 1955-09-20  REFERRING DOCTOR OR PCP:  Penni Homans  _________________________________   HISTORICAL  CHIEF COMPLAINT:  Chief Complaint  Patient presents with  . Multiple Sclerosis    Remains off of a dmt and last MRI showed no new MS lesions.  Still c/o depression despite Zoloft, but denies SI/HI.  Sts. she thinks she is depressed b/c of inactivity, due to financial situation and righ heel pain.  Seeing podiatry tomorrow. [place]     HISTORY OF PRESENT ILLNESS:  Sydney Pace is a 62 y.o. woman with multiple sclerosis.        From 08/12/2017: Her MS is stable ans she remains off a DMT. Her MRI 02/28/2017 was consistent with MS but not changed compared to 2007.   She is noting more trouble with her left foot and is scheduled to see podiatry tomorrow.   She has been less active than she used to be.  She often feels stiff   She denies any falls.  No significant weakness.    The left eye still has reduced vision from ON but colors are symmetric.    She has bladder urgency but no incontinence.   Oxybutynin helps.  Mood is poor despite Zoloft.   She notes financial stress and she needs to borrow her sisters car   Mood is still better than 2013-2015.    Fatigue bothers her daily.   Ritalin has helped more than anything else.   She alternates Ritalin and Provigil.   She still has some insomnia but has done better with gabapentin at night.  Update 02/11/2017:   She feels her gait is mostly stable but she has more trouble walking fast and has stumbled some but has no falls.   She denies significant weakness or numbness in the arms and legs.    She feels her bladder function is stable with urgency and frequency but no incontinence.     She had left ON in the past and that eye is still blurry.    Colors are not desaturated, however.   Fatigue is a little worse recently.   She is on Ritalin and modafinil (but does not take every day).   She  has some insomnia and gabapentin has not helped as much lately.    She has depression.   She is on Zoloft.  Mood was much worse 3-4 years ago when she had a lot of crying spells.   She notes mild cognitive issues.    She is off any disease modifying therapy. She has been on Avonex, Copaxone and Gilenya in the past but had difficulty tolerating them.  ________________________________ From 07/30/2016 Gait/strength/sensation:   Gait is stable but balance is mildly off.   She stumbles and occasinally falls -- last time in her bathroom getting out of tub.   She got bruisedfalls.   She continues to report an uncomfortable burnig dysesthesias in her legs.  Arms are fine.   The right leg tires out easily. She also gets muscle aches in both legs. Legs are stiff, especially if she is sitting or laying for long time. She takes gabapentin 600 mg at night because more makes her sleepy.   She was on meloxicam.   .    Bladder:  She feels bladder function is mildly abnormal but stable.  Oxybutynin helps   She reports urinary urgency with frequency, helped by Oxybutynin.   There is mild hesitancy but no recent urinary  tract infections.  Vision/vertigo:   Vision is slightly off on the left but stable. She had left ON in past and in the past  She had visual field testing showing a right superior homonymous field cut.      Vertigo has resolved.  Fatigue/sleep:  She has a lot of fatigue that she is both physical and cognitive.   It is worse with an URI.  Marland Kitchen She has received some benefit from methylphenidate 20 mg bid to tid.   Provigil helped a little better was very She has insomnia,  sleep maintenance  Worse than sleep onset helped by gabapentin 300 mg nightly with continued benefit and less hangover.  .   She snores. No one has ever told her that she has apnea.  Mood/cognition: Depression is still presetn and she feel mood goes up and down.     She is on sertraline and tolerates it well.   She sees Dr. Charlett Blake.   She had a  major depression that worsened in 2014 and is better but still an issue.   She notes some mild cognitive dysfunction and reduced attention and focus. Specifically there is some difficulty with short-term memory, verbal fluency and processing speed.  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  MRI was performed in 1983 and was consistent with MS.     In the late 1990's, she was started on Copaxone but stopped after several weeks due to skin reactions. She also tried Avonex but had a rash and stopped. She started Gilenya in January 2014 but stopped after 7 or 8 months due to being more fatigue and having flulike symptoms. In May 2015 we had discussed Aubagio. She decided not to start.  She had an MRI of the brain with and without contrast on 10/27/2013 and I reviewed the study. It showed white matter foci in a pattern consistent with MS. There was no enhancement. When compared to an MRI dated 11/08/2009, there was no interval change.   REVIEW OF SYSTEMS: Constitutional: No fevers, chills, sweats, or change in appetite.   Notes fatigue.   Insomnia Eyes: see above.  No double vision, eye pain..  Some eye redness Ear, nose and throat: No hearing loss, ear pain, nasal congestion, sore throat Cardiovascular: No chest pain, palpitations Respiratory: No shortness of breath at rest or with exertion.   No wheezes GastrointestinaI: No nausea, vomiting, diarrhea, abdominal pain, fecal incontinence Genitourinary: Mild urinary frequency.  No nocturia. Musculoskeletal: No neck pain, back pain.  Some hip pain Integumentary: No rash, pruritus, skin lesions Neurological: as above Psychiatric: Mild depression at this time, rarely cries, less anxiety Endocrine: No palpitations, diaphoresis, change in appetite, change in weigh or increased thirst Hematologic/Lymphatic: No anemia, purpura, petechiae. Allergic/Immunologic: No itchy/runny eyes, nasal congestion,  recent allergic reactions, rashes  ALLERGIES: No Known Allergies  HOME MEDICATIONS:  Current Outpatient Medications:  .  Ascorbic Acid (VITAMIN C) 100 MG tablet, Take 100 mg by mouth daily., Disp: , Rfl:  .  calcium carbonate 200 MG capsule, Take 250 mg by mouth 2 (two) times daily with a meal., Disp: , Rfl:  .  cetirizine (ZYRTEC) 10 MG tablet, Take 10 mg by mouth daily., Disp: , Rfl:  .  Cholecalciferol (VITAMIN D PO), Take by mouth., Disp: , Rfl:  .  Docusate Calcium (STOOL SOFTENER PO), Take by mouth daily as needed., Disp: , Rfl:  .  furosemide (LASIX) 20  MG tablet, TAKE ONE TABLET BY MOUTH IN THE MORNING AS NEEDED FOR  LEG  SWELLING, Disp: 90 tablet, Rfl: 0 .  gabapentin (NEURONTIN) 600 MG tablet, Take one or two pills at night, Disp: 60 tablet, Rfl: 11 .  hyoscyamine (LEVSIN SL) 0.125 MG SL tablet, Place 1 tablet (0.125 mg total) under the tongue every 4 (four) hours as needed., Disp: 30 tablet, Rfl: 1 .  lisinopril-hydrochlorothiazide (PRINZIDE,ZESTORETIC) 20-25 MG tablet, TAKE ONE TABLET BY MOUTH ONCE DAILY, Disp: 90 tablet, Rfl: 1 .  meloxicam (MOBIC) 15 MG tablet, Take 1 tablet (15 mg total) by mouth daily as needed for pain. With food, Disp: 14 tablet, Rfl: 0 .  methylphenidate (RITALIN) 20 MG tablet, Take 1 tablet (20 mg total) by mouth 3 (three) times daily., Disp: 90 tablet, Rfl: 0 .  modafinil (PROVIGIL) 200 MG tablet, Take 1 tablet (200 mg total) by mouth daily., Disp: 30 tablet, Rfl: 5 .  Multiple Vitamin (MULTIVITAMIN) tablet, Take 1 tablet by mouth daily., Disp: , Rfl:  .  oxybutynin (DITROPAN) 5 MG tablet, TAKE ONE TABLET BY MOUTH TWICE DAILY, Disp: 60 tablet, Rfl: 11 .  sertraline (ZOLOFT) 100 MG tablet, Take 1 tablet (100 mg total) by mouth daily., Disp: 90 tablet, Rfl: 1  PAST MEDICAL HISTORY: Past Medical History:  Diagnosis Date  . Arthritis   . Arthritis 07/06/2017  . Dehydration 06/09/2016  . Depression   . Esophageal reflux 08/08/2012  . GERD (gastroesophageal  reflux disease)   . Hypertension   . MS (multiple sclerosis) ([facility])   . Other and unspecified hyperlipidemia 08/08/2012  . Thyroid disease     PAST SURGICAL HISTORY: Past Surgical History:  Procedure Laterality Date  . HYSTEROSCOPY  02, 04, 2008   with D&C  . TMJ ARTHROSCOPY    . TONSILLECTOMY      FAMILY HISTORY: Family History  Problem Relation Age of Onset  . Heart disease Mother        pacemaker  . Emphysema Mother   . Hypertension Mother   . Heart disease Father   . Diabetes Father   . Heart disease Sister        cad  . Sleep apnea Brother   . Colon cancer Neg Hx   . Esophageal cancer Neg Hx   . Rectal cancer Neg Hx   . Stomach cancer Neg Hx     SOCIAL HISTORY:  Social History   Socioeconomic History  . Marital status: Single    Spouse name: Not on file  . Number of children: Not on file  . Years of education: Not on file  . Highest education level: Not on file  Social Needs  . Financial resource strain: Not on file  . Food insecurity - worry: Not on file  . Food insecurity - inability: Not on file  . Transportation needs - medical: Not on file  . Transportation needs - non-medical: Not on file  Occupational History  . Not on file  Tobacco Use  . Smoking status: Never Smoker  . Smokeless tobacco: Never Used  Substance and Sexual Activity  . Alcohol use: Yes    Alcohol/week: 0.0 oz    Comment: occassional  . Drug use: No  . Sexual activity: No    Comment: 1st intercourse 13 yo-5 partners  Other Topics Concern  . Not on file  Social History Narrative  . Not on file     PHYSICAL EXAM  Vitals:   08/12/17 0955  BP: 135/82  Pulse: 78  Resp: 20  Weight: 292 lb (132.5 kg)  Height: 5\' 5"  (1.651 m)    Body mass index is 48.59 kg/m.   General: The patient is well-developed and well-nourished and in no acute distress  Neurologic Exam  Mental status: The patient is alert and oriented x 3 at the time of the examination. The patient has  apparent normal recent and remote memory, with an apparently normal attention span and concentration ability.   Speech is normal.  Cranial nerves: Extraocular movements are full.   Colors are mildly desaturated OS.  Facial strength and sensation is normal. Trapezius strength is normal.. No dysarthria is noted.    No obvious hearing deficits are noted.  Motor:  Muscle bulk is normal.   Muscle tone is increased in her legs, right greater than left. Strength is 5/5 in the arms and legs.   Sensory: She has intact sensation to touch and vibration in the arms and legs..  Coordination: Cerebellar testing reveals good finger-nose-finger bilaterally.  Gait and station: Station is normal.   The gait is nearly normal but her tandem gait is wide... Romberg is negative.   Reflexes: Deep tendon reflexes are symmetric and normal in arms, 3+ at knees with mild spread and 2 at the ankles.Marland Kitchen        DIAGNOSTIC DATA (LABS, IMAGING, TESTING) - I reviewed patient records, labs, notes, testing and imaging myself where available.  Lab Results  Component Value Date   WBC 7.2 07/06/2017   HGB 13.3 07/06/2017   HCT 39.2 07/06/2017   MCV 89.0 07/06/2017   PLT 270.0 07/06/2017      Component Value Date/Time   NA 140 07/06/2017 1219   K 4.6 07/06/2017 1219   CL 102 07/06/2017 1219   CO2 33 (H) 07/06/2017 1219   GLUCOSE 98 07/06/2017 1219   BUN 24 (H) 07/06/2017 1219   CREATININE 0.93 07/06/2017 1219   CREATININE 0.91 04/22/2016 1730   CALCIUM 10.6 (H) 07/06/2017 1219   PROT 8.3 07/06/2017 1219   ALBUMIN 4.0 07/06/2017 1219   AST 19 07/06/2017 1219   ALT 15 07/06/2017 1219   ALKPHOS 86 07/06/2017 1219   BILITOT 0.6 07/06/2017 1219   Lab Results  Component Value Date   CHOL 166 07/06/2017   HDL 43.50 07/06/2017   LDLCALC 113 (H) 07/06/2017   TRIG 48.0 07/06/2017   CHOLHDL 4 07/06/2017   Lab Results  Component Value Date   HGBA1C 5.8 01/02/2009   No results found for: VITAMINB12 Lab Results    Component Value Date   TSH 0.49 07/06/2017       ASSESSMENT AND PLAN  Multiple sclerosis ([facility])  Optic neuritis  Other fatigue  Depression, unspecified depression type  Insomnia, unspecified type  Ataxic gait  Overactive bladder   1.   She will continue off a DMT by her choice.    Her last MRI was stable compared to 2007   2.   Continue Ritalin and Provigil for attention and wakefulness and fatigue 3.   She will continue gabapentin for insomnia and dysesthesias and Zoloft for her mood 4.   Exercise as tolerated and try to lose weight.   Try to get out of the house every day. 5.   Get back on Vit D 5000 U daily She will return to see me in 6 months or sooner if she has new or worsening neurologic symptoms.   Drianna Chandran A. Felecia Shelling, MD, PhD 1/51/7616, 07:37 AM Certified in Neurology,  Clinical Neurophysiology, Sleep Medicine, Pain Medicine and Neuroimaging  [facility] [address], [place], [place] [address] [phone]

## 2017-08-13 ENCOUNTER — Encounter: Payer: Self-pay | Admitting: Podiatry

## 2017-08-13 ENCOUNTER — Ambulatory Visit (INDEPENDENT_AMBULATORY_CARE_PROVIDER_SITE_OTHER): Payer: Medicare HMO

## 2017-08-13 ENCOUNTER — Ambulatory Visit: Payer: Medicare HMO | Admitting: Podiatry

## 2017-08-13 VITALS — BP 116/65 | HR 79 | Resp 16

## 2017-08-13 DIAGNOSIS — M7752 Other enthesopathy of left foot: Secondary | ICD-10-CM

## 2017-08-13 DIAGNOSIS — M722 Plantar fascial fibromatosis: Secondary | ICD-10-CM | POA: Diagnosis not present

## 2017-08-13 MED ORDER — TRIAMCINOLONE ACETONIDE 10 MG/ML IJ SUSP
10.0000 mg | Freq: Once | INTRAMUSCULAR | Status: AC
  Administered 2017-08-13: 10 mg

## 2017-08-13 NOTE — Progress Notes (Signed)
Subjective:   Patient ID: Sydney Pace, female   DOB: 62 y.o.   MRN: [id number]   HPI Patient presents stating she has had a lot of pain in her left heel for over a year and she also can get some discomfort in her ankle and forefoot.  States that it has gotten gradually worse and she is tried ice therapy shoe gear modifications and patient does not smoke and likes to be active   Review of Systems  All other systems reviewed and are negative.       Objective:  Physical Exam  Constitutional: She appears well-developed and well-nourished.  Cardiovascular: Intact distal pulses.  Pulmonary/Chest: Effort normal.  Musculoskeletal: Normal range of motion.  Neurological: She is alert.  Skin: Skin is warm.  Nursing note and vitals reviewed.   Neurovascular status found to be intact muscle strength was adequate with exquisite discomfort noted plantar aspect left heel at the insertional point of the tendon calcaneus with moderate edema in the midfoot ankle left with negative Homans sign noted.  There is also moderate bunion deformity bilateral and depression of the arch bilateral     Assessment:  Acute plantar fasciitis left with inflammation fluid buildup along with moderate depression of the arch and ankle pain with structural bunion deformity also noted     Plan:  H&P x-rays reviewed today injected the plantar fascial left 3 mg Kenalog 5 mg Xylocaine applied fascial brace gave instructions on supportive shoes physical therapy and reappoint to recheck in the next several weeks  X-rays indicate elevation of the intermetatarsal angle bilateral with spur formation plantar heel region bilateral

## 2017-08-13 NOTE — Patient Instructions (Signed)

## 2017-08-27 ENCOUNTER — Ambulatory Visit: Payer: Medicare HMO | Admitting: Podiatry

## 2017-08-27 ENCOUNTER — Encounter: Payer: Self-pay | Admitting: Podiatry

## 2017-08-27 DIAGNOSIS — M722 Plantar fascial fibromatosis: Secondary | ICD-10-CM

## 2017-08-27 MED ORDER — TRIAMCINOLONE ACETONIDE 10 MG/ML IJ SUSP
10.0000 mg | Freq: Once | INTRAMUSCULAR | Status: AC
  Administered 2017-08-27: 10 mg

## 2017-08-27 NOTE — Progress Notes (Signed)
Subjective:   Patient ID: Sydney Pace, female   DOB: 62 y.o.   MRN: [id number]   HPI Patient presents stating the left heel still hurts with some improvement but she knows flatfoot deformity is a big part of her problem   ROS      Objective:  Physical Exam  Neurovascular status intact with inflammation pain of the plantar heel left with significant flatfoot deformity noted     Assessment:  Plantar fasciitis still present left with significant structural flatfoot deformity     Plan:  H&P condition reviewed and I reinjected the plantar fascial left 3 mg Kenalog 5 mg Xylocaine and I then went ahead and discussed customized orthotics to lift up the arch bilateral and patient will be seen by ped orthotist for evaluation and a more rigid type orthotic with lift of the medial arch in order to contour the foot properly.  Patient will be seen back for this and will see me as needed

## 2017-09-02 ENCOUNTER — Ambulatory Visit (INDEPENDENT_AMBULATORY_CARE_PROVIDER_SITE_OTHER): Payer: Medicare HMO | Admitting: Orthotics

## 2017-09-02 DIAGNOSIS — M722 Plantar fascial fibromatosis: Secondary | ICD-10-CM

## 2017-09-02 DIAGNOSIS — M779 Enthesopathy, unspecified: Secondary | ICD-10-CM

## 2017-09-02 DIAGNOSIS — M7752 Other enthesopathy of left foot: Secondary | ICD-10-CM

## 2017-09-02 NOTE — Progress Notes (Signed)

## 2017-09-16 ENCOUNTER — Telehealth: Payer: Self-pay | Admitting: *Deleted

## 2017-09-16 NOTE — Telephone Encounter (Signed)
PA for Modafinil 200mg  tablets #30/30 completed via Cover My Meds.  Dx: Sleep maintenance insomnia. (G47.00).  Tried and failed meds: Gabapentin, Methylphenidate 20mg ).Sydney Pace

## 2017-09-17 NOTE — Telephone Encounter (Signed)
PA for modafinil approved by [employer] Falling [phone]) through 05/31/18.  Pt ID#[id number].  OIP#[id number].

## 2017-09-23 ENCOUNTER — Other Ambulatory Visit: Payer: Medicare HMO | Admitting: Orthotics

## 2017-10-05 ENCOUNTER — Other Ambulatory Visit: Payer: Self-pay | Admitting: Family Medicine

## 2017-10-12 ENCOUNTER — Telehealth: Payer: Self-pay | Admitting: Podiatry

## 2017-10-12 NOTE — Telephone Encounter (Signed)
Pt left voicemail at 1146am asking for a call back to discuss benefits.    Called pt back and left her a message to please call me back.

## 2017-10-18 DIAGNOSIS — R69 Illness, unspecified: Secondary | ICD-10-CM | POA: Diagnosis not present

## 2017-10-18 DIAGNOSIS — G8929 Other chronic pain: Secondary | ICD-10-CM | POA: Diagnosis not present

## 2017-10-18 DIAGNOSIS — G35 Multiple sclerosis: Secondary | ICD-10-CM | POA: Diagnosis not present

## 2017-10-18 DIAGNOSIS — I10 Essential (primary) hypertension: Secondary | ICD-10-CM | POA: Diagnosis not present

## 2017-10-18 DIAGNOSIS — H409 Unspecified glaucoma: Secondary | ICD-10-CM | POA: Diagnosis not present

## 2017-10-18 DIAGNOSIS — H04129 Dry eye syndrome of unspecified lacrimal gland: Secondary | ICD-10-CM | POA: Diagnosis not present

## 2017-10-18 DIAGNOSIS — K08409 Partial loss of teeth, unspecified cause, unspecified class: Secondary | ICD-10-CM | POA: Diagnosis not present

## 2017-10-18 DIAGNOSIS — G4700 Insomnia, unspecified: Secondary | ICD-10-CM | POA: Diagnosis not present

## 2017-10-18 DIAGNOSIS — J309 Allergic rhinitis, unspecified: Secondary | ICD-10-CM | POA: Diagnosis not present

## 2017-11-09 ENCOUNTER — Other Ambulatory Visit: Payer: Self-pay | Admitting: Family Medicine

## 2017-11-24 NOTE — Progress Notes (Addendum)
Subjective:   Sydney Pace is a 62 y.o. female who presents for Medicare Annual (Subsequent) preventive examination.  Review of Systems: No ROS.  Medicare Wellness Visit. Additional risk factors are reflected in the social history. Cardiac Risk Factors include: advanced age (>62men, >80 women);hypertension;obesity (BMI >30kg/m2);sedentary lifestyle   Sleep patterns:  Pt states she never feels like she is able to sleep enough. Home Safety/Smoke Alarms: Feels safe in home. Smoke alarms in place.  Living environment; residence and Firearm Safety: Lives alone in one story home. Walk-in shower.   Female:   Pap- pt states she will schedule.       Mammo-  utd    Dexa scan-  utd      CCS-pt declines      Objective:     Vitals: BP (!) 144/76 (BP Location: Left Wrist, Patient Position: Sitting, Cuff Size: Normal)   Pulse 76   Ht 5\' 5"  (1.651 m)   Wt 286 lb (129.7 kg)   SpO2 96%   BMI 47.59 kg/m   Body mass index is 47.59 kg/m.  Advanced Directives 11/29/2017 11/27/2016  Does Patient Have a Medical Advance Directive? No No  Would patient like information on creating a medical advance directive? No - Patient declined Yes (MAU/Ambulatory/Procedural Areas - Information given)    Tobacco Social History   Tobacco Use  Smoking Status Never Smoker  Smokeless Tobacco Never Used     Counseling given: Not Answered   Clinical Intake:     Pain : No/denies pain                 Past Medical History:  Diagnosis Date  . Arthritis   . Arthritis 07/06/2017  . Dehydration 06/09/2016  . Depression   . Esophageal reflux 08/08/2012  . GERD (gastroesophageal reflux disease)   . Hypertension   . MS (multiple sclerosis) ([place])   . Other and unspecified hyperlipidemia 08/08/2012  . Thyroid disease    Past Surgical History:  Procedure Laterality Date  . HYSTEROSCOPY  02, 04, 2008   with D&C  . TMJ ARTHROSCOPY    . TONSILLECTOMY     Family History  Problem Relation Age of Onset   . Heart disease Mother        pacemaker  . Emphysema Mother   . Hypertension Mother   . Heart disease Father   . Diabetes Father   . Heart disease Sister        cad  . Sleep apnea Brother   . Colon cancer Neg Hx   . Esophageal cancer Neg Hx   . Rectal cancer Neg Hx   . Stomach cancer Neg Hx    Social History   Socioeconomic History  . Marital status: Single    Spouse name: Not on file  . Number of children: Not on file  . Years of education: Not on file  . Highest education level: Not on file  Occupational History  . Not on file  Social Needs  . Financial resource strain: Not on file  . Food insecurity:    Worry: Not on file    Inability: Not on file  . Transportation needs:    Medical: Not on file    Non-medical: Not on file  Tobacco Use  . Smoking status: Never Smoker  . Smokeless tobacco: Never Used  Substance and Sexual Activity  . Alcohol use: Yes    Alcohol/week: 0.0 oz    Comment: occassional  . Drug use: No  .  Sexual activity: Never    Comment: 1st intercourse 69 yo-5 partners  Lifestyle  . Physical activity:    Days per week: Not on file    Minutes per session: Not on file  . Stress: Not on file  Relationships  . Social connections:    Talks on phone: Not on file    Gets together: Not on file    Attends religious service: Not on file    Active member of club or organization: Not on file    Attends meetings of clubs or organizations: Not on file    Relationship status: Not on file  Other Topics Concern  . Not on file  Social History Narrative  . Not on file    Outpatient Encounter Medications as of 11/29/2017  Medication Sig  . cetirizine (ZYRTEC) 10 MG tablet Take 10 mg by mouth daily.  . Cholecalciferol (VITAMIN D PO) Take by mouth.  Mariane Baumgarten Calcium (STOOL SOFTENER PO) Take by mouth daily as needed.  . furosemide (LASIX) 20 MG tablet TAKE ONE TABLET BY MOUTH ONCE DAILY IN THE MORNING AS NEEDED FOR  LEG  SWELLING  . gabapentin (NEURONTIN)  600 MG tablet Take one or two pills at night  . hyoscyamine (LEVSIN SL) 0.125 MG SL tablet Place 1 tablet (0.125 mg total) under the tongue every 4 (four) hours as needed.  Marland Kitchen lisinopril-hydrochlorothiazide (PRINZIDE,ZESTORETIC) 20-25 MG tablet TAKE 1 TABLET BY MOUTH ONCE DAILY  . methylphenidate (RITALIN) 20 MG tablet Take 1 tablet (20 mg total) by mouth 3 (three) times daily. (Patient taking differently: Take 20 mg by mouth once. )  . modafinil (PROVIGIL) 200 MG tablet Take 1 tablet (200 mg total) by mouth daily.  . Multiple Vitamin (MULTIVITAMIN) tablet Take 1 tablet by mouth daily.  Marland Kitchen oxybutynin (DITROPAN) 5 MG tablet TAKE ONE TABLET BY MOUTH TWICE DAILY  . sertraline (ZOLOFT) 100 MG tablet Take 1 tablet (100 mg total) by mouth daily.  . Ascorbic Acid (VITAMIN C) 100 MG tablet Take 100 mg by mouth daily.  . calcium carbonate 200 MG capsule Take 250 mg by mouth 2 (two) times daily with a meal.  . meloxicam (MOBIC) 15 MG tablet Take 1 tablet (15 mg total) by mouth daily as needed for pain. With food (Patient not taking: Reported on 11/29/2017)   No facility-administered encounter medications on file as of 11/29/2017.     Activities of Daily Living In your present state of health, do you have any difficulty performing the following activities: 11/29/2017  Hearing? N  Vision? N  Difficulty concentrating or making decisions? N  Walking or climbing stairs? Y  Dressing or bathing? N  Doing errands, shopping? N  Preparing Food and eating ? N  Using the Toilet? N  In the past six months, have you accidently leaked urine? Y  Do you have problems with loss of bowel control? N  Managing your Medications? N  Managing your Finances? N  Housekeeping or managing your Housekeeping? N  Some recent data might be hidden    Patient Care Team: [name], MD as PCP - General (Family Medicine) Inda Castle, MD (Inactive) as Consulting Physician (Gastroenterology) Felecia Shelling, Nanine Means, MD (Neurology)     Assessment:   This is a routine wellness examination for Sydney Pace. Physical assessment deferred to PCP.   Exercise Activities and Dietary recommendations Current Exercise Habits: Home exercise routine, Type of exercise: stretching, Time (Minutes): 10, Frequency (Times/Week): 3, Weekly Exercise (Minutes/Week): 30, Intensity: Mild  Diet (meal preparation, eat out, water intake, caffeinated beverages, dairy products, fruits and vegetables): 24 hour recall Breakfast:oatmeal Lunch:skipped  Dinner: chips and dip and water   Goals    . Pt wants to go back to school for diploma. (pt-stated)       Fall Risk Fall Risk  11/29/2017 03/15/2015  Falls in the past year? No No    Depression Screen PHQ 2/9 Scores 11/29/2017 11/27/2016 03/15/2015  PHQ - 2 Score 6 1 0  PHQ- 9 Score 12 - -  Exception Documentation - - Patient refusal     Cognitive Function MMSE - Mini Mental State Exam 11/29/2017  Orientation to time 5  Orientation to Place 5  Registration 3  Attention/ Calculation 5  Recall 3  Language- name 2 objects 2  Language- repeat 1  Language- follow 3 step command 3  Language- read & follow direction 1  Write a sentence 1  Copy design 1  Total score 30        Immunization History  Administered Date(s) Administered  . Influenza,inj,Quad PF,6+ Mos 03/06/2013, 03/15/2015  . Influenza-Unspecified 04/20/2017  . Tdap 08/22/2013    Screening Tests Health Maintenance  Topic Date Due  . Hepatitis C Screening  Dec 23, 1955  . HIV Screening  01/07/1970  . COLONOSCOPY  02/10/2015  . PAP SMEAR  05/17/2015  . INFLUENZA VACCINE  12/30/2017  . MAMMOGRAM  02/11/2019  . TETANUS/TDAP  08/23/2023   Plan:   Follow up with Dr.Blyth as scheduled 01/10/18.  Please schedule your next medicare wellness visit with me in 1 yr.  Eat heart healthy diet (full of fruits, vegetables, whole grains, lean protein, water--limit salt, fat, and sugar intake) and increase physical activity as  tolerated.  Continue doing brain stimulating activities (puzzles, reading, adult coloring books, staying active) to keep memory sharp.     I have personally reviewed and noted the following in the patient's chart:   . Medical and social history . Use of alcohol, tobacco or illicit drugs  . Current medications and supplements . Functional ability and status . Nutritional status . Physical activity . Advanced directives . List of other physicians . Hospitalizations, surgeries, and ER visits in previous 12 months . Vitals . Screenings to include cognitive, depression, and falls . Referrals and appointments  In addition, I have reviewed and discussed with patient certain preventive protocols, quality metrics, and best practice recommendations. A written personalized care plan for preventive services as well as general preventive health recommendations were provided to patient.     Shela Nevin, [place]  11/29/2017   Medical screening examination/treatment was performed by qualified clinical staff member and as supervising physician I was immediately available for consultation/collaboration. I have reviewed documentation and agree with assessment and plan.  Penni Homans, MD

## 2017-11-29 ENCOUNTER — Encounter: Payer: Self-pay | Admitting: *Deleted

## 2017-11-29 ENCOUNTER — Ambulatory Visit (INDEPENDENT_AMBULATORY_CARE_PROVIDER_SITE_OTHER): Payer: Medicare HMO | Admitting: *Deleted

## 2017-11-29 VITALS — BP 144/76 | HR 76 | Ht 65.0 in | Wt 286.0 lb

## 2017-11-29 DIAGNOSIS — Z0000 Encounter for general adult medical examination without abnormal findings: Secondary | ICD-10-CM | POA: Diagnosis not present

## 2017-11-29 NOTE — Patient Instructions (Signed)
Follow up with Dr.Blyth as scheduled 01/10/18.  Please schedule your next medicare wellness visit with me in 1 yr.  Eat heart healthy diet (full of fruits, vegetables, whole grains, lean protein, water--limit salt, fat, and sugar intake) and increase physical activity as tolerated.  Continue doing brain stimulating activities (puzzles, reading, adult coloring books, staying active) to keep memory sharp.    Sydney Pace , Thank you for taking time to come for your Medicare Wellness Visit. I appreciate your ongoing commitment to your health goals. Please review the following plan we discussed and let me know if I can assist you in the future.   These are the goals we discussed: Goals    . Pt wants to go back to school for diploma. (pt-stated)       This is a list of the screening recommended for you and due dates:  Health Maintenance  Topic Date Due  .  Hepatitis C: One time screening is recommended by Center for Disease Control  (CDC) for  adults born from [birth year] through 1966.   11-13-1955  . HIV Screening  01/07/1970  . Colon Cancer Screening  02/10/2015  . Pap Smear  05/17/2015  . Flu Shot  12/30/2017  . Mammogram  02/11/2019  . Tetanus Vaccine  08/23/2023    Health Maintenance for Postmenopausal Women Menopause is a normal process in which your reproductive ability comes to an end. This process happens gradually over a span of months to years, usually between the ages of 46 and 53. Menopause is complete when you have missed 12 consecutive menstrual periods. It is important to talk with your health care provider about some of the most common conditions that affect postmenopausal women, such as heart disease, cancer, and bone loss (osteoporosis). Adopting a healthy lifestyle and getting preventive care can help to promote your health and wellness. Those actions can also lower your chances of developing some of these common conditions. What should I know about menopause? During  menopause, you may experience a number of symptoms, such as:  Moderate-to-severe hot flashes.  Night sweats.  Decrease in sex drive.  Mood swings.  Headaches.  Tiredness.  Irritability.  Memory problems.  Insomnia.  Choosing to treat or not to treat menopausal changes is an individual decision that you make with your health care provider. What should I know about hormone replacement therapy and supplements? Hormone therapy products are effective for treating symptoms that are associated with menopause, such as hot flashes and night sweats. Hormone replacement carries certain risks, especially as you become older. If you are thinking about using estrogen or estrogen with progestin treatments, discuss the benefits and risks with your health care provider. What should I know about heart disease and stroke? Heart disease, heart attack, and stroke become more likely as you age. This may be due, in part, to the hormonal changes that your body experiences during menopause. These can affect how your body processes dietary fats, triglycerides, and cholesterol. Heart attack and stroke are both medical emergencies. There are many things that you can do to help prevent heart disease and stroke:  Have your blood pressure checked at least every 1-2 years. High blood pressure causes heart disease and increases the risk of stroke.  If you are 17-34 years old, ask your health care provider if you should take aspirin to prevent a heart attack or a stroke.  Do not use any tobacco products, including cigarettes, chewing tobacco, or electronic cigarettes. If you need help quitting,  ask your health care provider.  It is important to eat a healthy diet and maintain a healthy weight. ? Be sure to include plenty of vegetables, fruits, low-fat dairy products, and lean protein. ? Avoid eating foods that are high in solid fats, added sugars, or salt (sodium).  Get regular exercise. This is one of the most  important things that you can do for your health. ? Try to exercise for at least 150 minutes each week. The type of exercise that you do should increase your heart rate and make you sweat. This is known as moderate-intensity exercise. ? Try to do strengthening exercises at least twice each week. Do these in addition to the moderate-intensity exercise.  Know your numbers.Ask your health care provider to check your cholesterol and your blood glucose. Continue to have your blood tested as directed by your health care provider.  What should I know about cancer screening? There are several types of cancer. Take the following steps to reduce your risk and to catch any cancer development as early as possible. Breast Cancer  Practice breast self-awareness. ? This means understanding how your breasts normally appear and feel. ? It also means doing regular breast self-exams. Let your health care provider know about any changes, no matter how small.  If you are 5 or older, have a clinician do a breast exam (clinical breast exam or CBE) every year. Depending on your age, family history, and medical history, it may be recommended that you also have a yearly breast X-ray (mammogram).  If you have a family history of breast cancer, talk with your health care provider about genetic screening.  If you are at high risk for breast cancer, talk with your health care provider about having an MRI and a mammogram every year.  Breast cancer (BRCA) gene test is recommended for women who have family members with BRCA-related cancers. Results of the assessment will determine the need for genetic counseling and BRCA1 and for BRCA2 testing. BRCA-related cancers include these types: ? Breast. This occurs in males or females. ? Ovarian. ? Tubal. This may also be called fallopian tube cancer. ? Cancer of the abdominal or pelvic lining (peritoneal cancer). ? Prostate. ? Pancreatic.  Cervical, Uterine, and Ovarian  Cancer Your health care provider may recommend that you be screened regularly for cancer of the pelvic organs. These include your ovaries, uterus, and vagina. This screening involves a pelvic exam, which includes checking for microscopic changes to the surface of your cervix (Pap test).  For women ages 21-65, health care providers may recommend a pelvic exam and a Pap test every three years. For women ages 57-65, they may recommend the Pap test and pelvic exam, combined with testing for human papilloma virus (HPV), every five years. Some types of HPV increase your risk of cervical cancer. Testing for HPV may also be done on women of any age who have unclear Pap test results.  Other health care providers may not recommend any screening for nonpregnant women who are considered low risk for pelvic cancer and have no symptoms. Ask your health care provider if a screening pelvic exam is right for you.  If you have had past treatment for cervical cancer or a condition that could lead to cancer, you need Pap tests and screening for cancer for at least 20 years after your treatment. If Pap tests have been discontinued for you, your risk factors (such as having a new sexual partner) need to be reassessed to determine  if you should start having screenings again. Some women have medical problems that increase the chance of getting cervical cancer. In these cases, your health care provider may recommend that you have screening and Pap tests more often.  If you have a family history of uterine cancer or ovarian cancer, talk with your health care provider about genetic screening.  If you have vaginal bleeding after reaching menopause, tell your health care provider.  There are currently no reliable tests available to screen for ovarian cancer.  Lung Cancer Lung cancer screening is recommended for adults 20-21 years old who are at high risk for lung cancer because of a history of smoking. A yearly low-dose CT scan  of the lungs is recommended if you:  Currently smoke.  Have a history of at least 30 pack-years of smoking and you currently smoke or have quit within the past 15 years. A pack-year is smoking an average of one pack of cigarettes per day for one year.  Yearly screening should:  Continue until it has been 15 years since you quit.  Stop if you develop a health problem that would prevent you from having lung cancer treatment.  Colorectal Cancer  This type of cancer can be detected and can often be prevented.  Routine colorectal cancer screening usually begins at age 64 and continues through age 26.  If you have risk factors for colon cancer, your health care provider may recommend that you be screened at an earlier age.  If you have a family history of colorectal cancer, talk with your health care provider about genetic screening.  Your health care provider may also recommend using home test kits to check for hidden blood in your stool.  A small camera at the end of a tube can be used to examine your colon directly (sigmoidoscopy or colonoscopy). This is done to check for the earliest forms of colorectal cancer.  Direct examination of the colon should be repeated every 5-10 years until age 49. However, if early forms of precancerous polyps or small growths are found or if you have a family history or genetic risk for colorectal cancer, you may need to be screened more often.  Skin Cancer  Check your skin from head to toe regularly.  Monitor any moles. Be sure to tell your health care provider: ? About any new moles or changes in moles, especially if there is a change in a mole's shape or color. ? If you have a mole that is larger than the size of a pencil eraser.  If any of your family members has a history of skin cancer, especially at a young age, talk with your health care provider about genetic screening.  Always use sunscreen. Apply sunscreen liberally and repeatedly  throughout the day.  Whenever you are outside, protect yourself by wearing long sleeves, pants, a wide-brimmed hat, and sunglasses.  What should I know about osteoporosis? Osteoporosis is a condition in which bone destruction happens more quickly than new bone creation. After menopause, you may be at an increased risk for osteoporosis. To help prevent osteoporosis or the bone fractures that can happen because of osteoporosis, the following is recommended:  If you are 47-74 years old, get at least 1,000 mg of calcium and at least 600 mg of vitamin D per day.  If you are older than age 78 but younger than age 57, get at least 1,200 mg of calcium and at least 600 mg of vitamin D per day.  If  you are older than age 37, get at least 1,200 mg of calcium and at least 800 mg of vitamin D per day.  Smoking and excessive alcohol intake increase the risk of osteoporosis. Eat foods that are rich in calcium and vitamin D, and do weight-bearing exercises several times each week as directed by your health care provider. What should I know about how menopause affects my mental health? Depression may occur at any age, but it is more common as you become older. Common symptoms of depression include:  Low or sad mood.  Changes in sleep patterns.  Changes in appetite or eating patterns.  Feeling an overall lack of motivation or enjoyment of activities that you previously enjoyed.  Frequent crying spells.  Talk with your health care provider if you think that you are experiencing depression. What should I know about immunizations? It is important that you get and maintain your immunizations. These include:  Tetanus, diphtheria, and pertussis (Tdap) booster vaccine.  Influenza every year before the flu season begins.  Pneumonia vaccine.  Shingles vaccine.  Your health care provider may also recommend other immunizations. This information is not intended to replace advice given to you by your health  care provider. Make sure you discuss any questions you have with your health care provider. Document Released: 07/10/2005 Document Revised: 12/06/2015 Document Reviewed: 02/19/2015 Elsevier Interactive Patient Education  2018 [employer].

## 2018-01-10 ENCOUNTER — Ambulatory Visit (INDEPENDENT_AMBULATORY_CARE_PROVIDER_SITE_OTHER): Payer: Medicare HMO | Admitting: Family Medicine

## 2018-01-10 DIAGNOSIS — I10 Essential (primary) hypertension: Secondary | ICD-10-CM

## 2018-01-10 DIAGNOSIS — G35 Multiple sclerosis: Secondary | ICD-10-CM | POA: Diagnosis not present

## 2018-01-10 DIAGNOSIS — E782 Mixed hyperlipidemia: Secondary | ICD-10-CM | POA: Diagnosis not present

## 2018-01-10 DIAGNOSIS — R7989 Other specified abnormal findings of blood chemistry: Secondary | ICD-10-CM | POA: Diagnosis not present

## 2018-01-10 DIAGNOSIS — M7989 Other specified soft tissue disorders: Secondary | ICD-10-CM

## 2018-01-10 DIAGNOSIS — F329 Major depressive disorder, single episode, unspecified: Secondary | ICD-10-CM

## 2018-01-10 DIAGNOSIS — G35D Multiple sclerosis, unspecified: Secondary | ICD-10-CM

## 2018-01-10 DIAGNOSIS — E669 Obesity, unspecified: Secondary | ICD-10-CM | POA: Diagnosis not present

## 2018-01-10 DIAGNOSIS — R739 Hyperglycemia, unspecified: Secondary | ICD-10-CM

## 2018-01-10 DIAGNOSIS — M8580 Other specified disorders of bone density and structure, unspecified site: Secondary | ICD-10-CM

## 2018-01-10 DIAGNOSIS — F32A Depression, unspecified: Secondary | ICD-10-CM

## 2018-01-10 DIAGNOSIS — R69 Illness, unspecified: Secondary | ICD-10-CM | POA: Diagnosis not present

## 2018-01-10 LAB — CBC
HCT: 36.4 % (ref 36.0–46.0)
Hemoglobin: 12.5 g/dL (ref 12.0–15.0)
MCHC: 34.2 g/dL (ref 30.0–36.0)
MCV: 88.7 fl (ref 78.0–100.0)
Platelets: 242 10*3/uL (ref 150.0–400.0)
RBC: 4.11 Mil/uL (ref 3.87–5.11)
RDW: 15.1 % (ref 11.5–15.5)
WBC: 6.6 10*3/uL (ref 4.0–10.5)

## 2018-01-10 LAB — COMPREHENSIVE METABOLIC PANEL
ALBUMIN: 4 g/dL (ref 3.5–5.2)
ALT: 11 U/L (ref 0–35)
AST: 16 U/L (ref 0–37)
Alkaline Phosphatase: 87 U/L (ref 39–117)
BUN: 27 mg/dL — AB (ref 6–23)
CHLORIDE: 102 meq/L (ref 96–112)
CO2: 32 meq/L (ref 19–32)
Calcium: 10.4 mg/dL (ref 8.4–10.5)
Creatinine, Ser: 1.07 mg/dL (ref 0.40–1.20)
GFR: 66.61 mL/min (ref >60.00)
GLUCOSE: 114 mg/dL — AB (ref 70–99)
Potassium: 4.8 mEq/L (ref 3.5–5.1)
SODIUM: 140 meq/L (ref 135–145)
Total Bilirubin: 0.4 mg/dL (ref 0.2–1.2)
Total Protein: 7.7 g/dL (ref 6.0–8.3)

## 2018-01-10 LAB — LIPID PANEL
CHOL/HDL RATIO: 4
CHOLESTEROL: 161 mg/dL (ref 0–200)
HDL: 45.1 mg/dL (ref >39.00)
LDL CALC: 103 mg/dL — AB (ref 0–99)
NONHDL: 116.11
Triglycerides: 67 mg/dL (ref 0.0–149.0)
VLDL: 13.4 mg/dL (ref 0.0–40.0)

## 2018-01-10 LAB — VITAMIN D 25 HYDROXY (VIT D DEFICIENCY, FRACTURES): VITD: 71.52 ng/mL (ref 30.00–100.00)

## 2018-01-10 LAB — HEMOGLOBIN A1C: Hgb A1c MFr Bld: 5.8 % (ref 4.6–6.5)

## 2018-01-10 LAB — TSH: TSH: 0.62 u[IU]/mL (ref 0.35–4.50)

## 2018-01-10 MED ORDER — VENLAFAXINE HCL ER 37.5 MG PO CP24: 37.5000 mg | ORAL_CAPSULE | Freq: Every day | ORAL | 3 refills | Status: DC

## 2018-01-10 MED ORDER — FUROSEMIDE 20 MG PO TABS: 20.0000 mg | ORAL_TABLET | Freq: Every day | ORAL | 1 refills | Status: DC | PRN

## 2018-01-10 NOTE — Assessment & Plan Note (Signed)
Encouraged compression hose, minimize sodium elevate feet and refill the Lasix

## 2018-01-10 NOTE — Assessment & Plan Note (Signed)
No flares.

## 2018-01-10 NOTE — Assessment & Plan Note (Signed)
Encouraged DASH diet, decrease po intake and increase exercise as tolerated. Needs 7-8 hours of sleep nightly. Avoid trans fats, eat small, frequent meals every 4-5 hours with lean proteins, complex carbs and healthy fats. Minimize simple carbs 

## 2018-01-10 NOTE — Assessment & Plan Note (Signed)
Encouraged to get adequate exercise, calcium and vitamin d intake 
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
## 2018-01-10 NOTE — Assessment & Plan Note (Signed)
Encouraged heart healthy diet, increase exercise, avoid trans fats, consider a krill oil cap daily 

## 2018-01-10 NOTE — Assessment & Plan Note (Signed)
Well controlled, no changes to meds. Encouraged heart healthy diet such as the DASH diet and exercise as tolerated.  °

## 2018-01-10 NOTE — Patient Instructions (Addendum)
Shingrix is the new shingles shot, 2 shots over 2-6 months at pharmacy Compression hose, knee highs light weight, elevate feet, minimize salt Try Dr Felicie Morn inserts at [facility] they have a machine   Hypertension Hypertension is another name for high blood pressure. High blood pressure forces your heart to work harder to pump blood. This can cause problems over time. There are two numbers in a blood pressure reading. There is a top number (systolic) over a bottom number (diastolic). It is best to have a blood pressure below 120/80. Healthy choices can help lower your blood pressure. You may need medicine to help lower your blood pressure if:  Your blood pressure cannot be lowered with healthy choices.  Your blood pressure is higher than 130/80.  Follow these instructions at home: Eating and drinking  If directed, follow the DASH eating plan. This diet includes: ? Filling half of your plate at each meal with fruits and vegetables. ? Filling one quarter of your plate at each meal with whole grains. Whole grains include whole wheat pasta, brown rice, and whole grain bread. ? Eating or drinking low-fat dairy products, such as skim milk or low-fat yogurt. ? Filling one quarter of your plate at each meal with low-fat (lean) proteins. Low-fat proteins include fish, skinless chicken, eggs, beans, and tofu. ? Avoiding fatty meat, cured and processed meat, or chicken with skin. ? Avoiding premade or processed food.  Eat less than 1,500 mg of salt (sodium) a day.  Limit alcohol use to no more than 1 drink a day for nonpregnant women and 2 drinks a day for men. One drink equals 12 oz of beer, 5 oz of wine, or 1 oz of hard liquor. Lifestyle  Work with your doctor to stay at a healthy weight or to lose weight. Ask your doctor what the best weight is for you.  Get at least 30 minutes of exercise that causes your heart to beat faster (aerobic exercise) most days of the week. This may include  walking, swimming, or biking.  Get at least 30 minutes of exercise that strengthens your muscles (resistance exercise) at least 3 days a week. This may include lifting weights or pilates.  Do not use any products that contain nicotine or tobacco. This includes cigarettes and e-cigarettes. If you need help quitting, ask your doctor.  Check your blood pressure at home as told by your doctor.  Keep all follow-up visits as told by your doctor. This is important. Medicines  Take over-the-counter and prescription medicines only as told by your doctor. Follow directions carefully.  Do not skip doses of blood pressure medicine. The medicine does not work as well if you skip doses. Skipping doses also puts you at risk for problems.  Ask your doctor about side effects or reactions to medicines that you should watch for. Contact a doctor if:  You think you are having a reaction to the medicine you are taking.  You have headaches that keep coming back (recurring).  You feel dizzy.  You have swelling in your ankles.  You have trouble with your vision. Get help right away if:  You get a very bad headache.  You start to feel confused.  You feel weak or numb.  You feel faint.  You get very bad pain in your: ? Chest. ? Belly (abdomen).  You throw up (vomit) more than once.  You have trouble breathing. Summary  Hypertension is another name for high blood pressure.  Making healthy choices can  help lower blood pressure. If your blood pressure cannot be controlled with healthy choices, you may need to take medicine. This information is not intended to replace advice given to you by your health care provider. Make sure you discuss any questions you have with your health care provider. Document Released: 11/04/2007 Document Revised: 04/15/2016 Document Reviewed: 04/15/2016 Elsevier Interactive Patient Education  [employer].

## 2018-01-10 NOTE — Assessment & Plan Note (Signed)
Is struggling with anhedonia and depression but denis suicidal ideation continue Sertraline 100 mg daily, add Venlafaxine XR 37.5 mg daily  In am. Reassess in 10-12 weeks

## 2018-01-10 NOTE — Assessment & Plan Note (Signed)
elvated PTH on last blood draw, asymptomatic. monitor

## 2018-01-10 NOTE — Assessment & Plan Note (Addendum)
Mild and asymptomatic, check labs today

## 2018-01-11 LAB — PTH, INTACT AND CALCIUM
Calcium: 10.2 mg/dL (ref 8.6–10.4)
PTH: 62 pg/mL (ref 14–64)

## 2018-01-27 ENCOUNTER — Telehealth: Payer: Self-pay | Admitting: Podiatry

## 2018-01-27 NOTE — Telephone Encounter (Signed)
Pt left message stating she needed an appt to pick up orthotics that her insurance has paid for them.  I returned call and left a message to call me directly and I can get her scheduled. The next available is 9.5.19

## 2018-01-28 NOTE — Telephone Encounter (Signed)
Pt returned call and left message that the date was fine that I had left but to call because she deleted it.  I returned call and scheduled pt to see Liliane Channel on 9.5.19 @ 845 to pick up the orthotics.

## 2018-02-03 ENCOUNTER — Ambulatory Visit: Payer: Medicare HMO | Admitting: Orthotics

## 2018-02-03 DIAGNOSIS — M79672 Pain in left foot: Secondary | ICD-10-CM

## 2018-02-03 DIAGNOSIS — M722 Plantar fascial fibromatosis: Secondary | ICD-10-CM

## 2018-02-03 DIAGNOSIS — G8929 Other chronic pain: Secondary | ICD-10-CM

## 2018-02-03 DIAGNOSIS — M7752 Other enthesopathy of left foot: Secondary | ICD-10-CM

## 2018-02-03 NOTE — Progress Notes (Signed)
Patient came in today to pick up custom made foot orthotics.  The goals were accomplished and the patient reported no dissatisfaction with said orthotics.  Patient was advised of breakin period and how to report any issues. 

## 2018-02-13 ENCOUNTER — Other Ambulatory Visit: Payer: Self-pay | Admitting: Neurology

## 2018-02-14 ENCOUNTER — Other Ambulatory Visit: Payer: Self-pay

## 2018-02-14 ENCOUNTER — Ambulatory Visit: Payer: Medicare HMO | Admitting: Neurology

## 2018-02-14 ENCOUNTER — Encounter: Payer: Self-pay | Admitting: Neurology

## 2018-02-14 VITALS — BP 133/82 | HR 87 | Resp 18 | Ht 65.0 in | Wt 287.5 lb

## 2018-02-14 DIAGNOSIS — N3281 Overactive bladder: Secondary | ICD-10-CM

## 2018-02-14 DIAGNOSIS — R260 Ataxic gait: Secondary | ICD-10-CM | POA: Diagnosis not present

## 2018-02-14 DIAGNOSIS — F32A Depression, unspecified: Secondary | ICD-10-CM

## 2018-02-14 DIAGNOSIS — R69 Illness, unspecified: Secondary | ICD-10-CM | POA: Diagnosis not present

## 2018-02-14 DIAGNOSIS — G35 Multiple sclerosis: Secondary | ICD-10-CM | POA: Diagnosis not present

## 2018-02-14 DIAGNOSIS — H469 Unspecified optic neuritis: Secondary | ICD-10-CM | POA: Diagnosis not present

## 2018-02-14 DIAGNOSIS — G35D Multiple sclerosis, unspecified: Secondary | ICD-10-CM

## 2018-02-14 DIAGNOSIS — G4700 Insomnia, unspecified: Secondary | ICD-10-CM

## 2018-02-14 DIAGNOSIS — R5383 Other fatigue: Secondary | ICD-10-CM | POA: Diagnosis not present

## 2018-02-14 DIAGNOSIS — F329 Major depressive disorder, single episode, unspecified: Secondary | ICD-10-CM | POA: Diagnosis not present

## 2018-02-14 MED ORDER — BUPROPION HCL ER (XL) 300 MG PO TB24: 300.0000 mg | ORAL_TABLET | Freq: Every day | ORAL | 11 refills | Status: DC

## 2018-02-14 MED ORDER — MODAFINIL 200 MG PO TABS: ORAL_TABLET | ORAL | 5 refills | Status: DC

## 2018-02-14 MED ORDER — OXYBUTYNIN CHLORIDE 5 MG PO TABS: 5.0000 mg | ORAL_TABLET | Freq: Two times a day (BID) | ORAL | 3 refills | Status: DC

## 2018-02-14 NOTE — Progress Notes (Signed)
u  [facility]  PATIENT: Sydney Pace DOB: June 18, 1955  REFERRING DOCTOR OR PCP:  Penni Homans  _________________________________   HISTORICAL  CHIEF COMPLAINT:  Chief Complaint  Patient presents with  . Multiple Sclerosis    Remains off of a dmt for MS. Sts. she feels "blah."  Sts. pcp added Effexor for mood but she does not feel it is helping yet./fim     HISTORY OF PRESENT ILLNESS:  Sydney Pace is a 62 y.o. woman with multiple sclerosis.      Update 02/14/2018: She is off of a disease modifying therapy and continues to be stable.  Her last MRI 02/28/2017 was consistent with MS but did not show any significant changes compared to the MRI from 2007, implying a very mild MS.     She is noting more depression.   Her PCP placed her on Effexor XR 37.5 and she is already on on Zoloft 100 mg.    In the past she was on Wellbutrin a short while and tolerated it well but had insurance issues.        She has sleepiness and fatigue.   Modafinil helps her more than the ritalin does.   She takes one every morning.   Sleep is better with gabapentin.  She often sleeps in her living room chair.    Her gait is slow but few stumbles and no recent falls.   She feels strength is fine and sensation is unchanged with painful left arm numbness at time..    Vision is wore in her left eye where she had ON.     Bladder function is improved with Ditropan  From 08/12/2017: Her MS is stable ans she remains off a DMT. Her MRI 02/28/2017 was consistent with MS but not changed compared to 2007.   She is noting more trouble with her left foot and is scheduled to see podiatry tomorrow.   She has been less active than she used to be.  She often feels stiff   She denies any falls.  No significant weakness.    The left eye still has reduced vision from ON but colors are symmetric.    She has bladder urgency but no incontinence.   Oxybutynin helps.  Mood is poor despite Zoloft.   She notes financial  stress and she needs to borrow her sisters car   Mood is still better than 2013-2015.    Fatigue bothers her daily.   Ritalin has helped more than anything else.   She alternates Ritalin and Provigil.   She still has some insomnia but has done better with gabapentin at night.  Update 02/11/2017:   She feels her gait is mostly stable but she has more trouble walking fast and has stumbled some but has no falls.   She denies significant weakness or numbness in the arms and legs.    She feels her bladder function is stable with urgency and frequency but no incontinence.     She had left ON in the past and that eye is still blurry.    Colors are not desaturated, however.   Fatigue is a little worse recently.   She is on Ritalin and modafinil (but does not take every day).   She has some insomnia and gabapentin has not helped as much lately.    She has depression.   She is on Zoloft.  Mood was much worse 3-4 years ago when she had a lot of crying spells.  She notes mild cognitive issues.    She is off any disease modifying therapy. She has been on Avonex, Copaxone and Gilenya in the past but had difficulty tolerating them.  ________________________________ From 07/30/2016 Gait/strength/sensation:   Gait is stable but balance is mildly off.   She stumbles and occasinally falls -- last time in her bathroom getting out of tub.   She got bruisedfalls.   She continues to report an uncomfortable burnig dysesthesias in her legs.  Arms are fine.   The right leg tires out easily. She also gets muscle aches in both legs. Legs are stiff, especially if she is sitting or laying for long time. She takes gabapentin 600 mg at night because more makes her sleepy.   She was on meloxicam.   .    Bladder:  She feels bladder function is mildly abnormal but stable.  Oxybutynin helps   She reports urinary urgency with frequency, helped by Oxybutynin.   There is mild hesitancy but no recent urinary tract infections.  Vision/vertigo:    Vision is slightly off on the left but stable. She had left ON in past and in the past  She had visual field testing showing a right superior homonymous field cut.      Vertigo has resolved.  Fatigue/sleep:  She has a lot of fatigue that she is both physical and cognitive.   It is worse with an URI.  Marland Kitchen She has received some benefit from methylphenidate 20 mg bid to tid.   Provigil helped a little better was very She has insomnia,  sleep maintenance  Worse than sleep onset helped by gabapentin 300 mg nightly with continued benefit and less hangover.  .   She snores. No one has ever told her that she has apnea.  Mood/cognition: Depression is still presetn and she feel mood goes up and down.     She is on sertraline and tolerates it well.   She sees Dr. Charlett Blake.   She had a major depression that worsened in 2014 and is better but still an issue.   She notes some mild cognitive dysfunction and reduced attention and focus. Specifically there is some difficulty with short-term memory, verbal fluency and processing speed.  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  MRI was performed in 1983 and was consistent with MS.     In the late 1990's, she was started on Copaxone but stopped after several weeks due to skin reactions. She also tried Avonex but had a rash and stopped. She started Gilenya in January 2014 but stopped after 7 or 8 months due to being more fatigue and having flulike symptoms. In May 2015 we had discussed Aubagio. She decided not to start.  She had an MRI of the brain with and without contrast on 10/27/2013 and I reviewed the study. It showed white matter foci in a pattern consistent with MS. There was no enhancement. When compared to an MRI dated 11/08/2009, there was no interval change.   REVIEW OF SYSTEMS: Constitutional: No fevers, chills, sweats, or change in appetite.   Notes fatigue.   Insomnia Eyes: see above.  No double vision, eye  pain..  Some eye redness Ear, nose and throat: No hearing loss, ear pain, nasal congestion, sore throat Cardiovascular: No chest pain, palpitations Respiratory: No shortness of breath at rest or with exertion.   No wheezes GastrointestinaI: No nausea, vomiting, diarrhea, abdominal pain,  fecal incontinence Genitourinary: Mild urinary frequency.  No nocturia. Musculoskeletal: No neck pain, back pain.  Some hip pain Integumentary: No rash, pruritus, skin lesions Neurological: as above Psychiatric: Mild depression at this time, rarely cries, less anxiety Endocrine: No palpitations, diaphoresis, change in appetite, change in weigh or increased thirst Hematologic/Lymphatic: No anemia, purpura, petechiae. Allergic/Immunologic: No itchy/runny eyes, nasal congestion, recent allergic reactions, rashes  ALLERGIES: No Known Allergies  HOME MEDICATIONS:  Current Outpatient Medications:  .  Ascorbic Acid (VITAMIN C) 100 MG tablet, Take 100 mg by mouth daily., Disp: , Rfl:  .  calcium carbonate 200 MG capsule, Take 250 mg by mouth 2 (two) times daily with a meal., Disp: , Rfl:  .  cetirizine (ZYRTEC) 10 MG tablet, Take 10 mg by mouth daily., Disp: , Rfl:  .  Cholecalciferol (VITAMIN D PO), Take by mouth., Disp: , Rfl:  .  Docusate Calcium (STOOL SOFTENER PO), Take by mouth daily as needed., Disp: , Rfl:  .  furosemide (LASIX) 20 MG tablet, Take 1 tablet (20 mg total) by mouth daily as needed., Disp: 90 tablet, Rfl: 1 .  gabapentin (NEURONTIN) 600 MG tablet, Take one or two pills at night, Disp: 60 tablet, Rfl: 11 .  hyoscyamine (LEVSIN SL) 0.125 MG SL tablet, Place 1 tablet (0.125 mg total) under the tongue every 4 (four) hours as needed., Disp: 30 tablet, Rfl: 1 .  lisinopril-hydrochlorothiazide (PRINZIDE,ZESTORETIC) 20-25 MG tablet, TAKE 1 TABLET BY MOUTH ONCE DAILY, Disp: 90 tablet, Rfl: 1 .  meloxicam (MOBIC) 15 MG tablet, Take 1 tablet (15 mg total) by mouth daily as needed for pain. With  food, Disp: 14 tablet, Rfl: 0 .  methylphenidate (RITALIN) 20 MG tablet, Take 1 tablet (20 mg total) by mouth 3 (three) times daily. (Patient taking differently: Take 20 mg by mouth once. ), Disp: 90 tablet, Rfl: 0 .  modafinil (PROVIGIL) 200 MG tablet, One po in AM and at noon, Disp: 60 tablet, Rfl: 5 .  Multiple Vitamin (MULTIVITAMIN) tablet, Take 1 tablet by mouth daily., Disp: , Rfl:  .  oxybutynin (DITROPAN) 5 MG tablet, Take 1 tablet (5 mg total) by mouth 2 (two) times daily., Disp: 180 tablet, Rfl: 3 .  sertraline (ZOLOFT) 100 MG tablet, Take 1 tablet (100 mg total) by mouth daily., Disp: 90 tablet, Rfl: 1 .  buPROPion (WELLBUTRIN XL) 300 MG 24 hr tablet, Take 1 tablet (300 mg total) by mouth daily., Disp: 30 tablet, Rfl: 11  PAST MEDICAL HISTORY: Past Medical History:  Diagnosis Date  . Arthritis   . Arthritis 07/06/2017  . Dehydration 06/09/2016  . Depression   . Esophageal reflux 08/08/2012  . GERD (gastroesophageal reflux disease)   . Hypertension   . MS (multiple sclerosis) ([place])   . Other and unspecified hyperlipidemia 08/08/2012  . Thyroid disease     PAST SURGICAL HISTORY: Past Surgical History:  Procedure Laterality Date  . HYSTEROSCOPY  02, 04, 2008   with D&C  . TMJ ARTHROSCOPY    . TONSILLECTOMY      FAMILY HISTORY: Family History  Problem Relation Age of Onset  . Heart disease Mother        pacemaker  . Emphysema Mother   . Hypertension Mother   . Heart disease Father   . Diabetes Father   . Heart disease Sister        cad  . Sleep apnea Brother   . Colon cancer Neg Hx   . Esophageal cancer Neg Hx   .  Rectal cancer Neg Hx   . Stomach cancer Neg Hx     SOCIAL HISTORY:  Social History   Socioeconomic History  . Marital status: Single    Spouse name: Not on file  . Number of children: Not on file  . Years of education: Not on file  . Highest education level: Not on file  Occupational History  . Not on file  Social Needs  . Financial resource  strain: Not on file  . Food insecurity:    Worry: Not on file    Inability: Not on file  . Transportation needs:    Medical: Not on file    Non-medical: Not on file  Tobacco Use  . Smoking status: Never Smoker  . Smokeless tobacco: Never Used  Substance and Sexual Activity  . Alcohol use: Yes    Alcohol/week: 0.0 standard drinks    Comment: occassional  . Drug use: No  . Sexual activity: Never    Comment: 1st intercourse 1 yo-5 partners  Lifestyle  . Physical activity:    Days per week: Not on file    Minutes per session: Not on file  . Stress: Not on file  Relationships  . Social connections:    Talks on phone: Not on file    Gets together: Not on file    Attends religious service: Not on file    Active member of club or organization: Not on file    Attends meetings of clubs or organizations: Not on file    Relationship status: Not on file  . Intimate partner violence:    Fear of current or ex partner: Not on file    Emotionally abused: Not on file    Physically abused: Not on file    Forced sexual activity: Not on file  Other Topics Concern  . Not on file  Social History Narrative  . Not on file     PHYSICAL EXAM  Vitals:   02/14/18 1001  BP: 133/82  Pulse: 87  Resp: 18  Weight: 287 lb 8 oz (130.4 kg)  Height: 5\' 5"  (1.651 m)    Body mass index is 47.84 kg/m.   General: The patient is well-developed and well-nourished and in no acute distress  Neurologic Exam  Mental status: The patient is alert and oriented x 3 at the time of the examination. The patient has apparent normal recent and remote memory, with an apparently normal attention span and concentration ability.   Speech is normal.  Cranial nerves: Extraocular movements are full.  Color vision is reduced out of the left eye.  Facial strength and sensation was normal.  Trapezius strength was normal.  No dysarthria is noted.    No obvious hearing deficits are noted.  Motor:  Muscle bulk is  normal.   Muscle tone is increased in her legs, right greater than left. Strength is 5/5 in the arms and legs.   Sensory: S sensation to touch and temperature and vibration was normal.  Coordination: Cerebellar testing reveals good finger-nose-finger bilaterally.  Gait and station: Station is normal.   Her gait has a slightly reduced stride.  The tandem gait is poor.  Romberg is negative.  Reflexes: Deep tendon reflexes are symmetric and normal in arms, 3+ at knees with mild spread and 2 at the ankles.Marland Kitchen        DIAGNOSTIC DATA (LABS, IMAGING, TESTING) - I reviewed patient records, labs, notes, testing and imaging myself where available.  Lab Results  Component Value  Date   WBC 6.6 01/10/2018   HGB 12.5 01/10/2018   HCT 36.4 01/10/2018   MCV 88.7 01/10/2018   PLT 242.0 01/10/2018      Component Value Date/Time   NA 140 01/10/2018 1055   K 4.8 01/10/2018 1055   CL 102 01/10/2018 1055   CO2 32 01/10/2018 1055   GLUCOSE 114 (H) 01/10/2018 1055   BUN 27 (H) 01/10/2018 1055   CREATININE 1.07 01/10/2018 1055   CREATININE 0.91 04/22/2016 1730   CALCIUM 10.4 01/10/2018 1055   CALCIUM 10.2 01/10/2018 1055   PROT 7.7 01/10/2018 1055   ALBUMIN 4.0 01/10/2018 1055   AST 16 01/10/2018 1055   ALT 11 01/10/2018 1055   ALKPHOS 87 01/10/2018 1055   BILITOT 0.4 01/10/2018 1055   Lab Results  Component Value Date   CHOL 161 01/10/2018   HDL 45.10 01/10/2018   LDLCALC 103 (H) 01/10/2018   TRIG 67.0 01/10/2018   CHOLHDL 4 01/10/2018   Lab Results  Component Value Date   HGBA1C 5.8 01/10/2018   No results found for: VITAMINB12 Lab Results  Component Value Date   TSH 0.62 01/10/2018       ASSESSMENT AND PLAN  Multiple sclerosis (HCC)  Optic neuritis  Overactive bladder  Depression, unspecified depression type  Insomnia, unspecified type  Ataxic gait  Other fatigue   1.   She will continue off a DMT by her choice and she has no recent exacerbation. 2.    Increase Provigil for attention and wakefulness and fatigue 3.   She will continue gabapentin for insomnia and dysesthesias and Zoloft for her mood.   Add buproprion 4.   Exercise as tolerated and try to lose weight.   We also discussed getting out of the house more --- she gets out to a bible study weekly but many days stays in her home 5.   Continue Vit D 5000 U daily She will return to see me in 6 months or sooner if she has new or worsening neurologic symptoms.   Tuan Tippin A. Felecia Shelling, MD, PhD 0/21/1155, 20:80 AM Certified in Neurology, Clinical Neurophysiology, Sleep Medicine, Pain Medicine and Neuroimaging  [facility] [address], [place], [place] [address] [phone]

## 2018-03-17 ENCOUNTER — Ambulatory Visit (INDEPENDENT_AMBULATORY_CARE_PROVIDER_SITE_OTHER): Payer: Medicare HMO | Admitting: Family Medicine

## 2018-03-17 VITALS — BP 118/76 | HR 85 | Temp 98.0°F | Resp 18 | Wt 281.6 lb

## 2018-03-17 DIAGNOSIS — I10 Essential (primary) hypertension: Secondary | ICD-10-CM | POA: Diagnosis not present

## 2018-03-17 DIAGNOSIS — N950 Postmenopausal bleeding: Secondary | ICD-10-CM

## 2018-03-17 DIAGNOSIS — Z1211 Encounter for screening for malignant neoplasm of colon: Secondary | ICD-10-CM | POA: Diagnosis not present

## 2018-03-17 DIAGNOSIS — R300 Dysuria: Secondary | ICD-10-CM | POA: Insufficient documentation

## 2018-03-17 LAB — POC URINALSYSI DIPSTICK (AUTOMATED)
BILIRUBIN UA: NEGATIVE
Blood, UA: POSITIVE
GLUCOSE UA: NEGATIVE
Ketones, UA: NEGATIVE
Nitrite, UA: POSITIVE
Protein, UA: POSITIVE — AB
Urobilinogen, UA: 0.2 E.U./dL
pH, UA: 6 (ref 5.0–8.0)

## 2018-03-17 MED ORDER — CEFDINIR 300 MG PO CAPS: 300.0000 mg | ORAL_CAPSULE | Freq: Two times a day (BID) | ORAL | 0 refills | Status: AC

## 2018-03-17 MED ORDER — PHENAZOPYRIDINE HCL 200 MG PO TABS: 200.0000 mg | ORAL_TABLET | Freq: Three times a day (TID) | ORAL | 0 refills | Status: DC | PRN

## 2018-03-17 NOTE — Progress Notes (Signed)
Subjective:    Patient ID: Sydney Pace, female    DOB: [birth year], 62 y.o.   MRN: [id number]  No chief complaint on file.   HPI Patient is in today for evaluation of dysuria.  She is been struggling with dysuria and urinary frequency for a week.  No fevers chills.  No abdominal or worsening back pain.  No hematuria.  She has noted an episode of vaginal bleeding this week as well.  It was bright red and has only occurred once.  No significant change in bowels or blood in stool. Denies CP/palp/SOB/HA/congestion/fevers/GI or c/o. Taking meds as prescribed  Past Medical History:  Diagnosis Date  . Arthritis   . Arthritis 07/06/2017  . Dehydration 06/09/2016  . Depression   . Esophageal reflux 08/08/2012  . GERD (gastroesophageal reflux disease)   . Hypertension   . MS (multiple sclerosis) ([place])   . Other and unspecified hyperlipidemia 08/08/2012  . Thyroid disease     Past Surgical History:  Procedure Laterality Date  . HYSTEROSCOPY  02, 04, 2008   with D&C  . TMJ ARTHROSCOPY    . TONSILLECTOMY      Family History  Problem Relation Age of Onset  . Heart disease Mother        pacemaker  . Emphysema Mother   . Hypertension Mother   . Heart disease Father   . Diabetes Father   . Heart disease Sister        cad  . Sleep apnea Brother   . Colon cancer Neg Hx   . Esophageal cancer Neg Hx   . Rectal cancer Neg Hx   . Stomach cancer Neg Hx     Social History   Socioeconomic History  . Marital status: Single    Spouse name: Not on file  . Number of children: Not on file  . Years of education: Not on file  . Highest education level: Not on file  Occupational History  . Not on file  Social Needs  . Financial resource strain: Not on file  . Food insecurity:    Worry: Not on file    Inability: Not on file  . Transportation needs:    Medical: Not on file    Non-medical: Not on file  Tobacco Use  . Smoking status: Never Smoker  . Smokeless tobacco: Never Used    Substance and Sexual Activity  . Alcohol use: Yes    Alcohol/week: 0.0 standard drinks    Comment: occassional  . Drug use: No  . Sexual activity: Never    Comment: 1st intercourse 35 yo-5 partners  Lifestyle  . Physical activity:    Days per week: Not on file    Minutes per session: Not on file  . Stress: Not on file  Relationships  . Social connections:    Talks on phone: Not on file    Gets together: Not on file    Attends religious service: Not on file    Active member of club or organization: Not on file    Attends meetings of clubs or organizations: Not on file    Relationship status: Not on file  . Intimate partner violence:    Fear of current or ex partner: Not on file    Emotionally abused: Not on file    Physically abused: Not on file    Forced sexual activity: Not on file  Other Topics Concern  . Not on file  Social History Narrative  . Not on  file    Outpatient Medications Prior to Visit  Medication Sig Dispense Refill  . Ascorbic Acid (VITAMIN C) 100 MG tablet Take 100 mg by mouth daily.    Marland Kitchen buPROPion (WELLBUTRIN XL) 300 MG 24 hr tablet Take 1 tablet (300 mg total) by mouth daily. 30 tablet 11  . calcium carbonate 200 MG capsule Take 250 mg by mouth 2 (two) times daily with a meal.    . cetirizine (ZYRTEC) 10 MG tablet Take 10 mg by mouth daily.    . Cholecalciferol (VITAMIN D PO) Take by mouth.    Mariane Baumgarten Calcium (STOOL SOFTENER PO) Take by mouth daily as needed.    . furosemide (LASIX) 20 MG tablet Take 1 tablet (20 mg total) by mouth daily as needed. 90 tablet 1  . gabapentin (NEURONTIN) 600 MG tablet Take one or two pills at night 60 tablet 11  . hyoscyamine (LEVSIN SL) 0.125 MG SL tablet Place 1 tablet (0.125 mg total) under the tongue every 4 (four) hours as needed. 30 tablet 1  . lisinopril-hydrochlorothiazide (PRINZIDE,ZESTORETIC) 20-25 MG tablet TAKE 1 TABLET BY MOUTH ONCE DAILY 90 tablet 1  . meloxicam (MOBIC) 15 MG tablet Take 1 tablet (15 mg  total) by mouth daily as needed for pain. With food 14 tablet 0  . methylphenidate (RITALIN) 20 MG tablet Take 1 tablet (20 mg total) by mouth 3 (three) times daily. (Patient taking differently: Take 20 mg by mouth once. ) 90 tablet 0  . modafinil (PROVIGIL) 200 MG tablet One po in AM and at noon 60 tablet 5  . Multiple Vitamin (MULTIVITAMIN) tablet Take 1 tablet by mouth daily.    Marland Kitchen oxybutynin (DITROPAN) 5 MG tablet Take 1 tablet (5 mg total) by mouth 2 (two) times daily. 180 tablet 3  . sertraline (ZOLOFT) 100 MG tablet Take 1 tablet (100 mg total) by mouth daily. 90 tablet 1   No facility-administered medications prior to visit.     No Known Allergies  Review of Systems  Constitutional: Negative for fever and malaise/fatigue.  HENT: Negative for congestion.   Eyes: Negative for blurred vision.  Respiratory: Negative for shortness of breath.   Cardiovascular: Negative for chest pain, palpitations and leg swelling.  Gastrointestinal: Negative for abdominal pain, blood in stool and nausea.  Genitourinary: Positive for dysuria and frequency.  Musculoskeletal: Negative for falls.  Skin: Negative for rash.  Neurological: Negative for dizziness, loss of consciousness and headaches.  Endo/Heme/Allergies: Negative for environmental allergies.  Psychiatric/Behavioral: Negative for depression. The patient is not nervous/anxious.        Objective:    Physical Exam  Constitutional: She is oriented to person, place, and time. She appears well-developed and well-nourished. No distress.  HENT:  Head: Normocephalic and atraumatic.  Nose: Nose normal.  Eyes: Right eye exhibits no discharge. Left eye exhibits no discharge.  Neck: Normal range of motion. Neck supple.  Cardiovascular: Normal rate and regular rhythm.  No murmur heard. Pulmonary/Chest: Effort normal and breath sounds normal.  Abdominal: Soft. Bowel sounds are normal. There is no tenderness.  Musculoskeletal: She exhibits no  edema.  Neurological: She is alert and oriented to person, place, and time.  Skin: Skin is warm and dry.  Psychiatric: She has a normal mood and affect.  Nursing note and vitals reviewed.   BP 118/76 (BP Location: Left Arm, Patient Position: Sitting, Cuff Size: Large)   Pulse 85   Temp 98 F (36.7 C) (Oral)   Resp 18  Wt 281 lb 9.6 oz (127.7 kg)   SpO2 97%   BMI 46.86 kg/m  Wt Readings from Last 3 Encounters:  03/17/18 281 lb 9.6 oz (127.7 kg)  02/14/18 287 lb 8 oz (130.4 kg)  11/29/17 286 lb (129.7 kg)     Lab Results  Component Value Date   WBC 6.6 01/10/2018   HGB 12.5 01/10/2018   HCT 36.4 01/10/2018   PLT 242.0 01/10/2018   GLUCOSE 114 (H) 01/10/2018   CHOL 161 01/10/2018   TRIG 67.0 01/10/2018   HDL 45.10 01/10/2018   LDLCALC 103 (H) 01/10/2018   ALT 11 01/10/2018   AST 16 01/10/2018   NA 140 01/10/2018   K 4.8 01/10/2018   CL 102 01/10/2018   CREATININE 1.07 01/10/2018   BUN 27 (H) 01/10/2018   CO2 32 01/10/2018   TSH 0.62 01/10/2018   HGBA1C 5.8 01/10/2018   MICROALBUR 1.17 01/02/2009    Lab Results  Component Value Date   TSH 0.62 01/10/2018   Lab Results  Component Value Date   WBC 6.6 01/10/2018   HGB 12.5 01/10/2018   HCT 36.4 01/10/2018   MCV 88.7 01/10/2018   PLT 242.0 01/10/2018   Lab Results  Component Value Date   NA 140 01/10/2018   K 4.8 01/10/2018   CO2 32 01/10/2018   GLUCOSE 114 (H) 01/10/2018   BUN 27 (H) 01/10/2018   CREATININE 1.07 01/10/2018   BILITOT 0.4 01/10/2018   ALKPHOS 87 01/10/2018   AST 16 01/10/2018   ALT 11 01/10/2018   PROT 7.7 01/10/2018   ALBUMIN 4.0 01/10/2018   CALCIUM 10.4 01/10/2018   CALCIUM 10.2 01/10/2018   GFR 66.61 01/10/2018   Lab Results  Component Value Date   CHOL 161 01/10/2018   Lab Results  Component Value Date   HDL 45.10 01/10/2018   Lab Results  Component Value Date   LDLCALC 103 (H) 01/10/2018   Lab Results  Component Value Date   TRIG 67.0 01/10/2018   Lab Results   Component Value Date   CHOLHDL 4 01/10/2018   Lab Results  Component Value Date   HGBA1C 5.8 01/10/2018       Assessment & Plan:   Problem List Items Addressed This Visit    Essential hypertension    Well controlled, no changes to meds. Encouraged heart healthy diet such as the DASH diet and exercise as tolerated.       Colon cancer screening   Relevant Orders   Ambulatory referral to Gastroenterology   Post-menopausal bleeding    One recent episode. Referred to GYN fo further work up.       Relevant Orders   Ambulatory referral to Obstetrics / Gynecology   Dysuria - Primary    Started on Cefdinir and probiotics, pyridium prn. cultue sent.       Relevant Orders   POCT Urinalysis Dipstick (Automated) (Completed)   Urine Culture      I am having Sydney Pace start on cefdinir and phenazopyridine. I am also having her maintain her Cholecalciferol (VITAMIN D PO), multivitamin, Docusate Calcium (STOOL SOFTENER PO), cetirizine, calcium carbonate, vitamin C, meloxicam, gabapentin, sertraline, hyoscyamine, methylphenidate, lisinopril-hydrochlorothiazide, furosemide, modafinil, oxybutynin, and buPROPion.  Meds ordered this encounter  Medications  . cefdinir (OMNICEF) 300 MG capsule    Sig: Take 1 capsule (300 mg total) by mouth 2 (two) times daily for 14 doses.    Dispense:  14 capsule    Refill:  0  . phenazopyridine (PYRIDIUM)  200 MG tablet    Sig: Take 1 tablet (200 mg total) by mouth 3 (three) times daily as needed for pain.    Dispense:  10 tablet    Refill:  0     Penni Homans, MD

## 2018-03-17 NOTE — Assessment & Plan Note (Signed)
One recent episode. Referred to GYN fo further work up.

## 2018-03-17 NOTE — Assessment & Plan Note (Signed)
Started on Cefdinir and probiotics, pyridium prn. cultue sent.

## 2018-03-17 NOTE — Assessment & Plan Note (Signed)
Well controlled, no changes to meds. Encouraged heart healthy diet such as the DASH diet and exercise as tolerated.  °

## 2018-03-17 NOTE — Patient Instructions (Addendum)
multistrain probiotic [employer] cranberry tabs Urinary Tract Infection, Adult A urinary tract infection (UTI) is an infection of any part of the urinary tract, which includes the kidneys, ureters, bladder, and urethra. These organs make, store, and get rid of urine in the body. UTI can be a bladder infection (cystitis) or kidney infection (pyelonephritis). What are the causes? This infection may be caused by fungi, viruses, or bacteria. Bacteria are the most common cause of UTIs. This condition can also be caused by repeated incomplete emptying of the bladder during urination. What increases the risk? This condition is more likely to develop if:  You ignore your need to urinate or hold urine for long periods of time.  You do not empty your bladder completely during urination.  You wipe back to front after urinating or having a bowel movement, if you are female.  You are uncircumcised, if you are female.  You are constipated.  You have a urinary catheter that stays in place (indwelling).  You have a weak defense (immune) system.  You have a medical condition that affects your bowels, kidneys, or bladder.  You have diabetes.  You take antibiotic medicines frequently or for long periods of time, and the antibiotics no longer work well against certain types of infections (antibiotic resistance).  You take medicines that irritate your urinary tract.  You are exposed to chemicals that irritate your urinary tract.  You are female.  What are the signs or symptoms? Symptoms of this condition include:  Fever.  Frequent urination or passing small amounts of urine frequently.  Needing to urinate urgently.  Pain or burning with urination.  Urine that smells bad or unusual.  Cloudy urine.  Pain in the lower abdomen or back.  Trouble urinating.  Blood in the urine.  Vomiting or being less hungry than normal.  Diarrhea or abdominal pain.  Vaginal discharge, if you are  female.  How is this diagnosed? This condition is diagnosed with a medical history and physical exam. You will also need to provide a urine sample to test your urine. Other tests may be done, including:  Blood tests.  Sexually transmitted disease (STD) testing.  If you have had more than one UTI, a cystoscopy or imaging studies may be done to determine the cause of the infections. How is this treated? Treatment for this condition often includes a combination of two or more of the following:  Antibiotic medicine.  Other medicines to treat less common causes of UTI.  Over-the-counter medicines to treat pain.  Drinking enough water to stay hydrated.  Follow these instructions at home:  Take over-the-counter and prescription medicines only as told by your health care provider.  If you were prescribed an antibiotic, take it as told by your health care provider. Do not stop taking the antibiotic even if you start to feel better.  Avoid alcohol, caffeine, tea, and carbonated beverages. They can irritate your bladder.  Drink enough fluid to keep your urine clear or pale yellow.  Keep all follow-up visits as told by your health care provider. This is important.  Make sure to: ? Empty your bladder often and completely. Do not hold urine for long periods of time. ? Empty your bladder before and after sex. ? Wipe from front to back after a bowel movement if you are female. Use each tissue one time when you wipe. Contact a health care provider if:  You have back pain.  You have a fever.  You feel nauseous or  vomit.  Your symptoms do not get better after 3 days.  Your symptoms go away and then return. Get help right away if:  You have severe back pain or lower abdominal pain.  You are vomiting and cannot keep down any medicines or water. This information is not intended to replace advice given to you by your health care provider. Make sure you discuss any questions you have  with your health care provider. Document Released: 02/25/2005 Document Revised: 10/30/2015 Document Reviewed: 04/08/2015 Elsevier Interactive Patient Education  [employer].

## 2018-03-25 ENCOUNTER — Ambulatory Visit: Payer: Medicare HMO | Admitting: Gynecology

## 2018-03-25 ENCOUNTER — Encounter: Payer: Self-pay | Admitting: Gynecology

## 2018-03-25 VITALS — BP 124/86

## 2018-03-25 DIAGNOSIS — Z124 Encounter for screening for malignant neoplasm of cervix: Secondary | ICD-10-CM

## 2018-03-25 DIAGNOSIS — N898 Other specified noninflammatory disorders of vagina: Secondary | ICD-10-CM

## 2018-03-25 DIAGNOSIS — N950 Postmenopausal bleeding: Secondary | ICD-10-CM | POA: Diagnosis not present

## 2018-03-25 LAB — WET PREP FOR TRICH, YEAST, CLUE

## 2018-03-25 MED ORDER — METRONIDAZOLE 500 MG PO TABS: 500.0000 mg | ORAL_TABLET | Freq: Two times a day (BID) | ORAL | 0 refills | Status: DC

## 2018-03-25 NOTE — Patient Instructions (Addendum)
Take the antibiotic pill twice daily for 7 days.  Follow up for the ultrasound as scheduled

## 2018-03-25 NOTE — Progress Notes (Signed)
    Sydney Pace 1956/01/14 [id number]        62 y.o.  G3P0030 presents complaining of vaginal bleeding 2 weeks ago.  Has not had bleeding for years.  Had cramping associated with the bleeding but no premenstrual symptoms.  Lasted 1 to 2 days.  Past history of endometrial polyps status post hysteroscopy's in the past.  Past medical history,surgical history, problem list, medications, allergies, family history and social history were all reviewed and documented in the EPIC chart.  Directed ROS with pertinent positives and negatives documented in the history of present illness/assessment and plan.  Exam: Caryn Bee assistant Vitals:   03/25/18 1040  BP: 124/86   General appearance:  Normal Abdomen soft nontender without masses guarding rebound Pelvic external BUS vagina with whitish discharge.  No evidence of bleeding.  Cervix normal.  Uterus unable to palpate secondary to abdominal girth.  No gross masses or tenderness on bimanual.  Rectal exam is normal.  Assessment/Plan:  62 y.o. G3P0030 with postmenopausal bleeding.  History of polyps in the past.  Discharge seems a little heavier and on questioning she notes some vaginal irritation.  No real discharge or itching.  Wet prep today is unremarkable.  I am going to cover her for low-grade bacterial vaginosis with Flagyl 500 mg twice daily x7 days.  Discussed differential for her bleeding to include atrophic changes, polyps, hyperplasia and cancer.  Recommend sonohysterogram for endometrial assessment and patient will schedule in follow-up for this.    Anastasio Auerbach MD, 11:01 AM 03/25/2018

## 2018-03-25 NOTE — Addendum Note (Signed)
Addended by: Nelva Nay on: 03/25/2018 11:11 AM   Modules accepted: Orders

## 2018-03-28 LAB — PAP IG W/ RFLX HPV ASCU

## 2018-04-04 ENCOUNTER — Other Ambulatory Visit: Payer: Self-pay | Admitting: Gynecology

## 2018-04-04 DIAGNOSIS — N950 Postmenopausal bleeding: Secondary | ICD-10-CM

## 2018-04-06 ENCOUNTER — Other Ambulatory Visit (INDEPENDENT_AMBULATORY_CARE_PROVIDER_SITE_OTHER): Payer: Self-pay | Admitting: Orthopedic Surgery

## 2018-04-06 ENCOUNTER — Ambulatory Visit (INDEPENDENT_AMBULATORY_CARE_PROVIDER_SITE_OTHER): Payer: Medicare HMO | Admitting: Orthopedic Surgery

## 2018-04-06 ENCOUNTER — Ambulatory Visit: Payer: Self-pay | Admitting: *Deleted

## 2018-04-06 ENCOUNTER — Ambulatory Visit (INDEPENDENT_AMBULATORY_CARE_PROVIDER_SITE_OTHER): Payer: Medicare HMO

## 2018-04-06 ENCOUNTER — Ambulatory Visit: Payer: Medicare HMO | Admitting: Family Medicine

## 2018-04-06 ENCOUNTER — Encounter (INDEPENDENT_AMBULATORY_CARE_PROVIDER_SITE_OTHER): Payer: Self-pay | Admitting: Orthopedic Surgery

## 2018-04-06 ENCOUNTER — Encounter: Payer: Self-pay | Admitting: Family Medicine

## 2018-04-06 VITALS — BP 144/88 | HR 76 | Temp 98.1°F | Ht 65.0 in | Wt 282.4 lb

## 2018-04-06 DIAGNOSIS — M25531 Pain in right wrist: Secondary | ICD-10-CM

## 2018-04-06 DIAGNOSIS — S62101A Fracture of unspecified carpal bone, right wrist, initial encounter for closed fracture: Secondary | ICD-10-CM

## 2018-04-06 DIAGNOSIS — S52571A Other intraarticular fracture of lower end of right radius, initial encounter for closed fracture: Secondary | ICD-10-CM | POA: Diagnosis not present

## 2018-04-06 DIAGNOSIS — S52501A Unspecified fracture of the lower end of right radius, initial encounter for closed fracture: Secondary | ICD-10-CM | POA: Diagnosis not present

## 2018-04-06 MED ORDER — HYDROCODONE-ACETAMINOPHEN 5-325 MG PO TABS: 1.0000 | ORAL_TABLET | Freq: Four times a day (QID) | ORAL | 0 refills | Status: DC | PRN

## 2018-04-06 NOTE — Telephone Encounter (Signed)
FYI seeing you today 

## 2018-04-06 NOTE — Telephone Encounter (Signed)
Pt called because she fell and hit her head on 04/05/18; the pt says that she fell on a table on 04/05/18; the fall was from the height of about 1 yard; she says that she took 1/2 gabapentin for sleep she is not sure if she balanced herself; she reports soreness on her head 04/05/18; she also reports right hand swelling that started on 04/05/18; she is most concerned about her hand because she can barely use it (wrist to hand); she says that she can wiggle her fingers but it is painful; she denies dizziness; recommendations made per nurse triage protocol;the pt requests an appointment today; she is normally seen by Dr Charlett Blake, [facility], but there is no availability in that office today; she would like to be seen at [facility]; offered pt opportunity to see sports medicine due to the mechanism of injury but she declined; pt offered and accepted appointment with Dr Orma Flaming, [facility], 04/06/18 at 1140; she verbalized understanding; will route to office for notification of this upcoming appointment.       Reason for Disposition . Can't use injured hand normally (e.g., make a fist, open fully, hold a glass of water)  Answer Assessment - Initial Assessment Questions 1. MECHANISM: "How did the injury happen?"     04/05/18 at 1030 2. ONSET: "When did the injury happen?" (Minutes or hours ago)    hours 3. APPEARANCE of INJURY: "What does the injury look like?"      Bruising; skin intact 4. SEVERITY: "Can you use the hand normally?" "Can you bend your fingers into a ball and then fully open them?"     No; pain when forming a ball; wrist is swollen and tight 5. SIZE: For cuts, bruises, or swelling, ask: "How large is it?" (e.g., inches or centimeters;  entire hand or wrist)      From hand to forearm 6. PAIN: "Is there pain?" If so, ask: "How bad is the pain?"  (Scale 1-10; or mild, moderate, severe)     Moderate when not moving it; severe with movement 7. TETANUS: For any breaks in the  skin, ask: "When was the last tetanus booster?"     n/a 8. OTHER SYMPTOMS: "Do you have any other symptoms?"      Warmth to the area; bruising 9. PREGNANCY: "Is there any chance you are pregnant?" "When was your last menstrual period?"     no  Protocols used: HAND AND WRIST INJURY-A-AH

## 2018-04-06 NOTE — Progress Notes (Signed)
Office Visit Note   Patient: Sydney Pace           Date of Birth: 09/29/1955           MRN: [id number] Visit Date: 04/06/2018 Requested by: [name], MD [address] [place], [place] [address] PCP: [name], MD  Subjective: Chief Complaint  Patient presents with  . Right Wrist - Injury    HPI: Patient presents with right wrist fracture.  Date of injury 04/05/2018.  She fell on her outstretched arm.  She is right-hand dominant.  She does have a history of MS.  She denies any other orthopedic complaints.              ROS: All systems reviewed are negative as they relate to the chief complaint within the history of present illness.  Patient denies  fevers or chills.   Assessment & Plan: Visit Diagnoses:  1. Other closed intra-articular fracture of distal end of right radius, initial encounter     Plan: Impression is displaced closed comminuted intra-articular distal radius fracture.  There is loss of height and inclination.  Plan is open reduction internal fixation.  I think in the long run she would do better with range of motion and grip strength to have this fixed.  The risk and benefits of fixation are discussed including but not limited to infection nerve vessel damage as well as wrist stiffness.  Patient understands risk benefits.  Her incision will have to be slightly longer than usual due to proximal extension of the fracture to the shaft.  Follow-Up Instructions: Return in about 1 week (around 04/13/2018).   Orders:  No orders of the defined types were placed in this encounter.  No orders of the defined types were placed in this encounter.     Procedures: No procedures performed   Clinical Data: No additional findings.  Objective: Vital Signs: LMP  (LMP Unknown)   Physical Exam:   Constitutional: Patient appears well-developed HEENT:  Head: Normocephalic Eyes:EOM are normal Neck: Normal range of motion Cardiovascular: Normal  rate Pulmonary/chest: Effort normal Neurologic: Patient is alert Skin: Skin is warm Psychiatric: Patient has normal mood and affect    Ortho Exam: Ortho exam demonstrates swelling in the right wrist region but no paresthesias.  The hand is perfused.  Fingers are mobile.  Right elbow range of motion is intact.  Specialty Comments:  No specialty comments available.  Imaging: Dg Wrist Complete Right  Result Date: 04/06/2018 CLINICAL DATA:  Fall.  Severe pain right wrist. EXAM: RIGHT WRIST - COMPLETE 3+ VIEW COMPARISON:  No recent. FINDINGS: Comminuted, displaced, overriding fracture of the distal radial metaphysis is noted. Fracture extension into the radiocarpal joint space appears to be present. Diffuse degenerative change. IMPRESSION: Complex fracture of the distal right radius. Electronically Signed   By: Marcello Moores  Register   On: 04/06/2018 12:03     PMFS History: Patient Active Problem List   Diagnosis Date Noted  . Post-menopausal bleeding 03/17/2018  . Dysuria 03/17/2018  . High serum parathyroid hormone (PTH) 01/10/2018  . Hypercalcemia 01/10/2018  . Chronic heel pain, left 07/06/2017  . Arthritis 07/06/2017  . Osteopenia 07/06/2017  . Hx of colonic polyp 07/06/2017  . Cervical cancer screening 07/06/2017  . Obesity 07/06/2017  . Dry eyes, bilateral 10/10/2015  . Low-tension glaucoma of both eyes, moderate stage 10/10/2015  . Nuclear sclerotic cataract of both eyes 10/10/2015  . Partial optic atrophy of left eye 10/10/2015  .  Insomnia 08/16/2014  . Optic neuritis 08/16/2014  . Ataxic gait 08/16/2014  . Other fatigue 08/16/2014  . Abdominal pain, chronic, epigastric 04/03/2014  . Colon cancer screening 04/03/2014  . Helicobacter pylori (H. pylori) infection 03/21/2014  . Preventative health care 08/27/2013  . Sinusitis 08/08/2012  . Hyperlipidemia, mixed 08/08/2012  . Allergic state 08/08/2012  . Esophageal reflux 08/08/2012  . Depression 01/23/2011  . Leg swelling  01/23/2011  . Multiple sclerosis ([place]) 09/23/2010  . Essential hypertension 09/23/2010  . Overactive bladder 09/23/2010  . Thyroid nodule 09/23/2010  . Pulmonary nodule 09/23/2010  . Fatigue 09/23/2010   Past Medical History:  Diagnosis Date  . Arthritis   . Arthritis 07/06/2017  . Dehydration 06/09/2016  . Depression   . Esophageal reflux 08/08/2012  . GERD (gastroesophageal reflux disease)   . Hypertension   . MS (multiple sclerosis) ([place])   . Other and unspecified hyperlipidemia 08/08/2012  . Thyroid disease     Family History  Problem Relation Age of Onset  . Heart disease Mother        pacemaker  . Emphysema Mother   . Hypertension Mother   . Heart disease Father   . Diabetes Father   . Heart disease Sister        cad  . Sleep apnea Brother   . Colon cancer Neg Hx   . Esophageal cancer Neg Hx   . Rectal cancer Neg Hx   . Stomach cancer Neg Hx     Past Surgical History:  Procedure Laterality Date  . HYSTEROSCOPY  02, 04, 2008   with D&C  . TMJ ARTHROSCOPY    . TONSILLECTOMY     Social History   Occupational History  . Not on file  Tobacco Use  . Smoking status: Never Smoker  . Smokeless tobacco: Never Used  Substance and Sexual Activity  . Alcohol use: Yes    Alcohol/week: 0.0 standard drinks    Comment: occassional  . Drug use: No  . Sexual activity: Never    Comment: 1st intercourse 13 yo-5 partners

## 2018-04-06 NOTE — Progress Notes (Signed)
Patient: Sydney Pace MRN: [id number] DOB: [birth year] PCP: [name], MD     Subjective:  Chief Complaint  Patient presents with  . Wrist Pain    HPI: The patient is a 62 y.o. female who presents today for right wrist pain. She fell yesterday and she thinks she fell on an outstretched arm. She states it was so swollen yesterday.  She had taken some gabapentin and she thinks it made her drowsy. She also has MS, but states she has had MS since 1978 and had only had 2 falls. It was around 10:30 am and when she got up out of bed she was dizzy and she thinks her arm hit the side table. She also hit her head, but she states it was really mild.denies any confusion, dizziness, or vision changes.  Yesterday she iced her wrist, heat, and took aleve. The pain is quite bad. Pain is 10/10. Pain extends into her thumb, but not her fingers. She is right handed. She did drive herself to this appointment. She can move her fingers and has color to all digits.   Review of Systems  Musculoskeletal: Positive for arthralgias and myalgias.       Right wrist injury, pain and swelling  Neurological: Positive for headaches. Negative for dizziness.    Allergies Patient has No Known Allergies.  Past Medical History Patient  has a past medical history of Arthritis, Arthritis (07/06/2017), Dehydration (06/09/2016), Depression, Esophageal reflux (08/08/2012), GERD (gastroesophageal reflux disease), Hypertension, MS (multiple sclerosis) ([place]), Other and unspecified hyperlipidemia (08/08/2012), and Thyroid disease.  Surgical History Patient  has a past surgical history that includes Tonsillectomy; TMJ Arthroscopy; and Hysteroscopy (02, 04, 2008).  Family History Pateint's family history includes Diabetes in her father; Emphysema in her mother; Heart disease in her father, mother, and sister; Hypertension in her mother; Sleep apnea in her brother.  Social History Patient  reports that she has never smoked. She has  never used smokeless tobacco. She reports that she drinks alcohol. She reports that she does not use drugs.    Objective: Vitals:   04/06/18 1202  BP: (!) 144/88  Pulse: 76  Temp: 98.1 F (36.7 C)  TempSrc: Oral  SpO2: 98%  Weight: 282 lb 6.4 oz (128.1 kg)  Height: 5\' 5"  (1.651 m)    Body mass index is 46.99 kg/m.  Physical Exam  Constitutional: She appears well-developed and well-nourished.  Musculoskeletal:  Right wrist: extensive edema over wrist extending into digits and proximally up forearm. Ecchymosis on ventral side. Radial pulse palpated. Color/perfusion in digits. Can close fingers, but no real grip. Pain upon palpation and can not really move her wrist in any direction.    Vitals reviewed.      Right wrist: comminuted, displaced. Complex fracture of right wrist.   Assessment/plan: 1. Wrist pain, acute, right See below.  - DG Wrist Complete Right; Future  2. Closed fracture dislocation of right wrist, initial encounter Complex fracture. Stat referral to ortho, surgery likely needed. Will have sports med (dr. Paulla Fore) splint her here. Talked to Dr. Erlinda Hong who recommended splinting, but he had no access to films. Will be seen by ortho in 1 hour. norco given, but drowsy/fall precautions given as well. Gave her #15 of 5/325mg . She will have her sister in law come and take her to appointment.  - Ambulatory referral to Orthopedic Surgery   >25 minutes spent in counseling and coordination of care.    Return if symptoms worsen or fail to improve.  Orma Flaming, MD [place]  04/06/2018

## 2018-04-06 NOTE — Progress Notes (Signed)
Pre-op instructions were provided only according to checklist; please complete pt assessment on DOS. Pt made aware to stop taking vitamins, fish oil and herbal medications. Do not take any NSAIDs ie: Ibuprofen, Advil, Naproxen ( Aleve), Motrin, BC and Goody Powder or any medication containing Aspirin. Pt denies SOB, chest pain, and being under the care of a cardiologist. Pt denies having a stress test , echo and cardiac cath. Pt denies having an EKG and chest x ray within them last year. Pt denies recent labs. Pt verbalized understanding of all pre-op instructions.

## 2018-04-07 ENCOUNTER — Other Ambulatory Visit: Payer: Self-pay

## 2018-04-07 ENCOUNTER — Ambulatory Visit (HOSPITAL_COMMUNITY): Payer: Medicare HMO | Admitting: Anesthesiology

## 2018-04-07 ENCOUNTER — Encounter (HOSPITAL_COMMUNITY): Payer: Self-pay | Admitting: Certified Registered Nurse Anesthetist

## 2018-04-07 ENCOUNTER — Encounter (HOSPITAL_COMMUNITY)
Admission: RE | Disposition: A | Discharge status: Home / Self Care | Payer: Self-pay | Source: Ambulatory Visit | Attending: Orthopedic Surgery

## 2018-04-07 ENCOUNTER — Ambulatory Visit (HOSPITAL_COMMUNITY)
Admission: RE | Admit: 2018-04-07 | Discharge: 2018-04-07 | Disposition: A | Discharge status: Home / Self Care | Payer: Medicare HMO | Source: Ambulatory Visit | Attending: Orthopedic Surgery | Admitting: Orthopedic Surgery

## 2018-04-07 DIAGNOSIS — M8580 Other specified disorders of bone density and structure, unspecified site: Secondary | ICD-10-CM | POA: Diagnosis not present

## 2018-04-07 DIAGNOSIS — S52571A Other intraarticular fracture of lower end of right radius, initial encounter for closed fracture: Secondary | ICD-10-CM | POA: Diagnosis not present

## 2018-04-07 DIAGNOSIS — S62101A Fracture of unspecified carpal bone, right wrist, initial encounter for closed fracture: Secondary | ICD-10-CM

## 2018-04-07 DIAGNOSIS — E7849 Other hyperlipidemia: Secondary | ICD-10-CM | POA: Diagnosis not present

## 2018-04-07 DIAGNOSIS — G35 Multiple sclerosis: Secondary | ICD-10-CM | POA: Insufficient documentation

## 2018-04-07 DIAGNOSIS — F329 Major depressive disorder, single episode, unspecified: Secondary | ICD-10-CM | POA: Diagnosis not present

## 2018-04-07 DIAGNOSIS — W19XXXA Unspecified fall, initial encounter: Secondary | ICD-10-CM | POA: Diagnosis not present

## 2018-04-07 DIAGNOSIS — K219 Gastro-esophageal reflux disease without esophagitis: Secondary | ICD-10-CM | POA: Diagnosis not present

## 2018-04-07 DIAGNOSIS — R69 Illness, unspecified: Secondary | ICD-10-CM | POA: Diagnosis not present

## 2018-04-07 DIAGNOSIS — I10 Essential (primary) hypertension: Secondary | ICD-10-CM | POA: Insufficient documentation

## 2018-04-07 DIAGNOSIS — Z79899 Other long term (current) drug therapy: Secondary | ICD-10-CM | POA: Diagnosis not present

## 2018-04-07 DIAGNOSIS — G8918 Other acute postprocedural pain: Secondary | ICD-10-CM | POA: Diagnosis not present

## 2018-04-07 HISTORY — PX: ORIF WRIST FRACTURE: SHX2133

## 2018-04-07 LAB — CBC
HEMATOCRIT: 38.2 % (ref 36.0–46.0)
Hemoglobin: 12.3 g/dL (ref 12.0–15.0)
MCH: 29.6 pg (ref 26.0–34.0)
MCHC: 32.2 g/dL (ref 30.0–36.0)
MCV: 91.8 fL (ref 80.0–100.0)
PLATELETS: 250 10*3/uL (ref 150–400)
RBC: 4.16 MIL/uL (ref 3.87–5.11)
RDW: 14.6 % (ref 11.5–15.5)
WBC: 7.9 10*3/uL (ref 4.0–10.5)
nRBC: 0 % (ref 0.0–0.2)

## 2018-04-07 LAB — BASIC METABOLIC PANEL
Anion gap: 11 (ref 5–15)
BUN: 13 mg/dL (ref 8–23)
CALCIUM: 9.8 mg/dL (ref 8.9–10.3)
CHLORIDE: 104 mmol/L (ref 98–111)
CO2: 22 mmol/L (ref 22–32)
CREATININE: 1.08 mg/dL — AB (ref 0.44–1.00)
GFR calc Af Amer: >60 mL/min (ref >60)
GFR, EST NON AFRICAN AMERICAN: 53 mL/min — AB (ref >60)
Glucose, Bld: 91 mg/dL (ref 70–99)
Potassium: 3.7 mmol/L (ref 3.5–5.1)
SODIUM: 137 mmol/L (ref 135–145)

## 2018-04-07 SURGERY — OPEN REDUCTION INTERNAL FIXATION (ORIF) WRIST FRACTURE
Anesthesia: General | Laterality: Right

## 2018-04-07 MED ORDER — LACTATED RINGERS IV SOLN
INTRAVENOUS | Status: DC
  Administered 2018-04-07: 14:00:00 via INTRAVENOUS

## 2018-04-07 MED ORDER — LIDOCAINE 2% (20 MG/ML) 5 ML SYRINGE
INTRAMUSCULAR | Status: AC
  Filled 2018-04-07: qty 5

## 2018-04-07 MED ORDER — CHLORHEXIDINE GLUCONATE 4 % EX LIQD: 60.0000 mL | Freq: Once | CUTANEOUS | Status: DC

## 2018-04-07 MED ORDER — FENTANYL CITRATE (PF) 100 MCG/2ML IJ SOLN
INTRAMUSCULAR | Status: AC
  Administered 2018-04-07: 50 ug via INTRAVENOUS
  Filled 2018-04-07: qty 2

## 2018-04-07 MED ORDER — OXYCODONE HCL 5 MG PO TABS
ORAL_TABLET | ORAL | Status: AC
  Filled 2018-04-07: qty 1

## 2018-04-07 MED ORDER — CEFAZOLIN SODIUM-DEXTROSE 2-4 GM/100ML-% IV SOLN
INTRAVENOUS | Status: AC
  Filled 2018-04-07: qty 100

## 2018-04-07 MED ORDER — PROPOFOL 10 MG/ML IV BOLUS
INTRAVENOUS | Status: DC | PRN
  Administered 2018-04-07: 160 mg via INTRAVENOUS

## 2018-04-07 MED ORDER — BUPIVACAINE HCL (PF) 0.5 % IJ SOLN
INTRAMUSCULAR | Status: DC | PRN
  Administered 2018-04-07: 15 mL via PERINEURAL

## 2018-04-07 MED ORDER — OXYCODONE HCL 5 MG PO TABS
5.0000 mg | ORAL_TABLET | Freq: Once | ORAL | Status: AC | PRN
  Administered 2018-04-07: 5 mg via ORAL

## 2018-04-07 MED ORDER — MIDAZOLAM HCL 2 MG/2ML IJ SOLN
INTRAMUSCULAR | Status: AC
  Administered 2018-04-07: 1 mg via INTRAVENOUS
  Filled 2018-04-07: qty 2

## 2018-04-07 MED ORDER — 0.9 % SODIUM CHLORIDE (POUR BTL) OPTIME
TOPICAL | Status: DC | PRN
  Administered 2018-04-07: 1000 mL

## 2018-04-07 MED ORDER — MIDAZOLAM HCL 2 MG/2ML IJ SOLN
INTRAMUSCULAR | Status: AC
  Filled 2018-04-07: qty 2

## 2018-04-07 MED ORDER — PROPOFOL 10 MG/ML IV BOLUS
INTRAVENOUS | Status: AC
  Filled 2018-04-07: qty 20

## 2018-04-07 MED ORDER — FENTANYL CITRATE (PF) 100 MCG/2ML IJ SOLN
INTRAMUSCULAR | Status: AC
  Filled 2018-04-07: qty 2

## 2018-04-07 MED ORDER — ONDANSETRON HCL 4 MG/2ML IJ SOLN
INTRAMUSCULAR | Status: DC | PRN
  Administered 2018-04-07: 4 mg via INTRAVENOUS

## 2018-04-07 MED ORDER — FENTANYL CITRATE (PF) 100 MCG/2ML IJ SOLN
50.0000 ug | Freq: Once | INTRAMUSCULAR | Status: AC
  Administered 2018-04-07: 50 ug via INTRAVENOUS

## 2018-04-07 MED ORDER — PROMETHAZINE HCL 25 MG/ML IJ SOLN: 6.2500 mg | INTRAMUSCULAR | Status: DC | PRN

## 2018-04-07 MED ORDER — LIDOCAINE 2% (20 MG/ML) 5 ML SYRINGE
INTRAMUSCULAR | Status: DC | PRN
  Administered 2018-04-07: 80 mg via INTRAVENOUS

## 2018-04-07 MED ORDER — OXYCODONE HCL 5 MG/5ML PO SOLN: 5.0000 mg | Freq: Once | ORAL | Status: AC | PRN

## 2018-04-07 MED ORDER — ROCURONIUM BROMIDE 50 MG/5ML IV SOSY
PREFILLED_SYRINGE | INTRAVENOUS | Status: DC | PRN
  Administered 2018-04-07: 50 mg via INTRAVENOUS

## 2018-04-07 MED ORDER — FENTANYL CITRATE (PF) 250 MCG/5ML IJ SOLN
INTRAMUSCULAR | Status: AC
  Filled 2018-04-07: qty 5

## 2018-04-07 MED ORDER — FENTANYL CITRATE (PF) 100 MCG/2ML IJ SOLN
25.0000 ug | INTRAMUSCULAR | Status: DC | PRN
  Administered 2018-04-07 (×2): 50 ug via INTRAVENOUS

## 2018-04-07 MED ORDER — SUGAMMADEX SODIUM 200 MG/2ML IV SOLN
INTRAVENOUS | Status: DC | PRN
  Administered 2018-04-07: 200 mg via INTRAVENOUS

## 2018-04-07 MED ORDER — DEXTROSE 5 % IV SOLN
3.0000 g | INTRAVENOUS | Status: AC
  Administered 2018-04-07: 3 g via INTRAVENOUS
  Filled 2018-04-07: qty 3

## 2018-04-07 MED ORDER — MIDAZOLAM HCL 2 MG/2ML IJ SOLN
1.0000 mg | Freq: Once | INTRAMUSCULAR | Status: AC
  Administered 2018-04-07: 1 mg via INTRAVENOUS

## 2018-04-07 MED ORDER — FENTANYL CITRATE (PF) 100 MCG/2ML IJ SOLN
INTRAMUSCULAR | Status: DC | PRN
  Administered 2018-04-07: 100 ug via INTRAVENOUS

## 2018-04-07 MED ORDER — BUPIVACAINE LIPOSOME 1.3 % IJ SUSP
INTRAMUSCULAR | Status: DC | PRN
  Administered 2018-04-07: 10 mL via PERINEURAL

## 2018-04-07 MED ORDER — ROCURONIUM BROMIDE 50 MG/5ML IV SOSY
PREFILLED_SYRINGE | INTRAVENOUS | Status: AC
  Filled 2018-04-07: qty 5

## 2018-04-07 MED ORDER — DEXAMETHASONE SODIUM PHOSPHATE 10 MG/ML IJ SOLN
INTRAMUSCULAR | Status: DC | PRN
  Administered 2018-04-07: 10 mg via INTRAVENOUS

## 2018-04-07 SURGICAL SUPPLY — 74 items
BANDAGE ACE 4X5 VEL STRL LF (GAUZE/BANDAGES/DRESSINGS) ×2 IMPLANT
BIT DRILL 2.2 SS TIBIAL (BIT) ×1 IMPLANT
BLADE SURG 10 STRL SS (BLADE) ×2 IMPLANT
BNDG CMPR 9X4 STRL LF SNTH (GAUZE/BANDAGES/DRESSINGS)
BNDG ESMARK 4X9 LF (GAUZE/BANDAGES/DRESSINGS) IMPLANT
BNDG GAUZE ELAST 4 BULKY (GAUZE/BANDAGES/DRESSINGS) ×1 IMPLANT
CORDS BIPOLAR (ELECTRODE) ×2 IMPLANT
COVER SURGICAL LIGHT HANDLE (MISCELLANEOUS) ×2 IMPLANT
COVER WAND RF STERILE (DRAPES) ×2 IMPLANT
CUFF TOURNIQUET SINGLE 18IN (TOURNIQUET CUFF) ×2 IMPLANT
CUFF TOURNIQUET SINGLE 24IN (TOURNIQUET CUFF) IMPLANT
DRAIN TLS ROUND 10FR (DRAIN) IMPLANT
DRAPE INCISE IOBAN 66X45 STRL (DRAPES) IMPLANT
DRAPE OEC MINIVIEW 54X84 (DRAPES) IMPLANT
DRAPE U-SHAPE 47X51 STRL (DRAPES) ×2 IMPLANT
DRSG PAD ABDOMINAL 8X10 ST (GAUZE/BANDAGES/DRESSINGS) IMPLANT
DURAPREP 26ML APPLICATOR (WOUND CARE) ×2 IMPLANT
ELECT REM PT RETURN 9FT ADLT (ELECTROSURGICAL) ×2
ELECTRODE REM PT RTRN 9FT ADLT (ELECTROSURGICAL) ×1 IMPLANT
FACESHIELD WRAPAROUND (MASK) ×2 IMPLANT
FACESHIELD WRAPAROUND OR TEAM (MASK) ×1 IMPLANT
GAUZE SPONGE 4X4 12PLY STRL (GAUZE/BANDAGES/DRESSINGS) IMPLANT
GAUZE SPONGE 4X4 12PLY STRL LF (GAUZE/BANDAGES/DRESSINGS) ×1 IMPLANT
GAUZE XEROFORM 1X8 LF (GAUZE/BANDAGES/DRESSINGS) ×1 IMPLANT
GLOVE BIO SURGEON ST LM GN SZ9 (GLOVE) ×2 IMPLANT
GLOVE BIOGEL PI IND STRL 8 (GLOVE) ×1 IMPLANT
GLOVE BIOGEL PI INDICATOR 8 (GLOVE) ×1
GLOVE SURG ORTHO 8.0 STRL STRW (GLOVE) ×2 IMPLANT
GOWN STRL REUS W/ TWL LRG LVL3 (GOWN DISPOSABLE) ×2 IMPLANT
GOWN STRL REUS W/ TWL XL LVL3 (GOWN DISPOSABLE) ×1 IMPLANT
GOWN STRL REUS W/TWL LRG LVL3 (GOWN DISPOSABLE) ×4
GOWN STRL REUS W/TWL XL LVL3 (GOWN DISPOSABLE) ×2
K-WIRE 1.6 (WIRE) ×6
K-WIRE FX5X1.6XNS BN SS (WIRE) ×3
KIT BASIN OR (CUSTOM PROCEDURE TRAY) ×2 IMPLANT
KIT TURNOVER KIT B (KITS) ×2 IMPLANT
KWIRE FX5X1.6XNS BN SS (WIRE) IMPLANT
MANIFOLD NEPTUNE II (INSTRUMENTS) ×2 IMPLANT
NEEDLE 22X1 1/2 (OR ONLY) (NEEDLE) IMPLANT
NS IRRIG 1000ML POUR BTL (IV SOLUTION) ×2 IMPLANT
PACK ORTHO EXTREMITY (CUSTOM PROCEDURE TRAY) ×2 IMPLANT
PAD ABD 8X10 STRL (GAUZE/BANDAGES/DRESSINGS) ×1 IMPLANT
PAD ARMBOARD 7.5X6 YLW CONV (MISCELLANEOUS) ×4 IMPLANT
PAD CAST 3X4 CTTN HI CHSV (CAST SUPPLIES) ×1 IMPLANT
PAD CAST 4YDX4 CTTN HI CHSV (CAST SUPPLIES) ×1 IMPLANT
PADDING CAST COTTON 3X4 STRL (CAST SUPPLIES) ×2
PADDING CAST COTTON 4X4 STRL (CAST SUPPLIES) ×2
PLATE MEDIUM DVR RIGHT (Plate) ×1 IMPLANT
SCREW LOCK 16X2.7X 3 LD TPR (Screw) IMPLANT
SCREW LOCK 18X2.7X 3 LD TPR (Screw) IMPLANT
SCREW LOCK 20X2.7X 3 LD TPR (Screw) IMPLANT
SCREW LOCK 22X2.7X 3 LD TPR (Screw) IMPLANT
SCREW LOCKING 2.7X15MM (Screw) ×1 IMPLANT
SCREW LOCKING 2.7X16 (Screw) ×8 IMPLANT
SCREW LOCKING 2.7X18 (Screw) ×6 IMPLANT
SCREW LOCKING 2.7X20MM (Screw) ×4 IMPLANT
SCREW LOCKING 2.7X22MM (Screw) ×4 IMPLANT
SLING ARM IMMOBILIZER LRG (SOFTGOODS) ×1 IMPLANT
SPONGE LAP 4X18 RFD (DISPOSABLE) ×4 IMPLANT
STAPLER VISISTAT 35W (STAPLE) IMPLANT
STRIP CLOSURE SKIN 1/2X4 (GAUZE/BANDAGES/DRESSINGS) ×2 IMPLANT
SUCTION FRAZIER HANDLE 10FR (MISCELLANEOUS) ×1
SUCTION TUBE FRAZIER 10FR DISP (MISCELLANEOUS) ×1 IMPLANT
SUT ETHILON 3 0 PS 1 (SUTURE) ×2 IMPLANT
SUT PROLENE 3 0 PS 1 (SUTURE) IMPLANT
SUT VIC AB 2-0 CTB1 (SUTURE) IMPLANT
SUT VIC AB 3-0 X1 27 (SUTURE) IMPLANT
SYR CONTROL 10ML LL (SYRINGE) IMPLANT
SYSTEM CHEST DRAIN TLS 7FR (DRAIN) IMPLANT
TOWEL OR 17X24 6PK STRL BLUE (TOWEL DISPOSABLE) ×2 IMPLANT
TOWEL OR 17X26 10 PK STRL BLUE (TOWEL DISPOSABLE) ×2 IMPLANT
TUBE CONNECTING 12X1/4 (SUCTIONS) ×2 IMPLANT
WATER STERILE IRR 1000ML POUR (IV SOLUTION) ×2 IMPLANT
YANKAUER SUCT BULB TIP NO VENT (SUCTIONS) IMPLANT

## 2018-04-07 NOTE — Anesthesia Procedure Notes (Signed)
Anesthesia Regional Block: Supraclavicular block   Pre-Anesthetic Checklist: ,, timeout performed, Correct Patient, Correct Site, Correct Laterality, Correct Procedure, Correct Position, site marked, Risks and benefits discussed,  Surgical consent,  Pre-op evaluation,  At surgeon's request and post-op pain management  Laterality: Right  Prep: chloraprep       Needles:  Injection technique: Single-shot  Needle Type: Echogenic Needle     Needle Length: 9cm  Needle Gauge: 21     Additional Needles:   Narrative:  Start time: 04/07/2018 2:28 PM End time: 04/07/2018 2:33 PM Injection made incrementally with aspirations every 5 mL.  Performed by: Personally  Anesthesiologist: Audry Pili, MD  Additional Notes: No pain on injection. No increased resistance to injection. Injection made in 5cc increments. Good needle visualization. Patient tolerated the procedure well.

## 2018-04-07 NOTE — Transfer of Care (Signed)
Immediate Anesthesia Transfer of Care Note  Patient: Sydney Pace  Procedure(s) Performed: RIGHT WRIST OPEN REDUCTION INTERNAL FIXATION (ORIF) (Right )  Patient Location: PACU  Anesthesia Type:GA combined with regional for post-op pain  Level of Consciousness: awake, alert  and patient cooperative  Airway & Oxygen Therapy: Patient Spontanous Breathing and Patient connected to nasal cannula oxygen  Post-op Assessment: Report given to RN and Post -op Vital signs reviewed and stable  Post vital signs: Reviewed and stable  Last Vitals:  Vitals Value Taken Time  BP 158/126 04/07/2018  5:03 PM  Temp    Pulse 83 04/07/2018  5:03 PM  Resp 19 04/07/2018  5:03 PM  SpO2 100 % 04/07/2018  5:03 PM  Vitals shown include unvalidated device data.  Last Pain:  Vitals:   04/07/18 1440  TempSrc:   PainSc: 0-No pain      Patients Stated Pain Goal: 1 (37/94/44 6190)  Complications: No apparent anesthesia complications

## 2018-04-07 NOTE — Anesthesia Procedure Notes (Signed)
Procedure Name: Intubation Date/Time: 04/07/2018 3:22 PM Performed by: White, Amedeo Plenty, CRNA Pre-anesthesia Checklist: Patient identified, Emergency Drugs available, Suction available and Patient being monitored Patient Re-evaluated:Patient Re-evaluated prior to induction Oxygen Delivery Method: Circle System Utilized Preoxygenation: Pre-oxygenation with 100% oxygen Induction Type: IV induction Ventilation: Mask ventilation without difficulty Laryngoscope Size: Glidescope and 3 Grade View: Grade I Tube type: Oral Tube size: 7.0 mm Number of attempts: 3 Airway Equipment and Method: Stylet and Oral airway Placement Confirmation: ETT inserted through vocal cords under direct vision,  positive ETCO2 and breath sounds checked- equal and bilateral Secured at: 22 cm Tube secured with: Tape Dental Injury: Teeth and Oropharynx as per pre-operative assessment  Comments: DL x1 by CRNA with MAC 3, grade IV view; DL x2 by MDA with Miller 2, grade IV view; VLx1 with Glidescope 3, grade I view; atraumatic intubation with oral 7.0 cuffed ETT.

## 2018-04-07 NOTE — Op Note (Signed)
NAME: Sydney, Pace MEDICAL RECORD [id number] ACCOUNT [id number] DATE OF BIRTH:12/07/1955 FACILITY: [facility] LOCATION: [facility] PHYSICIAN:GREGORY Randel Pigg, MD  OPERATIVE REPORT  DATE OF PROCEDURE:  04/07/2018  PREOPERATIVE DIAGNOSIS:  Intraarticular 3-part right distal radius fracture.  POSTOPERATIVE DIAGNOSIS:  Intraarticular 3-part right distal radius fracture.  PROCEDURE:  Open reduction internal fixation of right distal radius fracture using DVR Biomet plate and screws.  SURGEON:  Meredith Pel, MD  ASSISTANT:  April Green, RNFA  INDICATIONS:  This is a 61 year old patient with right distal radius fracture.  She presents for operative management after explanation of risks and benefits.  PROCEDURE IN DETAIL:  The patient was brought to the operating room where general anesthetic was induced.  Preoperative antibiotics administered.  Timeout was called.  Right arm was prescrubbed with alcohol and Betadine, allowed to air dry.  Prepped with  DuraPrep solution and draped in a sterile manner.  [name] was used to cover the operative field.  Timeout was called.  Arm was elevated examined and exsanguinated with the Esmarch wrap.  Tourniquet was inflated.  Total tourniquet time 15 minutes at 250  mmHg. An incision was made over the FCR tendon.  Skin and subcutaneous tissue were sharply divided.  The FCR tendon sheath was incised on both the volar and dorsal aspect.  Blunt dissection was taken down over the pronator quadratus, which was detached  from the distal radius.  Care was taken to avoid injury to neurovascular bundle, median nerve ulnarly, and radial artery radially.  The fracture was visualized.  The fracture was then manually reduced and applied.  Correct placement of the plate in the  AP and lateral planes was confirmed under fluoroscopy.  Pins followed by screws were placed with good fixation achieved.  There was restoration of inclination, height and volar tilt.  Bone quality was  good.  Five screws fixation obtained  proximal to the  metaphyseal extension of the fracture.  The patient had good wrist flexion and extension as well as pronation and supination.  At this time, thorough irrigation was performed.  The tourniquet was released.  Bleeding points encountered were controlled  using bipolar electrocautery.  Skin was closed using interrupted inverted 2-0 Vicryl suture followed by 3-0 nylon.  A well-padded volar splint was applied.  Shoulder sling applied.  The patient tolerated the procedure well without immediate complications  and transferred to the recovery room in stable condition.  LN/NUANCE  D:04/07/2018 T:04/07/2018 JOB:[id number]

## 2018-04-07 NOTE — Anesthesia Preprocedure Evaluation (Addendum)
Anesthesia Evaluation  Patient identified by MRN, date of birth, ID band Patient awake    Reviewed: Allergy & Precautions, NPO status , Patient's Chart, lab work & pertinent test results  History of Anesthesia Complications Negative for: history of anesthetic complications  Airway Mallampati: III  TM Distance: >3 FB Neck ROM: Full    Dental  (+) Dental Advisory Given, Teeth Intact   Pulmonary neg pulmonary ROS,    breath sounds clear to auscultation       Cardiovascular hypertension, Pt. on medications  Rhythm:Regular Rate:Normal     Neuro/Psych PSYCHIATRIC DISORDERS Depression  Multiple sclerosis     GI/Hepatic Neg liver ROS, GERD  Controlled,  Endo/Other  Morbid obesity  Renal/GU negative Renal ROS     Musculoskeletal  (+) Arthritis ,   Abdominal (+) + obese,   Peds  Hematology negative hematology ROS (+)   Anesthesia Other Findings   Reproductive/Obstetrics                            Anesthesia Physical Anesthesia Plan  ASA: III  Anesthesia Plan: General   Post-op Pain Management:  Regional for Post-op pain   Induction: Intravenous  PONV Risk Score and Plan: 3 and Treatment may vary due to age or medical condition, Ondansetron and Dexamethasone  Airway Management Planned: Oral ETT  Additional Equipment: None  Intra-op Plan:   Post-operative Plan: Extubation in OR  Informed Consent: I have reviewed the patients History and Physical, chart, labs and discussed the procedure including the risks, benefits and alternatives for the proposed anesthesia with the patient or authorized representative who has indicated his/her understanding and acceptance.   Dental advisory given  Plan Discussed with: CRNA, Anesthesiologist and Surgeon  Anesthesia Plan Comments:        Anesthesia Quick Evaluation

## 2018-04-07 NOTE — Brief Op Note (Signed)
04/07/2018  4:59 PM  PATIENT:  Sydney Pace  62 y.o. female  PRE-OPERATIVE DIAGNOSIS:  RIGHT  WRIST FRACTURE  POST-OPERATIVE DIAGNOSIS:  RIGHT  WRIST FRACTURE  PROCEDURE:  Procedure(s): RIGHT WRIST OPEN REDUCTION INTERNAL FIXATION (ORIF)  SURGEON:  Surgeon(s): Marlou Sa, Tonna Corner, MD  ASSISTANT: Nyoka Cowden rnfa  ANESTHESIA:   general  EBL: 25 ml    Total I/O In: 700 [I.V.:700] Out: -   BLOOD ADMINISTERED: none  DRAINS: none   LOCAL MEDICATIONS USED:  none  SPECIMEN:  No Specimen  COUNTS:  YES  TOURNIQUET:   Total Tourniquet Time Documented: Upper Arm (Right) - 50 minutes Total: Upper Arm (Right) - 50 minutes   DICTATION: .Other Dictation: Dictation Number [id number]  PLAN OF CARE: Discharge to home after PACU  PATIENT DISPOSITION:  PACU - hemodynamically stable

## 2018-04-07 NOTE — Anesthesia Postprocedure Evaluation (Signed)
Anesthesia Post Note  Patient: Sydney Pace  Procedure(s) Performed: RIGHT WRIST OPEN REDUCTION INTERNAL FIXATION (ORIF) (Right )     Patient location during evaluation: PACU Anesthesia Type: General and Regional Level of consciousness: awake and alert, oriented and patient cooperative Pain management: pain level controlled Vital Signs Assessment: post-procedure vital signs reviewed and stable Respiratory status: spontaneous breathing, nonlabored ventilation and respiratory function stable Cardiovascular status: blood pressure returned to baseline and stable Postop Assessment: no apparent nausea or vomiting Anesthetic complications: no    Last Vitals:  Vitals:   04/07/18 1748 04/07/18 1808  BP: 136/79 134/72  Pulse: 77 80  Resp: 17 14  Temp: 36.7 C 36.7 C  SpO2: 95% 97%    Last Pain:  Vitals:   04/07/18 1748  TempSrc:   PainSc: Asleep                 Momin Misko,E. Kazimir Hartnett

## 2018-04-07 NOTE — H&P (Signed)
Sydney Pace is an 62 y.o. female.   Chief Complaint: Right wrist pain HPI: Patient presents with right wrist pain.  She had a fall approximately 2 days ago.  She is right-hand dominant.  She fell on the affected right-hand side.  She denies any other orthopedic complaints.  Plain radiographs demonstrate comminuted displaced intra-articular distal radius fracture.  Past Medical History:  Diagnosis Date  . Arthritis   . Arthritis 07/06/2017  . Dehydration 06/09/2016  . Depression   . Esophageal reflux 08/08/2012  . GERD (gastroesophageal reflux disease)   . Hypertension   . MS (multiple sclerosis) ([place])   . Other and unspecified hyperlipidemia 08/08/2012  . Thyroid disease     Past Surgical History:  Procedure Laterality Date  . HYSTEROSCOPY  02, 04, 2008   with D&C  . TMJ ARTHROSCOPY    . TONSILLECTOMY      Family History  Problem Relation Age of Onset  . Heart disease Mother        pacemaker  . Emphysema Mother   . Hypertension Mother   . Heart disease Father   . Diabetes Father   . Heart disease Sister        cad  . Sleep apnea Brother   . Colon cancer Neg Hx   . Esophageal cancer Neg Hx   . Rectal cancer Neg Hx   . Stomach cancer Neg Hx    Social History:  reports that she has never smoked. She has never used smokeless tobacco. She reports that she drinks alcohol. She reports that she does not use drugs.  Allergies: No Known Allergies  Medications Prior to Admission  Medication Sig Dispense Refill  . brimonidine (ALPHAGAN) 0.2 % ophthalmic solution Place 1 drop into both eyes 2 (two) times daily.    . Budesonide (RHINOCORT ALLERGY NA) Place 1 spray into the nose daily.    Marland Kitchen buPROPion (WELLBUTRIN XL) 300 MG 24 hr tablet Take 1 tablet (300 mg total) by mouth daily. 30 tablet 11  . Carboxymethylcellul-Glycerin (LUBRICATING EYE DROPS OP) Place 1 drop into both eyes daily as needed (dry eyes).    . cetirizine (ZYRTEC) 10 MG tablet Take 10 mg by mouth every evening.      . Cholecalciferol (DIALYVITE VITAMIN D 5000 PO) Take 5,000 Units by mouth daily.    Marland Kitchen docusate sodium (COLACE) 100 MG capsule Take 100 mg by mouth 2 (two) times daily.    . furosemide (LASIX) 20 MG tablet Take 1 tablet (20 mg total) by mouth daily as needed. (Patient taking differently: Take 20 mg by mouth daily as needed for edema. ) 90 tablet 1  . gabapentin (NEURONTIN) 600 MG tablet Take one or two pills at night (Patient taking differently: Take 600 mg by mouth daily as needed (pain). ) 60 tablet 11  . HYDROcodone-acetaminophen (NORCO) 5-325 MG tablet Take 1 tablet by mouth every 6 (six) hours as needed for moderate pain. 15 tablet 0  . hyoscyamine (LEVSIN SL) 0.125 MG SL tablet Place 1 tablet (0.125 mg total) under the tongue every 4 (four) hours as needed. (Patient taking differently: Place 0.125 mg under the tongue every 4 (four) hours as needed for cramping. ) 30 tablet 1  . lisinopril-hydrochlorothiazide (PRINZIDE,ZESTORETIC) 20-25 MG tablet TAKE 1 TABLET BY MOUTH ONCE DAILY 90 tablet 1  . metroNIDAZOLE (FLAGYL) 500 MG tablet Take 1 tablet (500 mg total) by mouth 2 (two) times daily. For 7 days.  Avoid alcohol while taking 14 tablet 0  .  modafinil (PROVIGIL) 200 MG tablet One po in AM and at noon (Patient taking differently: Take 200 mg by mouth daily. ) 60 tablet 5  . Multiple Vitamin (MULTIVITAMIN) tablet Take 1 tablet by mouth daily.    . naproxen sodium (ALEVE) 220 MG tablet Take 220 mg by mouth daily as needed (pain).    Marland Kitchen oxybutynin (DITROPAN) 5 MG tablet Take 1 tablet (5 mg total) by mouth 2 (two) times daily. 180 tablet 3  . phenazopyridine (PYRIDIUM) 200 MG tablet Take 1 tablet (200 mg total) by mouth 3 (three) times daily as needed for pain. 10 tablet 0  . sertraline (ZOLOFT) 100 MG tablet Take 1 tablet (100 mg total) by mouth daily. 90 tablet 1    Results for orders placed or performed during the hospital encounter of 04/07/18 (from the past 48 hour(s))  Basic metabolic panel      Status: Abnormal   Collection Time: 04/07/18  1:30 PM  Result Value Ref Range   Sodium 137 135 - 145 mmol/L   Potassium 3.7 3.5 - 5.1 mmol/L   Chloride 104 98 - 111 mmol/L   CO2 22 22 - 32 mmol/L   Glucose, Bld 91 70 - 99 mg/dL   BUN 13 8 - 23 mg/dL   Creatinine, Ser 1.08 (H) 0.44 - 1.00 mg/dL   Calcium 9.8 8.9 - 10.3 mg/dL   GFR calc non Af Amer 53 (L) >60 mL/min   GFR calc Af Amer >60 >60 mL/min    Comment: (NOTE) The eGFR has been calculated using the CKD EPI equation. This calculation has not been validated in all clinical situations. eGFR's persistently <60 mL/min signify possible Chronic Kidney Disease.    Anion gap 11 5 - 15    Comment: Performed at [facility] [address], [place], [place] [address]  CBC     Status: None   Collection Time: 04/07/18  1:30 PM  Result Value Ref Range   WBC 7.9 4.0 - 10.5 K/uL   RBC 4.16 3.87 - 5.11 MIL/uL   Hemoglobin 12.3 12.0 - 15.0 g/dL   HCT 38.2 36.0 - 46.0 %   MCV 91.8 80.0 - 100.0 fL   MCH 29.6 26.0 - 34.0 pg   MCHC 32.2 30.0 - 36.0 g/dL   RDW 14.6 11.5 - 15.5 %   Platelets 250 150 - 400 K/uL   nRBC 0.0 0.0 - 0.2 %    Comment: Performed at [facility], [place] [address], [place], [place] [address]   Dg Wrist Complete Right  Result Date: 04/06/2018 CLINICAL DATA:  Fall.  Severe pain right wrist. EXAM: RIGHT WRIST - COMPLETE 3+ VIEW COMPARISON:  No recent. FINDINGS: Comminuted, displaced, overriding fracture of the distal radial metaphysis is noted. Fracture extension into the radiocarpal joint space appears to be present. Diffuse degenerative change. IMPRESSION: Complex fracture of the distal right radius. Electronically Signed   By: Marcello Moores  Register   On: 04/06/2018 12:03    Review of Systems  Musculoskeletal: Positive for joint pain.  All other systems reviewed and are negative.   Blood pressure (!) 164/75, pulse 76, temperature 98.2 F (36.8 C), temperature source Oral, resp. rate 14, height _0  (1.651  m), weight 127.9 kg, SpO2 100 %. Physical Exam  Constitutional: She appears well-developed.  HENT:  Head: Normocephalic.  Eyes: Pupils are equal, round, and reactive to light.  Neck: Normal range of motion.  Cardiovascular: Normal rate.  Respiratory: Effort normal.  Neurological:  She is alert.  Skin: Skin is warm.  Psychiatric: She has a normal mood and affect.  Examination of the right wrist demonstrates some swelling.  Elbow range of motion is intact.  Radial pulses intact.  Finger flexion and extension is intact.  Compartments are soft.  There is significant tenderness to palpation around the distal radial region.  Assessment/Plan Impression is comminuted and displaced intra-articular distal radius fracture.  Plan is open reduction internal fixation.  Risk benefits are discussed including but limited to infection nerve vessel damage wrist stiffness as well as potential for prolonged rehabilitation.  All questions answered.  Anderson Malta, MD 04/07/2018, 2:48 PM

## 2018-04-11 ENCOUNTER — Encounter (HOSPITAL_COMMUNITY): Payer: Self-pay | Admitting: Orthopedic Surgery

## 2018-04-13 ENCOUNTER — Telehealth (INDEPENDENT_AMBULATORY_CARE_PROVIDER_SITE_OTHER): Payer: Self-pay | Admitting: Orthopedic Surgery

## 2018-04-13 NOTE — Telephone Encounter (Signed)
Please advise 

## 2018-04-13 NOTE — Telephone Encounter (Signed)
Patient called asking If it was okay if she was able to take Aleve.  Patients # [phone]

## 2018-04-13 NOTE — Telephone Encounter (Signed)
Patient called asked if she can take Aleve? The number to contact patient is [phone]

## 2018-04-13 NOTE — Telephone Encounter (Signed)
Duplicate-see other message-waiting to be advised by Dr Marlou Sa

## 2018-04-14 NOTE — Telephone Encounter (Signed)
IC s/w patient and advised  

## 2018-04-14 NOTE — Telephone Encounter (Signed)
Y pls call thx

## 2018-04-15 ENCOUNTER — Ambulatory Visit (INDEPENDENT_AMBULATORY_CARE_PROVIDER_SITE_OTHER): Payer: Medicare HMO

## 2018-04-15 ENCOUNTER — Encounter (INDEPENDENT_AMBULATORY_CARE_PROVIDER_SITE_OTHER): Payer: Self-pay | Admitting: Orthopedic Surgery

## 2018-04-15 ENCOUNTER — Ambulatory Visit (INDEPENDENT_AMBULATORY_CARE_PROVIDER_SITE_OTHER): Payer: Medicare HMO | Admitting: Orthopedic Surgery

## 2018-04-15 DIAGNOSIS — S52571A Other intraarticular fracture of lower end of right radius, initial encounter for closed fracture: Secondary | ICD-10-CM

## 2018-04-15 NOTE — Progress Notes (Signed)
Post-Op Visit Note   Patient: Sydney Pace           Date of Birth: [birth year]           MRN: [id number] Visit Date: 04/15/2018 PCP: [name], MD   Assessment & Plan:  Chief Complaint:  Chief Complaint  Patient presents with  . Right Wrist - Routine Post Op   Visit Diagnoses:  1. Other closed intra-articular fracture of distal end of right radius, initial encounter     Plan: Patient presents now 9 days out from right distal radius fracture open reduction internal fixation.  Patient's been doing well.  On exam she has good pronation supination and fairly good flexion extension strength and range of motion.  She is got about 30 to 40 degrees of flexion extension directly out of the splint.  Radiographs look good.  Plan at this time is to put her in a removable wrist splint.  I want her doing flexion extension exercises 30 reps 3 times a day and I will take her sutures out on Monday.  No lifting with right arm.  Follow-Up Instructions: No follow-ups on file.   Orders:  Orders Placed This Encounter  Procedures  . XR Wrist 2 Views Right   No orders of the defined types were placed in this encounter.   Imaging: Xr Wrist 2 Views Right  Result Date: 04/15/2018 AP lateral right wrist reviewed.  Plate fixation of distal radius fracture in good position alignment.  There is restoration of height tilt and inclination.  No evidence of hardware complication.   PMFS History: Patient Active Problem List   Diagnosis Date Noted  . Post-menopausal bleeding 03/17/2018  . Dysuria 03/17/2018  . High serum parathyroid hormone (PTH) 01/10/2018  . Hypercalcemia 01/10/2018  . Chronic heel pain, left 07/06/2017  . Arthritis 07/06/2017  . Osteopenia 07/06/2017  . Hx of colonic polyp 07/06/2017  . Cervical cancer screening 07/06/2017  . Obesity 07/06/2017  . Dry eyes, bilateral 10/10/2015  . Low-tension glaucoma of both eyes, moderate stage 10/10/2015  . Nuclear sclerotic cataract  of both eyes 10/10/2015  . Partial optic atrophy of left eye 10/10/2015  . Insomnia 08/16/2014  . Optic neuritis 08/16/2014  . Ataxic gait 08/16/2014  . Other fatigue 08/16/2014  . Abdominal pain, chronic, epigastric 04/03/2014  . Colon cancer screening 04/03/2014  . Helicobacter pylori (H. pylori) infection 03/21/2014  . Preventative health care 08/27/2013  . Sinusitis 08/08/2012  . Hyperlipidemia, mixed 08/08/2012  . Allergic state 08/08/2012  . Esophageal reflux 08/08/2012  . Depression 01/23/2011  . Leg swelling 01/23/2011  . Multiple sclerosis ([place]) 09/23/2010  . Essential hypertension 09/23/2010  . Overactive bladder 09/23/2010  . Thyroid nodule 09/23/2010  . Pulmonary nodule 09/23/2010  . Fatigue 09/23/2010   Past Medical History:  Diagnosis Date  . Arthritis   . Arthritis 07/06/2017  . Dehydration 06/09/2016  . Depression   . Esophageal reflux 08/08/2012  . GERD (gastroesophageal reflux disease)   . Hypertension   . MS (multiple sclerosis) ([place])   . Other and unspecified hyperlipidemia 08/08/2012  . Thyroid disease     Family History  Problem Relation Age of Onset  . Heart disease Mother        pacemaker  . Emphysema Mother   . Hypertension Mother   . Heart disease Father   . Diabetes Father   . Heart disease Sister        cad  . Sleep apnea Brother   .  Colon cancer Neg Hx   . Esophageal cancer Neg Hx   . Rectal cancer Neg Hx   . Stomach cancer Neg Hx     Past Surgical History:  Procedure Laterality Date  . HYSTEROSCOPY  02, 04, 2008   with D&C  . ORIF WRIST FRACTURE Right 04/07/2018   Procedure: RIGHT WRIST OPEN REDUCTION INTERNAL FIXATION (ORIF);  Surgeon: Meredith Pel, MD;  Location: [facility];  Service: Orthopedics;  Laterality: Right;  . TMJ ARTHROSCOPY    . TONSILLECTOMY     Social History   Occupational History  . Not on file  Tobacco Use  . Smoking status: Never Smoker  . Smokeless tobacco: Never Used  Substance and Sexual Activity  .  Alcohol use: Yes    Alcohol/week: 0.0 standard drinks    Comment: occassional  . Drug use: No  . Sexual activity: Never    Comment: 1st intercourse 13 yo-5 partners

## 2018-04-18 ENCOUNTER — Other Ambulatory Visit: Payer: Medicare HMO

## 2018-04-18 ENCOUNTER — Ambulatory Visit (INDEPENDENT_AMBULATORY_CARE_PROVIDER_SITE_OTHER): Payer: Medicare HMO | Admitting: Orthopedic Surgery

## 2018-04-18 ENCOUNTER — Encounter (INDEPENDENT_AMBULATORY_CARE_PROVIDER_SITE_OTHER): Payer: Self-pay | Admitting: Orthopedic Surgery

## 2018-04-18 ENCOUNTER — Ambulatory Visit: Payer: Medicare HMO | Admitting: Gynecology

## 2018-04-18 DIAGNOSIS — S52571A Other intraarticular fracture of lower end of right radius, initial encounter for closed fracture: Secondary | ICD-10-CM

## 2018-04-18 NOTE — Addendum Note (Signed)
Addended byLaurann Montana on: 04/18/2018 04:42 PM   Modules accepted: Orders

## 2018-04-18 NOTE — Progress Notes (Signed)
Post-Op Visit Note   Patient: Sydney Pace           Date of Birth: 10/18/1955           MRN: [id number] Visit Date: 04/18/2018 PCP: [name], MD   Assessment & Plan:  Chief Complaint:  Chief Complaint  Patient presents with  . Right Wrist - Routine Post Op   Visit Diagnoses:  1. Other closed intra-articular fracture of distal end of right radius, initial encounter     Plan: [name] is a patient with right wrist fracture.  She underwent surgery about a week ago.  On exam she has improving range of motion.  Sutures removed today.  I will start her in occupational therapy 1 time a week plus I want her to come out of that wrist splint about 3 times a day to work on wrist range of motion exercises.  No lifting it.  Come back in 3 weeks with repeat radiographs.  Follow-Up Instructions: Return in about 3 weeks (around 05/09/2018).   Orders:  No orders of the defined types were placed in this encounter.  No orders of the defined types were placed in this encounter.   Imaging: No results found.  PMFS History: Patient Active Problem List   Diagnosis Date Noted  . Post-menopausal bleeding 03/17/2018  . Dysuria 03/17/2018  . High serum parathyroid hormone (PTH) 01/10/2018  . Hypercalcemia 01/10/2018  . Chronic heel pain, left 07/06/2017  . Arthritis 07/06/2017  . Osteopenia 07/06/2017  . Hx of colonic polyp 07/06/2017  . Cervical cancer screening 07/06/2017  . Obesity 07/06/2017  . Dry eyes, bilateral 10/10/2015  . Low-tension glaucoma of both eyes, moderate stage 10/10/2015  . Nuclear sclerotic cataract of both eyes 10/10/2015  . Partial optic atrophy of left eye 10/10/2015  . Insomnia 08/16/2014  . Optic neuritis 08/16/2014  . Ataxic gait 08/16/2014  . Other fatigue 08/16/2014  . Abdominal pain, chronic, epigastric 04/03/2014  . Colon cancer screening 04/03/2014  . Helicobacter pylori (H. pylori) infection 03/21/2014  . Preventative health care 08/27/2013  .  Sinusitis 08/08/2012  . Hyperlipidemia, mixed 08/08/2012  . Allergic state 08/08/2012  . Esophageal reflux 08/08/2012  . Depression 01/23/2011  . Leg swelling 01/23/2011  . Multiple sclerosis ([place]) 09/23/2010  . Essential hypertension 09/23/2010  . Overactive bladder 09/23/2010  . Thyroid nodule 09/23/2010  . Pulmonary nodule 09/23/2010  . Fatigue 09/23/2010   Past Medical History:  Diagnosis Date  . Arthritis   . Arthritis 07/06/2017  . Dehydration 06/09/2016  . Depression   . Esophageal reflux 08/08/2012  . GERD (gastroesophageal reflux disease)   . Hypertension   . MS (multiple sclerosis) ([place])   . Other and unspecified hyperlipidemia 08/08/2012  . Thyroid disease     Family History  Problem Relation Age of Onset  . Heart disease Mother        pacemaker  . Emphysema Mother   . Hypertension Mother   . Heart disease Father   . Diabetes Father   . Heart disease Sister        cad  . Sleep apnea Brother   . Colon cancer Neg Hx   . Esophageal cancer Neg Hx   . Rectal cancer Neg Hx   . Stomach cancer Neg Hx     Past Surgical History:  Procedure Laterality Date  . HYSTEROSCOPY  02, 04, 2008   with D&C  . ORIF WRIST FRACTURE Right 04/07/2018   Procedure: RIGHT WRIST OPEN REDUCTION  INTERNAL FIXATION (ORIF);  Surgeon: Meredith Pel, MD;  Location: [facility];  Service: Orthopedics;  Laterality: Right;  . TMJ ARTHROSCOPY    . TONSILLECTOMY     Social History   Occupational History  . Not on file  Tobacco Use  . Smoking status: Never Smoker  . Smokeless tobacco: Never Used  Substance and Sexual Activity  . Alcohol use: Yes    Alcohol/week: 0.0 standard drinks    Comment: occassional  . Drug use: No  . Sexual activity: Never    Comment: 1st intercourse 13 yo-5 partners

## 2018-04-20 DIAGNOSIS — H52221 Regular astigmatism, right eye: Secondary | ICD-10-CM | POA: Diagnosis not present

## 2018-04-20 DIAGNOSIS — H47292 Other optic atrophy, left eye: Secondary | ICD-10-CM | POA: Diagnosis not present

## 2018-04-20 DIAGNOSIS — H40023 Open angle with borderline findings, high risk, bilateral: Secondary | ICD-10-CM | POA: Diagnosis not present

## 2018-04-20 DIAGNOSIS — H04123 Dry eye syndrome of bilateral lacrimal glands: Secondary | ICD-10-CM | POA: Diagnosis not present

## 2018-04-20 DIAGNOSIS — H2513 Age-related nuclear cataract, bilateral: Secondary | ICD-10-CM | POA: Diagnosis not present

## 2018-04-20 DIAGNOSIS — G35 Multiple sclerosis: Secondary | ICD-10-CM | POA: Diagnosis not present

## 2018-04-20 DIAGNOSIS — H5203 Hypermetropia, bilateral: Secondary | ICD-10-CM | POA: Diagnosis not present

## 2018-04-20 DIAGNOSIS — H524 Presbyopia: Secondary | ICD-10-CM | POA: Diagnosis not present

## 2018-04-25 DIAGNOSIS — M25531 Pain in right wrist: Secondary | ICD-10-CM | POA: Diagnosis not present

## 2018-04-27 ENCOUNTER — Telehealth: Payer: Self-pay | Admitting: Neurology

## 2018-04-27 NOTE — Telephone Encounter (Signed)
I called pt back. She is doing well since surgery. Legs are a little weaker and now uses cane while ambulating. She stopped gabapentin 04/06/18. I updated med list. She has f/u 07/2018 that she will keep. I offered to make sooner f/u but she declined. She will call back if she has new or worsening sx.

## 2018-04-27 NOTE — Addendum Note (Signed)
Addended by: Hope Pigeon on: 04/27/2018 01:43 PM   Modules accepted: Orders

## 2018-04-27 NOTE — Telephone Encounter (Signed)
Pt called to inform Dr Felecia Shelling that she fell and broke right wrist and had surgery on 11-07. Pt no longer taking Gabapentin.  Pt now using a cain, she is not asking for a call back but is ok if RN wants to call

## 2018-05-02 DIAGNOSIS — M25531 Pain in right wrist: Secondary | ICD-10-CM | POA: Diagnosis not present

## 2018-05-05 ENCOUNTER — Encounter: Payer: Self-pay | Admitting: Gastroenterology

## 2018-05-09 ENCOUNTER — Encounter (INDEPENDENT_AMBULATORY_CARE_PROVIDER_SITE_OTHER): Payer: Self-pay | Admitting: Orthopedic Surgery

## 2018-05-09 ENCOUNTER — Ambulatory Visit (INDEPENDENT_AMBULATORY_CARE_PROVIDER_SITE_OTHER): Payer: Medicare HMO | Admitting: Orthopedic Surgery

## 2018-05-09 ENCOUNTER — Ambulatory Visit (INDEPENDENT_AMBULATORY_CARE_PROVIDER_SITE_OTHER): Payer: Medicare HMO

## 2018-05-09 VITALS — Ht 65.0 in | Wt 282.0 lb

## 2018-05-09 DIAGNOSIS — S52571A Other intraarticular fracture of lower end of right radius, initial encounter for closed fracture: Secondary | ICD-10-CM

## 2018-05-11 ENCOUNTER — Encounter (INDEPENDENT_AMBULATORY_CARE_PROVIDER_SITE_OTHER): Payer: Self-pay | Admitting: Orthopedic Surgery

## 2018-05-11 NOTE — Progress Notes (Signed)
Post-Op Visit Note   Patient: Sydney Pace           Date of Birth: 07-20-1955           MRN: [id number] Visit Date: 05/09/2018 PCP: [name], MD   Assessment & Plan:  Chief Complaint:  Chief Complaint  Patient presents with  . Right Wrist - Follow-up    04/07/18 ORIF Right Wrist   Visit Diagnoses:  1. Other closed intra-articular fracture of distal end of right radius, initial encounter     Plan: Patient presents now a month out right wrist fracture fixation.  She is doing well without much pain she.  She is in a wrist splint but doing exercises on her own.  She would like to continue to do that on her own without physical or Occupational Therapy.  Takes Aleve with some relief.  On exam she does have improving grip strength.  Incision is intact.  She has about 45 degrees of wrist flexion and about 20 of wrist extension.  I would like for her to work more on wrist flexion and extension on her own and I demonstrated that for her in terms of how to get more of that type of motion.  Pronation supination is intact.  I am going to have her discontinue splint use at night and then discontinue it altogether after another week.  I do not really want her doing any lifting more than 5 pounds with that wrist but I do want her working on range of motion.  Come back in 3 to 4 weeks for clinical recheck.  Radiographs look good today.  Follow-Up Instructions: Return in about 4 weeks (around 06/06/2018).   Orders:  Orders Placed This Encounter  Procedures  . XR Wrist 2 Views Right   No orders of the defined types were placed in this encounter.   Imaging: No results found.  PMFS History: Patient Active Problem List   Diagnosis Date Noted  . Post-menopausal bleeding 03/17/2018  . Dysuria 03/17/2018  . High serum parathyroid hormone (PTH) 01/10/2018  . Hypercalcemia 01/10/2018  . Chronic heel pain, left 07/06/2017  . Arthritis 07/06/2017  . Osteopenia 07/06/2017  . Hx of colonic  polyp 07/06/2017  . Cervical cancer screening 07/06/2017  . Obesity 07/06/2017  . Dry eyes, bilateral 10/10/2015  . Low-tension glaucoma of both eyes, moderate stage 10/10/2015  . Nuclear sclerotic cataract of both eyes 10/10/2015  . Partial optic atrophy of left eye 10/10/2015  . Insomnia 08/16/2014  . Optic neuritis 08/16/2014  . Ataxic gait 08/16/2014  . Other fatigue 08/16/2014  . Abdominal pain, chronic, epigastric 04/03/2014  . Colon cancer screening 04/03/2014  . Helicobacter pylori (H. pylori) infection 03/21/2014  . Preventative health care 08/27/2013  . Sinusitis 08/08/2012  . Hyperlipidemia, mixed 08/08/2012  . Allergic state 08/08/2012  . Esophageal reflux 08/08/2012  . Depression 01/23/2011  . Leg swelling 01/23/2011  . Multiple sclerosis ([place]) 09/23/2010  . Essential hypertension 09/23/2010  . Overactive bladder 09/23/2010  . Thyroid nodule 09/23/2010  . Pulmonary nodule 09/23/2010  . Fatigue 09/23/2010   Past Medical History:  Diagnosis Date  . Arthritis   . Arthritis 07/06/2017  . Dehydration 06/09/2016  . Depression   . Esophageal reflux 08/08/2012  . GERD (gastroesophageal reflux disease)   . Hypertension   . MS (multiple sclerosis) ([place])   . Other and unspecified hyperlipidemia 08/08/2012  . Thyroid disease     Family History  Problem Relation Age of Onset  .  Heart disease Mother        pacemaker  . Emphysema Mother   . Hypertension Mother   . Heart disease Father   . Diabetes Father   . Heart disease Sister        cad  . Sleep apnea Brother   . Colon cancer Neg Hx   . Esophageal cancer Neg Hx   . Rectal cancer Neg Hx   . Stomach cancer Neg Hx     Past Surgical History:  Procedure Laterality Date  . HYSTEROSCOPY  02, 04, 2008   with D&C  . ORIF WRIST FRACTURE Right 04/07/2018   Procedure: RIGHT WRIST OPEN REDUCTION INTERNAL FIXATION (ORIF);  Surgeon: Meredith Pel, MD;  Location: [facility];  Service: Orthopedics;  Laterality: Right;  . TMJ  ARTHROSCOPY    . TONSILLECTOMY     Social History   Occupational History  . Not on file  Tobacco Use  . Smoking status: Never Smoker  . Smokeless tobacco: Never Used  Substance and Sexual Activity  . Alcohol use: Yes    Alcohol/week: 0.0 standard drinks    Comment: occassional  . Drug use: No  . Sexual activity: Never    Comment: 1st intercourse 13 yo-5 partners

## 2018-05-15 ENCOUNTER — Other Ambulatory Visit: Payer: Self-pay | Admitting: Family Medicine

## 2018-05-18 ENCOUNTER — Ambulatory Visit (AMBULATORY_SURGERY_CENTER): Payer: Self-pay | Admitting: *Deleted

## 2018-05-18 VITALS — Ht 66.0 in | Wt 280.0 lb

## 2018-05-18 DIAGNOSIS — Z1211 Encounter for screening for malignant neoplasm of colon: Secondary | ICD-10-CM

## 2018-05-18 MED ORDER — NA SULFATE-K SULFATE-MG SULF 17.5-3.13-1.6 GM/177ML PO SOLN: 1.0000 | Freq: Once | ORAL | 0 refills | Status: AC

## 2018-05-18 NOTE — Progress Notes (Signed)
No egg or soy allergy known to patient  No issues with past sedation with any surgeries  or procedures, no intubation problems  No diet pills per patient No home 02 use per patient  No blood thinners per patient  Pt denies issues with constipation currently- she does take colace daily and she has soft stools daily-  No A fib or A flutter  EMMI video sent to pt's e mail - pt declined

## 2018-05-24 ENCOUNTER — Encounter: Payer: Self-pay | Admitting: Gastroenterology

## 2018-06-01 HISTORY — PX: POLYPECTOMY: SHX149

## 2018-06-01 HISTORY — PX: COLONOSCOPY: SHX174

## 2018-06-02 ENCOUNTER — Ambulatory Visit (INDEPENDENT_AMBULATORY_CARE_PROVIDER_SITE_OTHER): Payer: Medicare HMO | Admitting: Orthopedic Surgery

## 2018-06-02 ENCOUNTER — Encounter (INDEPENDENT_AMBULATORY_CARE_PROVIDER_SITE_OTHER): Payer: Self-pay | Admitting: Orthopedic Surgery

## 2018-06-02 DIAGNOSIS — S52571A Other intraarticular fracture of lower end of right radius, initial encounter for closed fracture: Secondary | ICD-10-CM

## 2018-06-02 NOTE — Progress Notes (Signed)
Post-Op Visit Note   Patient: Sydney Pace           Date of Birth: 10-28-1955           MRN: [id number] Visit Date: 06/02/2018 PCP: [name], MD   Assessment & Plan:  Chief Complaint:  Chief Complaint  Patient presents with  . Right Wrist - Pain, Follow-up   Visit Diagnoses:  1. Other closed intra-articular fracture of distal end of right radius, initial encounter     Plan: Patient presents now almost 2 months out open reduction internal fixation right wrist fracture.  She is using a wrist brace.  Doing home exercises.  Overall she is improving.  On exam she has flexion to about 70 degrees wrist extension only is about 20 to 25 degrees.  Pronation supination is improving.  Grip strength also improving.  At this time I want her to continue to work on achieving a little bit more wrist extension.  Discontinue the splint.  I do not want her lifting anything more than 5 pounds for the first month during January.  After that she can proceed with activity as tolerated.  Follow-up with me as needed.  Follow-Up Instructions: Return if symptoms worsen or fail to improve.   Orders:  No orders of the defined types were placed in this encounter.  No orders of the defined types were placed in this encounter.   Imaging: No results found.  PMFS History: Patient Active Problem List   Diagnosis Date Noted  . Post-menopausal bleeding 03/17/2018  . Dysuria 03/17/2018  . High serum parathyroid hormone (PTH) 01/10/2018  . Hypercalcemia 01/10/2018  . Chronic heel pain, left 07/06/2017  . Arthritis 07/06/2017  . Osteopenia 07/06/2017  . Hx of colonic polyp 07/06/2017  . Cervical cancer screening 07/06/2017  . Obesity 07/06/2017  . Dry eyes, bilateral 10/10/2015  . Low-tension glaucoma of both eyes, moderate stage 10/10/2015  . Nuclear sclerotic cataract of both eyes 10/10/2015  . Partial optic atrophy of left eye 10/10/2015  . Insomnia 08/16/2014  . Optic neuritis 08/16/2014  .  Ataxic gait 08/16/2014  . Other fatigue 08/16/2014  . Abdominal pain, chronic, epigastric 04/03/2014  . Colon cancer screening 04/03/2014  . Helicobacter pylori (H. pylori) infection 03/21/2014  . Preventative health care 08/27/2013  . Sinusitis 08/08/2012  . Hyperlipidemia, mixed 08/08/2012  . Allergic state 08/08/2012  . Esophageal reflux 08/08/2012  . Depression 01/23/2011  . Leg swelling 01/23/2011  . Multiple sclerosis ([place]) 09/23/2010  . Essential hypertension 09/23/2010  . Overactive bladder 09/23/2010  . Thyroid nodule 09/23/2010  . Pulmonary nodule 09/23/2010  . Fatigue 09/23/2010   Past Medical History:  Diagnosis Date  . Allergy   . Anemia   . Arthritis   . Arthritis 07/06/2017  . Constipation    occ  . Dehydration 06/09/2016  . Depression   . Esophageal reflux 08/08/2012  . GERD (gastroesophageal reflux disease)   . Hypertension   . MS (multiple sclerosis) ([place])   . Neuromuscular disorder ([place])    MS  . Other and unspecified hyperlipidemia 08/08/2012  . Thyroid disease     Family History  Problem Relation Age of Onset  . Heart disease Mother        pacemaker  . Emphysema Mother   . Hypertension Mother   . Heart disease Father   . Diabetes Father   . Heart disease Sister        cad  . Sleep apnea Brother   .  Colon cancer Neg Hx   . Esophageal cancer Neg Hx   . Rectal cancer Neg Hx   . Stomach cancer Neg Hx   . Colon polyps Neg Hx     Past Surgical History:  Procedure Laterality Date  . COLONOSCOPY    . HYSTEROSCOPY  02, 04, 2008   with D&C  . ORIF WRIST FRACTURE Right 04/07/2018   Procedure: RIGHT WRIST OPEN REDUCTION INTERNAL FIXATION (ORIF);  Surgeon: Meredith Pel, MD;  Location: [facility];  Service: Orthopedics;  Laterality: Right;  . TMJ ARTHROSCOPY    . TONSILLECTOMY    . UPPER GASTROINTESTINAL ENDOSCOPY     Social History   Occupational History  . Not on file  Tobacco Use  . Smoking status: Never Smoker  . Smokeless tobacco: Never  Used  Substance and Sexual Activity  . Alcohol use: Yes    Alcohol/week: 0.0 standard drinks    Comment: occassional  . Drug use: No  . Sexual activity: Never    Comment: 1st intercourse 13 yo-5 partners

## 2018-06-03 ENCOUNTER — Telehealth (INDEPENDENT_AMBULATORY_CARE_PROVIDER_SITE_OTHER): Payer: Self-pay | Admitting: Orthopedic Surgery

## 2018-06-03 ENCOUNTER — Encounter (INDEPENDENT_AMBULATORY_CARE_PROVIDER_SITE_OTHER): Payer: Self-pay

## 2018-06-03 ENCOUNTER — Ambulatory Visit (INDEPENDENT_AMBULATORY_CARE_PROVIDER_SITE_OTHER): Payer: Medicare HMO | Admitting: Orthopedic Surgery

## 2018-06-03 DIAGNOSIS — S52571A Other intraarticular fracture of lower end of right radius, initial encounter for closed fracture: Secondary | ICD-10-CM

## 2018-06-03 NOTE — Progress Notes (Signed)
Patient was seen to have a one stitch removed from her right wrist.  Applied Bactroban and a band-aid per, Wendy May.

## 2018-06-08 ENCOUNTER — Encounter: Payer: Medicare HMO | Admitting: Gastroenterology

## 2018-06-09 ENCOUNTER — Other Ambulatory Visit: Payer: Self-pay | Admitting: Family Medicine

## 2018-06-09 DIAGNOSIS — Z1231 Encounter for screening mammogram for malignant neoplasm of breast: Secondary | ICD-10-CM

## 2018-06-17 ENCOUNTER — Encounter: Payer: Self-pay | Admitting: Gastroenterology

## 2018-06-17 ENCOUNTER — Ambulatory Visit (AMBULATORY_SURGERY_CENTER): Payer: Medicare HMO | Admitting: Gastroenterology

## 2018-06-17 VITALS — BP 125/92 | HR 71 | Temp 98.2°F | Resp 14 | Ht 66.0 in | Wt 280.0 lb

## 2018-06-17 DIAGNOSIS — D123 Benign neoplasm of transverse colon: Secondary | ICD-10-CM

## 2018-06-17 DIAGNOSIS — D127 Benign neoplasm of rectosigmoid junction: Secondary | ICD-10-CM

## 2018-06-17 DIAGNOSIS — Z1211 Encounter for screening for malignant neoplasm of colon: Secondary | ICD-10-CM | POA: Diagnosis not present

## 2018-06-17 DIAGNOSIS — D120 Benign neoplasm of cecum: Secondary | ICD-10-CM

## 2018-06-17 DIAGNOSIS — K635 Polyp of colon: Secondary | ICD-10-CM

## 2018-06-17 MED ORDER — SODIUM CHLORIDE 0.9 % IV SOLN: 500.0000 mL | Freq: Once | INTRAVENOUS | Status: DC

## 2018-06-17 NOTE — Progress Notes (Signed)
PT taken to PACU. Monitors in place. VSS. Report given to RN. 

## 2018-06-17 NOTE — Progress Notes (Signed)
Called to room to assist during endoscopic procedure.  Patient ID and intended procedure confirmed with present staff. Received instructions for my participation in the procedure from the performing physician.  

## 2018-06-17 NOTE — Progress Notes (Signed)
Pt's states no medical or surgical changes since previsit or office visit. 

## 2018-06-17 NOTE — Op Note (Signed)
[facility] Patient Name: Sydney Pace Procedure Date: 06/17/2018 1:37 PM MRN: [id number] Endoscopist: Gerrit Heck , MD Age: 63 Referring MD:  Date of Birth: April 07, 1956 Gender: Female Account #: [id number] Procedure:                Colonoscopy Indications:              Surveillance: Personal history of colonic polyps                            (unknown histology) on last colonoscopy more than 5                            years ago; last colonoscopy was approximately 10                            years ago at an outside facility and notable for                            polyps. Otherwise, no recent GI symptoms and no                            known family hx of colon cancer. Medicines:                Monitored Anesthesia Care Procedure:                Pre-Anesthesia Assessment:                           - Prior to the procedure, a History and Physical                            was performed, and patient medications and                            allergies were reviewed. The patient's tolerance of                            previous anesthesia was also reviewed. The risks                            and benefits of the procedure and the sedation                            options and risks were discussed with the patient.                            All questions were answered, and informed consent                            was obtained. Prior Anticoagulants: The patient has                            taken no previous anticoagulant or antiplatelet  agents. ASA Grade Assessment: III - A patient with                            severe systemic disease. After reviewing the risks                            and benefits, the patient was deemed in                            satisfactory condition to undergo the procedure.                           After obtaining informed consent, the colonoscope                            was passed under direct  vision. Throughout the                            procedure, the patient's blood pressure, pulse, and                            oxygen saturations were monitored continuously. The                            Colonoscope was introduced through the anus and                            advanced to the the terminal ileum. The colonoscopy                            was performed without difficulty. The patient                            tolerated the procedure well. The quality of the                            bowel preparation was adequate. Scope In: 1:43:58 PM Scope Out: 2:02:31 PM Scope Withdrawal Time: 0 hours 13 minutes 17 seconds  Total Procedure Duration: 0 hours 18 minutes 33 seconds  Findings:                 The perianal and digital rectal examinations were                            normal.                           Three sessile polyps were found in the transverse                            colon and cecum. The polyps were 3 to 4 mm in size.                            These polyps were removed with a cold snare.  Resection and retrieval were complete. Estimated                            blood loss was minimal.                           A 2 mm polyp was found in the recto-sigmoid colon.                            The polyp was sessile. The polyp was removed with a                            cold biopsy forceps. Resection and retrieval were                            complete. Estimated blood loss was minimal.                           The exam was otherwise normal throughout the                            remainder of the colon.                           The retroflexed view of the distal rectum and anal                            verge was normal and showed no anal or rectal                            abnormalities.                           The terminal ileum appeared normal. Complications:            No immediate complications. Estimated Blood Loss:      Estimated blood loss was minimal. Impression:               - Three 3 to 4 mm polyps in the transverse colon                            and in the cecum, removed with a cold snare.                            Resected and retrieved.                           - One 2 mm polyp at the recto-sigmoid colon,                            removed with a cold biopsy forceps. Resected and                            retrieved.                           -  The distal rectum and anal verge are normal on                            retroflexion view.                           - The examined portion of the ileum was normal. Recommendation:           - Patient has a contact number available for                            emergencies. The signs and symptoms of potential                            delayed complications were discussed with the                            patient. Return to normal activities tomorrow.                            Written discharge instructions were provided to the                            patient.                           - Resume previous diet today.                           - Continue present medications.                           - Await pathology results.                           - Repeat colonoscopy in 3 - 5 years for                            surveillance based on pathology results.                           - Return to GI clinic PRN. Gerrit Heck, MD 06/17/2018 2:10:03 PM

## 2018-06-17 NOTE — Patient Instructions (Signed)
Impression/Recommendations:  Polyp handout given to patient.  Resume previous diet. Continue present medications.  Await pathology results.  Repeat colonoscopy in 3-5 years for surveillance based on pathology results.  Return to GI clinic as needed.  YOU HAD AN ENDOSCOPIC PROCEDURE TODAY AT [facility]:   Refer to the procedure report that was given to you for any specific questions about what was found during the examination.  If the procedure report does not answer your questions, please call your gastroenterologist to clarify.  If you requested that your care partner not be given the details of your procedure findings, then the procedure report has been included in a sealed envelope for you to review at your convenience later.  YOU SHOULD EXPECT: Some feelings of bloating in the abdomen. Passage of more gas than usual.  Walking can help get rid of the air that was put into your GI tract during the procedure and reduce the bloating. If you had a lower endoscopy (such as a colonoscopy or flexible sigmoidoscopy) you may notice spotting of blood in your stool or on the toilet paper. If you underwent a bowel prep for your procedure, you may not have a normal bowel movement for a few days.  Please Note:  You might notice some irritation and congestion in your nose or some drainage.  This is from the oxygen used during your procedure.  There is no need for concern and it should clear up in a day or so.  SYMPTOMS TO REPORT IMMEDIATELY:   Following lower endoscopy (colonoscopy or flexible sigmoidoscopy):  Excessive amounts of blood in the stool  Significant tenderness or worsening of abdominal pains  Swelling of the abdomen that is new, acute  Fever of 100F or higher  For urgent or emergent issues, a gastroenterologist can be reached at any hour by calling [phone].   DIET:  We do recommend a small meal at first, but then you may proceed to your regular diet.  Drink  plenty of fluids but you should avoid alcoholic beverages for 24 hours.  ACTIVITY:  You should plan to take it easy for the rest of today and you should NOT DRIVE or use heavy machinery until tomorrow (because of the sedation medicines used during the test).    FOLLOW UP: Our staff will call the number listed on your records the next business day following your procedure to check on you and address any questions or concerns that you may have regarding the information given to you following your procedure. If we do not reach you, we will leave a message.  However, if you are feeling well and you are not experiencing any problems, there is no need to return our call.  We will assume that you have returned to your regular daily activities without incident.  If any biopsies were taken you will be contacted by phone or by letter within the next 1-3 weeks.  Please call us at [phone] if you have not heard about the biopsies in 3 weeks.    SIGNATURES/CONFIDENTIALITY: You and/or your care partner have signed paperwork which will be entered into your electronic medical record.  These signatures attest to the fact that that the information above on your After Visit Summary has been reviewed and is understood.  Full responsibility of the confidentiality of this discharge information lies with you and/or your care-partner.

## 2018-06-20 ENCOUNTER — Telehealth: Payer: Self-pay

## 2018-06-20 NOTE — Telephone Encounter (Signed)
Follow up call made, left a voicemail , name identifier. 

## 2018-06-20 NOTE — Telephone Encounter (Signed)
Called [phone] and left a messaged we tried to reach pt for a follow up call. maw

## 2018-06-28 ENCOUNTER — Encounter: Payer: Self-pay | Admitting: Gastroenterology

## 2018-07-08 ENCOUNTER — Emergency Department (HOSPITAL_COMMUNITY)
Admission: EM | Admit: 2018-07-08 | Discharge: 2018-07-08 | Disposition: A | Discharge status: Home / Self Care | Payer: Medicare HMO | Attending: Emergency Medicine | Admitting: Emergency Medicine

## 2018-07-08 ENCOUNTER — Ambulatory Visit
Admission: RE | Admit: 2018-07-08 | Discharge: 2018-07-08 | Disposition: A | Discharge status: Home / Self Care | Payer: Medicare HMO | Source: Ambulatory Visit | Attending: Family Medicine | Admitting: Family Medicine

## 2018-07-08 ENCOUNTER — Encounter (HOSPITAL_COMMUNITY): Payer: Self-pay

## 2018-07-08 ENCOUNTER — Other Ambulatory Visit: Payer: Self-pay

## 2018-07-08 DIAGNOSIS — Z1231 Encounter for screening mammogram for malignant neoplasm of breast: Secondary | ICD-10-CM

## 2018-07-08 DIAGNOSIS — Z79899 Other long term (current) drug therapy: Secondary | ICD-10-CM | POA: Diagnosis not present

## 2018-07-08 DIAGNOSIS — G441 Vascular headache, not elsewhere classified: Secondary | ICD-10-CM | POA: Insufficient documentation

## 2018-07-08 DIAGNOSIS — I10 Essential (primary) hypertension: Secondary | ICD-10-CM | POA: Insufficient documentation

## 2018-07-08 DIAGNOSIS — R52 Pain, unspecified: Secondary | ICD-10-CM | POA: Diagnosis not present

## 2018-07-08 DIAGNOSIS — R51 Headache: Secondary | ICD-10-CM | POA: Diagnosis not present

## 2018-07-08 HISTORY — DX: Other signs and symptoms in breast: N64.59

## 2018-07-08 MED ORDER — CYCLOBENZAPRINE HCL 10 MG PO TABS
5.0000 mg | ORAL_TABLET | Freq: Once | ORAL | Status: AC
  Administered 2018-07-08: 5 mg via ORAL
  Filled 2018-07-08: qty 1

## 2018-07-08 MED ORDER — CYCLOBENZAPRINE HCL 5 MG PO TABS: 5.0000 mg | ORAL_TABLET | Freq: Three times a day (TID) | ORAL | 0 refills | Status: DC | PRN

## 2018-07-08 MED ORDER — ACETAMINOPHEN 325 MG PO TABS
650.0000 mg | ORAL_TABLET | Freq: Once | ORAL | Status: AC
  Administered 2018-07-08: 650 mg via ORAL
  Filled 2018-07-08: qty 2
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
## 2018-07-08 NOTE — ED Notes (Signed)
MD at bedside. 
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
## 2018-07-08 NOTE — Discharge Instructions (Addendum)
Do not drive while taking the muscle relaxer as it will make you sleepy. Take tylenol in addition for headache and muscle pain. Follow up with your doctor or return here as needed.

## 2018-07-09 DIAGNOSIS — Z6841 Body Mass Index (BMI) 40.0 and over, adult: Secondary | ICD-10-CM | POA: Diagnosis not present

## 2018-07-09 DIAGNOSIS — G8929 Other chronic pain: Secondary | ICD-10-CM | POA: Diagnosis not present

## 2018-07-09 DIAGNOSIS — H04129 Dry eye syndrome of unspecified lacrimal gland: Secondary | ICD-10-CM | POA: Diagnosis not present

## 2018-07-09 DIAGNOSIS — H409 Unspecified glaucoma: Secondary | ICD-10-CM | POA: Diagnosis not present

## 2018-07-09 DIAGNOSIS — I10 Essential (primary) hypertension: Secondary | ICD-10-CM | POA: Diagnosis not present

## 2018-07-09 DIAGNOSIS — G35 Multiple sclerosis: Secondary | ICD-10-CM | POA: Diagnosis not present

## 2018-07-09 DIAGNOSIS — J309 Allergic rhinitis, unspecified: Secondary | ICD-10-CM | POA: Diagnosis not present

## 2018-07-09 DIAGNOSIS — R69 Illness, unspecified: Secondary | ICD-10-CM | POA: Diagnosis not present

## 2018-07-09 DIAGNOSIS — K219 Gastro-esophageal reflux disease without esophagitis: Secondary | ICD-10-CM | POA: Diagnosis not present

## 2018-07-13 ENCOUNTER — Other Ambulatory Visit: Payer: Self-pay | Admitting: Family Medicine

## 2018-07-22 ENCOUNTER — Telehealth: Payer: Self-pay | Admitting: Diagnostic Neuroimaging

## 2018-07-22 NOTE — Telephone Encounter (Signed)
Pt called in to discuss numbness in hands x 1 month. Also with pain in legs for last few months. Not urgent. Patient would like to discuss with dr. Felecia Shelling next week some options for treatment.    Penni Bombard, MD 06/19/4172, 08:14 PM Certified in Neurology, Neurophysiology and Neuroimaging  [facility] [address] [place], [place] [address] [phone]

## 2018-07-25 NOTE — Telephone Encounter (Signed)
Called patient and scheduled work in appt for 08/02/18 at 10:30am, check in 10:00am. She verbalized understanding and appreciation.

## 2018-07-25 NOTE — Telephone Encounter (Signed)
Please see if we can get her in sometime in next week

## 2018-07-27 ENCOUNTER — Telehealth: Payer: Self-pay | Admitting: Neurology

## 2018-07-27 NOTE — Telephone Encounter (Signed)
Dr. Sater- do you have any recommendations? 

## 2018-07-27 NOTE — Telephone Encounter (Signed)
Okay to take her oxycodone tablets.  We will discuss further at her appointment next week.

## 2018-07-27 NOTE — Telephone Encounter (Signed)
Pt has called because of hand and wrist pain, pt has not taken Gabapentin since 04-2018.  Pt is asking if Dr Felecia Shelling thinks that would be okay

## 2018-07-27 NOTE — Telephone Encounter (Signed)
I called and spoke with pt. Hand/wrist pain has worsened. She has pending appt 08/02/18 but would like to know if she can take gabapentin. She was previously on this but she became drowsy on medication and fell. She has oxycodone at home and she advised she is going to take this. OTC meds ineffective. I recommended she wait until I speak with Dr. Felecia Shelling but she states she is going to take an oxycodone. Advised I will call her back tomorrow morning at the latest. She verbalized understanding.

## 2018-07-28 NOTE — Telephone Encounter (Signed)
I called and spoke with pt. Relayed message below. She verbalized understanding.

## 2018-08-02 ENCOUNTER — Telehealth: Payer: Self-pay | Admitting: Neurology

## 2018-08-02 ENCOUNTER — Ambulatory Visit: Payer: Medicare HMO | Admitting: Neurology

## 2018-08-02 ENCOUNTER — Encounter: Payer: Self-pay | Admitting: Neurology

## 2018-08-02 VITALS — BP 111/78 | HR 89 | Ht 66.0 in | Wt 256.5 lb

## 2018-08-02 DIAGNOSIS — R5383 Other fatigue: Secondary | ICD-10-CM

## 2018-08-02 DIAGNOSIS — M5416 Radiculopathy, lumbar region: Secondary | ICD-10-CM | POA: Diagnosis not present

## 2018-08-02 DIAGNOSIS — R260 Ataxic gait: Secondary | ICD-10-CM

## 2018-08-02 DIAGNOSIS — G35 Multiple sclerosis: Secondary | ICD-10-CM

## 2018-08-02 DIAGNOSIS — F329 Major depressive disorder, single episode, unspecified: Secondary | ICD-10-CM

## 2018-08-02 DIAGNOSIS — R200 Anesthesia of skin: Secondary | ICD-10-CM | POA: Insufficient documentation

## 2018-08-02 DIAGNOSIS — R69 Illness, unspecified: Secondary | ICD-10-CM | POA: Diagnosis not present

## 2018-08-02 DIAGNOSIS — F32A Depression, unspecified: Secondary | ICD-10-CM

## 2018-08-02 MED ORDER — MODAFINIL 200 MG PO TABS: ORAL_TABLET | ORAL | 5 refills | Status: DC
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
## 2018-08-02 NOTE — Telephone Encounter (Signed)
[employer] medicare order sent to [facility]. They will obtain the auth and reach out to the pt to schedule.  °

## 2018-08-03 ENCOUNTER — Other Ambulatory Visit: Payer: Self-pay | Admitting: Family Medicine

## 2018-08-03 ENCOUNTER — Ambulatory Visit: Payer: Medicare HMO | Admitting: Gynecology

## 2018-08-03 ENCOUNTER — Encounter: Payer: Self-pay | Admitting: Gynecology

## 2018-08-03 ENCOUNTER — Ambulatory Visit (INDEPENDENT_AMBULATORY_CARE_PROVIDER_SITE_OTHER): Payer: Medicare HMO

## 2018-08-03 VITALS — BP 130/76

## 2018-08-03 DIAGNOSIS — N858 Other specified noninflammatory disorders of uterus: Secondary | ICD-10-CM | POA: Diagnosis not present

## 2018-08-03 DIAGNOSIS — N950 Postmenopausal bleeding: Secondary | ICD-10-CM

## 2018-08-03 DIAGNOSIS — D259 Leiomyoma of uterus, unspecified: Secondary | ICD-10-CM

## 2018-08-03 NOTE — Progress Notes (Signed)
    Sydney Pace 06/19/1955 [id number]        63 y.o.  G3P0030 presents for sonohysterogram.  Had episode of postmenopausal bleeding end of last year lasting 1 to 2 days.  Prior history of endometrial polyps.  She notes since then she is done no further bleeding  Past medical history,surgical history, problem list, medications, allergies, family history and social history were all reviewed and documented in the EPIC chart.  Directed ROS with pertinent positives and negatives documented in the history of present illness/assessment and plan.  Exam: Pam  Falls assistant BP 130/76 General appearance:  Normal Abdomen soft nontender without masses guarding rebound Pelvic external BUS vagina with atrophic changes.  Cervix with atrophic changes.  Uterus difficult to palpate but no gross masses or tenderness.  Ultrasound transvaginal and transabdominal shows uterus retroverted grossly normal in size with multiple small myomas the largest measuring 36 x 32 mm.  Endometrial echo 4.5 mm.  Right and left ovaries are normal.  Cul-de-sac negative  Sonohysterogram performed, sterile technique, easy catheter introduction, good distention with no abnormalities.  Endometrial sample taken.  Patient tolerated well.  Assessment/Plan:  63 y.o. G3P0030 with episode of postmenopausal bleeding several months ago.  No bleeding since.  Ultrasound shows multiple small myomas.  Endometrial echo relatively thin at 4.5 mm.  Cavity is empty.  Endometrial biopsy taken.  Patient will follow-up for results.  Assuming negative then plan expectant management with reporting of any further bleeding.    Anastasio Auerbach MD, 10:02 AM 08/03/2018

## 2018-08-03 NOTE — Patient Instructions (Signed)
Office will call you with biopsy results 

## 2018-08-03 NOTE — Telephone Encounter (Signed)
error 

## 2018-08-04 ENCOUNTER — Encounter: Payer: Self-pay | Admitting: Gynecology

## 2018-08-11 ENCOUNTER — Other Ambulatory Visit: Payer: Self-pay | Admitting: Family Medicine

## 2018-08-15 ENCOUNTER — Ambulatory Visit: Payer: Medicare HMO | Admitting: Neurology

## 2018-08-19 ENCOUNTER — Other Ambulatory Visit: Payer: Medicare HMO

## 2018-08-22 NOTE — Telephone Encounter (Signed)
Bernadene Person Josem Kaufmann: [id number] (exp. 08/19/18 to 02/15/19)

## 2018-08-31 ENCOUNTER — Encounter: Payer: Medicare HMO | Admitting: Neurology

## 2018-09-01 ENCOUNTER — Telehealth: Payer: Self-pay | Admitting: Neurology

## 2018-09-01 ENCOUNTER — Ambulatory Visit
Admission: RE | Admit: 2018-09-01 | Discharge: 2018-09-01 | Disposition: A | Discharge status: Home / Self Care | Payer: Medicare HMO | Source: Ambulatory Visit | Attending: Neurology | Admitting: Neurology

## 2018-09-01 ENCOUNTER — Other Ambulatory Visit: Payer: Self-pay

## 2018-09-01 DIAGNOSIS — R200 Anesthesia of skin: Secondary | ICD-10-CM

## 2018-09-01 DIAGNOSIS — M5416 Radiculopathy, lumbar region: Secondary | ICD-10-CM | POA: Diagnosis not present

## 2018-09-01 NOTE — Telephone Encounter (Signed)
Called pt.  She is having bilateral leg pain, also numbness/ tingling in hands.  She stated her MRI lumbar done today.  I relayed this will be read by Dr. Felecia Shelling and then she will be called with results.  She states she takes aleve and it helps some ( but causes some intolerance with stomach), I told her to take with food.  She is out of the oxycodone which she had gotten from Dr. Marlou Sa when had wrist surgery. She also asked about getting more ritalin 20mg  po tid (she was on this previously prior to provigil. Please advise.  Drug registry checked provigil last fill 08/02/18 #60.  Ritalin 20mg  po tid, last fill 09-16-2017.

## 2018-09-01 NOTE — Telephone Encounter (Signed)
Pt called wanting a pain medication called in for her because she is having pain that is not easing up. Please advise.

## 2018-09-02 ENCOUNTER — Telehealth: Payer: Self-pay | Admitting: Neurology

## 2018-09-02 MED ORDER — METHYLPHENIDATE HCL 20 MG PO TABS: ORAL_TABLET | ORAL | 0 refills | Status: DC

## 2018-09-02 MED ORDER — OXYCODONE HCL 5 MG PO CAPS: 5.0000 mg | ORAL_CAPSULE | Freq: Two times a day (BID) | ORAL | 0 refills | Status: DC | PRN

## 2018-09-02 NOTE — Addendum Note (Signed)
Addended by: Arlice Colt A on: 09/02/2018 10:52 AM   Modules accepted: Orders

## 2018-09-02 NOTE — Telephone Encounter (Signed)
Copied from [place] [id number]. Topic: Appointment Scheduling - Transfer of Care >> Sep 02, 2018  2:24 PM Reyne Dumas L wrote: Pt is requesting to transfer FROM: Dr. Charlett Blake Pt is requesting to transfer TO: Dr. Rogers Blocker Reason for requested transfer: closer to home  Pt doesn't need immediate visit but will need physical later on.  Send CRM to patient's current PCP (transferring FROM).

## 2018-09-02 NOTE — Telephone Encounter (Signed)
I spoke to Sydney Pace about the MRI.  She has 9 to 10 mm of anterolisthesis of L4-L5 due to very severe facet hypertrophy.  This causes mild spinal stenosis but moderately severe lateral recess stenosis, a little worse on the right than the left.  She also has severe facet hypertrophy at L5-S1.  The degenerative changes likely explain her back pain and leg pain and possibly the right leg giving out at times.  She feels the pain is tolerable at this point.  We discussed that I would refer her to a neurosurgeon for an evaluation if symptoms worsen.

## 2018-09-10 ENCOUNTER — Other Ambulatory Visit: Payer: Self-pay | Admitting: Family Medicine

## 2018-09-25 ENCOUNTER — Ambulatory Visit (HOSPITAL_COMMUNITY)
Admission: EM | Admit: 2018-09-25 | Discharge: 2018-09-25 | Disposition: A | Discharge status: Home / Self Care | Payer: Medicare HMO | Attending: Family Medicine | Admitting: Family Medicine

## 2018-09-25 ENCOUNTER — Encounter (HOSPITAL_COMMUNITY): Payer: Self-pay | Admitting: Emergency Medicine

## 2018-09-25 ENCOUNTER — Other Ambulatory Visit: Payer: Self-pay

## 2018-09-25 DIAGNOSIS — B029 Zoster without complications: Secondary | ICD-10-CM

## 2018-09-25 MED ORDER — VALACYCLOVIR HCL 1 G PO TABS: 1000.0000 mg | ORAL_TABLET | Freq: Three times a day (TID) | ORAL | 0 refills | Status: DC

## 2018-09-25 NOTE — ED Provider Notes (Signed)
[facility]    CSN: [id number] Arrival date & time: 09/25/18  1134     History   Chief Complaint Chief Complaint  Patient presents with  . Possible Shingles    HPI Sydney Pace is a 63 y.o. female.   HPI  She noticed a rash under her left breast yesterday.  Today it felt like it had spread to her back.  She looked in the mirror and saw a large area that was red and it is becoming more painful.  She has not had a shingles shot. She has oxycodone to take for pain because of a history of spinal stenosis.  She does not take it every day.  She has plenty of pain medicine at home. She has been tried on gabapentin before for nerve pain.  She states it made her drowsy and she thinks it might have contributed to a fall.  She is willing to try it again, with precautions, if it will help with her current pain.  Past Medical History:  Diagnosis Date  . Allergy   . Anemia   . Arthritis   . Arthritis 07/06/2017  . Constipation    occ  . Dehydration 06/09/2016  . Depression   . Esophageal reflux 08/08/2012  . GERD (gastroesophageal reflux disease)   . Hypertension   . Inverted nipple    right nipple has always been inverted - per pt  . MS (multiple sclerosis) ([place])   . Neuromuscular disorder ([place])    MS  . Other and unspecified hyperlipidemia 08/08/2012  . Thyroid disease     Patient Active Problem List   Diagnosis Date Noted  . Lumbar radiculopathy 08/02/2018  . Numbness 08/02/2018  . Post-menopausal bleeding 03/17/2018  . Dysuria 03/17/2018  . High serum parathyroid hormone (PTH) 01/10/2018  . Hypercalcemia 01/10/2018  . Chronic heel pain, left 07/06/2017  . Arthritis 07/06/2017  . Osteopenia 07/06/2017  . Hx of colonic polyp 07/06/2017  . Cervical cancer screening 07/06/2017  . Obesity 07/06/2017  . Dry eyes, bilateral 10/10/2015  . Low-tension glaucoma of both eyes, moderate stage 10/10/2015  . Nuclear sclerotic cataract of both eyes 10/10/2015  . 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 Insomnia 08/16/2014  . Optic neuritis 08/16/2014  . Ataxic gait 08/16/2014  . Other fatigue 08/16/2014  . Abdominal pain, chronic, epigastric 04/03/2014  . Colon cancer screening 04/03/2014  . Helicobacter pylori (H. pylori) infection 03/21/2014  . Preventative health care 08/27/2013  . Sinusitis 08/08/2012  . Hyperlipidemia, mixed 08/08/2012  . Allergic state 08/08/2012  . Esophageal reflux 08/08/2012  . Depression 01/23/2011  . Leg swelling 01/23/2011  . Multiple sclerosis ([place]) 09/23/2010  . Essential hypertension 09/23/2010  . Overactive bladder 09/23/2010  . Thyroid nodule 09/23/2010  . Pulmonary nodule 09/23/2010  . Fatigue 09/23/2010    Past Surgical History:  Procedure Laterality Date  . COLONOSCOPY    . HYSTEROSCOPY  02, 04, 2008   with D&C  . ORIF WRIST FRACTURE Right 04/07/2018   Procedure: RIGHT WRIST OPEN REDUCTION INTERNAL FIXATION (ORIF);  Surgeon: Meredith Pel, MD;  Location: [facility];  Service: Orthopedics;  Laterality: Right;  . TMJ ARTHROSCOPY    . TONSILLECTOMY    . UPPER GASTROINTESTINAL ENDOSCOPY      OB History    Gravida  3   Para      Term      Preterm      AB  3   Living  0  SAB  1   TAB  2   Ectopic      Multiple      Live Births               Home Medications    Prior to Admission medications   Medication Sig Start Date End Date Taking? Authorizing Provider  AZO-CRANBERRY PO Take by mouth.    [provider]  brimonidine (ALPHAGAN) 0.2 % ophthalmic solution Place 1 drop into both eyes 2 (two) times daily.    [provider]  Budesonide (RHINOCORT ALLERGY NA) Place 1 spray into the nose daily.    [provider]  buPROPion (WELLBUTRIN XL) 300 MG 24 hr tablet Take 1 tablet (300 mg total) by mouth daily. 02/14/18   Sater, Nanine Means, MD  Carboxymethylcellul-Glycerin (LUBRICATING EYE DROPS OP) Place 1 drop into both eyes daily as needed (dry eyes).     [provider]  cetirizine (ZYRTEC) 10 MG tablet Take 10 mg by mouth every evening.     [provider]  Cholecalciferol (DIALYVITE VITAMIN D 5000 PO) Take 5,000 Units by mouth daily.    [provider]  docusate sodium (COLACE) 100 MG capsule Take 100 mg by mouth 2 (two) times daily.    [provider]  furosemide (LASIX) 20 MG tablet Take 1 tablet (20 mg total) by mouth daily as needed. Patient taking differently: Take 20 mg by mouth daily as needed for edema.  01/10/18   [name], MD  lisinopril-hydrochlorothiazide (PRINZIDE,ZESTORETIC) 20-25 MG tablet Take 1 tablet by mouth once daily 08/04/18   [name], MD  methylphenidate (RITALIN) 20 MG tablet One po qAM and one po qNoon 09/02/18   Sater, Nanine Means, MD  modafinil (PROVIGIL) 200 MG tablet One po in AM and at noon 08/02/18   Sater, Nanine Means, MD  Multiple Vitamin (MULTIVITAMIN) tablet Take 1 tablet by mouth daily.    [provider]  naproxen sodium (ALEVE) 220 MG tablet Take 220 mg by mouth daily as needed (pain).    [provider]  oxybutynin (DITROPAN) 5 MG tablet Take 1 tablet (5 mg total) by mouth 2 (two) times daily. 02/14/18   Sater, Nanine Means, MD  oxycodone (OXY-IR) 5 MG capsule Take 1 capsule (5 mg total) by mouth 2 (two) times daily as needed. 09/02/18   Sater, Nanine Means, MD  Probiotic Product (PROBIOTIC DAILY PO) Take by mouth.    [provider]  sertraline (ZOLOFT) 100 MG tablet Take 1 tablet by mouth once daily 09/12/18   [name], MD  valACYclovir (VALTREX) 1000 MG tablet Take 1 tablet (1,000 mg total) by mouth 3 (three) times daily. 09/25/18   Raylene Everts, MD    Family History Family History  Problem Relation Age of Onset  . Heart disease Mother        pacemaker  . Emphysema Mother   . Hypertension Mother   . Heart disease Father   . Diabetes Father   . Heart disease Sister        cad  . Sleep apnea Brother   . Colon cancer Neg Hx   .  Esophageal cancer Neg Hx   . Rectal cancer Neg Hx   . Stomach cancer Neg Hx   . Colon polyps Neg Hx     Social History Social History   Tobacco Use  . Smoking status: Never Smoker  . Smokeless tobacco: Never Used  Substance Use Topics  .  Alcohol use: Yes    Alcohol/week: 0.0 standard drinks    Comment: occassional  . Drug use: No     Allergies   Shellfish allergy   Review of Systems Review of Systems  Constitutional: Negative for chills and fever.  HENT: Negative for ear pain and sore throat.   Eyes: Negative for pain and visual disturbance.  Respiratory: Negative for cough and shortness of breath.   Cardiovascular: Negative for chest pain and palpitations.  Gastrointestinal: Negative for abdominal pain and vomiting.  Genitourinary: Negative for dysuria and hematuria.  Musculoskeletal: Negative for arthralgias and back pain.  Skin: Positive for rash. Negative for color change.  Neurological: Negative for seizures and syncope.  All other systems reviewed and are negative.    Physical Exam Triage Vital Signs ED Triage Vitals  Enc Vitals Group     BP 09/25/18 1146 121/65     Pulse Rate 09/25/18 1145 82     Resp 09/25/18 1145 18     Temp 09/25/18 1145 98.3 F (36.8 C)     Temp src --      SpO2 09/25/18 1145 100 %     Weight --      Height --      Head Circumference --      Peak Flow --      Pain Score 09/25/18 1145 9     Pain Loc --      Pain Edu? --      Excl. in [place]? --    No data found.  Updated Vital Signs BP 121/65   Pulse 82   Temp 98.3 F (36.8 C)   Resp 18   LMP  (LMP Unknown)   SpO2 100%       Visual Acuity Right Eye Distance:   Left Eye Distance:   Bilateral Distance:    Right Eye Near:   Left Eye Near:    Bilateral Near:     Physical Exam Constitutional:      General: She is not in acute distress.    Appearance: She is well-developed.  HENT:     Head: Normocephalic and atraumatic.  Eyes:     Conjunctiva/sclera:  Conjunctivae normal.     Pupils: Pupils are equal, round, and reactive to light.  Neck:     Musculoskeletal: Normal range of motion.  Cardiovascular:     Rate and Rhythm: Normal rate and regular rhythm.     Heart sounds: Normal heart sounds.  Pulmonary:     Effort: Pulmonary effort is normal. No respiratory distress.     Breath sounds: Normal breath sounds.  Abdominal:     General: There is no distension.     Palpations: Abdomen is soft.  Musculoskeletal: Normal range of motion.  Skin:    General: Skin is warm and dry.     Findings: Rash present.     Comments: The rash extends from underneath the left breast, around the flank, to the back.  See pictures  Neurological:     Mental Status: She is alert.      UC Treatments / Results  Labs (all labs ordered are listed, but only abnormal results are displayed) Labs Reviewed - No data to display  EKG None  Radiology No results found.  Procedures Procedures (including critical care time)  Medications Ordered in UC Medications - No data to display  Initial Impression / Assessment and Plan / UC Course  I have reviewed the triage vital signs and the nursing notes.  Pertinent labs & imaging results that were available during my care of the patient were reviewed by me and considered in my medical decision making (see chart for details).     Discussed Management.  Follow up with PCP Final Clinical Impressions(s) / UC Diagnoses   Final diagnoses:  Herpes zoster without complication     Discharge Instructions     I have sent Valtrex, the antiviral antibiotic coverage to your pharmacy.  You will take this 3 times a day.  Make sure you get 2 doses in today. Continue your oxycodone as needed for pain. Shingles information attached Call your doctor tomorrow to notify them of this condition.  They may want to see you later in the week.    ED Prescriptions    Medication Sig Dispense Auth. Provider   valACYclovir (VALTREX)  1000 MG tablet Take 1 tablet (1,000 mg total) by mouth 3 (three) times daily. 21 tablet Raylene Everts, MD     Controlled Substance Prescriptions [place] Controlled Substance Registry consulted? Not Applicable   Raylene Everts, MD 09/25/18 1406

## 2018-09-25 NOTE — ED Triage Notes (Signed)
Pt states she noticed a rash under her breast yesterday and woke up this morning with it all over her back, rash is located on L side of back only, under breast.

## 2018-09-25 NOTE — Discharge Instructions (Signed)
I have sent Valtrex, the antiviral antibiotic coverage to your pharmacy.  You will take this 3 times a day.  Make sure you get 2 doses in today. Continue your oxycodone as needed for pain. Shingles information attached Call your doctor tomorrow to notify them of this condition.  They may want to see you later in the week.

## 2018-09-30 ENCOUNTER — Other Ambulatory Visit: Payer: Self-pay | Admitting: Family Medicine

## 2018-09-30 ENCOUNTER — Telehealth: Payer: Self-pay

## 2018-09-30 DIAGNOSIS — B029 Zoster without complications: Secondary | ICD-10-CM

## 2018-09-30 MED ORDER — GABAPENTIN 100 MG PO CAPS: 100.0000 mg | ORAL_CAPSULE | Freq: Three times a day (TID) | ORAL | 3 refills | Status: DC

## 2018-09-30 MED ORDER — VALACYCLOVIR HCL 1 G PO TABS: 1000.0000 mg | ORAL_TABLET | Freq: Three times a day (TID) | ORAL | 0 refills | Status: DC

## 2018-09-30 NOTE — Telephone Encounter (Signed)
Please advise 

## 2018-09-30 NOTE — Telephone Encounter (Signed)
Copied from [place] [id number]. Topic: General - Other >> Sep 29, 2018  3:53 PM Alanda Slim E wrote: Reason for CRM: Pt went to ER on Sunday and she has shingles down the left side and middle of back as well as under left breast and now she is experiencing a throbbing ear ache on the same side as her shingles. Pt wonders if this is related and what can be done to help. / please advise

## 2018-09-30 NOTE — Telephone Encounter (Signed)
Left message for patient to call the office back   Nurse triage may handle  

## 2018-09-30 NOTE — Telephone Encounter (Signed)
The ear pain could be related. I have extended the valtrex by another week since she is still so symptomatic. I started Gabapentin 100 mg to help the nerve pain associated with this. Start by taking at night if does not make her overly sleepy then add doses during the day and can take 3 x a day. Then have her see me next week so we can assess if this is helping and we can increase the doses to higher strengths if need be or try something else.

## 2018-10-03 ENCOUNTER — Encounter: Payer: Self-pay | Admitting: Family Medicine

## 2018-10-03 NOTE — Telephone Encounter (Signed)
Follow up call made to patient.Given instructions per Dr. Charlett Blake. Appointment scheduled for 10/07/18. Reviewed instructions for Web visit.

## 2018-10-03 NOTE — Telephone Encounter (Signed)
Pt returned call from Friday. NT unavailable to speak with pt. Please advise.

## 2018-10-07 ENCOUNTER — Other Ambulatory Visit: Payer: Self-pay

## 2018-10-07 ENCOUNTER — Ambulatory Visit (INDEPENDENT_AMBULATORY_CARE_PROVIDER_SITE_OTHER): Payer: Medicare HMO | Admitting: Family Medicine

## 2018-10-07 DIAGNOSIS — I10 Essential (primary) hypertension: Secondary | ICD-10-CM

## 2018-10-07 DIAGNOSIS — G35 Multiple sclerosis: Secondary | ICD-10-CM | POA: Diagnosis not present

## 2018-10-07 DIAGNOSIS — B029 Zoster without complications: Secondary | ICD-10-CM | POA: Diagnosis not present

## 2018-10-07 DIAGNOSIS — G4700 Insomnia, unspecified: Secondary | ICD-10-CM | POA: Diagnosis not present

## 2018-10-07 MED ORDER — GABAPENTIN 100 MG PO CAPS: 100.0000 mg | ORAL_CAPSULE | Freq: Two times a day (BID) | ORAL | 3 refills | Status: DC

## 2018-10-07 MED ORDER — GABAPENTIN 600 MG PO TABS: 600.0000 mg | ORAL_TABLET | Freq: Every day | ORAL | 2 refills | Status: DC

## 2018-10-09 DIAGNOSIS — B029 Zoster without complications: Secondary | ICD-10-CM | POA: Insufficient documentation

## 2018-10-09 NOTE — Assessment & Plan Note (Signed)
Encouraged to stay in quarantine and to mask and engage in frequent hand washing when she needs to go out due to her comorbidities.

## 2018-10-09 NOTE — Assessment & Plan Note (Signed)
Encouraged good sleep hygiene such as dark, quiet room. No blue/green glowing lights such as computer screens in bedroom. No alcohol or stimulants in evening. Cut down on caffeine as able. Regular exercise is helpful but not just prior to bed time.  

## 2018-10-09 NOTE — Assessment & Plan Note (Addendum)
Her pain is still overwhelming under her left breast the Gabapentin at night has been some helpful but it is too sedating to take during the day. Will continue the 600 mg dose qhs and decrease the strength of other doses to 100 mg po bid during the day.

## 2018-10-09 NOTE — Progress Notes (Addendum)
Virtual Visit via Telephone Note  I connected with Sydney Pace on 10/07/2018 by a telephone enabled telemedicine application and verified that I am speaking with the correct person using two identifiers. Sydney Pace, CMA tried to get the patient set up on a video call but patient was unable so visit was completed on telephone  Location: Patient: home Provider: home   I discussed the limitations of evaluation and management by telemedicine and the availability of in person appointments. The patient expressed understanding and agreed to proceed.    Subjective:    Patient ID: Sydney Pace, female    DOB: Oct 06, 1955, 63 y.o.   MRN: [id number]  No chief complaint on file.   HPI Patient is in today for ER follow up after she was seen and treated for a shingles outbreak under her left breast. She has finished her antiviral and notes the Gabapentin 600 mg helps some but she cannot take it during the day because she becomes too somnolent. No fevers or new concerns but she is having trouble sleeping due to pain. Denies CP/palp/SOB/HA/congestion/fevers/GI or GU c/o. Taking meds as prescribed  Past Medical History:  Diagnosis Date  . Allergy   . Anemia   . Arthritis   . Arthritis 07/06/2017  . Constipation    occ  . Dehydration 06/09/2016  . Depression   . Esophageal reflux 08/08/2012  . GERD (gastroesophageal reflux disease)   . Hypertension   . Inverted nipple    right nipple has always been inverted - per pt  . MS (multiple sclerosis) ([place])   . Neuromuscular disorder ([place])    MS  . Other and unspecified hyperlipidemia 08/08/2012  . Thyroid disease     Past Surgical History:  Procedure Laterality Date  . COLONOSCOPY    . HYSTEROSCOPY  02, 04, 2008   with D&C  . ORIF WRIST FRACTURE Right 04/07/2018   Procedure: RIGHT WRIST OPEN REDUCTION INTERNAL FIXATION (ORIF);  Surgeon: Meredith Pel, MD;  Location: [facility];  Service: Orthopedics;  Laterality: Right;  . TMJ ARTHROSCOPY     . TONSILLECTOMY    . UPPER GASTROINTESTINAL ENDOSCOPY      Family History  Problem Relation Age of Onset  . Heart disease Mother        pacemaker  . Emphysema Mother   . Hypertension Mother   . Heart disease Father   . Diabetes Father   . Heart disease Sister        cad  . Sleep apnea Brother   . Colon cancer Neg Hx   . Esophageal cancer Neg Hx   . Rectal cancer Neg Hx   . Stomach cancer Neg Hx   . Colon polyps Neg Hx     Social History   Socioeconomic History  . Marital status: Single    Spouse name: Not on file  . Number of children: Not on file  . Years of education: Not on file  . Highest education level: Not on file  Occupational History  . Not on file  Social Needs  . Financial resource strain: Not on file  . Food insecurity:    Worry: Not on file    Inability: Not on file  . Transportation needs:    Medical: Not on file    Non-medical: Not on file  Tobacco Use  . Smoking status: Never Smoker  . Smokeless tobacco: Never Used  Substance and Sexual Activity  . Alcohol use: Yes    Alcohol/week: 0.0 standard drinks  Comment: occassional  . Drug use: No  . Sexual activity: Never    Comment: 1st intercourse 63 yo-5 partners  Lifestyle  . Physical activity:    Days per week: Not on file    Minutes per session: Not on file  . Stress: Not on file  Relationships  . Social connections:    Talks on phone: Not on file    Gets together: Not on file    Attends religious service: Not on file    Active member of club or organization: Not on file    Attends meetings of clubs or organizations: Not on file    Relationship status: Not on file  . Intimate partner violence:    Fear of current or ex partner: Not on file    Emotionally abused: Not on file    Physically abused: Not on file    Forced sexual activity: Not on file  Other Topics Concern  . Not on file  Social History Narrative  . Not on file    Outpatient Medications Prior to Visit  Medication  Sig Dispense Refill  . AZO-CRANBERRY PO Take by mouth.    . brimonidine (ALPHAGAN) 0.2 % ophthalmic solution Place 1 drop into both eyes 2 (two) times daily.    . Budesonide (RHINOCORT ALLERGY NA) Place 1 spray into the nose daily.    Marland Kitchen buPROPion (WELLBUTRIN XL) 300 MG 24 hr tablet Take 1 tablet (300 mg total) by mouth daily. 30 tablet 11  . Carboxymethylcellul-Glycerin (LUBRICATING EYE DROPS OP) Place 1 drop into both eyes daily as needed (dry eyes).    . cetirizine (ZYRTEC) 10 MG tablet Take 10 mg by mouth every evening.     . Cholecalciferol (DIALYVITE VITAMIN D 5000 PO) Take 5,000 Units by mouth daily.    Marland Kitchen docusate sodium (COLACE) 100 MG capsule Take 100 mg by mouth 2 (two) times daily.    . furosemide (LASIX) 20 MG tablet Take 1 tablet (20 mg total) by mouth daily as needed. (Patient taking differently: Take 20 mg by mouth daily as needed for edema. ) 90 tablet 1  . lisinopril-hydrochlorothiazide (PRINZIDE,ZESTORETIC) 20-25 MG tablet Take 1 tablet by mouth once daily 90 tablet 0  . methylphenidate (RITALIN) 20 MG tablet One po qAM and one po qNoon 60 tablet 0  . modafinil (PROVIGIL) 200 MG tablet One po in AM and at noon 60 tablet 5  . Multiple Vitamin (MULTIVITAMIN) tablet Take 1 tablet by mouth daily.    . naproxen sodium (ALEVE) 220 MG tablet Take 220 mg by mouth daily as needed (pain).    Marland Kitchen oxybutynin (DITROPAN) 5 MG tablet Take 1 tablet (5 mg total) by mouth 2 (two) times daily. 180 tablet 3  . oxycodone (OXY-IR) 5 MG capsule Take 1 capsule (5 mg total) by mouth 2 (two) times daily as needed. 60 capsule 0  . Probiotic Product (PROBIOTIC DAILY PO) Take by mouth.    . sertraline (ZOLOFT) 100 MG tablet Take 1 tablet by mouth once daily 30 tablet 0  . valACYclovir (VALTREX) 1000 MG tablet Take 1 tablet (1,000 mg total) by mouth 3 (three) times daily. 21 tablet 0  . gabapentin (NEURONTIN) 100 MG capsule Take 1 capsule (100 mg total) by mouth 3 (three) times daily. 90 capsule 3   No  facility-administered medications prior to visit.     Allergies  Allergen Reactions  . Shellfish Allergy Hives, Shortness Of Breath, Itching, Swelling and Rash    Pt had previously  carried an epi-pen and had a history of severe reaction to shrimp with breathing problems and swelling of lips and tongue     Review of Systems  Constitutional: Positive for malaise/fatigue. Negative for fever.  HENT: Negative for congestion.   Eyes: Negative for blurred vision.  Respiratory: Negative for shortness of breath.   Cardiovascular: Negative for chest pain, palpitations and leg swelling.  Gastrointestinal: Negative for abdominal pain, blood in stool and nausea.  Genitourinary: Negative for dysuria and frequency.  Musculoskeletal: Positive for joint pain and myalgias. Negative for falls.  Skin: Negative for rash.  Neurological: Positive for sensory change. Negative for dizziness, loss of consciousness and headaches.  Endo/Heme/Allergies: Negative for environmental allergies.  Psychiatric/Behavioral: Negative for depression. The patient is not nervous/anxious.        Objective:    Physical Exam unable to obtain due to telephone platform  LMP  (LMP Unknown)  Wt Readings from Last 3 Encounters:  08/02/18 256 lb 8 oz (116.3 kg)  07/08/18 279 lb 15.8 oz (127 kg)  06/17/18 280 lb (127 kg)    Diabetic Foot Exam - Simple   No data filed     Lab Results  Component Value Date   WBC 7.9 04/07/2018   HGB 12.3 04/07/2018   HCT 38.2 04/07/2018   PLT 250 04/07/2018   GLUCOSE 91 04/07/2018   CHOL 161 01/10/2018   TRIG 67.0 01/10/2018   HDL 45.10 01/10/2018   LDLCALC 103 (H) 01/10/2018   ALT 11 01/10/2018   AST 16 01/10/2018   NA 137 04/07/2018   K 3.7 04/07/2018   CL 104 04/07/2018   CREATININE 1.08 (H) 04/07/2018   BUN 13 04/07/2018   CO2 22 04/07/2018   TSH 0.62 01/10/2018   HGBA1C 5.8 01/10/2018   MICROALBUR 1.17 01/02/2009    Lab Results  Component Value Date   TSH 0.62  01/10/2018   Lab Results  Component Value Date   WBC 7.9 04/07/2018   HGB 12.3 04/07/2018   HCT 38.2 04/07/2018   MCV 91.8 04/07/2018   PLT 250 04/07/2018   Lab Results  Component Value Date   NA 137 04/07/2018   K 3.7 04/07/2018   CO2 22 04/07/2018   GLUCOSE 91 04/07/2018   BUN 13 04/07/2018   CREATININE 1.08 (H) 04/07/2018   BILITOT 0.4 01/10/2018   ALKPHOS 87 01/10/2018   AST 16 01/10/2018   ALT 11 01/10/2018   PROT 7.7 01/10/2018   ALBUMIN 4.0 01/10/2018   CALCIUM 9.8 04/07/2018   ANIONGAP 11 04/07/2018   GFR 66.61 01/10/2018   Lab Results  Component Value Date   CHOL 161 01/10/2018   Lab Results  Component Value Date   HDL 45.10 01/10/2018   Lab Results  Component Value Date   LDLCALC 103 (H) 01/10/2018   Lab Results  Component Value Date   TRIG 67.0 01/10/2018   Lab Results  Component Value Date   CHOLHDL 4 01/10/2018   Lab Results  Component Value Date   HGBA1C 5.8 01/10/2018       Assessment & Plan:   Problem List Items Addressed This Visit    Multiple sclerosis ([place])    Encouraged to stay in quarantine and to mask and engage in frequent hand washing when she needs to go out due to her comorbidities.       Essential hypertension    no changes to meds. Encouraged heart healthy diet such as the DASH diet and exercise as tolerated.  Insomnia    Encouraged good sleep hygiene such as dark, quiet room. No blue/green glowing lights such as computer screens in bedroom. No alcohol or stimulants in evening. Cut down on caffeine as able. Regular exercise is helpful but not just prior to bed time.       Shingles    Her pain is still overwhelming under her left breast the Gabapentin at night has been some helpful but it is too sedating to take during the day. Will continue the 600 mg dose qhs and decrease the strength of other doses to 100 mg po bid during the day.         I have changed Oluwanifemi Reznick's gabapentin. I am also having her  start on gabapentin. Additionally, I am having her maintain her multivitamin, cetirizine, furosemide, oxybutynin, buPROPion, Cholecalciferol (DIALYVITE VITAMIN D 5000 PO), docusate sodium, naproxen sodium, Carboxymethylcellul-Glycerin (LUBRICATING EYE DROPS OP), brimonidine, Budesonide (RHINOCORT ALLERGY NA), Probiotic Product (PROBIOTIC DAILY PO), AZO-CRANBERRY PO, modafinil, lisinopril-hydrochlorothiazide, oxycodone, methylphenidate, sertraline, and valACYclovir.  Meds ordered this encounter  Medications  . gabapentin (NEURONTIN) 600 MG tablet    Sig: Take 1 tablet (600 mg total) by mouth at bedtime.    Dispense:  30 tablet    Refill:  2  . gabapentin (NEURONTIN) 100 MG capsule    Sig: Take 1 capsule (100 mg total) by mouth 2 (two) times daily.    Dispense:  90 capsule    Refill:  3    I discussed the assessment and treatment plan with the patient. The patient was provided an opportunity to ask questions and all were answered. The patient agreed with the plan and demonstrated an understanding of the instructions.   The patient was advised to call back or seek an in-person evaluation if the symptoms worsen or if the condition fails to improve as anticipated.  I provided 25 minutes of non-face-to-face time during this encounter.   Penni Homans, MD

## 2018-10-09 NOTE — Assessment & Plan Note (Signed)
no changes to meds. Encouraged heart healthy diet such as the DASH diet and exercise as tolerated.  

## 2018-10-10 ENCOUNTER — Telehealth: Payer: Self-pay

## 2018-10-10 NOTE — Telephone Encounter (Signed)
I called and left a detailed message for patient to call us back to reschedule their nerve conduction study with Dr. Jannifer Franklin that was originally scheduled for April 1st.

## 2018-10-24 ENCOUNTER — Other Ambulatory Visit: Payer: Self-pay | Admitting: Family Medicine

## 2018-11-29 ENCOUNTER — Other Ambulatory Visit: Payer: Self-pay | Admitting: Family Medicine

## 2018-12-05 ENCOUNTER — Telehealth: Payer: Self-pay

## 2018-12-05 NOTE — Telephone Encounter (Signed)
Per Dr. Felecia Shelling- He recommends having her increase her gabapentin to 300mg  in the morning and afternoon and 600mg  at bedtime. She is already on valtrex which he will normally prescribe as well

## 2018-12-05 NOTE — Telephone Encounter (Signed)
Pt called to report she is still struggling with the shingles. Pt states her PCP is involved in the treatment but wanted to know if Dr. Felecia Shelling could provide any further recommendations? Pt also states she needs a refill of Ritalin 20 mg preferred pharmacy~ [facility].

## 2018-12-05 NOTE — Telephone Encounter (Signed)
Called pt.  She is on gabapentin 100mg  TID and does not want to increase her dose. She has had shingles since May. Located on left breast, back and right bottocks. She has not contacted PCP recently. She is frustrated because she still has shingles. Wanting to know what Dr. Felecia Shelling would recommend. I placed her on hold to speak with Dr. Felecia Shelling.   Relayed per Dr. Felecia Shelling: "Pain from shingles can last for many months.  Even the skin changes may take a couple months to resolve.   Additional Valtrex is unlikely to help.    Besides gabapentin increase, we could also call in Lidoderm patches #60 apply two patches a day to the affected areas (if pain area is flat) or lidocaine ointment 5% (50grams) apply twice daily  if under breast" Pt declined patches/ointment at this time. She is going to give it some more time to see if it will clear up. She will try and increase gabapentin dose if tolerated and will call back if any further questions.

## 2018-12-20 ENCOUNTER — Other Ambulatory Visit: Payer: Self-pay | Admitting: Neurology

## 2018-12-20 MED ORDER — METHYLPHENIDATE HCL 20 MG PO TABS: ORAL_TABLET | ORAL | 0 refills | Status: DC

## 2018-12-20 NOTE — Telephone Encounter (Signed)
Pt is needing a refill on her methylphenidate (RITALIN) 20 MG tablet sent to [facility] on [address]

## 2018-12-21 ENCOUNTER — Other Ambulatory Visit: Payer: Self-pay | Admitting: Family Medicine

## 2018-12-29 ENCOUNTER — Telehealth: Payer: Self-pay | Admitting: Family Medicine

## 2018-12-29 NOTE — Telephone Encounter (Signed)
Called patient to schedule AWV. No Answer; will try to call patient back at later time. SF

## 2019-01-11 ENCOUNTER — Other Ambulatory Visit: Payer: Self-pay | Admitting: Family Medicine

## 2019-02-02 ENCOUNTER — Ambulatory Visit: Payer: Medicare HMO | Admitting: Neurology

## 2019-02-02 ENCOUNTER — Encounter: Payer: Self-pay | Admitting: Neurology

## 2019-02-02 ENCOUNTER — Other Ambulatory Visit: Payer: Self-pay

## 2019-02-02 VITALS — BP 101/63 | HR 100 | Temp 97.0°F | Ht 66.0 in | Wt 270.0 lb

## 2019-02-02 DIAGNOSIS — R260 Ataxic gait: Secondary | ICD-10-CM

## 2019-02-02 DIAGNOSIS — R269 Unspecified abnormalities of gait and mobility: Secondary | ICD-10-CM | POA: Diagnosis not present

## 2019-02-02 DIAGNOSIS — R5383 Other fatigue: Secondary | ICD-10-CM | POA: Diagnosis not present

## 2019-02-02 DIAGNOSIS — N3281 Overactive bladder: Secondary | ICD-10-CM

## 2019-02-02 DIAGNOSIS — G35 Multiple sclerosis: Secondary | ICD-10-CM | POA: Diagnosis not present

## 2019-02-02 DIAGNOSIS — B029 Zoster without complications: Secondary | ICD-10-CM | POA: Diagnosis not present

## 2019-02-02 DIAGNOSIS — R209 Unspecified disturbances of skin sensation: Secondary | ICD-10-CM | POA: Diagnosis not present

## 2019-02-02 DIAGNOSIS — R200 Anesthesia of skin: Secondary | ICD-10-CM

## 2019-02-02 MED ORDER — BUPROPION HCL ER (XL) 300 MG PO TB24: 300.0000 mg | ORAL_TABLET | Freq: Every day | ORAL | 11 refills | Status: DC

## 2019-02-02 MED ORDER — SERTRALINE HCL 100 MG PO TABS: 100.0000 mg | ORAL_TABLET | Freq: Every day | ORAL | 2 refills | Status: DC

## 2019-02-02 MED ORDER — METHYLPHENIDATE HCL 20 MG PO TABS: ORAL_TABLET | ORAL | 0 refills | Status: DC
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
## 2019-02-03 ENCOUNTER — Telehealth: Payer: Self-pay | Admitting: Family Medicine

## 2019-02-03 ENCOUNTER — Other Ambulatory Visit: Payer: Self-pay | Admitting: Neurology

## 2019-02-03 LAB — URINALYSIS, ROUTINE W REFLEX MICROSCOPIC
Bilirubin, UA: NEGATIVE
Glucose, UA: NEGATIVE
Ketones, UA: NEGATIVE
Leukocytes,UA: NEGATIVE
Nitrite, UA: NEGATIVE
Protein,UA: NEGATIVE
RBC, UA: NEGATIVE
Specific Gravity, UA: 1.013 (ref 1.005–1.030)
Urobilinogen, Ur: 0.2 mg/dL (ref 0.2–1.0)
pH, UA: 5.5 (ref 5.0–7.5)

## 2019-02-03 MED ORDER — SULFAMETHOXAZOLE-TRIMETHOPRIM 800-160 MG PO TABS: 1.0000 | ORAL_TABLET | Freq: Two times a day (BID) | ORAL | 0 refills | Status: DC

## 2019-02-03 NOTE — Telephone Encounter (Signed)
Called patient to schedule AWV, but no answer. SF

## 2019-02-04 LAB — URINE CULTURE

## 2019-02-07 ENCOUNTER — Telehealth: Payer: Self-pay | Admitting: *Deleted

## 2019-02-07 NOTE — Telephone Encounter (Signed)
-----   Message from Britt Bottom, MD sent at 02/03/2019  4:24 PM EDT ----- I called her and left a message that the urinalysis showed white blood cells consistent with an infection.  I will send in a prescription for Bactrim to the [facility] on [address].  If the culture and sensitivity either shows no infection or resistance we will make changes.

## 2019-02-07 NOTE — Telephone Encounter (Signed)
Called pt and verified she received Dr. Garth Bigness message and already picked up bactrim and started on it. She will call back with any further questions/concerns.

## 2019-02-28 ENCOUNTER — Encounter: Payer: Self-pay | Admitting: Gynecology

## 2019-03-01 ENCOUNTER — Telehealth: Payer: Self-pay

## 2019-03-01 NOTE — Telephone Encounter (Signed)
Copied from [place] [id number]. Topic: General - Other >> Mar 01, 2019  4:23 PM Celene Kras A wrote: Reason for CRM: Pt called and is experiencing right leg pain. Pt states the pain has caused her to fall many times. Pt states her last fall was on 02/19/2019. Pt states this was the first time she had hit her head.  PCP does not have anything available soon. Pt states she would not like to see another provider.  Pt is also requesting to have blood work done. Please advise.

## 2019-03-02 NOTE — Telephone Encounter (Signed)
So I could probably see her tomorrow afternoon via webex and If she could go to [facility] today and get her standard labs drawn and a ua and culture we could review that tomorrow. If she feels she hurt any bones she could also get xrays at [facility]

## 2019-03-02 NOTE — Telephone Encounter (Signed)
Please advise 

## 2019-03-03 NOTE — Telephone Encounter (Signed)
Called patient left voicemail for patient to call the office back

## 2019-04-02 ENCOUNTER — Other Ambulatory Visit: Payer: Self-pay | Admitting: Family Medicine

## 2019-04-04 ENCOUNTER — Other Ambulatory Visit: Payer: Self-pay | Admitting: Neurology

## 2019-04-04 ENCOUNTER — Other Ambulatory Visit: Payer: Self-pay | Admitting: Family Medicine

## 2019-04-10 ENCOUNTER — Other Ambulatory Visit: Payer: Self-pay | Admitting: Neurology

## 2019-04-10 MED ORDER — METHYLPHENIDATE HCL 20 MG PO TABS: ORAL_TABLET | ORAL | 0 refills | Status: DC

## 2019-04-10 NOTE — Telephone Encounter (Signed)
Pt has called for a refill on her methylphenidate (RITALIN) 20 MG tablet [place]

## 2019-04-10 NOTE — Addendum Note (Signed)
Addended by: Hope Pigeon on: 04/10/2019 03:04 PM   Modules accepted: Orders

## 2019-04-20 DIAGNOSIS — R69 Illness, unspecified: Secondary | ICD-10-CM | POA: Diagnosis not present

## 2019-05-05 DIAGNOSIS — H47292 Other optic atrophy, left eye: Secondary | ICD-10-CM | POA: Diagnosis not present

## 2019-05-05 DIAGNOSIS — G35 Multiple sclerosis: Secondary | ICD-10-CM | POA: Diagnosis not present

## 2019-05-05 DIAGNOSIS — H52221 Regular astigmatism, right eye: Secondary | ICD-10-CM | POA: Diagnosis not present

## 2019-05-05 DIAGNOSIS — H04123 Dry eye syndrome of bilateral lacrimal glands: Secondary | ICD-10-CM | POA: Diagnosis not present

## 2019-05-05 DIAGNOSIS — H40023 Open angle with borderline findings, high risk, bilateral: Secondary | ICD-10-CM | POA: Diagnosis not present

## 2019-05-05 DIAGNOSIS — H5203 Hypermetropia, bilateral: Secondary | ICD-10-CM | POA: Diagnosis not present

## 2019-05-05 DIAGNOSIS — H524 Presbyopia: Secondary | ICD-10-CM | POA: Diagnosis not present

## 2019-05-05 DIAGNOSIS — H2513 Age-related nuclear cataract, bilateral: Secondary | ICD-10-CM | POA: Diagnosis not present

## 2019-05-18 ENCOUNTER — Other Ambulatory Visit: Payer: Self-pay | Admitting: Family Medicine

## 2019-05-19 ENCOUNTER — Telehealth: Payer: Self-pay

## 2019-05-19 NOTE — Telephone Encounter (Signed)
Copied from [place] [id number]. Topic: Appointment Scheduling - Scheduling Inquiry for Clinic >> May 19, 2019 12:03 PM Mathis Bud wrote: Reason for CRM: Patient is requesting a CPE.  PCP does not have anything in the beginning of the year.  Patient would like to see if PCP can fit her in.  Call back [phone]

## 2019-05-23 NOTE — Telephone Encounter (Signed)
PCP has no CPE this year patient will have to be seen next year for a CPE or she can see someone else

## 2019-05-23 NOTE — Telephone Encounter (Signed)
LM for pt to call back to schedule feb-mar cpe with Dr. Charlett Blake or offered a CPE with Dr. Nani Ravens or Mackie Pai, PA sooner.

## 2019-05-29 ENCOUNTER — Other Ambulatory Visit: Payer: Self-pay | Admitting: Neurology

## 2019-05-29 MED ORDER — METHYLPHENIDATE HCL 20 MG PO TABS: ORAL_TABLET | ORAL | 0 refills | Status: DC

## 2019-05-29 NOTE — Telephone Encounter (Signed)
Pt is needing a refill on her methylphenidate (RITALIN) 20 MG tablet sent in to the [facility] on [address].

## 2019-06-27 ENCOUNTER — Other Ambulatory Visit: Payer: Self-pay | Admitting: Family Medicine

## 2019-07-24 ENCOUNTER — Other Ambulatory Visit: Payer: Self-pay | Admitting: Family Medicine

## 2019-07-24 DIAGNOSIS — Z1231 Encounter for screening mammogram for malignant neoplasm of breast: Secondary | ICD-10-CM

## 2019-07-25 ENCOUNTER — Other Ambulatory Visit: Payer: Self-pay

## 2019-07-25 ENCOUNTER — Ambulatory Visit (INDEPENDENT_AMBULATORY_CARE_PROVIDER_SITE_OTHER): Payer: Medicare HMO | Admitting: Family Medicine

## 2019-07-25 ENCOUNTER — Encounter: Payer: Self-pay | Admitting: Family Medicine

## 2019-07-25 VITALS — BP 120/72 | HR 94 | Temp 97.4°F | Ht 66.0 in | Wt 276.0 lb

## 2019-07-25 DIAGNOSIS — G6289 Other specified polyneuropathies: Secondary | ICD-10-CM | POA: Diagnosis not present

## 2019-07-25 DIAGNOSIS — R296 Repeated falls: Secondary | ICD-10-CM | POA: Diagnosis not present

## 2019-07-25 DIAGNOSIS — M8580 Other specified disorders of bone density and structure, unspecified site: Secondary | ICD-10-CM | POA: Diagnosis not present

## 2019-07-25 DIAGNOSIS — Z0001 Encounter for general adult medical examination with abnormal findings: Secondary | ICD-10-CM | POA: Diagnosis not present

## 2019-07-25 DIAGNOSIS — R739 Hyperglycemia, unspecified: Secondary | ICD-10-CM | POA: Diagnosis not present

## 2019-07-25 DIAGNOSIS — E669 Obesity, unspecified: Secondary | ICD-10-CM

## 2019-07-25 DIAGNOSIS — M25512 Pain in left shoulder: Secondary | ICD-10-CM

## 2019-07-25 DIAGNOSIS — Z0000 Encounter for general adult medical examination without abnormal findings: Secondary | ICD-10-CM

## 2019-07-25 DIAGNOSIS — E559 Vitamin D deficiency, unspecified: Secondary | ICD-10-CM

## 2019-07-25 DIAGNOSIS — R29898 Other symptoms and signs involving the musculoskeletal system: Secondary | ICD-10-CM

## 2019-07-25 DIAGNOSIS — Z780 Asymptomatic menopausal state: Secondary | ICD-10-CM

## 2019-07-25 DIAGNOSIS — I10 Essential (primary) hypertension: Secondary | ICD-10-CM | POA: Diagnosis not present

## 2019-07-25 LAB — COMPREHENSIVE METABOLIC PANEL
ALT: 15 U/L (ref 0–35)
AST: 19 U/L (ref 0–37)
Albumin: 3.9 g/dL (ref 3.5–5.2)
Alkaline Phosphatase: 109 U/L (ref 39–117)
BUN: 22 mg/dL (ref 6–23)
CO2: 33 mEq/L — ABNORMAL HIGH (ref 19–32)
Calcium: 10.1 mg/dL (ref 8.4–10.5)
Chloride: 101 mEq/L (ref 96–112)
Creatinine, Ser: 1.04 mg/dL (ref 0.40–1.20)
GFR: 64.44 mL/min (ref >60.00)
Glucose, Bld: 87 mg/dL (ref 70–99)
Potassium: 4.2 mEq/L (ref 3.5–5.1)
Sodium: 138 mEq/L (ref 135–145)
Total Bilirubin: 0.5 mg/dL (ref 0.2–1.2)
Total Protein: 7.3 g/dL (ref 6.0–8.3)

## 2019-07-25 LAB — LIPID PANEL
Cholesterol: 171 mg/dL (ref 0–200)
HDL: 50.7 mg/dL (ref >39.00)
LDL Cholesterol: 112 mg/dL — ABNORMAL HIGH (ref 0–99)
NonHDL: 120.36
Total CHOL/HDL Ratio: 3
Triglycerides: 42 mg/dL (ref 0.0–149.0)
VLDL: 8.4 mg/dL (ref 0.0–40.0)

## 2019-07-25 LAB — CBC
HCT: 36.1 % (ref 36.0–46.0)
Hemoglobin: 12.1 g/dL (ref 12.0–15.0)
MCHC: 33.5 g/dL (ref 30.0–36.0)
MCV: 90.8 fl (ref 78.0–100.0)
Platelets: 238 10*3/uL (ref 150.0–400.0)
RBC: 3.97 Mil/uL (ref 3.87–5.11)
RDW: 14.5 % (ref 11.5–15.5)
WBC: 6.3 10*3/uL (ref 4.0–10.5)

## 2019-07-25 LAB — VITAMIN D 25 HYDROXY (VIT D DEFICIENCY, FRACTURES): VITD: 57.93 ng/mL (ref 30.00–100.00)

## 2019-07-25 LAB — VITAMIN B12: Vitamin B-12: 513 pg/mL (ref 211–911)

## 2019-07-25 LAB — TSH: TSH: 0.86 u[IU]/mL (ref 0.35–4.50)

## 2019-07-25 NOTE — Assessment & Plan Note (Signed)
hgba1c acceptable, minimize simple carbs. Increase exercise as tolerated.  

## 2019-07-25 NOTE — Patient Instructions (Addendum)
[place] Blood Pressure cuff, upper arm, want BP 100-140/60-90 Pulse oximeter, want oxygen in 90s  Weekly vitals  Take Multivitamin with minerals, selenium Vitamin D 1000-2000 IU daily Probiotic with lactobacillus and bifidophilus Asprin EC 81 mg daily  Melatonin 2-5 mg at bedtime  [email or web address]   Preventive Care 76-65 Years Old, Female Preventive care refers to visits with your health care provider and lifestyle choices that can promote health and wellness. This includes:  A yearly physical exam. This may also be called an annual well check.  Regular dental visits and eye exams.  Immunizations.  Screening for certain conditions.  Healthy lifestyle choices, such as eating a healthy diet, getting regular exercise, not using drugs or products that contain nicotine and tobacco, and limiting alcohol use. What can I expect for my preventive care visit? Physical exam Your health care provider will check your:  Height and weight. This may be used to calculate body mass index (BMI), which tells if you are at a healthy weight.  Heart rate and blood pressure.  Skin for abnormal spots. Counseling Your health care provider may ask you questions about your:  Alcohol, tobacco, and drug use.  Emotional well-being.  Home and relationship well-being.  Sexual activity.  Eating habits.  Work and work Statistician.  Method of birth control.  Menstrual cycle.  Pregnancy history. What immunizations do I need?  Influenza (flu) vaccine  This is recommended every year. Tetanus, diphtheria, and pertussis (Tdap) vaccine  You may need a Td booster every 10 years. Varicella (chickenpox) vaccine  You may need this if you have not been vaccinated. Zoster (shingles) vaccine  You may need this after age 54. Measles, mumps, and rubella (MMR) vaccine  You may need at least one dose of MMR if you were born in 1957 or later. You may also need a  second dose. Pneumococcal conjugate (PCV13) vaccine  You may need this if you have certain conditions and were not previously vaccinated. Pneumococcal polysaccharide (PPSV23) vaccine  You may need one or two doses if you smoke cigarettes or if you have certain conditions. Meningococcal conjugate (MenACWY) vaccine  You may need this if you have certain conditions. Hepatitis A vaccine  You may need this if you have certain conditions or if you travel or work in places where you may be exposed to hepatitis A. Hepatitis B vaccine  You may need this if you have certain conditions or if you travel or work in places where you may be exposed to hepatitis B. Haemophilus influenzae type b (Hib) vaccine  You may need this if you have certain conditions. Human papillomavirus (HPV) vaccine  If recommended by your health care provider, you may need three doses over 6 months. You may receive vaccines as individual doses or as more than one vaccine together in one shot (combination vaccines). Talk with your health care provider about the risks and benefits of combination vaccines. What tests do I need? Blood tests  Lipid and cholesterol levels. These may be checked every 5 years, or more frequently if you are over 65 years old.  Hepatitis C test.  Hepatitis B test. Screening  Lung cancer screening. You may have this screening every year starting at age 655 if you have a 30-pack-year history of smoking and currently smoke or have quit within the past 15 years.  Colorectal cancer screening. All adults should have this screening starting at age 26 and continuing until age 6. Your health care provider may recommend screening at  age 65 if you are at increased risk. You will have tests every 1-10 years, depending on your results and the type of screening test.  Diabetes screening. This is done by checking your blood sugar (glucose) after you have not eaten for a while (fasting). You may have this done  every 1-3 years.  Mammogram. This may be done every 1-2 years. Talk with your health care provider about when you should start having regular mammograms. This may depend on whether you have a family history of breast cancer.  BRCA-related cancer screening. This may be done if you have a family history of breast, ovarian, tubal, or peritoneal cancers.  Pelvic exam and Pap test. This may be done every 3 years starting at age 655. Starting at age 65, this may be done every 5 years if you have a Pap test in combination with an HPV test. Other tests  Sexually transmitted disease (STD) testing.  Bone density scan. This is done to screen for osteoporosis. You may have this scan if you are at high risk for osteoporosis. Follow these instructions at home: Eating and drinking  Eat a diet that includes fresh fruits and vegetables, whole grains, lean protein, and low-fat dairy.  Take vitamin and mineral supplements as recommended by your health care provider.  Do not drink alcohol if: ? Your health care provider tells you not to drink. ? You are pregnant, may be pregnant, or are planning to become pregnant.  If you drink alcohol: ? Limit how much you have to 0-1 drink a day. ? Be aware of how much alcohol is in your drink. In the [place] one drink equals one 12 oz bottle of beer (355 mL), one 5 oz glass of wine (148 mL), or one 1 oz glass of hard liquor (44 mL). Lifestyle  Take daily care of your teeth and gums.  Stay active. Exercise for at least 30 minutes on 5 or more days each week.  Do not use any products that contain nicotine or tobacco, such as cigarettes, e-cigarettes, and chewing tobacco. If you need help quitting, ask your health care provider.  If you are sexually active, practice safe sex. Use a condom or other form of birth control (contraception) in order to prevent pregnancy and STIs (sexually transmitted infections).  If told by your health care provider, take low-dose aspirin  daily starting at age 655. What's next?  Visit your health care provider once a year for a well check visit.  Ask your health care provider how often you should have your eyes and teeth checked.  Stay up to date on all vaccines. This information is not intended to replace advice given to you by your health care provider. Make sure you discuss any questions you have with your health care provider. Document Revised: 01/27/2018 Document Reviewed: 01/27/2018 Elsevier Patient Education  [place] Blood Pressure cuff, upper arm, want BP 100-140/60-90 Pulse oximeter, want oxygen in 90s  Weekly vitals  Take Multivitamin with minerals, selenium Vitamin D 1000-2000 IU daily Probiotic with lactobacillus and bifidophilus Asprin EC 81 mg daily  Melatonin 2-5 mg at bedtime  Preventive Care 35-36 Years Old, Female Preventive care refers to visits with your health care provider and lifestyle choices that can promote health and wellness. This includes:  A yearly physical exam. This may also be called an annual well check.  Regular dental visits and eye exams.  Immunizations.  Screening for certain conditions.  Healthy lifestyle choices, such as eating  a healthy diet, getting regular exercise, not using drugs or products that contain nicotine and tobacco, and limiting alcohol use. What can I expect for my preventive care visit? Physical exam Your health care provider will check your:  Height and weight. This may be used to calculate body mass index (BMI), which tells if you are at a healthy weight.  Heart rate and blood pressure.  Skin for abnormal spots. Counseling Your health care provider may ask you questions about your:  Alcohol, tobacco, and drug use.  Emotional well-being.  Home and relationship well-being.  Sexual activity.  Eating habits.  Work and work Statistician.  Method of birth control.  Menstrual cycle.  Pregnancy history. What  immunizations do I need?  Influenza (flu) vaccine  This is recommended every year. Tetanus, diphtheria, and pertussis (Tdap) vaccine  You may need a Td booster every 10 years. Varicella (chickenpox) vaccine  You may need this if you have not been vaccinated. Zoster (shingles) vaccine  You may need this after age 27. Measles, mumps, and rubella (MMR) vaccine  You may need at least one dose of MMR if you were born in 1957 or later. You may also need a second dose. Pneumococcal conjugate (PCV13) vaccine  You may need this if you have certain conditions and were not previously vaccinated. Pneumococcal polysaccharide (PPSV23) vaccine  You may need one or two doses if you smoke cigarettes or if you have certain conditions. Meningococcal conjugate (MenACWY) vaccine  You may need this if you have certain conditions. Hepatitis A vaccine  You may need this if you have certain conditions or if you travel or work in places where you may be exposed to hepatitis A. Hepatitis B vaccine  You may need this if you have certain conditions or if you travel or work in places where you may be exposed to hepatitis B. Haemophilus influenzae type b (Hib) vaccine  You may need this if you have certain conditions. Human papillomavirus (HPV) vaccine  If recommended by your health care provider, you may need three doses over 6 months. You may receive vaccines as individual doses or as more than one vaccine together in one shot (combination vaccines). Talk with your health care provider about the risks and benefits of combination vaccines. What tests do I need? Blood tests  Lipid and cholesterol levels. These may be checked every 5 years, or more frequently if you are over 7 years old.  Hepatitis C test.  Hepatitis B test. Screening  Lung cancer screening. You may have this screening every year starting at age 30 if you have a 30-pack-year history of smoking and currently smoke or have quit  within the past 15 years.  Colorectal cancer screening. All adults should have this screening starting at age 97 and continuing until age 33. Your health care provider may recommend screening at age 62 if you are at increased risk. You will have tests every 1-10 years, depending on your results and the type of screening test.  Diabetes screening. This is done by checking your blood sugar (glucose) after you have not eaten for a while (fasting). You may have this done every 1-3 years.  Mammogram. This may be done every 1-2 years. Talk with your health care provider about when you should start having regular mammograms. This may depend on whether you have a family history of breast cancer.  BRCA-related cancer screening. This may be done if you have a family history of breast, ovarian, tubal, or peritoneal cancers.  Pelvic exam and Pap test. This may be done every 3 years starting at age 35. Starting at age 13, this may be done every 5 years if you have a Pap test in combination with an HPV test. Other tests  Sexually transmitted disease (STD) testing.  Bone density scan. This is done to screen for osteoporosis. You may have this scan if you are at high risk for osteoporosis. Follow these instructions at home: Eating and drinking  Eat a diet that includes fresh fruits and vegetables, whole grains, lean protein, and low-fat dairy.  Take vitamin and mineral supplements as recommended by your health care provider.  Do not drink alcohol if: ? Your health care provider tells you not to drink. ? You are pregnant, may be pregnant, or are planning to become pregnant.  If you drink alcohol: ? Limit how much you have to 0-1 drink a day. ? Be aware of how much alcohol is in your drink. In the [place] one drink equals one 12 oz bottle of beer (355 mL), one 5 oz glass of wine (148 mL), or one 1 oz glass of hard liquor (44 mL). Lifestyle  Take daily care of your teeth and gums.  Stay active.  Exercise for at least 30 minutes on 5 or more days each week.  Do not use any products that contain nicotine or tobacco, such as cigarettes, e-cigarettes, and chewing tobacco. If you need help quitting, ask your health care provider.  If you are sexually active, practice safe sex. Use a condom or other form of birth control (contraception) in order to prevent pregnancy and STIs (sexually transmitted infections).  If told by your health care provider, take low-dose aspirin daily starting at age 34. What's next?  Visit your health care provider once a year for a well check visit.  Ask your health care provider how often you should have your eyes and teeth checked.  Stay up to date on all vaccines. This information is not intended to replace advice given to you by your health care provider. Make sure you discuss any questions you have with your health care provider. Document Revised: 01/27/2018 Document Reviewed: 01/27/2018 Elsevier Patient Education  2020 [place].  [email or web address]

## 2019-07-26 ENCOUNTER — Other Ambulatory Visit: Payer: Self-pay | Admitting: Neurology

## 2019-07-26 ENCOUNTER — Ambulatory Visit
Admission: RE | Admit: 2019-07-26 | Discharge: 2019-07-26 | Disposition: A | Discharge status: Home / Self Care | Payer: Medicare HMO | Source: Ambulatory Visit | Attending: Family Medicine | Admitting: Family Medicine

## 2019-07-26 DIAGNOSIS — R29898 Other symptoms and signs involving the musculoskeletal system: Secondary | ICD-10-CM | POA: Insufficient documentation

## 2019-07-26 DIAGNOSIS — M25512 Pain in left shoulder: Secondary | ICD-10-CM | POA: Insufficient documentation

## 2019-07-26 DIAGNOSIS — Z1231 Encounter for screening mammogram for malignant neoplasm of breast: Secondary | ICD-10-CM | POA: Diagnosis not present

## 2019-07-26 LAB — RPR: RPR Ser Ql: NONREACTIVE

## 2019-07-26 MED ORDER — METHYLPHENIDATE HCL 20 MG PO TABS: ORAL_TABLET | ORAL | 0 refills | Status: DC

## 2019-07-26 NOTE — Assessment & Plan Note (Addendum)
Patient encouraged to maintain heart healthy diet, regular exercise, adequate sleep. Consider daily probiotics. Take medications as prescribed. Labs ordered and reviewed 

## 2019-07-26 NOTE — Assessment & Plan Note (Signed)
R>L and resulting in falls. Referred to physical therapy for further evaluation.

## 2019-07-26 NOTE — Progress Notes (Signed)
Subjective:    Patient ID: Sydney Pace, female    DOB: [birth year], 64 y.o.   MRN: [id number]  Chief Complaint  Patient presents with  . Annual Exam  . Leg Pain    right - has fallen twice this year    HPI Patient is in today for annual preventative exam and follow up on chronic medical concerns. She is noting ongoing trouble with left shoulder pain and bilateral lower extremity pain and weakness. She hs had a couple of falls due to right knee buckling. She has just noted some new skin lesions under and on her right breast. She is trying to maintain quarantine well. Denies any recent febrile illness or hospitalizations. Denies CP/palp/SOB/HA/congestion/fevers/GI or GU c/o. Taking meds as prescribed  Past Medical History:  Diagnosis Date  . Allergy   . Anemia   . Arthritis   . Arthritis 07/06/2017  . Constipation    occ  . Dehydration 06/09/2016  . Depression   . Esophageal reflux 08/08/2012  . GERD (gastroesophageal reflux disease)   . Hypertension   . Inverted nipple    right nipple has always been inverted - per pt  . MS (multiple sclerosis) ([place])   . Neuromuscular disorder ([place])    MS  . Other and unspecified hyperlipidemia 08/08/2012  . Thyroid disease     Past Surgical History:  Procedure Laterality Date  . COLONOSCOPY    . HYSTEROSCOPY  02, 04, 2008   with D&C  . ORIF WRIST FRACTURE Right 04/07/2018   Procedure: RIGHT WRIST OPEN REDUCTION INTERNAL FIXATION (ORIF);  Surgeon: Meredith Pel, MD;  Location: [facility];  Service: Orthopedics;  Laterality: Right;  . TMJ ARTHROSCOPY    . TONSILLECTOMY    . UPPER GASTROINTESTINAL ENDOSCOPY      Family History  Problem Relation Age of Onset  . Heart disease Mother        pacemaker  . Emphysema Mother   . Hypertension Mother   . Heart disease Father   . Diabetes Father   . Heart disease Sister        cad  . Sleep apnea Brother   . Colon cancer Neg Hx   . Esophageal cancer Neg Hx   . Rectal cancer Neg Hx   .  Stomach cancer Neg Hx   . Colon polyps Neg Hx     Social History   Socioeconomic History  . Marital status: Single    Spouse name: Not on file  . Number of children: Not on file  . Years of education: Not on file  . Highest education level: Not on file  Occupational History  . Not on file  Tobacco Use  . Smoking status: Never Smoker  . Smokeless tobacco: Never Used  Substance and Sexual Activity  . Alcohol use: Yes    Alcohol/week: 0.0 standard drinks    Comment: occassional  . Drug use: No  . Sexual activity: Never    Comment: 1st intercourse 13 yo-5 partners  Other Topics Concern  . Not on file  Social History Narrative  . Not on file   Social Determinants of Health   Financial Resource Strain:   . Difficulty of Paying Living Expenses: Not on file  Food Insecurity:   . Worried About Charity fundraiser in the Last Year: Not on file  . Ran Out of Food in the Last Year: Not on file  Transportation Needs:   . Lack of Transportation (Medical): Not on file  .  Lack of Transportation (Non-Medical): Not on file  Physical Activity:   . Days of Exercise per Week: Not on file  . Minutes of Exercise per Session: Not on file  Stress:   . Feeling of Stress : Not on file  Social Connections:   . Frequency of Communication with Friends and Family: Not on file  . Frequency of Social Gatherings with Friends and Family: Not on file  . Attends Religious Services: Not on file  . Active Member of Clubs or Organizations: Not on file  . Attends Archivist Meetings: Not on file  . Marital Status: Not on file  Intimate Partner Violence:   . Fear of Current or Ex-Partner: Not on file  . Emotionally Abused: Not on file  . Physically Abused: Not on file  . Sexually Abused: Not on file    Outpatient Medications Prior to Visit  Medication Sig Dispense Refill  . brimonidine (ALPHAGAN) 0.2 % ophthalmic solution Place 1 drop into both eyes 2 (two) times daily.    . Budesonide  (RHINOCORT ALLERGY NA) Place 1 spray into the nose daily.    Marland Kitchen buPROPion (WELLBUTRIN XL) 300 MG 24 hr tablet Take 1 tablet (300 mg total) by mouth daily. 30 tablet 11  . Carboxymethylcellul-Glycerin (LUBRICATING EYE DROPS OP) Place 1 drop into both eyes daily as needed (dry eyes).    . cetirizine (ZYRTEC) 10 MG tablet Take 10 mg by mouth every evening.     . Cholecalciferol (DIALYVITE VITAMIN D 5000 PO) Take 4,000 Units by mouth daily.     Marland Kitchen docusate sodium (COLACE) 100 MG capsule Take 100 mg by mouth 2 (two) times daily.    Marland Kitchen gabapentin (NEURONTIN) 100 MG capsule TAKE 1 CAPSULE BY MOUTH THREE TIMES DAILY 90 capsule 0  . lisinopril-hydrochlorothiazide (ZESTORETIC) 20-25 MG tablet Take 1 tablet by mouth once daily 90 tablet 0  . Multiple Vitamin (MULTIVITAMIN) tablet Take 1 tablet by mouth daily.    . naproxen sodium (ALEVE) 220 MG tablet Take 220 mg by mouth daily as needed (pain).    Marland Kitchen oxybutynin (DITROPAN) 5 MG tablet Take 1 tablet by mouth twice daily 180 tablet 3  . Probiotic Product (PROBIOTIC DAILY PO) Take by mouth.    . sertraline (ZOLOFT) 100 MG tablet Take 1 tablet (100 mg total) by mouth daily. 30 tablet 2  . methylphenidate (RITALIN) 20 MG tablet One po qAM and one po qNoon 60 tablet 0  . AZO-CRANBERRY PO Take by mouth.    . furosemide (LASIX) 20 MG tablet Take 1 tablet (20 mg total) by mouth daily as needed. (Patient taking differently: Take 20 mg by mouth daily as needed for edema. ) 90 tablet 1  . oxycodone (OXY-IR) 5 MG capsule Take 1 capsule (5 mg total) by mouth 2 (two) times daily as needed. (Patient not taking: Reported on 07/25/2019) 60 capsule 0  . gabapentin (NEURONTIN) 600 MG tablet Take 1 tablet (600 mg total) by mouth at bedtime. 30 tablet 2  . modafinil (PROVIGIL) 200 MG tablet One po in AM and at noon (Patient not taking: Reported on 07/25/2019) 60 tablet 5  . sulfamethoxazole-trimethoprim (BACTRIM DS) 800-160 MG tablet Take 1 tablet by mouth 2 (two) times daily. 14  tablet 0   No facility-administered medications prior to visit.    Allergies  Allergen Reactions  . Shellfish Allergy Hives, Shortness Of Breath, Itching, Swelling and Rash    Pt had previously carried an epi-pen and had a history of  severe reaction to shrimp with breathing problems and swelling of lips and tongue     Review of Systems  Constitutional: Positive for malaise/fatigue. Negative for chills and fever.  HENT: Negative for congestion and hearing loss.   Eyes: Negative for discharge.  Respiratory: Negative for cough, sputum production and shortness of breath.   Cardiovascular: Negative for chest pain, palpitations and leg swelling.  Gastrointestinal: Negative for abdominal pain, blood in stool, constipation, diarrhea, heartburn, nausea and vomiting.  Genitourinary: Negative for dysuria, frequency, hematuria and urgency.  Musculoskeletal: Positive for joint pain and myalgias. Negative for back pain and falls.  Skin: Positive for rash.       Skin lesion on right breast.   Neurological: Negative for dizziness, sensory change, loss of consciousness, weakness and headaches.  Endo/Heme/Allergies: Negative for environmental allergies. Does not bruise/bleed easily.  Psychiatric/Behavioral: Negative for depression and suicidal ideas. The patient is not nervous/anxious and does not have insomnia.        Objective:    Physical Exam Constitutional:      General: She is not in acute distress.    Appearance: She is not diaphoretic.  HENT:     Head: Normocephalic and atraumatic.     Right Ear: External ear normal.     Left Ear: External ear normal.     Nose: Nose normal.     Mouth/Throat:     Pharynx: No oropharyngeal exudate.  Eyes:     General: No scleral icterus.       Right eye: No discharge.        Left eye: No discharge.     Conjunctiva/sclera: Conjunctivae normal.     Pupils: Pupils are equal, round, and reactive to light.  Neck:     Thyroid: No thyromegaly.    Cardiovascular:     Rate and Rhythm: Normal rate and regular rhythm.     Heart sounds: Normal heart sounds. No murmur.  Pulmonary:     Effort: Pulmonary effort is normal. No respiratory distress.     Breath sounds: Normal breath sounds. No wheezing or rales.  Abdominal:     General: Bowel sounds are normal. There is no distension.     Palpations: Abdomen is soft. There is no mass.     Tenderness: There is no abdominal tenderness.  Musculoskeletal:        General: No tenderness. Normal range of motion.     Cervical back: Normal range of motion and neck supple.  Lymphadenopathy:     Cervical: No cervical adenopathy.  Skin:    General: Skin is warm and dry.     Findings: No rash.  Neurological:     Mental Status: She is alert and oriented to person, place, and time.     Cranial Nerves: No cranial nerve deficit.     Coordination: Coordination normal.     Deep Tendon Reflexes: Reflexes are normal and symmetric. Reflexes normal.     BP 120/72 (BP Location: Left Arm, Patient Position: Sitting, Cuff Size: Large)   Pulse 94   Temp (!) 97.4 F (36.3 C) (Temporal)   Ht 5\' 6"  (1.676 m)   Wt 276 lb (125.2 kg)   LMP  (LMP Unknown)   SpO2 96%   BMI 44.55 kg/m  Wt Readings from Last 3 Encounters:  07/25/19 276 lb (125.2 kg)  02/02/19 270 lb (122.5 kg)  08/02/18 256 lb 8 oz (116.3 kg)    Diabetic Foot Exam - Simple   Simple Foot Form Diabetic  Foot exam was performed with the following findings: Yes 07/25/2019  2:21 PM  Visual Inspection No deformities, no ulcerations, no other skin breakdown bilaterally: Yes Sensation Testing Intact to touch and monofilament testing bilaterally: Yes Pulse Check Posterior Tibialis and Dorsalis pulse intact bilaterally: Yes Comments    Lab Results  Component Value Date   WBC 7.9 04/07/2018   HGB 12.3 04/07/2018   HCT 38.2 04/07/2018   PLT 250 04/07/2018   GLUCOSE 91 04/07/2018   CHOL 161 01/10/2018   TRIG 67.0 01/10/2018   HDL 45.10  01/10/2018   LDLCALC 103 (H) 01/10/2018   ALT 11 01/10/2018   AST 16 01/10/2018   NA 137 04/07/2018   K 3.7 04/07/2018   CL 104 04/07/2018   CREATININE 1.08 (H) 04/07/2018   BUN 13 04/07/2018   CO2 22 04/07/2018   TSH 0.62 01/10/2018   HGBA1C 5.8 01/10/2018   MICROALBUR 1.17 01/02/2009    Lab Results  Component Value Date   TSH 0.62 01/10/2018   Lab Results  Component Value Date   WBC 7.9 04/07/2018   HGB 12.3 04/07/2018   HCT 38.2 04/07/2018   MCV 91.8 04/07/2018   PLT 250 04/07/2018   Lab Results  Component Value Date   NA 137 04/07/2018   K 3.7 04/07/2018   CO2 22 04/07/2018   GLUCOSE 91 04/07/2018   BUN 13 04/07/2018   CREATININE 1.08 (H) 04/07/2018   BILITOT 0.4 01/10/2018   ALKPHOS 87 01/10/2018   AST 16 01/10/2018   ALT 11 01/10/2018   PROT 7.7 01/10/2018   ALBUMIN 4.0 01/10/2018   CALCIUM 9.8 04/07/2018   ANIONGAP 11 04/07/2018   GFR 66.61 01/10/2018   Lab Results  Component Value Date   CHOL 161 01/10/2018   Lab Results  Component Value Date   HDL 45.10 01/10/2018   Lab Results  Component Value Date   LDLCALC 103 (H) 01/10/2018   Lab Results  Component Value Date   TRIG 67.0 01/10/2018   Lab Results  Component Value Date   CHOLHDL 4 01/10/2018   Lab Results  Component Value Date   HGBA1C 5.8 01/10/2018       Assessment & Plan:   Problem List Items Addressed This Visit    Essential hypertension - Primary   Relevant Orders   CBC   Comprehensive metabolic panel   Lipid panel   TSH   Preventative health care    Patient encouraged to maintain heart healthy diet, regular exercise, adequate sleep. Consider daily probiotics. Take medications as prescribed. Labs ordered and reviewed      Osteopenia    Encouraged to get adequate exercise, calcium and vitamin d intake. dexa scan ordered      Relevant Orders   DG Bone Density   Obesity    Encouraged DASH diet, decrease po intake and increase exercise as tolerated. Needs 7-8 hours  of sleep nightly. Avoid trans fats, eat small, frequent meals every 4-5 hours with lean proteins, complex carbs and healthy fats. Minimize simple carbs      Hyperglycemia    hgba1c acceptable, minimize simple carbs. Increase exercise as tolerated.       Relevant Orders   Lipid panel   Hemoglobin A1c   Weakness of both lower extremities    R>L and resulting in falls. Referred to physical therapy for further evaluation.       Relevant Orders   Ambulatory referral to Physical Therapy   Left shoulder pain  Encouraged moist heat and gentle stretching as tolerated. May try NSAIDs and prescription meds as directed and report if symptoms worsen or seek immediate care.        Other Visit Diagnoses    Other polyneuropathy       Relevant Orders   Ambulatory referral to Physical Therapy   Vitamin B12   RPR (Completed)   Recurrent falls       Relevant Orders   Ambulatory referral to Physical Therapy   Vitamin D deficiency       Relevant Orders   VITAMIN D 25 Hydroxy (Vit-D Deficiency, Fractures)   Post-menopausal       Relevant Orders   DG Bone Density      I have discontinued Kendrick Fries Salva's modafinil and sulfamethoxazole-trimethoprim. I am also having her maintain her multivitamin, cetirizine, furosemide, Cholecalciferol (DIALYVITE VITAMIN D 5000 PO), docusate sodium, naproxen sodium, Carboxymethylcellul-Glycerin (LUBRICATING EYE DROPS OP), brimonidine, Budesonide (RHINOCORT ALLERGY NA), Probiotic Product (PROBIOTIC DAILY PO), AZO-CRANBERRY PO, oxycodone, buPROPion, sertraline, lisinopril-hydrochlorothiazide, oxybutynin, and gabapentin.  No orders of the defined types were placed in this encounter.    Penni Homans, MD

## 2019-07-26 NOTE — Telephone Encounter (Signed)
Pt is needing a refill on her methylphenidate (RITALIN) 20 MG tablet sent in to the [facility] on [address]

## 2019-07-26 NOTE — Assessment & Plan Note (Signed)
Encouraged moist heat and gentle stretching as tolerated. May try NSAIDs and prescription meds as directed and report if symptoms worsen or seek immediate care 

## 2019-07-26 NOTE — Assessment & Plan Note (Signed)
Encouraged DASH diet, decrease po intake and increase exercise as tolerated. Needs 7-8 hours of sleep nightly. Avoid trans fats, eat small, frequent meals every 4-5 hours with lean proteins, complex carbs and healthy fats. Minimize simple carbs 

## 2019-07-26 NOTE — Assessment & Plan Note (Signed)
Encouraged to get adequate exercise, calcium and vitamin d intake. dexa scan ordered

## 2019-07-27 ENCOUNTER — Other Ambulatory Visit: Payer: Self-pay | Admitting: *Deleted

## 2019-07-27 LAB — HEMOGLOBIN A1C: Hgb A1c MFr Bld: 5.5 % (ref 4.6–6.5)

## 2019-07-27 MED ORDER — LISINOPRIL-HYDROCHLOROTHIAZIDE 20-25 MG PO TABS: 1.0000 | ORAL_TABLET | Freq: Every day | ORAL | 1 refills | Status: DC

## 2019-07-27 MED ORDER — GABAPENTIN 100 MG PO CAPS: 100.0000 mg | ORAL_CAPSULE | Freq: Three times a day (TID) | ORAL | 1 refills | Status: DC

## 2019-07-28 ENCOUNTER — Other Ambulatory Visit: Payer: Self-pay | Admitting: Family Medicine

## 2019-07-28 ENCOUNTER — Other Ambulatory Visit: Payer: Self-pay | Admitting: Neurology

## 2019-08-03 ENCOUNTER — Encounter: Payer: Self-pay | Admitting: Neurology

## 2019-08-03 ENCOUNTER — Ambulatory Visit: Payer: Medicare HMO | Admitting: Neurology

## 2019-08-03 ENCOUNTER — Other Ambulatory Visit: Payer: Self-pay

## 2019-08-03 VITALS — BP 129/72 | HR 109 | Temp 97.8°F | Ht 66.0 in | Wt 279.5 lb

## 2019-08-03 DIAGNOSIS — F329 Major depressive disorder, single episode, unspecified: Secondary | ICD-10-CM

## 2019-08-03 DIAGNOSIS — G35 Multiple sclerosis: Secondary | ICD-10-CM | POA: Diagnosis not present

## 2019-08-03 DIAGNOSIS — R200 Anesthesia of skin: Secondary | ICD-10-CM | POA: Diagnosis not present

## 2019-08-03 DIAGNOSIS — F988 Other specified behavioral and emotional disorders with onset usually occurring in childhood and adolescence: Secondary | ICD-10-CM | POA: Diagnosis not present

## 2019-08-03 DIAGNOSIS — R260 Ataxic gait: Secondary | ICD-10-CM

## 2019-08-03 DIAGNOSIS — G2581 Restless legs syndrome: Secondary | ICD-10-CM

## 2019-08-03 DIAGNOSIS — R5383 Other fatigue: Secondary | ICD-10-CM | POA: Diagnosis not present

## 2019-08-03 DIAGNOSIS — F32A Depression, unspecified: Secondary | ICD-10-CM
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
## 2019-08-04 ENCOUNTER — Other Ambulatory Visit: Payer: Self-pay

## 2019-08-04 ENCOUNTER — Ambulatory Visit (HOSPITAL_BASED_OUTPATIENT_CLINIC_OR_DEPARTMENT_OTHER)
Admission: RE | Admit: 2019-08-04 | Discharge: 2019-08-04 | Disposition: A | Discharge status: Home / Self Care | Payer: Medicare HMO | Source: Ambulatory Visit | Attending: Family Medicine | Admitting: Family Medicine

## 2019-08-04 DIAGNOSIS — M8580 Other specified disorders of bone density and structure, unspecified site: Secondary | ICD-10-CM | POA: Diagnosis not present

## 2019-08-04 DIAGNOSIS — Z780 Asymptomatic menopausal state: Secondary | ICD-10-CM | POA: Insufficient documentation

## 2019-08-09 ENCOUNTER — Encounter: Payer: Self-pay | Admitting: Physical Therapy

## 2019-08-09 ENCOUNTER — Other Ambulatory Visit: Payer: Self-pay

## 2019-08-09 ENCOUNTER — Ambulatory Visit: Payer: Medicare HMO | Attending: Family Medicine | Admitting: Physical Therapy

## 2019-08-09 DIAGNOSIS — R2681 Unsteadiness on feet: Secondary | ICD-10-CM | POA: Diagnosis not present

## 2019-08-09 DIAGNOSIS — M79661 Pain in right lower leg: Secondary | ICD-10-CM | POA: Diagnosis not present

## 2019-08-09 DIAGNOSIS — M79662 Pain in left lower leg: Secondary | ICD-10-CM | POA: Diagnosis not present

## 2019-08-09 NOTE — Therapy (Signed)
[facility] [address], [place], [place], [address] Phone: [phone]   Fax:  [phone]  Physical Therapy Evaluation  Patient Details  Name: Sydney Pace MRN: [id number] Date of Birth: 08-Jul-1955 Referring Provider (PT): Penni Homans, MD   Encounter Date: 08/09/2019  PT End of Session - 08/09/19 1115    Visit Number  1    Number of Visits  12    Date for PT Re-Evaluation  10/04/19    Authorization Type  [employer] MCR    Authorization Time Period  08/09/19 - 10/04/19    Authorization - Visit Number  1    Authorization - Number of Visits  12    Progress Note Due on Visit  10    PT Start Time  1115    PT Stop Time  1148    PT Time Calculation (min)  33 min    Activity Tolerance  Patient tolerated treatment well    Behavior During Therapy  [facility] for tasks assessed/performed       Past Medical History:  Diagnosis Date  . Allergy   . Anemia   . Arthritis   . Arthritis 07/06/2017  . Constipation    occ  . Dehydration 06/09/2016  . Depression   . Esophageal reflux 08/08/2012  . GERD (gastroesophageal reflux disease)   . Hypertension   . Inverted nipple    right nipple has always been inverted - per pt  . MS (multiple sclerosis) ([place])   . Neuromuscular disorder ([place])    MS  . Other and unspecified hyperlipidemia 08/08/2012  . Thyroid disease     Past Surgical History:  Procedure Laterality Date  . COLONOSCOPY    . HYSTEROSCOPY  02, 04, 2008   with D&C  . ORIF WRIST FRACTURE Right 04/07/2018   Procedure: RIGHT WRIST OPEN REDUCTION INTERNAL FIXATION (ORIF);  Surgeon: Meredith Pel, MD;  Location: [facility];  Service: Orthopedics;  Laterality: Right;  . TMJ ARTHROSCOPY    . TONSILLECTOMY    . UPPER GASTROINTESTINAL ENDOSCOPY      There were no vitals filed for this visit.   Subjective Assessment - 08/09/19 1116    Subjective  Patient reporting leg problems for two years and it is getting worse.  It is hard  for me to get up and down. Pain in proximal lower leg. Patient has had multiple falls starting in 2019.    Pertinent History  Rt wrist fracture Dec 2019, MS, HTN, frequent falls    Currently in Pain?  Yes    Pain Score  4    10/10 with sit to stand   Pain Location  Leg    Pain Orientation  Right;Left    Pain Descriptors / Indicators  Sharp    Pain Type  Chronic pain    Pain Onset  More than a month ago    Pain Frequency  Constant    Aggravating Factors   sit to stand    Pain Relieving Factors  rubbing alcohol    Effect of Pain on Daily Activities  fear of falling         [facility] PT Assessment - 08/09/19 0001      Assessment   Medical Diagnosis  G62.89 (ICD-10-CM) - Other polyneuropathy    Referring Provider (PT)  Penni Homans, MD    Onset Date/Surgical Date  05/01/18      Precautions   Precautions  Fall    Precaution Comments  HIGH  FALL RISK      Balance Screen   Has the patient fallen in the past 6 months  Yes    How many times?  4-5    Has the patient had a decrease in activity level because of a fear of falling?   Yes    Is the patient reluctant to leave their home because of a fear of falling?   Yes      [place] residence    [place] entrance      Prior Function   Level of [place]  Retired      Associate Professor   Overall Cognitive Status  Within Functional Limits for tasks assessed      Sensation   Additional Comments  decreased sensation in right lateral lower leg      Coordination   Gross Motor Movements are Fluid and Coordinated  Yes      Functional Tests   Functional tests  Sit to Stand      Sit to Stand   Comments  requires BUE, rocking and use of hip extension to stand up straight      ROM / Strength   AROM / PROM / Strength  Strength      Strength   Overall Strength Comments  BLE 5/5 in sitting except ADDuctors 4+/5; Patient able to complete 10  bridges      Palpation   Palpation comment  left patellar tendon, medial joint line and ant tib; left lateral joint line and ant tib      Transfers   Five time sit to stand comments   unable to complete after 3x due to pain      Ambulation/Gait   Ambulation/Gait  Yes    Ambulation/Gait Assistance  5: Supervision    Ambulation Distance (Feet)  40 Feet    Assistive device  Small based quad cane    Gait Pattern  Step-through pattern;Decreased stance time - right;Decreased stance time - left;Decreased stride length;Wide base of support    Ambulation Surface  Level    Gait Comments  Pt advised to use walker at all times      Standardized Balance Assessment   Standardized Balance Assessment  Timed Up and Go Test;Five Times Sit to Stand    Five times sit to stand comments   3x 37 sec had to stop due to pain; uses BUE      Timed Up and Go Test   Normal TUG (seconds)  44   with quad cane               Objective measurements completed on examination: See above findings.              PT Education - 08/09/19 1802    Education Details  Advised to use walker at all times right now; Reviewed basic fall prevention; HEP - bridging    Person(s) Educated  Patient    Methods  Explanation;Demonstration;Handout    Comprehension  Verbalized understanding;Returned demonstration       PT Short Term Goals - 08/09/19 1813      PT SHORT TERM GOAL #1   Title  Pt to report decreased pain in Bil legs by 25% or more    Time  4    Period  Weeks    Status  New    Target Date  09/06/19  PT SHORT TERM GOAL #2   Title  Improved TUG to 20 sec or less    Baseline  44 sec with SBQC    Time  4    Period  Weeks    Status  New      PT SHORT TERM GOAL #3   Title  Patient able to perform 5x sit to stand without stopping due to pain.    Time  4    Period  Weeks      PT SHORT TERM GOAL #4   Title  Patient able to perform supine to sit to supine transfers with sufficient strength  to be able to get in/out of bed.    Time  4    Period  Weeks    Status  New        PT Long Term Goals - 08/09/19 1815      PT LONG TERM GOAL #1   Title  Improved TUG score to 9 sec or less with least restrictive AD to decrease fall risk    Time  8    Period  Weeks    Status  New    Target Date  10/04/19      PT LONG TERM GOAL #2   Title  Pt able to complete 5x sit to stand in 12 sec with 2/10 pain or less to help decrease fall risk.    Time  8    Period  Weeks    Status  New      PT LONG TERM GOAL #3   Title  Patient to report no falls since initial eval.    Time  8    Period  Weeks    Status  New      PT LONG TERM GOAL #4   Title  Patient able to negotiate curbs and 2-3 steps safely to allow mobiilty in community    Time  8    Period  Weeks    Status  New             Plan - 08/09/19 1803    Clinical Impression Statement  Patient was 15 min late due to having trouble finding clinic. Ms. Hanas amb unsteadily into clinic slowly and using a SBQC. She reports multiple falls (4-5) in the past 6 months. Her bigger complaint is pain in bil lower legs just distal to knees. She is tender to palpation and has increased pain with sit to stand. Pain eases some as she walks. She did sit to stand x 3 in 37 sec (stopped due to pain) and her TUG score was 44 sec making her a significant fall risk. PT advised that she use her walker at all times. She has fairly good LE strength but cannot do steps and has difficulty with bed mobility. She will benefit from PT to decrease leg pain and increase balance to prevent further falls and injury.    Personal Factors and Comorbidities  Comorbidity 3+    Comorbidities  Rt wrist fracture Dec 2019, MS, HTN, frequent falls    Examination-Activity Limitations  Stand;Locomotion Level    Examination-Participation Restrictions  Community Activity    Stability/Clinical Decision Making  Evolving/Moderate complexity    Clinical Decision Making  Low     Rehab Potential  Good    PT Frequency  2x / week    PT Duration  8 weeks   12 visits total   PT Treatment/Interventions  ADLs/Self Care Home Management;Cryotherapy;Electrical Stimulation;Gait training;Stair  training;Therapeutic exercise;Balance training;Neuromuscular re-education;Manual techniques;Patient/family education;Dry needling;Taping    PT Next Visit Plan  assess gait with walker, address lower leg pain (STW/DN), try to do BERG    PT Home Exercise Plan  [email or web address]    Consulted and Agree with Plan of Care  Patient       Patient will benefit from skilled therapeutic intervention in order to improve the following deficits and impairments:  Abnormal gait, Pain, Decreased balance, Decreased strength  Visit Diagnosis: Unsteadiness on feet - Plan: PT plan of care cert/re-cert  Pain in left lower leg - Plan: PT plan of care cert/re-cert  Pain in right lower leg - Plan: PT plan of care cert/re-cert     Problem List Patient Active Problem List   Diagnosis Date Noted  . Restless leg syndrome 08/03/2019  . Attention deficit disorder (ADD) in adult 08/03/2019  . Weakness of both lower extremities 07/26/2019  . Left shoulder pain 07/26/2019  . Hyperglycemia 07/25/2019  . Shingles 10/09/2018  . Lumbar radiculopathy 08/02/2018  . Numbness 08/02/2018  . Post-menopausal bleeding 03/17/2018  . Dysuria 03/17/2018  . High serum parathyroid hormone (PTH) 01/10/2018  . Hypercalcemia 01/10/2018  . Chronic heel pain, left 07/06/2017  . Arthritis 07/06/2017  . Osteopenia 07/06/2017  . Hx of colonic polyp 07/06/2017  . Cervical cancer screening 07/06/2017  . Obesity 07/06/2017  . Dry eyes, bilateral 10/10/2015  . Low-tension glaucoma of both eyes, moderate stage 10/10/2015  . Nuclear sclerotic cataract of both eyes 10/10/2015  . Partial optic atrophy of left eye 10/10/2015  . Insomnia 08/16/2014  . Optic neuritis 08/16/2014  . Ataxic gait 08/16/2014  . Other fatigue 08/16/2014  .  Abdominal pain, chronic, epigastric 04/03/2014  . Colon cancer screening 04/03/2014  . Helicobacter pylori (H. pylori) infection 03/21/2014  . Preventative health care 08/27/2013  . Sinusitis 08/08/2012  . Hyperlipidemia, mixed 08/08/2012  . Allergic state 08/08/2012  . Esophageal reflux 08/08/2012  . Depression 01/23/2011  . Leg swelling 01/23/2011  . Multiple sclerosis ([place]) 09/23/2010  . Essential hypertension 09/23/2010  . Overactive bladder 09/23/2010  . Thyroid nodule 09/23/2010  . Pulmonary nodule 09/23/2010  . Fatigue 09/23/2010    Madelyn Flavors PT 08/09/2019, 6:26 PM  [facility] [address], [place], [place], [address] Phone: [phone]   Fax:  [phone]  Name: Sydney Pace MRN: [id number] Date of Birth: 14-Aug-1955

## 2019-08-09 NOTE — Patient Instructions (Signed)
Access Code: [email or web address] URL: [email or web address] Date: 08/09/2019 Prepared by: Madelyn Flavors  Exercises Supine Bridge - 10 reps - 1-2 sets - 2x daily - 7x weekly

## 2019-08-13 IMAGING — MG 2D DIGITAL SCREENING BILATERAL MAMMOGRAM WITH CAD AND ADJUNCT TO
8 of 12 series · 8 of 28 positions shown · non-contrast
Comparison: Previous exam(s).

CLINICAL DATA: Screening.

EXAM:
2D DIGITAL SCREENING BILATERAL MAMMOGRAM WITH CAD AND ADJUNCT TOMO

[L MLO]
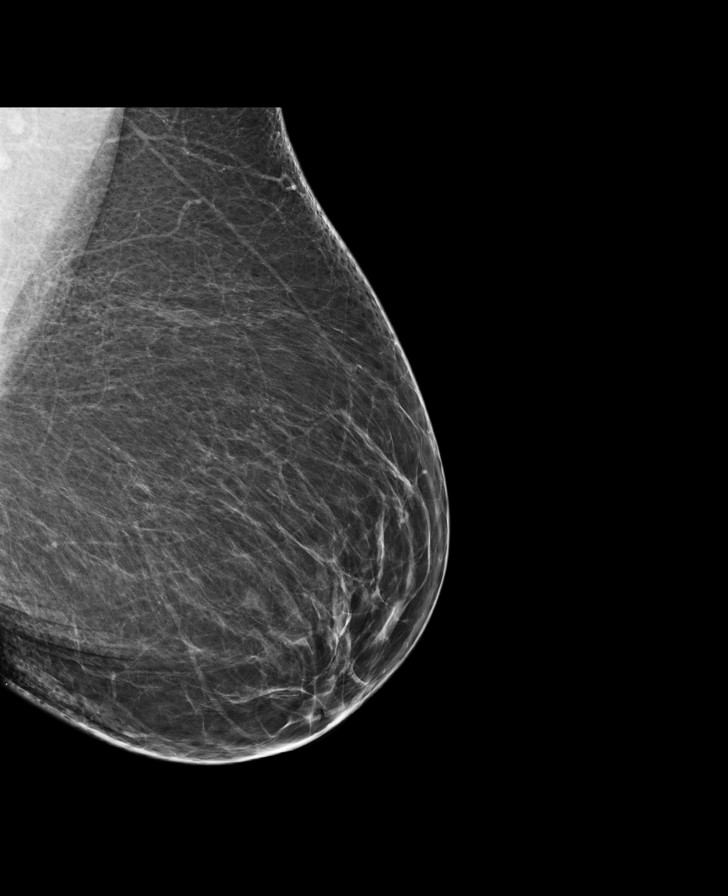

[R CC]
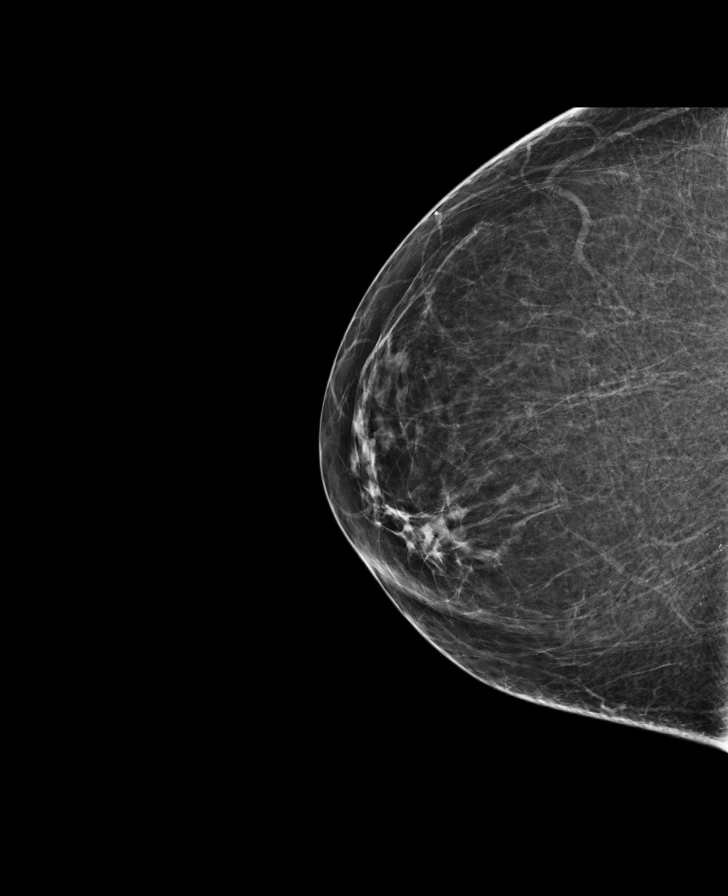

[R CC synth-2D]
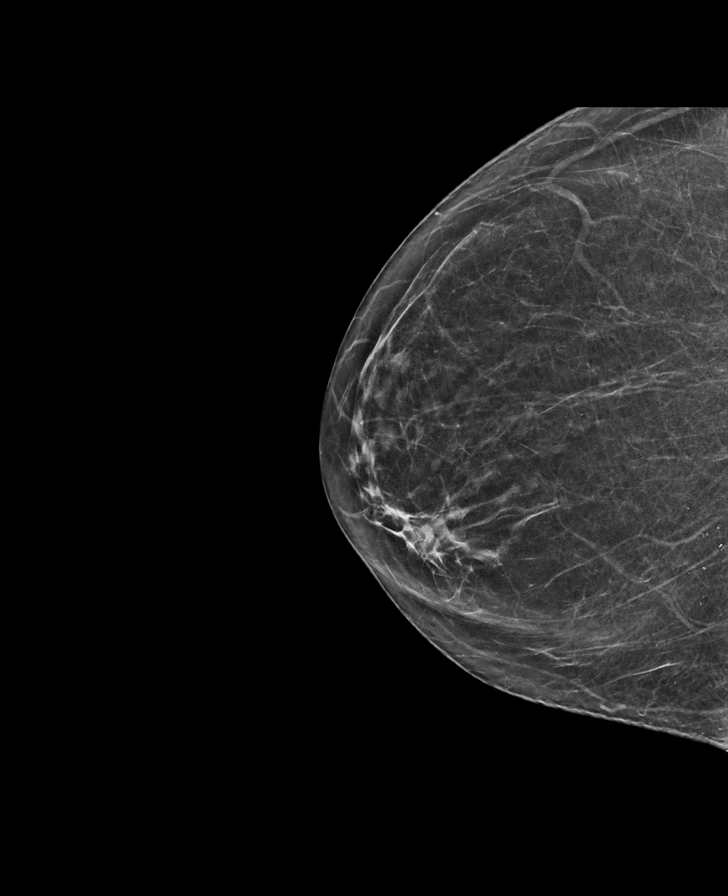

[L MLO synth-2D]
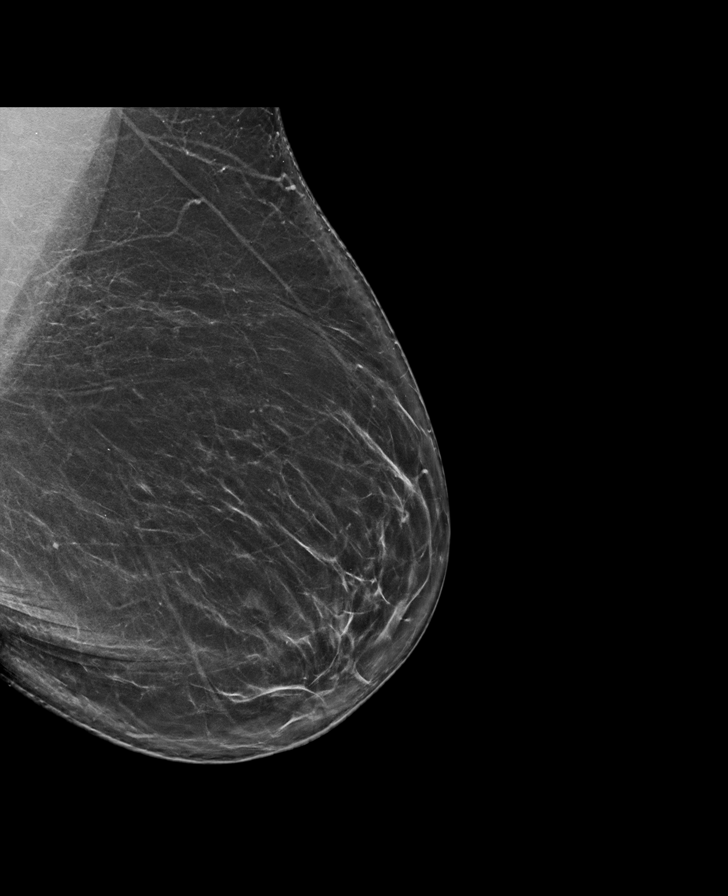

[R MLO]
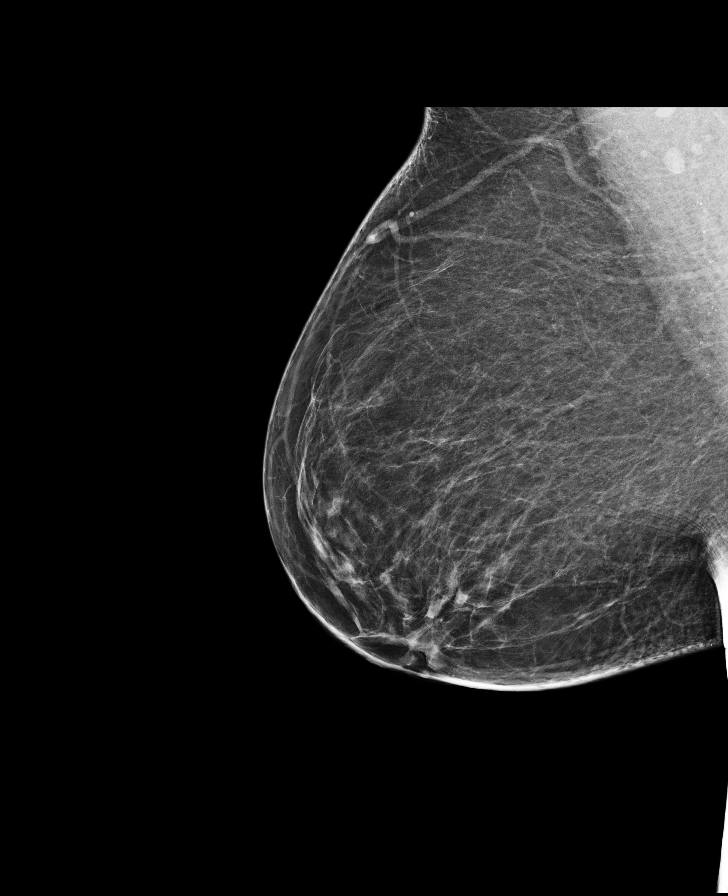

[R MLO synth-2D]
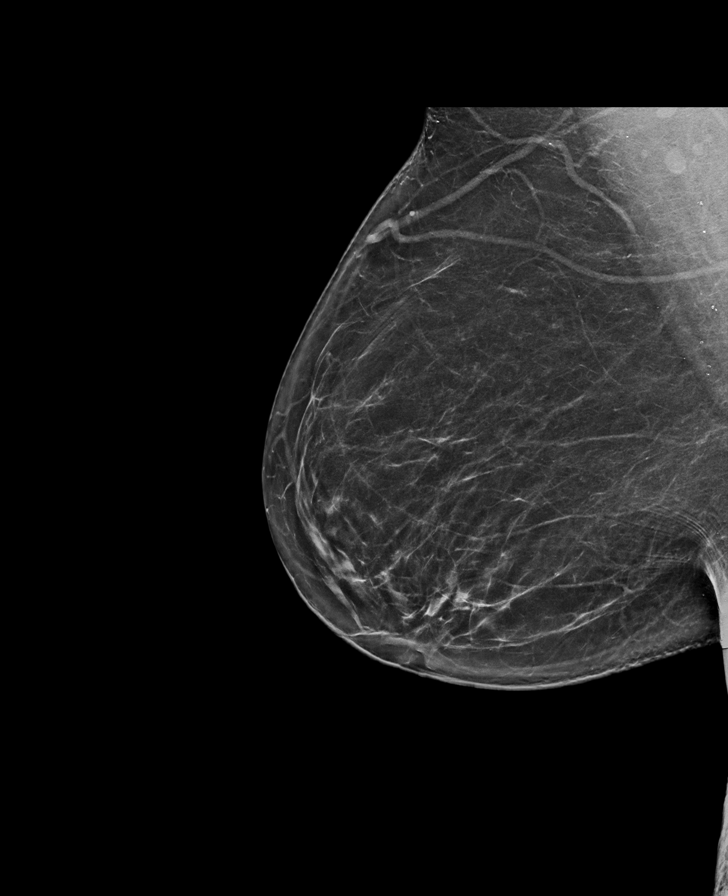

[L CC]
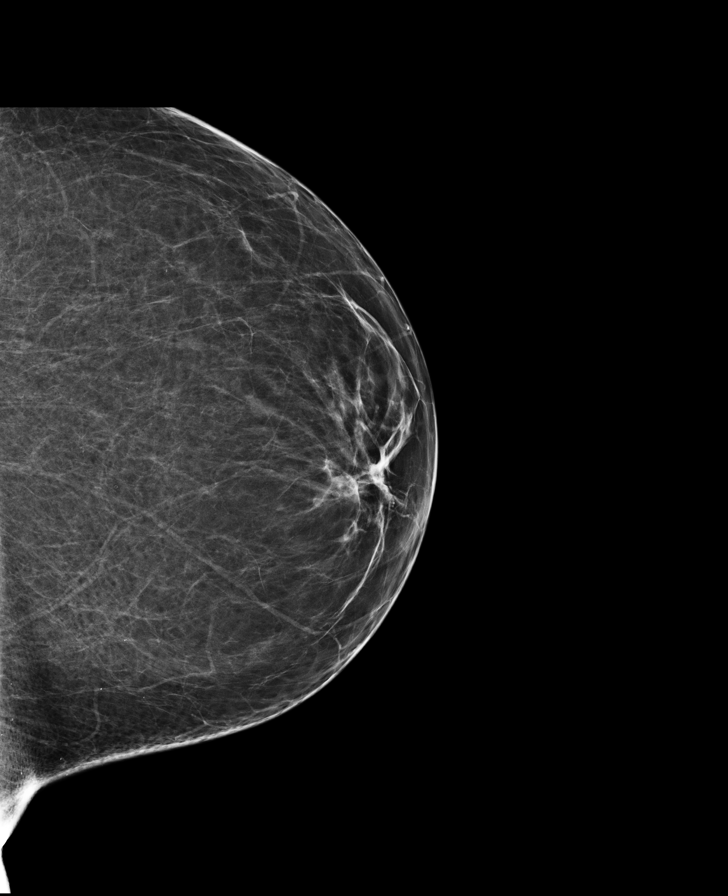

[L CC synth-2D]
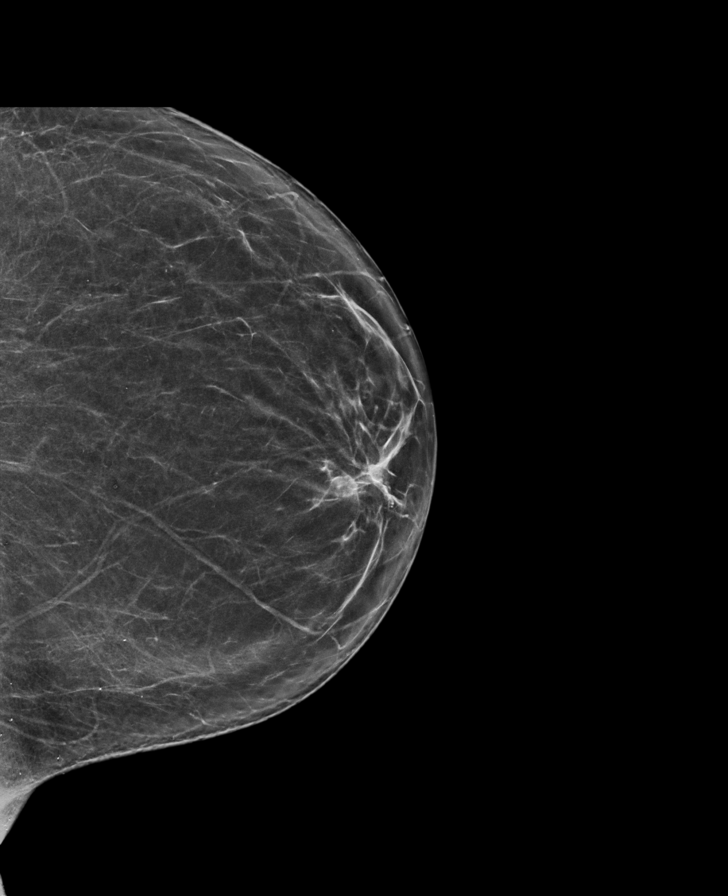

[8 of 28 positions shown; findings below may reference images not displayed]

ACR Breast Density Category b: There are scattered areas of
fibroglandular density.
FINDINGS: There are no findings suspicious for malignancy. Images were
processed with CAD.
IMPRESSION: No mammographic evidence of malignancy. A result letter of this
screening mammogram will be mailed directly to the patient.

RECOMMENDATION:
Screening mammogram in one year. (Code:97-6-RS4)

BI-RADS CATEGORY  1: Negative.

## 2019-08-17 ENCOUNTER — Ambulatory Visit: Payer: Medicare HMO | Admitting: Physical Therapy

## 2019-08-17 ENCOUNTER — Encounter: Payer: Self-pay | Admitting: Physical Therapy

## 2019-08-17 ENCOUNTER — Other Ambulatory Visit: Payer: Self-pay

## 2019-08-17 DIAGNOSIS — R2681 Unsteadiness on feet: Secondary | ICD-10-CM | POA: Diagnosis not present

## 2019-08-17 DIAGNOSIS — M79661 Pain in right lower leg: Secondary | ICD-10-CM | POA: Diagnosis not present

## 2019-08-17 DIAGNOSIS — M79662 Pain in left lower leg: Secondary | ICD-10-CM | POA: Diagnosis not present

## 2019-08-17 NOTE — Therapy (Addendum)
[facility] [address], [place], [place], [address] Phone: [phone]   Fax:  [phone]  Physical Therapy Treatment  Patient Details  Name: Sydney Pace MRN: [id number] Date of Birth: [birth year] Referring Provider (PT): Penni Homans, MD   Encounter Date: 08/17/2019  PT End of Session - 08/17/19 1109    Visit Number  2    Number of Visits  12    Date for PT Re-Evaluation  10/04/19    Authorization Type  [employer] MCR    Authorization Time Period  08/09/19 - 10/04/19    Authorization - Visit Number  2    Authorization - Number of Visits  12    Progress Note Due on Visit  10    PT Start Time  1109    PT Stop Time  1150    PT Time Calculation (min)  41 min    Activity Tolerance  Patient tolerated treatment well    Behavior During Therapy  [facility] for tasks assessed/performed       Past Medical History:  Diagnosis Date  . Allergy   . Anemia   . Arthritis   . Arthritis 07/06/2017  . Constipation    occ  . Dehydration 06/09/2016  . Depression   . Esophageal reflux 08/08/2012  . GERD (gastroesophageal reflux disease)   . Hypertension   . Inverted nipple    right nipple has always been inverted - per pt  . MS (multiple sclerosis) ([place])   . Neuromuscular disorder ([place])    MS  . Other and unspecified hyperlipidemia 08/08/2012  . Thyroid disease     Past Surgical History:  Procedure Laterality Date  . COLONOSCOPY    . HYSTEROSCOPY  02, 04, 2008   with D&C  . ORIF WRIST FRACTURE Right 04/07/2018   Procedure: RIGHT WRIST OPEN REDUCTION INTERNAL FIXATION (ORIF);  Surgeon: Meredith Pel, MD;  Location: [facility];  Service: Orthopedics;  Laterality: Right;  . TMJ ARTHROSCOPY    . TONSILLECTOMY    . UPPER GASTROINTESTINAL ENDOSCOPY      There were no vitals filed for this visit.  Subjective Assessment - 08/17/19 1109    Subjective  I've been using the walker at home.    Pertinent History  Rt wrist fracture Dec  2019, MS, HTN, frequent falls    Currently in Pain?  Yes    Pain Score  6     Pain Location  Leg    Pain Orientation  Right    Pain Descriptors / Indicators  Sharp    Pain Type  Chronic pain         [facility] PT Assessment - 08/17/19 0001      Strength   Overall Strength Comments  no pain with resisted Rt ankle eversion or plantar flexion      Ambulation/Gait   Gait Comments  Safe and normal gait pattern when using RW      Standardized Balance Assessment   Standardized Balance Assessment  Berg Balance Test      Berg Balance Test   Sit to Stand  Able to stand  independently using hands    Standing Unsupported  Able to stand safely 2 minutes    Sitting with Back Unsupported but Feet Supported on Floor or Stool  Able to sit safely and securely 2 minutes    Stand to Sit  Sits safely with minimal use of hands    Transfers  Able to transfer safely, minor  use of hands    Standing Unsupported with Eyes Closed  Able to stand 10 seconds safely    Standing Unsupported with Feet Together  Needs help to attain position but able to stand for 30 seconds with feet together    From Standing, Reach Forward with Outstretched Arm  Loses balance while trying/requires external support    From Standing Position, Pick up Object from Floor  Able to pick up shoe safely and easily    From Standing Position, Turn to Look Behind Over each Shoulder  Looks behind from both sides and weight shifts well    Turn 360 Degrees  Able to turn 360 degrees safely but slowly    Standing Unsupported, Alternately Place Feet on Step/Stool  Able to complete >2 steps/needs minimal assist    Standing Unsupported, One Foot in Front  Able to plae foot ahead of the other independently and hold 30 seconds    Standing on One Leg  Unable to try or needs assist to prevent fall    Total Score  38    Berg comment:  pt would not attempt reach or SLS                   [facility] Adult PT Treatment/Exercise - 08/17/19 0001       Manual Therapy   Manual Therapy  Soft tissue mobilization    Soft tissue mobilization  IASTM and manual to bil ant tib and peroneals and right lateral gastroc             PT Education - 08/17/19 1410    Education Details  self massage with dowel/rolling pin to bil lower legs    Person(s) Educated  Patient    Methods  Explanation;Demonstration    Comprehension  Verbalized understanding       PT Short Term Goals - 08/09/19 1813      PT SHORT TERM GOAL #1   Title  Pt to report decreased pain in Bil legs by 25% or more    Time  4    Period  Weeks    Status  New    Target Date  09/06/19      PT SHORT TERM GOAL #2   Title  Improved TUG to 20 sec or less    Baseline  44 sec with SBQC    Time  4    Period  Weeks    Status  New      PT SHORT TERM GOAL #3   Title  Patient able to perform 5x sit to stand without stopping due to pain.    Time  4    Period  Weeks      PT SHORT TERM GOAL #4   Title  Patient able to perform supine to sit to supine transfers with sufficient strength to be able to get in/out of bed.    Time  4    Period  Weeks    Status  New        PT Long Term Goals - 08/17/19 1416      PT LONG TERM GOAL #5   Title  Improved BERG score to >= 48/56 to decrease fall risk.    Baseline  38/56 on 08/17/19    Status  New            Plan - 08/17/19 1411    Clinical Impression Statement  Patient presenting with continued c/o of bil lower lateral leg pain Rt>Lt. She is  still using quad cane, but states she is using her walker at home. Gait assessed with walker and significantly improved. BERG Balance assessment was performed and she scored 38/56 indicating significant fall risk. Pt advised to use walker at all times. Patient tolerated IASTM very well to lower legs and reported decreased pain once she was standing. The sit to stand transition was still very painful. Tissue tension was markedly reduced. She would benefit from DN but is fearful of needles. Advised  daily self MFR.    Comorbidities  Rt wrist fracture Dec 2019, MS, HTN, frequent falls    PT Treatment/Interventions  ADLs/Self Care Home Management;Cryotherapy;Occupational psychologist;Therapeutic exercise;Balance training;Neuromuscular re-education;Manual techniques;Patient/family education;Dry needling;Taping    PT Next Visit Plan Patient called on 08/29/19 to request she be put on hold until end of POC due to financial reasons. She will try to come if she can.  Do IASTM first to lower legs/DN if pt willing; work on strengthening/balance; progress HEP    PT Home Exercise Plan  [email or web address]    Consulted and Agree with Plan of Care  Patient       Patient will benefit from skilled therapeutic intervention in order to improve the following deficits and impairments:  Abnormal gait, Pain, Decreased balance, Decreased strength  Visit Diagnosis: Unsteadiness on feet  Pain in left lower leg  Pain in right lower leg     Problem List Patient Active Problem List   Diagnosis Date Noted  . Restless leg syndrome 08/03/2019  . Attention deficit disorder (ADD) in adult 08/03/2019  . Weakness of both lower extremities 07/26/2019  . Left shoulder pain 07/26/2019  . Hyperglycemia 07/25/2019  . Shingles 10/09/2018  . Lumbar radiculopathy 08/02/2018  . Numbness 08/02/2018  . Post-menopausal bleeding 03/17/2018  . Dysuria 03/17/2018  . High serum parathyroid hormone (PTH) 01/10/2018  . Hypercalcemia 01/10/2018  . Chronic heel pain, left 07/06/2017  . Arthritis 07/06/2017  . Osteopenia 07/06/2017  . Hx of colonic polyp 07/06/2017  . Cervical cancer screening 07/06/2017  . Obesity 07/06/2017  . Dry eyes, bilateral 10/10/2015  . Low-tension glaucoma of both eyes, moderate stage 10/10/2015  . Nuclear sclerotic cataract of both eyes 10/10/2015  . Partial optic atrophy of left eye 10/10/2015  . Insomnia 08/16/2014  . Optic neuritis 08/16/2014  . Ataxic gait 08/16/2014  .  Other fatigue 08/16/2014  . Abdominal pain, chronic, epigastric 04/03/2014  . Colon cancer screening 04/03/2014  . Helicobacter pylori (H. pylori) infection 03/21/2014  . Preventative health care 08/27/2013  . Sinusitis 08/08/2012  . Hyperlipidemia, mixed 08/08/2012  . Allergic state 08/08/2012  . Esophageal reflux 08/08/2012  . Depression 01/23/2011  . Leg swelling 01/23/2011  . Multiple sclerosis ([place]) 09/23/2010  . Essential hypertension 09/23/2010  . Overactive bladder 09/23/2010  . Thyroid nodule 09/23/2010  . Pulmonary nodule 09/23/2010  . Fatigue 09/23/2010    Madelyn Flavors PT 08/17/2019, 2:21 PM PHYSICAL THERAPY DISCHARGE SUMMARY  Visits from Start of Care: 2  Current functional level related to goals / functional outcomes: Pt didn't return after her visit on 08/17/19.     Remaining deficits: See above for most current status.  Pt didn't return to PT.     Education / Equipment: HEP Plan: Patient agrees to discharge.  Patient goals were not met. Patient is being discharged due to not returning since the last visit.  ?????        Sigurd Sos, PT 11/06/19 9:39 AM  [facility] [address], [place], [place], [address] Phone: [phone]   Fax:  [phone]  Name: Mazzie Brodrick MRN: [id number] Date of Birth: 1955/07/11

## 2019-08-21 ENCOUNTER — Ambulatory Visit: Payer: Medicare HMO

## 2019-09-07 ENCOUNTER — Encounter: Payer: Medicare HMO | Admitting: Physical Therapy

## 2019-09-14 ENCOUNTER — Encounter: Payer: Medicare HMO | Admitting: Physical Therapy

## 2019-09-18 ENCOUNTER — Other Ambulatory Visit: Payer: Self-pay | Admitting: Family Medicine

## 2019-09-18 ENCOUNTER — Other Ambulatory Visit: Payer: Self-pay | Admitting: Neurology

## 2019-09-18 ENCOUNTER — Telehealth: Payer: Self-pay | Admitting: Family Medicine

## 2019-09-18 MED ORDER — SERTRALINE HCL 100 MG PO TABS: 100.0000 mg | ORAL_TABLET | Freq: Every day | ORAL | 3 refills | Status: DC

## 2019-09-18 MED ORDER — METHYLPHENIDATE HCL 20 MG PO TABS: ORAL_TABLET | ORAL | 0 refills | Status: DC

## 2019-09-18 MED ORDER — BUPROPION HCL ER (XL) 300 MG PO TB24: 300.0000 mg | ORAL_TABLET | Freq: Every day | ORAL | 11 refills | Status: DC

## 2019-09-18 NOTE — Telephone Encounter (Signed)
Advise on this refill was prescribed by other provider

## 2019-09-18 NOTE — Telephone Encounter (Signed)
Pt called needing a refill on her methylphenidate (RITALIN) 20 MG tablet and her buPROPion (WELLBUTRIN XL) 300 MG 24 hr tablet sent in to the [facility] on [address]

## 2019-09-18 NOTE — Telephone Encounter (Signed)
E-scribed rx wellbutrin as requested. Sent refill request for methylphenidate to MD to e-scribe

## 2019-09-18 NOTE — Telephone Encounter (Signed)
I have sent her in the sertraline please let her know

## 2019-09-18 NOTE — Telephone Encounter (Signed)
Medication: sertraline (ZOLOFT) 100 MG tablet [id number]   Has the patient contacted their pharmacy? No. (If no, request that the patient contact the pharmacy for the refill.) (If yes, when and what did the pharmacy advise?)  Preferred Pharmacy (with phone number or street name): [place], [place] - [id number]  [place] [address]  Phone:  [phone] Fax:  [phone]  DEA #:  --  Agent: Please be advised that RX refills may take up to 3 business days. We ask that you follow-up with your pharmacy.

## 2019-09-19 NOTE — Telephone Encounter (Signed)
Left message on machine that mediation has been sent in.

## 2019-09-21 ENCOUNTER — Encounter: Payer: Medicare HMO | Admitting: Physical Therapy

## 2019-10-13 ENCOUNTER — Other Ambulatory Visit: Payer: Self-pay

## 2019-10-13 ENCOUNTER — Telehealth (INDEPENDENT_AMBULATORY_CARE_PROVIDER_SITE_OTHER): Payer: PPO | Admitting: Family Medicine

## 2019-10-13 DIAGNOSIS — N3281 Overactive bladder: Secondary | ICD-10-CM

## 2019-10-13 DIAGNOSIS — R1013 Epigastric pain: Secondary | ICD-10-CM

## 2019-10-13 DIAGNOSIS — R739 Hyperglycemia, unspecified: Secondary | ICD-10-CM

## 2019-10-13 DIAGNOSIS — R260 Ataxic gait: Secondary | ICD-10-CM

## 2019-10-13 DIAGNOSIS — G8929 Other chronic pain: Secondary | ICD-10-CM | POA: Diagnosis not present

## 2019-10-13 DIAGNOSIS — T7840XD Allergy, unspecified, subsequent encounter: Secondary | ICD-10-CM | POA: Diagnosis not present

## 2019-10-13 DIAGNOSIS — R109 Unspecified abdominal pain: Secondary | ICD-10-CM

## 2019-10-13 DIAGNOSIS — I10 Essential (primary) hypertension: Secondary | ICD-10-CM | POA: Diagnosis not present

## 2019-10-13 DIAGNOSIS — K219 Gastro-esophageal reflux disease without esophagitis: Secondary | ICD-10-CM | POA: Diagnosis not present

## 2019-10-13 LAB — SEDIMENTATION RATE: Sed Rate: 31 mm/hr — ABNORMAL HIGH (ref 0–30)

## 2019-10-13 LAB — CBC WITH DIFFERENTIAL/PLATELET
Basophils Absolute: 0 10*3/uL (ref 0.0–0.1)
Basophils Relative: 0.4 % (ref 0.0–3.0)
Eosinophils Absolute: 0.2 10*3/uL (ref 0.0–0.7)
Eosinophils Relative: 2.3 % (ref 0.0–5.0)
HCT: 36.8 % (ref 36.0–46.0)
Hemoglobin: 12.4 g/dL (ref 12.0–15.0)
Lymphocytes Relative: 29.1 % (ref 12.0–46.0)
Lymphs Abs: 2 10*3/uL (ref 0.7–4.0)
MCHC: 33.7 g/dL (ref 30.0–36.0)
MCV: 90.6 fl (ref 78.0–100.0)
Monocytes Absolute: 0.4 10*3/uL (ref 0.1–1.0)
Monocytes Relative: 6.4 % (ref 3.0–12.0)
Neutro Abs: 4.2 10*3/uL (ref 1.4–7.7)
Neutrophils Relative %: 61.8 % (ref 43.0–77.0)
Platelets: 228 10*3/uL (ref 150.0–400.0)
RBC: 4.07 Mil/uL (ref 3.87–5.11)
RDW: 14.9 % (ref 11.5–15.5)
WBC: 6.8 10*3/uL (ref 4.0–10.5)

## 2019-10-13 LAB — TSH: TSH: 0.6 u[IU]/mL (ref 0.35–4.50)

## 2019-10-13 LAB — COMPREHENSIVE METABOLIC PANEL
ALT: 15 U/L (ref 0–35)
AST: 19 U/L (ref 0–37)
Albumin: 3.9 g/dL (ref 3.5–5.2)
Alkaline Phosphatase: 101 U/L (ref 39–117)
BUN: 21 mg/dL (ref 6–23)
CO2: 30 mEq/L (ref 19–32)
Calcium: 9.5 mg/dL (ref 8.4–10.5)
Chloride: 103 mEq/L (ref 96–112)
Creatinine, Ser: 0.98 mg/dL (ref 0.40–1.20)
GFR: 68.97 mL/min (ref >60.00)
Glucose, Bld: 103 mg/dL — ABNORMAL HIGH (ref 70–99)
Potassium: 4.1 mEq/L (ref 3.5–5.1)
Sodium: 138 mEq/L (ref 135–145)
Total Bilirubin: 0.4 mg/dL (ref 0.2–1.2)
Total Protein: 7.2 g/dL (ref 6.0–8.3)

## 2019-10-13 LAB — URINALYSIS
Bilirubin Urine: NEGATIVE
Ketones, ur: NEGATIVE
Leukocytes,Ua: NEGATIVE
Nitrite: NEGATIVE
Specific Gravity, Urine: 1.025 (ref 1.000–1.030)
Total Protein, Urine: NEGATIVE
Urine Glucose: NEGATIVE
Urobilinogen, UA: 0.2 (ref 0.0–1.0)
pH: 5.5 (ref 5.0–8.0)

## 2019-10-13 MED ORDER — FLUTICASONE PROPIONATE 50 MCG/ACT NA SUSP: 2.0000 | Freq: Every day | NASAL | 3 refills | Status: DC | PRN

## 2019-10-13 NOTE — Patient Instructions (Signed)
[employer] Blood Pressure cuff, upper arm, want BP 100-140/60-90 Pulse oximeter, want oxygen in 90s  Weekly vitals  Take Multivitamin with minerals, selenium Vitamin D 1000-2000 IU daily Probiotic with lactobacillus and bifidophilus Asprin EC 81 mg daily Melatonin 2-5 mg at bedtime Fish oil or krill oil  [email or web address]  Phone for covid shot [id number]

## 2019-10-14 LAB — URINE CULTURE
MICRO NUMBER:: 10479159
Result:: NO GROWTH
SPECIMEN QUALITY:: ADEQUATE

## 2019-10-15 NOTE — Assessment & Plan Note (Signed)
hgba1c acceptable, minimize simple carbs. Increase exercise as tolerated.  

## 2019-10-15 NOTE — Assessment & Plan Note (Signed)
Monitor and report concerns no changes to meds. Encouraged heart healthy diet such as the DASH diet and exercise as tolerated.  

## 2019-10-15 NOTE — Progress Notes (Signed)
Virtual Visit via Video Note  I connected with Sydney Pace on 10/13/19 at 10:00 AM EDT by a video enabled telemedicine application and verified that I am speaking with the correct person using two identifiers.  Location: Patient: home, patient was in the visit Provider: home, physician was in the visit    I discussed the limitations of evaluation and management by telemedicine and the availability of in person appointments. The patient expressed understanding and agreed to proceed. Kem Boroughs, CMA was able to get the patient set up on a video visit   Subjective:    Patient ID: Sydney Pace, female    DOB: 1956-02-20, 64 y.o.   MRN: [id number]  Chief Complaint  Patient presents with  . Abdominal Pain  . dark stools    HPI Patient is in today for follow up on chronic medical concerns and abdominal pain. No recent febrile illness or hospitalizations. She has noted not feeling well for the past 3 weeks although she is actually feeing better today. She notes her stool turned black and she has been experiencing abdominal pain. She continued to have roughly daily bowel movements but they were black until the last couple of days. She had a decrease in appetite and felt weak. She feels stronger now. Was never febrile. She has also noted some increased head congestion from allergies. She had to stop PT but did get in a few visits and found it helpful. Denies CP/palp/SOB/HA/fevers/GI or GU c/o. Taking meds as prescribed  Past Medical History:  Diagnosis Date  . Allergy   . Anemia   . Arthritis   . Arthritis 07/06/2017  . Constipation    occ  . Dehydration 06/09/2016  . Depression   . Esophageal reflux 08/08/2012  . GERD (gastroesophageal reflux disease)   . Hypertension   . Inverted nipple    right nipple has always been inverted - per pt  . MS (multiple sclerosis) ([place])   . Neuromuscular disorder ([place])    MS  . Other and unspecified hyperlipidemia 08/08/2012  . Thyroid disease       Past Surgical History:  Procedure Laterality Date  . COLONOSCOPY    . HYSTEROSCOPY  02, 04, 2008   with D&C  . ORIF WRIST FRACTURE Right 04/07/2018   Procedure: RIGHT WRIST OPEN REDUCTION INTERNAL FIXATION (ORIF);  Surgeon: Meredith Pel, MD;  Location: [facility];  Service: Orthopedics;  Laterality: Right;  . TMJ ARTHROSCOPY    . TONSILLECTOMY    . UPPER GASTROINTESTINAL ENDOSCOPY      Family History  Problem Relation Age of Onset  . Heart disease Mother        pacemaker  . Emphysema Mother   . Hypertension Mother   . Heart disease Father   . Diabetes Father   . Heart disease Sister        cad  . Sleep apnea Brother   . Colon cancer Neg Hx   . Esophageal cancer Neg Hx   . Rectal cancer Neg Hx   . Stomach cancer Neg Hx   . Colon polyps Neg Hx     Social History   Socioeconomic History  . Marital status: Single    Spouse name: Not on file  . Number of children: Not on file  . Years of education: Not on file  . Highest education level: Not on file  Occupational History  . Not on file  Tobacco Use  . Smoking status: Never Smoker  . Smokeless tobacco: Never  Used  Substance and Sexual Activity  . Alcohol use: Yes    Alcohol/week: 0.0 standard drinks    Comment: occassional  . Drug use: No  . Sexual activity: Never    Comment: 1st intercourse 13 yo-5 partners  Other Topics Concern  . Not on file  Social History Narrative  . Not on file   Social Determinants of Health   Financial Resource Strain:   . Difficulty of Paying Living Expenses:   Food Insecurity:   . Worried About Charity fundraiser in the Last Year:   . Arboriculturist in the Last Year:   Transportation Needs:   . Film/video editor (Medical):   Marland Kitchen Lack of Transportation (Non-Medical):   Physical Activity:   . Days of Exercise per Week:   . Minutes of Exercise per Session:   Stress:   . Feeling of Stress :   Social Connections:   . Frequency of Communication with Friends and Family:    . Frequency of Social Gatherings with Friends and Family:   . Attends Religious Services:   . Active Member of Clubs or Organizations:   . Attends Archivist Meetings:   Marland Kitchen Marital Status:   Intimate Partner Violence:   . Fear of Current or Ex-Partner:   . Emotionally Abused:   Marland Kitchen Physically Abused:   . Sexually Abused:     Outpatient Medications Prior to Visit  Medication Sig Dispense Refill  . AZO-CRANBERRY PO Take by mouth.    . brimonidine (ALPHAGAN) 0.2 % ophthalmic solution Place 1 drop into both eyes 2 (two) times daily.    Marland Kitchen buPROPion (WELLBUTRIN XL) 300 MG 24 hr tablet Take 1 tablet (300 mg total) by mouth daily. 30 tablet 11  . Carboxymethylcellul-Glycerin (LUBRICATING EYE DROPS OP) Place 1 drop into both eyes daily as needed (dry eyes).    . cetirizine (ZYRTEC) 10 MG tablet Take 10 mg by mouth every evening.     . Cholecalciferol (DIALYVITE VITAMIN D 5000 PO) Take 4,000 Units by mouth daily.     . furosemide (LASIX) 20 MG tablet Take 1 tablet (20 mg total) by mouth daily as needed. 90 tablet 1  . gabapentin (NEURONTIN) 100 MG capsule TAKE 1 CAPSULE BY MOUTH THREE TIMES DAILY 90 capsule 0  . lisinopril-hydrochlorothiazide (ZESTORETIC) 20-25 MG tablet Take 1 tablet by mouth daily. 90 tablet 1  . methylphenidate (RITALIN) 20 MG tablet One po qAM and one po qNoon 60 tablet 0  . Multiple Vitamin (MULTIVITAMIN) tablet Take 1 tablet by mouth daily.    . naproxen sodium (ALEVE) 220 MG tablet Take 220 mg by mouth daily as needed (pain).    Marland Kitchen oxybutynin (DITROPAN) 5 MG tablet Take 1 tablet by mouth twice daily 180 tablet 3  . Probiotic Product (PROBIOTIC DAILY PO) Take by mouth.    . sertraline (ZOLOFT) 100 MG tablet Take 1 tablet (100 mg total) by mouth daily. 30 tablet 3   No facility-administered medications prior to visit.    Allergies  Allergen Reactions  . Shellfish Allergy Hives, Shortness Of Breath, Itching, Swelling and Rash    Pt had previously carried an  epi-pen and had a history of severe reaction to shrimp with breathing problems and swelling of lips and tongue     Review of Systems  Constitutional: Negative for fever and malaise/fatigue.  HENT: Positive for congestion.   Eyes: Negative for blurred vision.  Respiratory: Negative for shortness of breath.   Cardiovascular:  Negative for chest pain, palpitations and leg swelling.  Gastrointestinal: Positive for abdominal pain, heartburn and melena. Negative for blood in stool, constipation, diarrhea and nausea.  Genitourinary: Negative for dysuria and frequency.  Musculoskeletal: Negative for falls.  Skin: Negative for rash.  Neurological: Negative for dizziness, loss of consciousness and headaches.  Endo/Heme/Allergies: Negative for environmental allergies.  Psychiatric/Behavioral: Negative for depression. The patient is not nervous/anxious.        Objective:    Physical Exam Constitutional:      Appearance: She is well-developed. She is not ill-appearing.  HENT:     Head: Normocephalic and atraumatic.  Pulmonary:     Effort: Pulmonary effort is normal.  Neurological:     Mental Status: She is alert and oriented to person, place, and time.  Psychiatric:        Behavior: Behavior normal.     LMP  (LMP Unknown)  Wt Readings from Last 3 Encounters:  08/03/19 279 lb 8 oz (126.8 kg)  07/25/19 276 lb (125.2 kg)  02/02/19 270 lb (122.5 kg)    Diabetic Foot Exam - Simple   No data filed     Lab Results  Component Value Date   WBC 6.8 10/13/2019   HGB 12.4 10/13/2019   HCT 36.8 10/13/2019   PLT 228.0 10/13/2019   GLUCOSE 103 (H) 10/13/2019   CHOL 171 07/25/2019   TRIG 42.0 07/25/2019   HDL 50.70 07/25/2019   LDLCALC 112 (H) 07/25/2019   ALT 15 10/13/2019   AST 19 10/13/2019   NA 138 10/13/2019   K 4.1 10/13/2019   CL 103 10/13/2019   CREATININE 0.98 10/13/2019   BUN 21 10/13/2019   CO2 30 10/13/2019   TSH 0.60 10/13/2019   HGBA1C 5.5 07/25/2019   MICROALBUR  1.17 01/02/2009    Lab Results  Component Value Date   TSH 0.60 10/13/2019   Lab Results  Component Value Date   WBC 6.8 10/13/2019   HGB 12.4 10/13/2019   HCT 36.8 10/13/2019   MCV 90.6 10/13/2019   PLT 228.0 10/13/2019   Lab Results  Component Value Date   NA 138 10/13/2019   K 4.1 10/13/2019   CO2 30 10/13/2019   GLUCOSE 103 (H) 10/13/2019   BUN 21 10/13/2019   CREATININE 0.98 10/13/2019   BILITOT 0.4 10/13/2019   ALKPHOS 101 10/13/2019   AST 19 10/13/2019   ALT 15 10/13/2019   PROT 7.2 10/13/2019   ALBUMIN 3.9 10/13/2019   CALCIUM 9.5 10/13/2019   ANIONGAP 11 04/07/2018   GFR 68.97 10/13/2019   Lab Results  Component Value Date   CHOL 171 07/25/2019   Lab Results  Component Value Date   HDL 50.70 07/25/2019   Lab Results  Component Value Date   LDLCALC 112 (H) 07/25/2019   Lab Results  Component Value Date   TRIG 42.0 07/25/2019   Lab Results  Component Value Date   CHOLHDL 3 07/25/2019   Lab Results  Component Value Date   HGBA1C 5.5 07/25/2019       Assessment & Plan:   Problem List Items Addressed This Visit    Essential hypertension - Primary    Monitor and report concerns. no changes to meds. Encouraged heart healthy diet such as the DASH diet and exercise as tolerated.       Relevant Orders   Comprehensive metabolic panel (Completed)   TSH (Completed)   Overactive bladder   Allergy    Recently flared. Use Cetirizine 10 mg  po bid, nasal saline bid and Flonase prn.       Esophageal reflux    Avoid offending foods, start probiotics. Do not eat large meals in late evening and consider raising head of bed.       Abdominal pain    Has been struggling with intermittent abdominal pain and dark stool for a couple of weeks although the last few days her pain is less and her stool are back to normal. Labs are unremarkable. She will report if symptoms worsen      Ataxic gait    She was only able to do a couple of PT sessions and for a  number of reasons she could not continue. She notes it helped so she would like to resume. She will need to go somewhere she can afford closer to home. She will call us back with the name of where she would like to do and we will place new referral      Hyperglycemia    hgba1c acceptable, minimize simple carbs. Increase exercise as tolerated.          I am having Sydney Pace start on fluticasone. I am also having her maintain her multivitamin, cetirizine, furosemide, Cholecalciferol (DIALYVITE VITAMIN D 5000 PO), naproxen sodium, Carboxymethylcellul-Glycerin (LUBRICATING EYE DROPS OP), brimonidine, Probiotic Product (PROBIOTIC DAILY PO), AZO-CRANBERRY PO, oxybutynin, lisinopril-hydrochlorothiazide, gabapentin, buPROPion, methylphenidate, and sertraline.  Meds ordered this encounter  Medications  . fluticasone (FLONASE) 50 MCG/ACT nasal spray    Sig: Place 2 sprays into both nostrils daily as needed for allergies or rhinitis.    Dispense:  16 g    Refill:  3     I discussed the assessment and treatment plan with the patient. The patient was provided an opportunity to ask questions and all were answered. The patient agreed with the plan and demonstrated an understanding of the instructions.   The patient was advised to call back or seek an in-person evaluation if the symptoms worsen or if the condition fails to improve as anticipated.  I provided 20 minutes of non-face-to-face time during this encounter.   Penni Homans, MD

## 2019-10-15 NOTE — Assessment & Plan Note (Signed)
Avoid offending foods, start probiotics. Do not eat large meals in late evening and consider raising head of bed.  

## 2019-10-15 NOTE — Assessment & Plan Note (Signed)
Recently flared. Use Cetirizine 10 mg po bid, nasal saline bid and Flonase prn.

## 2019-10-15 NOTE — Assessment & Plan Note (Signed)
She was only able to do a couple of PT sessions and for a number of reasons she could not continue. She notes it helped so she would like to resume. She will need to go somewhere she can afford closer to home. She will call us back with the name of where she would like to do and we will place new referral

## 2019-10-15 NOTE — Assessment & Plan Note (Signed)
Has been struggling with intermittent abdominal pain and dark stool for a couple of weeks although the last few days her pain is less and her stool are back to normal. Labs are unremarkable. She will report if symptoms worsen

## 2019-10-26 ENCOUNTER — Ambulatory Visit: Payer: Medicare HMO | Admitting: Family Medicine

## 2019-12-15 DIAGNOSIS — H401122 Primary open-angle glaucoma, left eye, moderate stage: Secondary | ICD-10-CM | POA: Diagnosis not present

## 2019-12-15 DIAGNOSIS — H2513 Age-related nuclear cataract, bilateral: Secondary | ICD-10-CM | POA: Diagnosis not present

## 2019-12-15 DIAGNOSIS — H47292 Other optic atrophy, left eye: Secondary | ICD-10-CM | POA: Diagnosis not present

## 2019-12-15 DIAGNOSIS — H40021 Open angle with borderline findings, high risk, right eye: Secondary | ICD-10-CM | POA: Diagnosis not present

## 2019-12-15 DIAGNOSIS — G35 Multiple sclerosis: Secondary | ICD-10-CM | POA: Diagnosis not present

## 2019-12-19 ENCOUNTER — Other Ambulatory Visit: Payer: Self-pay | Admitting: Family Medicine

## 2020-01-04 ENCOUNTER — Ambulatory Visit: Payer: PPO | Admitting: Family Medicine

## 2020-01-22 ENCOUNTER — Other Ambulatory Visit: Payer: Self-pay | Admitting: Neurology

## 2020-01-22 MED ORDER — METHYLPHENIDATE HCL 20 MG PO TABS: ORAL_TABLET | ORAL | 0 refills | Status: DC

## 2020-01-22 NOTE — Telephone Encounter (Signed)
Pt request refill methylphenidate (RITALIN) 20 MG tablet at [facility]

## 2020-01-22 NOTE — Addendum Note (Signed)
Addended by: Wyvonnia Lora on: 01/22/2020 04:39 PM   Modules accepted: Orders

## 2020-02-01 ENCOUNTER — Other Ambulatory Visit: Payer: Self-pay | Admitting: Family Medicine

## 2020-02-07 ENCOUNTER — Encounter: Payer: Self-pay | Admitting: Neurology

## 2020-02-07 ENCOUNTER — Ambulatory Visit: Payer: Medicare HMO | Admitting: Neurology

## 2020-02-07 ENCOUNTER — Telehealth: Payer: Self-pay | Admitting: Neurology

## 2020-02-07 VITALS — BP 126/74 | HR 78 | Ht 66.0 in | Wt 279.5 lb

## 2020-02-07 DIAGNOSIS — F329 Major depressive disorder, single episode, unspecified: Secondary | ICD-10-CM

## 2020-02-07 DIAGNOSIS — R260 Ataxic gait: Secondary | ICD-10-CM | POA: Diagnosis not present

## 2020-02-07 DIAGNOSIS — G2581 Restless legs syndrome: Secondary | ICD-10-CM

## 2020-02-07 DIAGNOSIS — F988 Other specified behavioral and emotional disorders with onset usually occurring in childhood and adolescence: Secondary | ICD-10-CM | POA: Diagnosis not present

## 2020-02-07 DIAGNOSIS — F32A Depression, unspecified: Secondary | ICD-10-CM

## 2020-02-07 DIAGNOSIS — N3281 Overactive bladder: Secondary | ICD-10-CM | POA: Diagnosis not present

## 2020-02-07 DIAGNOSIS — G35 Multiple sclerosis: Secondary | ICD-10-CM

## 2020-02-07 NOTE — Telephone Encounter (Signed)
Mcarthur Rossetti Josem Kaufmann: [phone] (exp. 02/07/20 to 03/08/20) order sent to [facility]. They will reach out to the patient to schedule.

## 2020-02-07 NOTE — Progress Notes (Signed)
u  [facility]  PATIENT: Sydney Pace DOB: Jun 18, 1955  REFERRING DOCTOR OR PCP:  Penni Homans  _________________________________   HISTORICAL  CHIEF COMPLAINT:  Chief Complaint  Patient presents with  . Follow-up    Room 12, alone. No falls in the last year. Ambulates with quad cane.     HISTORY OF PRESENT ILLNESS:  Sydney Pace is a 64 y.o. woman with multiple sclerosis.      Update 9/8//2021: She has not had any MS exacerbations and has been off DMTs x 6 years.   MRI between 2007 and 2017 were stable.      Gait is unchanged and off balanced.  She uses a cane.   She reports her legs are feeling worse.    Pain is dysesthetic, worse in right leg.   She has mild urinary hesitancy, not troubling, and no recent UTI's. Urgency helped by oxybutynin.    Vision is stable.     She had a bad panic attack last night -- first one in a long time.   After 10 minutes she was better..   She notes depression is about the same, it has generally been worse during  worse during Covid.   She is mostly isolated and does not have a car.  Her sister in law used to do more with her but now has a lot of health issues.   Sydney Pace is on [place] and Zoloft.  These have helped but incompletely.  She does not note anxiety or agitation.    She has reduced attention helped a lot by methylphenidate.    She tolerates it well.   She takes at least one every day and two some days.     She has insomnia.   She moves her legs at night which makes it hard to fall asleep.  Some nights this is minimal and others more of a problem.     She has done the vaccination for Covid-19.  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  MRI was performed in 1983 and was consistent with MS.     In the late 1990's, she was started on Copaxone but stopped after several weeks due to skin reactions. She also tried Avonex but had a rash and stopped. She started Gilenya  in January 2014 but stopped after 7 or 8 months due to being more fatigue and having flulike symptoms. In May 2015 we had discussed Aubagio. She decided not to start.  She had an MRI of the brain with and without contrast on 10/27/2013 and I reviewed the study. It showed white matter foci in a pattern consistent with MS. There was no enhancement. When compared to an MRI dated 11/08/2009, there was no interval change.   REVIEW OF SYSTEMS: Constitutional: No fevers, chills, sweats, or change in appetite.   Notes fatigue.   Insomnia Eyes: see above.  No double vision, eye pain..  Some eye redness Ear, nose and throat: No hearing loss, ear pain, nasal congestion, sore throat Cardiovascular: No chest pain, palpitations Respiratory: No shortness of breath at rest or with exertion.   No wheezes GastrointestinaI: No nausea, vomiting, diarrhea, abdominal pain, fecal incontinence Genitourinary: Mild urinary frequency.  No nocturia. Musculoskeletal: No neck pain, back pain.  Some hip pain Integumentary: No rash, pruritus, skin lesions Neurological: as above Psychiatric: Mild depression at this time, rarely cries, less anxiety Endocrine: No palpitations, diaphoresis, change in appetite,  change in weigh or increased thirst Hematologic/Lymphatic: No anemia, purpura, petechiae. Allergic/Immunologic: No itchy/runny eyes, nasal congestion, recent allergic reactions, rashes  ALLERGIES: Allergies  Allergen Reactions  . Shellfish Allergy Hives, Shortness Of Breath, Itching, Swelling and Rash    Pt had previously carried an epi-pen and had a history of severe reaction to shrimp with breathing problems and swelling of lips and tongue     HOME MEDICATIONS:  Current Outpatient Medications:  .  AZO-CRANBERRY PO, Take by mouth., Disp: , Rfl:  .  brimonidine (ALPHAGAN) 0.2 % ophthalmic solution, Place 1 drop into both eyes 2 (two) times daily., Disp: , Rfl:  .  buPROPion (WELLBUTRIN XL) 300 MG 24 hr tablet,  Take 1 tablet (300 mg total) by mouth daily., Disp: 30 tablet, Rfl: 11 .  Carboxymethylcellul-Glycerin (LUBRICATING EYE DROPS OP), Place 1 drop into both eyes daily as needed (dry eyes)., Disp: , Rfl:  .  cetirizine (ZYRTEC) 10 MG tablet, Take 10 mg by mouth every evening. , Disp: , Rfl:  .  Cholecalciferol (DIALYVITE VITAMIN D 5000 PO), Take 4,000 Units by mouth daily. , Disp: , Rfl:  .  fluticasone (FLONASE) 50 MCG/ACT nasal spray, Place 2 sprays into both nostrils daily as needed for allergies or rhinitis., Disp: 16 g, Rfl: 3 .  furosemide (LASIX) 20 MG tablet, Take 1 tablet (20 mg total) by mouth daily as needed., Disp: 90 tablet, Rfl: 1 .  gabapentin (NEURONTIN) 100 MG capsule, TAKE 1 CAPSULE BY MOUTH THREE TIMES DAILY, Disp: 90 capsule, Rfl: 0 .  lisinopril-hydrochlorothiazide (ZESTORETIC) 20-25 MG tablet, Take 1 tablet by mouth daily., Disp: 90 tablet, Rfl: 1 .  methylphenidate (RITALIN) 20 MG tablet, One po qAM and one po qNoon, Disp: 60 tablet, Rfl: 0 .  Multiple Vitamin (MULTIVITAMIN) tablet, Take 1 tablet by mouth daily., Disp: , Rfl:  .  naproxen sodium (ALEVE) 220 MG tablet, Take 220 mg by mouth daily as needed (pain)., Disp: , Rfl:  .  oxybutynin (DITROPAN) 5 MG tablet, Take 1 tablet by mouth twice daily, Disp: 180 tablet, Rfl: 3 .  Probiotic Product (PROBIOTIC DAILY PO), Take by mouth., Disp: , Rfl:  .  sertraline (ZOLOFT) 100 MG tablet, Take 1 tablet by mouth once daily, Disp: 30 tablet, Rfl: 0  PAST MEDICAL HISTORY: Past Medical History:  Diagnosis Date  . Allergy   . Anemia   . Arthritis   . Arthritis 07/06/2017  . Constipation    occ  . Dehydration 06/09/2016  . Depression   . Esophageal reflux 08/08/2012  . GERD (gastroesophageal reflux disease)   . Hypertension   . Inverted nipple    right nipple has always been inverted - per pt  . MS (multiple sclerosis) ([place])   . Neuromuscular disorder ([place])    MS  . Other and unspecified hyperlipidemia 08/08/2012  . Thyroid  disease     PAST SURGICAL HISTORY: Past Surgical History:  Procedure Laterality Date  . COLONOSCOPY    . HYSTEROSCOPY  02, 04, 2008   with D&C  . ORIF WRIST FRACTURE Right 04/07/2018   Procedure: RIGHT WRIST OPEN REDUCTION INTERNAL FIXATION (ORIF);  Surgeon: Meredith Pel, MD;  Location: [facility];  Service: Orthopedics;  Laterality: Right;  . TMJ ARTHROSCOPY    . TONSILLECTOMY    . UPPER GASTROINTESTINAL ENDOSCOPY      FAMILY HISTORY: Family History  Problem Relation Age of Onset  . Heart disease Mother        pacemaker  .  Emphysema Mother   . Hypertension Mother   . Heart disease Father   . Diabetes Father   . Heart disease Sister        cad  . Sleep apnea Brother   . Colon cancer Neg Hx   . Esophageal cancer Neg Hx   . Rectal cancer Neg Hx   . Stomach cancer Neg Hx   . Colon polyps Neg Hx     SOCIAL HISTORY:  Social History   Socioeconomic History  . Marital status: Single    Spouse name: Not on file  . Number of children: Not on file  . Years of education: Not on file  . Highest education level: Not on file  Occupational History  . Not on file  Tobacco Use  . Smoking status: Never Smoker  . Smokeless tobacco: Never Used  Vaping Use  . Vaping Use: Never used  Substance and Sexual Activity  . Alcohol use: Yes    Alcohol/week: 0.0 standard drinks    Comment: occassional  . Drug use: No  . Sexual activity: Never    Comment: 1st intercourse 13 yo-5 partners  Other Topics Concern  . Not on file  Social History Narrative  . Not on file   Social Determinants of Health   Financial Resource Strain:   . Difficulty of Paying Living Expenses: Not on file  Food Insecurity:   . Worried About Charity fundraiser in the Last Year: Not on file  . Ran Out of Food in the Last Year: Not on file  Transportation Needs:   . Lack of Transportation (Medical): Not on file  . Lack of Transportation (Non-Medical): Not on file  Physical Activity:   . Days of Exercise  per Week: Not on file  . Minutes of Exercise per Session: Not on file  Stress:   . Feeling of Stress : Not on file  Social Connections:   . Frequency of Communication with Friends and Family: Not on file  . Frequency of Social Gatherings with Friends and Family: Not on file  . Attends Religious Services: Not on file  . Active Member of Clubs or Organizations: Not on file  . Attends Archivist Meetings: Not on file  . Marital Status: Not on file  Intimate Partner Violence:   . Fear of Current or Ex-Partner: Not on file  . Emotionally Abused: Not on file  . Physically Abused: Not on file  . Sexually Abused: Not on file     PHYSICAL EXAM  Vitals:   02/07/20 1147  BP: 126/74  Pulse: 78  Weight: 279 lb 8 oz (126.8 kg)  Height: 5\' 6"  (1.676 m)    Body mass index is 45.11 kg/m.   General: The patient is well-developed and well-nourished and in no acute distress  Neurologic Exam  Mental status: The patient is alert and oriented x 3 at the time of the examination. The patient has apparent normal recent and remote memory, with an apparently normal attention span and concentration ability.   Speech is normal.  Cranial nerves: Extraocular movements are full.  She has reduced color vision on the left.  Facial strength was normal.   No dysarthria is noted.   Hearing seems normal.   Motor:  Muscle bulk is normal.  Strength is 5/5 except for 4/5 strength in the ulnar innervated hand muscles and 4+/5 in the EHL muscles.  Sensory: Symmetric sensation in proximal arms to touch, temp and vibration.  But reduced  left leg temperature sensation.   Coordination: Cerebellar testing reveals good finger-nose-finger bilaterally.  Gait and station: Station is normal but she needs to use her arms to stand up and has hip/leg pain.  Reduced stride and mildly wide gait.  She prefers to use a cane.     She cannot do a tandem walk.  Romberg is negative.  Reflexes: Deep tendon reflexes are  symmetric and normal in arms, 3+ at knees with mild spread and 2 at the ankles.Marland Kitchen        DIAGNOSTIC DATA (LABS, IMAGING, TESTING) - I reviewed patient records, labs, notes, testing and imaging myself where available.  Lab Results  Component Value Date   WBC 6.8 10/13/2019   HGB 12.4 10/13/2019   HCT 36.8 10/13/2019   MCV 90.6 10/13/2019   PLT 228.0 10/13/2019      Component Value Date/Time   NA 138 10/13/2019 1107   K 4.1 10/13/2019 1107   CL 103 10/13/2019 1107   CO2 30 10/13/2019 1107   GLUCOSE 103 (H) 10/13/2019 1107   BUN 21 10/13/2019 1107   CREATININE 0.98 10/13/2019 1107   CREATININE 0.91 04/22/2016 1730   CALCIUM 9.5 10/13/2019 1107   PROT 7.2 10/13/2019 1107   ALBUMIN 3.9 10/13/2019 1107   AST 19 10/13/2019 1107   ALT 15 10/13/2019 1107   ALKPHOS 101 10/13/2019 1107   BILITOT 0.4 10/13/2019 1107   GFRNONAA 53 (L) 04/07/2018 1330   GFRAA >60 04/07/2018 1330   Lab Results  Component Value Date   CHOL 171 07/25/2019   HDL 50.70 07/25/2019   LDLCALC 112 (H) 07/25/2019   TRIG 42.0 07/25/2019   CHOLHDL 3 07/25/2019   Lab Results  Component Value Date   HGBA1C 5.5 07/25/2019   Lab Results  Component Value Date   [id number] 725 07/25/2019   Lab Results  Component Value Date   TSH 0.60 10/13/2019       ASSESSMENT AND PLAN  Multiple sclerosis ([place]) - Plan: MR BRAIN WO CONTRAST  Ataxic gait  Restless leg syndrome  Overactive bladder  Attention deficit disorder (ADD) in adult  Depression, unspecified depression type   1.   She will stay off of an MS DMT.  Repeat MRi and go back on DMT if more than 1 or two new lesions over past 3 years.  2.    Continue Ritalin for attention, wakefulness and fatigue 3.   If RLS worsens increase night time gabapentin   Continue Zoloft and Wellbutrin for her mood.   4.    Continue Vit D supplements and stay active.  She will return to see [place] in 6 months or sooner if she has new or worsening neurologic  symptoms.   Nysha Koplin A. Felecia Shelling, MD, PhD 08/05/6438, 34:74 PM Certified in Neurology, Clinical Neurophysiology, Sleep Medicine, Pain Medicine and Neuroimaging  [facility] [address] [place], [place] [address] [phone]

## 2020-02-08 ENCOUNTER — Other Ambulatory Visit: Payer: Self-pay | Admitting: Family Medicine

## 2020-02-23 ENCOUNTER — Other Ambulatory Visit: Payer: Self-pay | Admitting: *Deleted

## 2020-02-23 MED ORDER — FLUTICASONE PROPIONATE 50 MCG/ACT NA SUSP: 2.0000 | Freq: Every day | NASAL | 1 refills | Status: DC | PRN

## 2020-02-23 MED ORDER — BUPROPION HCL ER (XL) 300 MG PO TB24: 300.0000 mg | ORAL_TABLET | Freq: Every day | ORAL | 1 refills | Status: DC

## 2020-02-29 ENCOUNTER — Other Ambulatory Visit: Payer: Self-pay

## 2020-03-15 ENCOUNTER — Other Ambulatory Visit: Payer: Self-pay | Admitting: Neurology

## 2020-03-15 MED ORDER — METHYLPHENIDATE HCL 20 MG PO TABS: ORAL_TABLET | ORAL | 0 refills | Status: DC

## 2020-03-15 NOTE — Telephone Encounter (Signed)
Pt request refill methylphenidate (RITALIN) 20 MG tablet at [id number]  

## 2020-03-17 ENCOUNTER — Other Ambulatory Visit: Payer: Self-pay | Admitting: Family Medicine

## 2020-03-20 ENCOUNTER — Other Ambulatory Visit: Payer: Self-pay | Admitting: Neurology

## 2020-03-20 MED ORDER — METHYLPHENIDATE HCL 20 MG PO TABS: ORAL_TABLET | ORAL | 0 refills | Status: DC

## 2020-03-20 NOTE — Telephone Encounter (Signed)
Pt request refill methylphenidate (RITALIN) 20 MG tablet at [facility]

## 2020-04-29 ENCOUNTER — Other Ambulatory Visit: Payer: Self-pay | Admitting: Family Medicine

## 2020-05-06 ENCOUNTER — Telehealth: Payer: Self-pay | Admitting: Family Medicine

## 2020-05-06 ENCOUNTER — Other Ambulatory Visit: Payer: Self-pay | Admitting: Neurology

## 2020-05-06 MED ORDER — METHYLPHENIDATE HCL 20 MG PO TABS: ORAL_TABLET | ORAL | 0 refills | Status: DC

## 2020-05-06 NOTE — Telephone Encounter (Signed)
Pt is requesting a refill for methylphenidate (RITALIN) 20 MG tablet.  Pharmacy:  [facility] [phone]

## 2020-05-06 NOTE — Telephone Encounter (Signed)
Error

## 2020-05-06 NOTE — Addendum Note (Signed)
Addended by: Roberts Gaudy L on: 05/06/2020 11:12 AM   Modules accepted: Orders

## 2020-05-23 ENCOUNTER — Other Ambulatory Visit: Payer: Self-pay

## 2020-05-23 ENCOUNTER — Ambulatory Visit (INDEPENDENT_AMBULATORY_CARE_PROVIDER_SITE_OTHER): Payer: Medicare HMO | Admitting: Family Medicine

## 2020-05-23 ENCOUNTER — Encounter: Payer: Self-pay | Admitting: Family Medicine

## 2020-05-23 ENCOUNTER — Other Ambulatory Visit: Payer: Self-pay | Admitting: Neurology

## 2020-05-23 VITALS — BP 124/86 | HR 84 | Temp 96.4°F | Resp 18 | Wt 283.6 lb

## 2020-05-23 DIAGNOSIS — M8580 Other specified disorders of bone density and structure, unspecified site: Secondary | ICD-10-CM | POA: Diagnosis not present

## 2020-05-23 DIAGNOSIS — E669 Obesity, unspecified: Secondary | ICD-10-CM | POA: Diagnosis not present

## 2020-05-23 DIAGNOSIS — R2681 Unsteadiness on feet: Secondary | ICD-10-CM | POA: Diagnosis not present

## 2020-05-23 DIAGNOSIS — E782 Mixed hyperlipidemia: Secondary | ICD-10-CM

## 2020-05-23 DIAGNOSIS — R260 Ataxic gait: Secondary | ICD-10-CM

## 2020-05-23 DIAGNOSIS — R351 Nocturia: Secondary | ICD-10-CM | POA: Diagnosis not present

## 2020-05-23 DIAGNOSIS — R739 Hyperglycemia, unspecified: Secondary | ICD-10-CM

## 2020-05-23 DIAGNOSIS — M791 Myalgia, unspecified site: Secondary | ICD-10-CM | POA: Diagnosis not present

## 2020-05-23 DIAGNOSIS — I10 Essential (primary) hypertension: Secondary | ICD-10-CM | POA: Diagnosis not present

## 2020-05-23 LAB — URINALYSIS
Bilirubin Urine: NEGATIVE
Hgb urine dipstick: NEGATIVE
Ketones, ur: NEGATIVE
Leukocytes,Ua: NEGATIVE
Nitrite: NEGATIVE
Specific Gravity, Urine: 1.015 (ref 1.000–1.030)
Total Protein, Urine: NEGATIVE
Urine Glucose: NEGATIVE
Urobilinogen, UA: 0.2 (ref 0.0–1.0)
pH: 7.5 (ref 5.0–8.0)

## 2020-05-23 LAB — LIPID PANEL
Cholesterol: 175 mg/dL (ref 0–200)
HDL: 57.9 mg/dL (ref >39.00)
LDL Cholesterol: 107 mg/dL — ABNORMAL HIGH (ref 0–99)
NonHDL: 117.24
Total CHOL/HDL Ratio: 3
Triglycerides: 50 mg/dL (ref 0.0–149.0)
VLDL: 10 mg/dL (ref 0.0–40.0)

## 2020-05-23 LAB — COMPREHENSIVE METABOLIC PANEL
ALT: 18 U/L (ref 0–35)
AST: 21 U/L (ref 0–37)
Albumin: 3.8 g/dL (ref 3.5–5.2)
Alkaline Phosphatase: 105 U/L (ref 39–117)
BUN: 17 mg/dL (ref 6–23)
CO2: 32 mEq/L (ref 19–32)
Calcium: 9.9 mg/dL (ref 8.4–10.5)
Chloride: 103 mEq/L (ref 96–112)
Creatinine, Ser: 1.07 mg/dL (ref 0.40–1.20)
GFR: 54.56 mL/min — ABNORMAL LOW (ref >60.00)
Glucose, Bld: 88 mg/dL (ref 70–99)
Potassium: 4.2 mEq/L (ref 3.5–5.1)
Sodium: 140 mEq/L (ref 135–145)
Total Bilirubin: 0.6 mg/dL (ref 0.2–1.2)
Total Protein: 7.3 g/dL (ref 6.0–8.3)

## 2020-05-23 LAB — HEMOGLOBIN A1C: Hgb A1c MFr Bld: 5.7 % (ref 4.6–6.5)

## 2020-05-23 LAB — CBC
HCT: 38.4 % (ref 36.0–46.0)
Hemoglobin: 12.7 g/dL (ref 12.0–15.0)
MCHC: 33 g/dL (ref 30.0–36.0)
MCV: 91.2 fl (ref 78.0–100.0)
Platelets: 225 10*3/uL (ref 150.0–400.0)
RBC: 4.21 Mil/uL (ref 3.87–5.11)
RDW: 15.2 % (ref 11.5–15.5)
WBC: 7.1 10*3/uL (ref 4.0–10.5)

## 2020-05-23 LAB — TSH: TSH: 0.6 u[IU]/mL (ref 0.35–4.50)

## 2020-05-23 LAB — SEDIMENTATION RATE: Sed Rate: 31 mm/hr — ABNORMAL HIGH (ref 0–30)

## 2020-05-23 MED ORDER — ALPRAZOLAM 0.25 MG PO TABS: 0.2500 mg | ORAL_TABLET | Freq: Two times a day (BID) | ORAL | 1 refills | Status: DC | PRN

## 2020-05-23 NOTE — Patient Instructions (Signed)
Covid booster in 2 weeks, can get at most pharmacies including the [place] Generalized Anxiety Disorder, Adult Generalized anxiety disorder (GAD) is a mental health disorder. People with this condition constantly worry about everyday events. Unlike normal anxiety, worry related to GAD is not triggered by a specific event. These worries also do not fade or get better with time. GAD interferes with life functions, including relationships, work, and school. GAD can vary from mild to severe. People with severe GAD can have intense waves of anxiety with physical symptoms (panic attacks). What are the causes? The exact cause of GAD is not known. What increases the risk? This condition is more likely to develop in:  Women.  People who have a family history of anxiety disorders.  People who are very shy.  People who experience very stressful life events, such as the death of a loved one.  People who have a very stressful family environment. What are the signs or symptoms? People with GAD often worry excessively about many things in their lives, such as their health and family. They may also be overly concerned about:  Doing well at work.  Being on time.  Natural disasters.  Friendships. Physical symptoms of GAD include:  Fatigue.  Muscle tension or having muscle twitches.  Trembling or feeling shaky.  Being easily startled.  Feeling like your heart is pounding or racing.  Feeling out of breath or like you cannot take a deep breath.  Having trouble falling asleep or staying asleep.  Sweating.  Nausea, diarrhea, or irritable bowel syndrome (IBS).  Headaches.  Trouble concentrating or remembering facts.  Restlessness.  Irritability. How is this diagnosed? Your health care provider can diagnose GAD based on your symptoms and medical history. You will also have a physical exam. The health care provider will ask specific questions about your symptoms,  including how severe they are, when they started, and if they come and go. Your health care provider may ask you about your use of alcohol or drugs, including prescription medicines. Your health care provider may refer you to a mental health specialist for further evaluation. Your health care provider will do a thorough examination and may perform additional tests to rule out other possible causes of your symptoms. To be diagnosed with GAD, a person must have anxiety that:  Is out of his or her control.  Affects several different aspects of his or her life, such as work and relationships.  Causes distress that makes him or her unable to take part in normal activities.  Includes at least three physical symptoms of GAD, such as restlessness, fatigue, trouble concentrating, irritability, muscle tension, or sleep problems. Before your health care provider can confirm a diagnosis of GAD, these symptoms must be present more days than they are not, and they must last for six months or longer. How is this treated? The following therapies are usually used to treat GAD:  Medicine. Antidepressant medicine is usually prescribed for long-term daily control. Antianxiety medicines may be added in severe cases, especially when panic attacks occur.  Talk therapy (psychotherapy). Certain types of talk therapy can be helpful in treating GAD by providing support, education, and guidance. Options include: ? Cognitive behavioral therapy (CBT). People learn coping skills and techniques to ease their anxiety. They learn to identify unrealistic or negative thoughts and behaviors and to replace them with positive ones. ? Acceptance and commitment therapy (ACT). This treatment teaches people how to be mindful as a way to cope  with unwanted thoughts and feelings. ? Biofeedback. This process trains you to manage your body's response (physiological response) through breathing techniques and relaxation methods. You will work  with a therapist while machines are used to monitor your physical symptoms.  Stress management techniques. These include yoga, meditation, and exercise. A mental health specialist can help determine which treatment is best for you. Some people see improvement with one type of therapy. However, other people require a combination of therapies. Follow these instructions at home:  Take over-the-counter and prescription medicines only as told by your health care provider.  Try to maintain a normal routine.  Try to anticipate stressful situations and allow extra time to manage them.  Practice any stress management or self-calming techniques as taught by your health care provider.  Do not punish yourself for setbacks or for not making progress.  Try to recognize your accomplishments, even if they are small.  Keep all follow-up visits as told by your health care provider. This is important. Contact a health care provider if:  Your symptoms do not get better.  Your symptoms get worse.  You have signs of depression, such as: ? A persistently sad, cranky, or irritable mood. ? Loss of enjoyment in activities that used to bring you joy. ? Change in weight or eating. ? Changes in sleeping habits. ? Avoiding friends or family members. ? Loss of energy for normal tasks. ? Feelings of guilt or worthlessness. Get help right away if:  You have serious thoughts about hurting yourself or others. If you ever feel like you may hurt yourself or others, or have thoughts about taking your own life, get help right away. You can go to your nearest emergency department or call:  Your local emergency services (911 in the [place]  A suicide crisis helpline, such as the [facility] at 332-427-9899. This is open 24 hours a day. Summary  Generalized anxiety disorder (GAD) is a mental health disorder that involves worry that is not triggered by a specific event.  People with GAD  often worry excessively about many things in their lives, such as their health and family.  GAD may cause physical symptoms such as restlessness, trouble concentrating, sleep problems, frequent sweating, nausea, diarrhea, headaches, and trembling or muscle twitching.  A mental health specialist can help determine which treatment is best for you. Some people see improvement with one type of therapy. However, other people require a combination of therapies. This information is not intended to replace advice given to you by your health care provider. Make sure you discuss any questions you have with your health care provider. Document Revised: 04/30/2017 Document Reviewed: 04/07/2016 Elsevier Patient Education  2020 [employer].

## 2020-05-24 LAB — URINE CULTURE
MICRO NUMBER:: 11353104
SPECIMEN QUALITY:: ADEQUATE

## 2020-05-28 NOTE — Assessment & Plan Note (Signed)
Encouraged heart healthy diet, increase exercise, avoid trans fats, consider a krill oil cap daily 

## 2020-05-28 NOTE — Assessment & Plan Note (Signed)
Referred for physical therapy to decrease her risk of falls or injury

## 2020-05-28 NOTE — Assessment & Plan Note (Signed)
hgba1c acceptable, minimize simple carbs. Increase exercise as tolerated.  

## 2020-05-28 NOTE — Assessment & Plan Note (Signed)
Well controlled, no changes to meds. Encouraged heart healthy diet such as the DASH diet and exercise as tolerated.  °

## 2020-05-28 NOTE — Assessment & Plan Note (Signed)
Encouraged to get adequate exercise, calcium and vitamin d intake 

## 2020-05-28 NOTE — Assessment & Plan Note (Signed)
Encouraged DASH or MIND diet, decrease po intake and increase exercise as tolerated. Needs 7-8 hours of sleep nightly. Avoid trans fats, eat small, frequent meals every 4-5 hours with lean proteins, complex carbs and healthy fats. Minimize simple carbs

## 2020-05-28 NOTE — Progress Notes (Signed)
Subjective:    Patient ID: Sydney Pace, female    DOB: 1955-09-15, 64 y.o.   MRN: [id number]  Chief Complaint  Patient presents with  . Follow-up    Pt states that her legs have been hurting bad.    HPI Patient is in today for follow up on chronic medical concerns. No recent febrile illness or hospitalizations. Her greatest concern is chronic pain and unsteady gait. She has pain in her back and diffuse muscle pain. Denies CP/palp/SOB/HA/congestion/fevers/GI or GU c/o. Taking meds as prescribed  Past Medical History:  Diagnosis Date  . Allergy   . Anemia   . Arthritis   . Arthritis 07/06/2017  . Constipation    occ  . Dehydration 06/09/2016  . Depression   . Esophageal reflux 08/08/2012  . GERD (gastroesophageal reflux disease)   . Hypertension   . Inverted nipple    right nipple has always been inverted - per pt  . MS (multiple sclerosis) (HCC)   . Neuromuscular disorder (HCC)    MS  . Other and unspecified hyperlipidemia 08/08/2012  . Thyroid disease     Past Surgical History:  Procedure Laterality Date  . COLONOSCOPY    . HYSTEROSCOPY  02, 04, 2008   with D&C  . ORIF WRIST FRACTURE Right 04/07/2018   Procedure: RIGHT WRIST OPEN REDUCTION INTERNAL FIXATION (ORIF);  Surgeon: Cammy Copa, MD;  Location: [facility] OR;  Service: Orthopedics;  Laterality: Right;  . TMJ ARTHROSCOPY    . TONSILLECTOMY    . UPPER GASTROINTESTINAL ENDOSCOPY      Family History  Problem Relation Age of Onset  . Heart disease Mother        pacemaker  . Emphysema Mother   . Hypertension Mother   . Heart disease Father   . Diabetes Father   . Heart disease Sister        cad  . Sleep apnea Brother   . Colon cancer Neg Hx   . Esophageal cancer Neg Hx   . Rectal cancer Neg Hx   . Stomach cancer Neg Hx   . Colon polyps Neg Hx     Social History   Socioeconomic History  . Marital status: Single    Spouse name: Not on file  . Number of children: Not on file  . Years of education:  Not on file  . Highest education level: Not on file  Occupational History  . Not on file  Tobacco Use  . Smoking status: Never Smoker  . Smokeless tobacco: Never Used  Vaping Use  . Vaping Use: Never used  Substance and Sexual Activity  . Alcohol use: Yes    Alcohol/week: 0.0 standard drinks    Comment: occassional  . Drug use: No  . Sexual activity: Never    Comment: 1st intercourse 13 yo-5 partners  Other Topics Concern  . Not on file  Social History Narrative  . Not on file   Social Determinants of Health   Financial Resource Strain: Not on file  Food Insecurity: Not on file  Transportation Needs: Not on file  Physical Activity: Not on file  Stress: Not on file  Social Connections: Not on file  Intimate Partner Violence: Not on file    Outpatient Medications Prior to Visit  Medication Sig Dispense Refill  . AZO-CRANBERRY PO Take by mouth.    . brimonidine (ALPHAGAN) 0.2 % ophthalmic solution Place 1 drop into both eyes 2 (two) times daily.    Marland Kitchen buPROPion [facility]  XL) 300 MG 24 hr tablet Take 1 tablet (300 mg total) by mouth daily. 90 tablet 1  . Carboxymethylcellul-Glycerin (LUBRICATING EYE DROPS OP) Place 1 drop into both eyes daily as needed (dry eyes).    . cetirizine (ZYRTEC) 10 MG tablet Take 10 mg by mouth every evening.     . Cholecalciferol (DIALYVITE VITAMIN D 5000 PO) Take 4,000 Units by mouth daily.     . fluticasone (FLONASE) 50 MCG/ACT nasal spray Place 2 sprays into both nostrils daily as needed for allergies or rhinitis. 48 g 1  . furosemide (LASIX) 20 MG tablet Take 1 tablet (20 mg total) by mouth daily as needed. 90 tablet 1  . gabapentin (NEURONTIN) 100 MG capsule TAKE 1 CAPSULE BY MOUTH THREE TIMES DAILY 90 capsule 0  . lisinopril-hydrochlorothiazide (ZESTORETIC) 20-25 MG tablet Take 1 tablet by mouth once daily 90 tablet 0  . methylphenidate (RITALIN) 20 MG tablet One po qAM and one po qNoon 60 tablet 0  . Multiple Vitamin (MULTIVITAMIN) tablet  Take 1 tablet by mouth daily.    . naproxen sodium (ALEVE) 220 MG tablet Take 220 mg by mouth daily as needed (pain).    . Probiotic Product (PROBIOTIC DAILY PO) Take by mouth.    . sertraline (ZOLOFT) 100 MG tablet Take 1 tablet by mouth once daily 90 tablet 0  . oxybutynin (DITROPAN) 5 MG tablet Take 1 tablet by mouth twice daily 180 tablet 3   No facility-administered medications prior to visit.    Allergies  Allergen Reactions  . Shellfish Allergy Hives, Shortness Of Breath, Itching, Swelling and Rash    Pt had previously carried an epi-pen and had a history of severe reaction to shrimp with breathing problems and swelling of lips and tongue     Review of Systems  Constitutional: Negative for fever and malaise/fatigue.  HENT: Negative for congestion.   Eyes: Negative for blurred vision.  Respiratory: Negative for shortness of breath.   Cardiovascular: Negative for chest pain, palpitations and leg swelling.  Gastrointestinal: Negative for abdominal pain, blood in stool and nausea.  Genitourinary: Negative for dysuria and frequency.  Musculoskeletal: Negative for falls.  Skin: Negative for rash.  Neurological: Negative for dizziness, loss of consciousness and headaches.  Endo/Heme/Allergies: Negative for environmental allergies.  Psychiatric/Behavioral: Negative for depression. The patient is not nervous/anxious.        Objective:    Physical Exam Vitals and nursing note reviewed.  Constitutional:      General: She is not in acute distress.    Appearance: She is well-developed and well-nourished. She is obese. She is not ill-appearing.  HENT:     Head: Normocephalic and atraumatic.     Nose: Nose normal.  Eyes:     General:        Right eye: No discharge.        Left eye: No discharge.  Cardiovascular:     Rate and Rhythm: Normal rate and regular rhythm.     Heart sounds: No murmur heard.   Pulmonary:     Effort: Pulmonary effort is normal.     Breath sounds:  Normal breath sounds.  Abdominal:     General: Bowel sounds are normal.     Palpations: Abdomen is soft.     Tenderness: There is no abdominal tenderness.  Musculoskeletal:        General: No edema.     Cervical back: Normal range of motion and neck supple.  Skin:    General:  Skin is warm and dry.  Neurological:     Mental Status: She is alert and oriented to person, place, and time.  Psychiatric:        Mood and Affect: Mood and affect normal.     BP 124/86   Pulse 84   Temp (!) 96.4 F (35.8 C) (Temporal)   Resp 18   Wt 283 lb 9.6 oz (128.6 kg)   LMP  (LMP Unknown)   SpO2 96%   BMI 45.77 kg/m  Wt Readings from Last 3 Encounters:  05/23/20 283 lb 9.6 oz (128.6 kg)  02/07/20 279 lb 8 oz (126.8 kg)  08/03/19 279 lb 8 oz (126.8 kg)    Diabetic Foot Exam - Simple   No data filed    Lab Results  Component Value Date   WBC 7.1 05/23/2020   HGB 12.7 05/23/2020   HCT 38.4 05/23/2020   PLT 225.0 05/23/2020   GLUCOSE 88 05/23/2020   CHOL 175 05/23/2020   TRIG 50.0 05/23/2020   HDL 57.90 05/23/2020   LDLCALC 107 (H) 05/23/2020   ALT 18 05/23/2020   AST 21 05/23/2020   NA 140 05/23/2020   K 4.2 05/23/2020   CL 103 05/23/2020   CREATININE 1.07 05/23/2020   BUN 17 05/23/2020   CO2 32 05/23/2020   TSH 0.60 05/23/2020   HGBA1C 5.7 05/23/2020   MICROALBUR 1.17 01/02/2009    Lab Results  Component Value Date   TSH 0.60 05/23/2020   Lab Results  Component Value Date   WBC 7.1 05/23/2020   HGB 12.7 05/23/2020   HCT 38.4 05/23/2020   MCV 91.2 05/23/2020   PLT 225.0 05/23/2020   Lab Results  Component Value Date   NA 140 05/23/2020   K 4.2 05/23/2020   CO2 32 05/23/2020   GLUCOSE 88 05/23/2020   BUN 17 05/23/2020   CREATININE 1.07 05/23/2020   BILITOT 0.6 05/23/2020   ALKPHOS 105 05/23/2020   AST 21 05/23/2020   ALT 18 05/23/2020   PROT 7.3 05/23/2020   ALBUMIN 3.8 05/23/2020   CALCIUM 9.9 05/23/2020   ANIONGAP 11 04/07/2018   GFR 54.56 (L)  05/23/2020   Lab Results  Component Value Date   CHOL 175 05/23/2020   Lab Results  Component Value Date   HDL 57.90 05/23/2020   Lab Results  Component Value Date   LDLCALC 107 (H) 05/23/2020   Lab Results  Component Value Date   TRIG 50.0 05/23/2020   Lab Results  Component Value Date   CHOLHDL 3 05/23/2020   Lab Results  Component Value Date   HGBA1C 5.7 05/23/2020       Assessment & Plan:   Problem List Items Addressed This Visit    Essential hypertension - Primary    Well controlled, no changes to meds. Encouraged heart healthy diet such as the DASH diet and exercise as tolerated.       Relevant Orders   CBC (Completed)   Comprehensive metabolic panel (Completed)   TSH (Completed)   Hyperlipidemia, mixed    Encouraged heart healthy diet, increase exercise, avoid trans fats, consider a krill oil cap daily      Relevant Orders   Lipid panel (Completed)   Osteopenia    Encouraged to get adequate exercise, calcium and vitamin d intake      Obesity    Encouraged DASH or MIND diet, decrease po intake and increase exercise as tolerated. Needs 7-8 hours of sleep nightly. Avoid trans fats,  eat small, frequent meals every 4-5 hours with lean proteins, complex carbs and healthy fats. Minimize simple carbs      Hyperglycemia    hgba1c acceptable, minimize simple carbs. Increase exercise as tolerated.       Relevant Orders   Hemoglobin A1c (Completed)    Other Visit Diagnoses    Myalgia       Relevant Orders   Sedimentation rate (Completed)   Ambulatory referral to Physical Therapy   Nocturia       Relevant Orders   Urinalysis (Completed)   Urine Culture (Completed)   Unsteady gait       Relevant Orders   Ambulatory referral to Physical Therapy      I am having Robert Bellow start on ALPRAZolam. I am also having her maintain her multivitamin, cetirizine, furosemide, Cholecalciferol (DIALYVITE VITAMIN D 5000 PO), naproxen sodium,  Carboxymethylcellul-Glycerin (LUBRICATING EYE DROPS OP), brimonidine, Probiotic Product (PROBIOTIC DAILY PO), AZO-CRANBERRY PO, gabapentin, lisinopril-hydrochlorothiazide, buPROPion, fluticasone, sertraline, and methylphenidate.  Meds ordered this encounter  Medications  . ALPRAZolam (XANAX) 0.25 MG tablet    Sig: Take 1 tablet (0.25 mg total) by mouth 2 (two) times daily as needed for anxiety.    Dispense:  20 tablet    Refill:  1     Danise Edge, MD

## 2020-05-30 ENCOUNTER — Ambulatory Visit: Payer: Medicare HMO | Attending: Family Medicine

## 2020-05-30 ENCOUNTER — Other Ambulatory Visit: Payer: Self-pay

## 2020-05-30 ENCOUNTER — Ambulatory Visit: Payer: Medicare HMO | Admitting: Physical Therapy

## 2020-05-30 DIAGNOSIS — M79661 Pain in right lower leg: Secondary | ICD-10-CM | POA: Diagnosis not present

## 2020-05-30 DIAGNOSIS — R2681 Unsteadiness on feet: Secondary | ICD-10-CM | POA: Diagnosis not present

## 2020-05-30 DIAGNOSIS — R2689 Other abnormalities of gait and mobility: Secondary | ICD-10-CM

## 2020-05-30 DIAGNOSIS — M6281 Muscle weakness (generalized): Secondary | ICD-10-CM | POA: Diagnosis not present

## 2020-05-30 NOTE — Therapy (Addendum)
[facility] [address] [place], [place], [address] Phone: [phone]   Fax:  [phone]  Physical Therapy Evaluation  Patient Details  Name: Sydney Pace MRN: [id number] Date of Birth: 06/18/1955 Referring Provider (PT): Penni Homans, MD   Encounter Date: 05/30/2020   PT End of Session - 05/30/20 0900    Visit Number 1    Date for PT Re-Evaluation 07/25/20    Authorization Type [employer] (through 05/31/20), will change to [employer] next year    Progress Note Due on Visit 10    PT Start Time 0828    PT Stop Time 0904    PT Time Calculation (min) 36 min    Activity Tolerance Patient tolerated treatment well    Behavior During Therapy [facility] for tasks assessed/performed           Past Medical History:  Diagnosis Date  . Allergy   . Anemia   . Arthritis   . Arthritis 07/06/2017  . Constipation    occ  . Dehydration 06/09/2016  . Depression   . Esophageal reflux 08/08/2012  . GERD (gastroesophageal reflux disease)   . Hypertension   . Inverted nipple    right nipple has always been inverted - per pt  . MS (multiple sclerosis) ([place])   . Neuromuscular disorder ([place])    MS  . Other and unspecified hyperlipidemia 08/08/2012  . Thyroid disease     Past Surgical History:  Procedure Laterality Date  . COLONOSCOPY    . HYSTEROSCOPY  02, 04, 2008   with D&C  . ORIF WRIST FRACTURE Right 04/07/2018   Procedure: RIGHT WRIST OPEN REDUCTION INTERNAL FIXATION (ORIF);  Surgeon: Meredith Pel, MD;  Location: [place];  Service: Orthopedics;  Laterality: Right;  . TMJ ARTHROSCOPY    . TONSILLECTOMY    . UPPER GASTROINTESTINAL ENDOSCOPY      There were no vitals filed for this visit.    Subjective Assessment - 05/30/20 0827    Subjective Pt presents to PT with LE weakness, balance deficits and gait abnormality- chronic related to MS.    Pertinent History MS,    Limitations Walking;Standing    How long can you stand  comfortably? LE pain with this- 10 minutes with UE support    How long can you walk comfortably? 45 minutes with cane- slow    Patient Stated Goals improve gait, strength, reduce falls    Currently in Pain? Yes    Pain Score 0-No pain   up to 5-6/10 with walking   Pain Location Leg    Pain Orientation Right    Pain Type Chronic pain    Pain Onset More than a month ago    Pain Frequency Intermittent    Aggravating Factors  standing/walking    Pain Relieving Factors sitting              [facility] PT Assessment - 05/30/20 0001      Assessment   Medical Diagnosis myalgia, unsteady gait    Referring Provider (PT) Penni Homans, MD    Onset Date/Surgical Date 05/02/18    Next MD Visit none    Prior Therapy 3.2021 for 2 sessions      Precautions   Precautions Fall      Restrictions   Weight Bearing Restrictions No      Balance Screen   Has the patient fallen in the past 6 months Yes    How many times? 3   PT  will address balance   Has the patient had a decrease in activity level because of a fear of falling?  Yes    Is the patient reluctant to leave their home because of a fear of falling?  Yes      [place] residence    Living Arrangements Alone    Type of [place] One level    [place] - 2 wheels;Shower seat - built in;Cane - quad      Prior Function   Level of Independence Independent    Vocation Retired    Leisure none      Cognition   Overall Cognitive Status Within Functional Limits for tasks assessed      Posture/Postural Control   Posture/Postural Control Postural limitations    Postural Limitations Flexed trunk;Forward head      ROM / Strength   AROM / PROM / Strength AROM;PROM;Strength      AROM   Overall AROM  Deficits    Overall AROM Comments bil hip A/ROM limited by 25-50%      Strength   Overall Strength Deficits    Overall Strength Comments Rt LE  (tested in sitting): hip 4-/5, knee 4/5, ankle 4+/5.  Lt hip and knee 4+/5, ankle 5/5      Ambulation/Gait   Ambulation/Gait Yes    Ambulation/Gait Assistance 6: Modified independent (Device/Increase time)    Assistive device Large base quad cane    Gait Pattern Step-to pattern;Antalgic;Trendelenburg                      Objective measurements completed on examination: See above findings.       [facility] PT Treatment/Exercise - 05/30/20 0001      Transfers   Transfers Sit to Stand;Stand to Sit    Sit to Stand With upper extremity assist;5: Supervision    Five time sit to stand comments  55 seconds wiht max UE support    Stand to Sit 5: Supervision;With upper extremity assist                  PT Education - 05/30/20 0854    Education Details Access Code: [email or web address]    Person(s) Educated Patient    Methods Demonstration;Explanation;Handout    Comprehension Verbalized understanding;Returned demonstration            PT Short Term Goals - 05/30/20 0913      PT SHORT TERM GOAL #1   Title be independent in initial HEP    Time 4    Period Weeks    Target Date 06/27/20      PT SHORT TERM GOAL #2   Title perform 5x sit to stand in < or = to 40 seconds with UE support to reduce falls risk    Baseline 55 with UE support    Time 4    Period Weeks    Status New    Target Date 06/27/20      PT SHORT TERM GOAL #3   Title improve LE strength to perform sit to stand with moderate UE support    Time 4    Period Weeks    Status New    Target Date 06/27/20      PT SHORT TERM GOAL #4   Title --             PT Long Term  Goals - 05/30/20 0915      PT LONG TERM GOAL #1   Title be independent in advanced HEP    Period Weeks    Status New    Target Date 07/25/20      PT LONG TERM GOAL #2   Title Pt able to complete 5x sit to stand in < or = to 30 with UE support to help decrease falls risk    Time 8    Period Weeks    Status New    Target Date  07/25/20      PT LONG TERM GOAL #3   Title Patient to report no falls since initial eval.    Time 8    Period Weeks    Status New    Target Date 07/25/20      PT LONG TERM GOAL #4   Title improve LE strength to be able to negotiate curbs and 2-3 steps safely to allow mobiilty in community    Period Weeks    Status New    Target Date 07/25/20      PT LONG TERM GOAL #5   Title report a 30% reduction in LE pain with standing and walking to improve tolerance for these activities    Baseline --    Time 8    Period Weeks    Status New    Target Date 07/25/20                  Plan - 05/30/20 0910    Clinical Impression Statement Pt presents to PT with bil LE weakness and balance deficits of a chronic nature related to MS and immobility. She reports multiple falls (2-3) in the past 6 months. Pt performed 5x sit to stand with max UE support in 55 seconds making her a significant fall risk. PT advised that walker use will improve her stability. Pt ambulated with a quad cane with reduced step length and speed in the clinic today with antalgia. Pt with Rt>Lt LE weakness and requires max UE support with sit to stand. Pt reports Rt LE pain that limits her ability to stand and walk due to pain with this.  She will benefit from PT to decrease Rt leg pain and increase balance, LE strength and endurance to prevent further falls and injury.    Personal Factors and Comorbidities Comorbidity 2;Social Background    Comorbidities lives alone, MS, HTN, previous falls    Examination-Activity Limitations Stand;Stairs;Squat;Locomotion Level;Transfers    Examination-Participation Restrictions Cleaning;Community Activity;Shop;Meal Prep    Stability/Clinical Decision Making Evolving/Moderate complexity    Clinical Decision Making Moderate    Rehab Potential Good    PT Frequency 2x / week    PT Duration 8 weeks    PT Treatment/Interventions ADLs/Self Care Home Management;Cryotherapy;Electrical  Stimulation;Moist Heat;Gait training;Stair training;Functional mobility training;Therapeutic activities;Therapeutic exercise;Balance training;Neuromuscular re-education;Manual techniques;Passive range of motion;Joint Manipulations;Taping    PT Next Visit Plan work on sit to stand, NuStep, LE strength, balance    PT Home Exercise Plan Access Code: [email or web address]    Consulted and Agree with Plan of Care Patient           Patient will benefit from skilled therapeutic intervention in order to improve the following deficits and impairments:  Abnormal gait,Decreased activity tolerance,Decreased mobility,Decreased strength,Impaired flexibility,Decreased balance,Pain,Decreased endurance,Difficulty walking,Decreased range of motion  Visit Diagnosis: Unsteadiness on feet - Plan: PT plan of care cert/re-cert  Pain in right lower leg - Plan: PT plan of care cert/re-cert  Other  abnormalities of gait and mobility - Plan: PT plan of care cert/re-cert  Muscle weakness (generalized) - Plan: PT plan of care cert/re-cert     Problem List Patient Active Problem List   Diagnosis Date Noted  . Restless leg syndrome 08/03/2019  . Attention deficit disorder (ADD) in adult 08/03/2019  . Weakness of both lower extremities 07/26/2019  . Left shoulder pain 07/26/2019  . Hyperglycemia 07/25/2019  . Shingles 10/09/2018  . Lumbar radiculopathy 08/02/2018  . Numbness 08/02/2018  . Post-menopausal bleeding 03/17/2018  . Dysuria 03/17/2018  . High serum parathyroid hormone (PTH) 01/10/2018  . Hypercalcemia 01/10/2018  . Chronic heel pain, left 07/06/2017  . Arthritis 07/06/2017  . Osteopenia 07/06/2017  . Hx of colonic polyp 07/06/2017  . Cervical cancer screening 07/06/2017  . Obesity 07/06/2017  . Dry eyes, bilateral 10/10/2015  . Low-tension glaucoma of both eyes, moderate stage 10/10/2015  . Nuclear sclerotic cataract of both eyes 10/10/2015  . Partial optic atrophy of left eye 10/10/2015  . Insomnia  08/16/2014  . Optic neuritis 08/16/2014  . Ataxic gait 08/16/2014  . Other fatigue 08/16/2014  . Abdominal pain 04/03/2014  . Colon cancer screening 04/03/2014  . Helicobacter pylori (H. pylori) infection 03/21/2014  . Preventative health care 08/27/2013  . Sinusitis 08/08/2012  . Hyperlipidemia, mixed 08/08/2012  . Allergy 08/08/2012  . Esophageal reflux 08/08/2012  . Depression 01/23/2011  . Leg swelling 01/23/2011  . Multiple sclerosis ([place]) 09/23/2010  . Essential hypertension 09/23/2010  . Overactive bladder 09/23/2010  . Thyroid nodule 09/23/2010  . Pulmonary nodule 09/23/2010  . Fatigue 09/23/2010     Sigurd Sos, PT 05/30/20 9:22 AM PHYSICAL THERAPY DISCHARGE SUMMARY  Visits from Start of Care: 1  Current functional level related to goals / functional outcomes: See above.  Pt didn't return to PT after evaluation.     Remaining deficits: See above-pt didn't return to PT.   Education / Equipment: HEP Plan: Patient agrees to discharge.  Patient goals were not met. Patient is being discharged due to not returning since the last visit.  ?????        Sigurd Sos, PT 07/11/20 7:29 AM  [facility] [address], [place], [place], [address] Phone: [phone]   Fax:  [phone]  Name: Sydney Pace MRN: [id number] Date of Birth: 17-Aug-1955

## 2020-05-30 NOTE — Patient Instructions (Signed)
Access Code: [email or web address] URL: [email or web address] Date: 05/30/2020 Prepared by: Tresa Endo  Exercises Seated Long Arc Quad - 3 x daily - 7 x weekly - 10 reps - 2 sets - 5 hold Seated March - 3 x daily - 7 x weekly - 2 sets - 10 reps Seated Heel Toe Raises - 3 x daily - 7 x weekly - 10 reps - 2 sets Seated Isometric Hip Adduction with Ball - 3 x daily - 7 x weekly - 2 sets - 10 reps - 5 hold Seated Shoulder Horizontal Abduction with Resistance - 1 x daily - 7 x weekly - 2 sets - 10 reps Sit to Stand with Armchair - 3 x daily - 7 x weekly - 1 sets - 5 reps

## 2020-06-12 ENCOUNTER — Ambulatory Visit: Payer: Medicare HMO

## 2020-06-19 ENCOUNTER — Encounter: Payer: Medicare HMO | Admitting: Physical Therapy

## 2020-06-24 ENCOUNTER — Telehealth: Payer: Self-pay | Admitting: Family Medicine

## 2020-06-24 NOTE — Telephone Encounter (Signed)
She should discuss with her neurologist as it is likely related to her MS. Does she feel like she is having any pain or dysfunction in her ankles, knees, hips that is contributing to the falls? If so I could refer to ortho for evaluation

## 2020-06-24 NOTE — Telephone Encounter (Signed)
Patient states she continues to fall, patient would like other recommendations other than physical therapy.  Please advise

## 2020-06-25 NOTE — Telephone Encounter (Signed)
Pt states that she thinks their may be something wrong with her right leg and has pain in it. She said that she stands up on it is painful.

## 2020-06-25 NOTE — Telephone Encounter (Signed)
I think she needs a phone visit, I can do one tomorrow morning, I need to know more about her pain, where, how long etc. I am doing covid visits from 8-9 but could see her right after that.

## 2020-06-25 NOTE — Telephone Encounter (Signed)
LM requesting call back to discuss details of leg pain and offer VV appt with PCP. Per PCP, okay to schedule on 06/26/20 around 10-11am.

## 2020-06-26 ENCOUNTER — Telehealth: Payer: Self-pay | Admitting: Physical Therapy

## 2020-06-26 ENCOUNTER — Ambulatory Visit: Payer: Medicare HMO | Attending: Family Medicine | Admitting: Physical Therapy

## 2020-06-26 NOTE — Telephone Encounter (Signed)
PTA called pt for missed appt. Left message to call our office to let us know if she is able to continue with PT at this time. PTA indicated pt has scheduled appt next Wednesday 2/2. Myrene Galas, PTA @TODAY @ 9:14 AM

## 2020-06-27 NOTE — Telephone Encounter (Signed)
Attempted to contact patient to check status of leg pain.

## 2020-07-02 NOTE — Telephone Encounter (Signed)
Attempted to contact pt to check her leg pain status.

## 2020-07-03 ENCOUNTER — Telehealth: Payer: Self-pay

## 2020-07-03 ENCOUNTER — Ambulatory Visit: Payer: Medicare HMO | Attending: Family Medicine

## 2020-07-03 NOTE — Telephone Encounter (Signed)
PT left message that due to 2 consecutive no-show appointments that we will cancel remaining appts and D/C at this time.

## 2020-07-11 ENCOUNTER — Other Ambulatory Visit: Payer: Self-pay | Admitting: Neurology

## 2020-07-11 MED ORDER — METHYLPHENIDATE HCL 20 MG PO TABS: ORAL_TABLET | ORAL | 0 refills | Status: DC

## 2020-07-11 NOTE — Telephone Encounter (Signed)
Pt request refill methylphenidate (RITALIN) 20 MG tablet at [id number]  

## 2020-08-08 ENCOUNTER — Ambulatory Visit: Payer: Medicare HMO | Admitting: Neurology

## 2020-08-12 ENCOUNTER — Other Ambulatory Visit: Payer: Self-pay | Admitting: Family Medicine

## 2020-08-12 ENCOUNTER — Other Ambulatory Visit: Payer: Self-pay | Admitting: *Deleted

## 2020-08-12 DIAGNOSIS — Z1231 Encounter for screening mammogram for malignant neoplasm of breast: Secondary | ICD-10-CM

## 2020-08-23 ENCOUNTER — Telehealth: Payer: Self-pay | Admitting: Family Medicine

## 2020-08-23 NOTE — Telephone Encounter (Signed)
Medication: sertraline (ZOLOFT) 100 MG tablet [[id number]]    Has the patient contacted their pharmacy? No. (If no, request that the patient contact the pharmacy for the refill.) (If yes, when and what did the pharmacy advise?)  Preferred Pharmacy (with phone number or street name): [place], [place] - [address]  [place] [address]  Phone:  [phone] Fax:  [phone]  DEA #:  --  Agent: Please be advised that RX refills may take up to 3 business days. We ask that you follow-up with your pharmacy.

## 2020-08-23 NOTE — Telephone Encounter (Signed)
LVM to call in to make an appointment

## 2020-08-26 MED ORDER — SERTRALINE HCL 100 MG PO TABS: 100.0000 mg | ORAL_TABLET | Freq: Every day | ORAL | 0 refills | Status: DC

## 2020-09-02 ENCOUNTER — Encounter: Payer: Self-pay | Admitting: Family Medicine

## 2020-09-02 ENCOUNTER — Ambulatory Visit (INDEPENDENT_AMBULATORY_CARE_PROVIDER_SITE_OTHER): Payer: Medicare HMO | Admitting: Family Medicine

## 2020-09-02 ENCOUNTER — Other Ambulatory Visit: Payer: Self-pay

## 2020-09-02 ENCOUNTER — Other Ambulatory Visit: Payer: Self-pay | Admitting: Family Medicine

## 2020-09-02 VITALS — BP 144/84 | HR 87 | Temp 97.9°F | Ht 66.0 in | Wt 269.5 lb

## 2020-09-02 DIAGNOSIS — L309 Dermatitis, unspecified: Secondary | ICD-10-CM

## 2020-09-02 MED ORDER — TRIAMCINOLONE ACETONIDE 0.1 % EX CREA: 1.0000 "application " | TOPICAL_CREAM | Freq: Two times a day (BID) | CUTANEOUS | 0 refills | Status: DC

## 2020-09-02 NOTE — Patient Instructions (Addendum)
Avoid scented products until this is improved.   Consider Zytrec, Claritin, Allegra for itching.   Consider ice for itching.   The new Shingrix vaccine (for shingles) is a 2 shot series. It can make people feel low energy, achy and almost like they have the flu for 48 hours after injection. Please plan accordingly when deciding on when to get this shot. Call our office for a nurse visit appointment to get this. The second shot of the series is less severe regarding the side effects, but it still lasts 48 hours.   Let us know if you need anything.

## 2020-09-02 NOTE — Progress Notes (Signed)
Chief Complaint  Patient presents with  . Rash    Rash on neck and thinks may be shingles    Sydney Pace is a 65 y.o. female here for a skin complaint.  Duration: 1 week Location: back of neck Pruritic? Yes Painful? Yes - burning Drainage? No New soaps/lotions/topicals/detergents? No Sick contacts? No Other associated symptoms: Pt w hx of shingles on her back, feels similar Therapies tried thus far: HC cream  Past Medical History:  Diagnosis Date  . Allergy   . Anemia   . Arthritis   . Arthritis 07/06/2017  . Constipation    occ  . Dehydration 06/09/2016  . Depression   . Esophageal reflux 08/08/2012  . GERD (gastroesophageal reflux disease)   . Hypertension   . Inverted nipple    right nipple has always been inverted - per pt  . MS (multiple sclerosis) ([place])   . Neuromuscular disorder ([place])    MS  . Other and unspecified hyperlipidemia 08/08/2012  . Thyroid disease     BP (!) 144/84 (BP Location: Left Arm, Patient Position: Sitting, Cuff Size: Large)   Pulse 87   Temp 97.9 F (36.6 C) (Oral)   Ht 5\' 6"  (1.676 m)   Wt 269 lb 8 oz (122.2 kg)   LMP  (LMP Unknown)   SpO2 99%   BMI 43.50 kg/m  Gen: awake, alert, appearing stated age Lungs: No accessory muscle use Skin: hyperpigmented patches and macules on base of neck b/l. No drainage, erythema, TTP, fluctuance, excoriation, crusting or vesicles Psych: Age appropriate judgment and insight  Dermatitis - Plan: triamcinolone (KENALOG) 0.1 %  I don't think this is shingles. Burning likely from itching. Above therapy bid for 7-10 d. Try not to itch, avoid scented products, consider PO antihistamines 2nd gen.  F/u prn. The patient voiced understanding and agreement to the plan.  Dania [place], DO 09/02/20 2:31 PM

## 2020-09-02 NOTE — Telephone Encounter (Signed)
Cream, but ointment is fine if that is all they have.

## 2020-09-02 NOTE — Telephone Encounter (Signed)
Do you want cream or ointment

## 2020-09-10 ENCOUNTER — Other Ambulatory Visit: Payer: Self-pay | Admitting: Neurology

## 2020-09-10 MED ORDER — METHYLPHENIDATE HCL 20 MG PO TABS: ORAL_TABLET | ORAL | 0 refills | Status: DC

## 2020-09-10 NOTE — Telephone Encounter (Signed)
Pt request refill methylphenidate (RITALIN) 20 MG tablet at [facility]

## 2020-09-16 DIAGNOSIS — E785 Hyperlipidemia, unspecified: Secondary | ICD-10-CM | POA: Diagnosis not present

## 2020-09-16 DIAGNOSIS — G35 Multiple sclerosis: Secondary | ICD-10-CM | POA: Diagnosis not present

## 2020-09-16 DIAGNOSIS — I10 Essential (primary) hypertension: Secondary | ICD-10-CM | POA: Diagnosis not present

## 2020-09-16 DIAGNOSIS — Z1331 Encounter for screening for depression: Secondary | ICD-10-CM | POA: Diagnosis not present

## 2020-09-16 DIAGNOSIS — Z681 Body mass index (BMI) 19 or less, adult: Secondary | ICD-10-CM | POA: Diagnosis not present

## 2020-10-03 ENCOUNTER — Ambulatory Visit
Admission: RE | Admit: 2020-10-03 | Discharge: 2020-10-03 | Disposition: A | Discharge status: Home / Self Care | Payer: Medicare HMO | Source: Ambulatory Visit | Attending: Family Medicine | Admitting: Family Medicine

## 2020-10-03 ENCOUNTER — Other Ambulatory Visit: Payer: Self-pay

## 2020-10-03 DIAGNOSIS — Z1231 Encounter for screening mammogram for malignant neoplasm of breast: Secondary | ICD-10-CM | POA: Diagnosis not present

## 2020-10-04 DIAGNOSIS — H401122 Primary open-angle glaucoma, left eye, moderate stage: Secondary | ICD-10-CM | POA: Diagnosis not present

## 2020-10-04 DIAGNOSIS — H52221 Regular astigmatism, right eye: Secondary | ICD-10-CM | POA: Diagnosis not present

## 2020-10-04 DIAGNOSIS — H5203 Hypermetropia, bilateral: Secondary | ICD-10-CM | POA: Diagnosis not present

## 2020-10-04 DIAGNOSIS — H2513 Age-related nuclear cataract, bilateral: Secondary | ICD-10-CM | POA: Diagnosis not present

## 2020-10-04 DIAGNOSIS — H47292 Other optic atrophy, left eye: Secondary | ICD-10-CM | POA: Diagnosis not present

## 2020-10-04 DIAGNOSIS — H524 Presbyopia: Secondary | ICD-10-CM | POA: Diagnosis not present

## 2020-10-04 DIAGNOSIS — H40021 Open angle with borderline findings, high risk, right eye: Secondary | ICD-10-CM | POA: Diagnosis not present

## 2020-10-08 ENCOUNTER — Ambulatory Visit (INDEPENDENT_AMBULATORY_CARE_PROVIDER_SITE_OTHER): Payer: Medicare HMO | Admitting: Neurology

## 2020-10-08 ENCOUNTER — Encounter: Payer: Self-pay | Admitting: Neurology

## 2020-10-08 VITALS — Ht 66.5 in | Wt 272.0 lb

## 2020-10-08 DIAGNOSIS — N3281 Overactive bladder: Secondary | ICD-10-CM | POA: Diagnosis not present

## 2020-10-08 DIAGNOSIS — R69 Illness, unspecified: Secondary | ICD-10-CM | POA: Diagnosis not present

## 2020-10-08 DIAGNOSIS — G35D Multiple sclerosis, unspecified: Secondary | ICD-10-CM

## 2020-10-08 DIAGNOSIS — G2581 Restless legs syndrome: Secondary | ICD-10-CM

## 2020-10-08 DIAGNOSIS — F988 Other specified behavioral and emotional disorders with onset usually occurring in childhood and adolescence: Secondary | ICD-10-CM

## 2020-10-08 DIAGNOSIS — R260 Ataxic gait: Secondary | ICD-10-CM | POA: Diagnosis not present

## 2020-10-08 DIAGNOSIS — G35 Multiple sclerosis: Secondary | ICD-10-CM

## 2020-10-08 DIAGNOSIS — F32A Depression, unspecified: Secondary | ICD-10-CM

## 2020-10-08 NOTE — Progress Notes (Signed)
u  [facility]  PATIENT: Sydney Pace DOB: September 12, 1955  REFERRING DOCTOR OR PCP:  Sydney Pace  _________________________________   HISTORICAL  CHIEF COMPLAINT:  Chief Complaint  Patient presents with  . Follow-up    Pt alone, rm 12. Pt states overall stable. Has been dealing with depression but indicated that she has been having a lot of loss recently       HISTORY OF PRESENT ILLNESS:  Sydney Pace is a 65 y.o. woman with multiple sclerosis.      Update 10/08/2020 She has not had any MS exacerbations and has been off DMTs since late 2014   MRIs between 2007 and 2018 were stable.    She never did the last Brain MRi we ordered.     Gait is off balanced.  She uses a cane and walks slowly as she is concerned about falling.    She reports her legs are feeling worse.    Pain is dysesthetic, worse in lower legs.    She has mild urinary hesitancy, not troubling, and no recent UTI's. Urgency helped by oxybutynin.    Vision is stable.     She is having more depression with the recent passing of her best friend and her sister-in-law   She is on [place] and Zoloft.  She used to do counseling more than 4 years ago but no one recently.   These have helped but incompletely.  She does not note anxiety or agitation.      She has poor focus and attention and it is  helped a lot by methylphenidate.    She tolerates it well.   She takes at least one every day and two some days.     She has insomnia.   The RLS is doing better.   She takes gabapentin 300 mg at bedtime.     She has done the vaccination for Covid-19.  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  MRI was performed in 1983 and was consistent with MS.     In the late 1990's, she was started on Copaxone but stopped after several weeks due to skin reactions. She also tried Avonex but had a rash and stopped. She started Gilenya in January 2014 but stopped after 7  or 8 months due to being more fatigue and having flulike symptoms. In May 2015 we had discussed Aubagio. She decided not to start.     MRI 02/28/2017 showed Multiple T2/FLAIR hyperintense foci in the hemispheres and thalamus in a pattern and configuration consistent with chronic demyelinating plaque associated with multiple sclerosis. None of the foci appears to be acute. When compared to the MRI dated 04/03/2006, there is no significant interval change.      Generalized cortical atrophy and corpus callosum atrophy  REVIEW OF SYSTEMS: Constitutional: No fevers, chills, sweats, or change in appetite.  She has fatigue and insomnia Eyes: see above.  No double vision, eye pain..  Some eye redness Ear, nose and throat: No hearing loss, ear pain, nasal congestion, sore throat Cardiovascular: No chest pain, palpitations Respiratory: No shortness of breath at rest or with exertion.   No wheezes GastrointestinaI: No nausea, vomiting, diarrhea, abdominal pain, fecal incontinence Genitourinary: Mild urinary frequency.  No nocturia. Musculoskeletal: No neck pain, back pain.  Some hip pain Integumentary: No rash, pruritus, skin lesions Neurological: as above Psychiatric: Mild depression at this time, rarely cries, less anxiety Endocrine: No  palpitations, diaphoresis, change in appetite, change in weigh or increased thirst Hematologic/Lymphatic: No anemia, purpura, petechiae. Allergic/Immunologic: No itchy/runny eyes, nasal congestion, recent allergic reactions, rashes  ALLERGIES: Allergies  Allergen Reactions  . Shellfish Allergy Hives, Shortness Of Breath, Itching, Swelling and Rash    Pt had previously carried an epi-pen and had a history of severe reaction to shrimp with breathing problems and swelling of lips and tongue     HOME MEDICATIONS:  Current Outpatient Medications:  .  ALPRAZolam (XANAX) 0.25 MG tablet, Take 1 tablet (0.25 mg total) by mouth 2 (two) times daily as needed for  anxiety., Disp: 20 tablet, Rfl: 1 .  AZO-CRANBERRY PO, Take by mouth., Disp: , Rfl:  .  brimonidine (ALPHAGAN) 0.2 % ophthalmic solution, Place 1 drop into both eyes 2 (two) times daily., Disp: , Rfl:  .  buPROPion (WELLBUTRIN XL) 300 MG 24 hr tablet, Take 1 tablet (300 mg total) by mouth daily., Disp: 90 tablet, Rfl: 1 .  Carboxymethylcellul-Glycerin (LUBRICATING EYE DROPS OP), Place 1 drop into both eyes daily as needed (dry eyes)., Disp: , Rfl:  .  cetirizine (ZYRTEC) 10 MG tablet, Take 10 mg by mouth every evening. , Disp: , Rfl:  .  Cholecalciferol (DIALYVITE VITAMIN D 5000 PO), Take 4,000 Units by mouth daily. , Disp: , Rfl:  .  fluticasone (FLONASE) 50 MCG/ACT nasal spray, Place 2 sprays into both nostrils daily as needed for allergies or rhinitis., Disp: 48 g, Rfl: 1 .  furosemide (LASIX) 20 MG tablet, Take 1 tablet (20 mg total) by mouth daily as needed., Disp: 90 tablet, Rfl: 1 .  gabapentin (NEURONTIN) 100 MG capsule, TAKE 1 CAPSULE BY MOUTH THREE TIMES DAILY, Disp: 90 capsule, Rfl: 0 .  lisinopril-hydrochlorothiazide (ZESTORETIC) 20-25 MG tablet, Take 1 tablet by mouth once daily, Disp: 90 tablet, Rfl: 0 .  methylphenidate (RITALIN) 20 MG tablet, One po qAM and one po qNoon, Disp: 60 tablet, Rfl: 0 .  Multiple Vitamin (MULTIVITAMIN) tablet, Take 1 tablet by mouth daily., Disp: , Rfl:  .  naproxen sodium (ALEVE) 220 MG tablet, Take 220 mg by mouth daily as needed (pain)., Disp: , Rfl:  .  oxybutynin (DITROPAN) 5 MG tablet, Take 1 tablet by mouth twice daily, Disp: 180 tablet, Rfl: 3 .  Probiotic Product (PROBIOTIC DAILY PO), Take by mouth., Disp: , Rfl:  .  sertraline (ZOLOFT) 100 MG tablet, Take 1 tablet (100 mg total) by mouth daily., Disp: 90 tablet, Rfl: 0 .  triamcinolone (KENALOG) 0.1 %, APPLY 1 APPLICATION OF CREAM TWICE DAILY, Disp: 30 g, Rfl: 0  PAST MEDICAL HISTORY: Past Medical History:  Diagnosis Date  . Allergy   . Anemia   . Arthritis   . Arthritis 07/06/2017  .  Constipation    occ  . Dehydration 06/09/2016  . Depression   . Esophageal reflux 08/08/2012  . GERD (gastroesophageal reflux disease)   . Hypertension   . Inverted nipple    right nipple has always been inverted - per pt  . MS (multiple sclerosis) ([place])   . Neuromuscular disorder ([place])    MS  . Other and unspecified hyperlipidemia 08/08/2012  . Thyroid disease     PAST SURGICAL HISTORY: Past Surgical History:  Procedure Laterality Date  . COLONOSCOPY    . HYSTEROSCOPY  02, 04, 2008   with D&C  . ORIF WRIST FRACTURE Right 04/07/2018   Procedure: RIGHT WRIST OPEN REDUCTION INTERNAL FIXATION (ORIF);  Surgeon: Meredith Pel, MD;  Location:  [facility];  Service: Orthopedics;  Laterality: Right;  . TMJ ARTHROSCOPY    . TONSILLECTOMY    . UPPER GASTROINTESTINAL ENDOSCOPY      FAMILY HISTORY: Family History  Problem Relation Age of Onset  . Heart disease Mother        pacemaker  . Emphysema Mother   . Hypertension Mother   . Heart disease Father   . Diabetes Father   . Heart disease Sister        cad  . Sleep apnea Brother   . Colon cancer Neg Hx   . Esophageal cancer Neg Hx   . Rectal cancer Neg Hx   . Stomach cancer Neg Hx   . Colon polyps Neg Hx     SOCIAL HISTORY:  Social History   Socioeconomic History  . Marital status: Single    Spouse name: Not on file  . Number of children: Not on file  . Years of education: Not on file  . Highest education level: Not on file  Occupational History  . Not on file  Tobacco Use  . Smoking status: Never Smoker  . Smokeless tobacco: Never Used  Vaping Use  . Vaping Use: Never used  Substance and Sexual Activity  . Alcohol use: Yes    Alcohol/week: 0.0 standard drinks    Comment: occassional  . Drug use: No  . Sexual activity: Never    Comment: 1st intercourse 13 yo-5 partners  Other Topics Concern  . Not on file  Social History Narrative  . Not on file   Social Determinants of Health   Financial Resource Strain:  Not on file  Food Insecurity: Not on file  Transportation Needs: Not on file  Physical Activity: Not on file  Stress: Not on file  Social Connections: Not on file  Intimate Partner Violence: Not on file     PHYSICAL EXAM  Vitals:   10/08/20 1622  Weight: 272 lb (123.4 kg)  Height: 5' 6.5" (1.689 m)    Body mass index is 43.24 kg/m.   General: The patient is well-developed and well-nourished and in no acute distress  Neurologic Exam  Mental status: The patient is alert and oriented x 3 at the time of the examination. The patient has apparent normal recent and remote memory, with an apparently normal attention span and concentration ability.   Speech is normal.  Cranial nerves: Extraocular movements are full.  She has reduced color vision on the left.  Facial strength was normal.   No dysarthria is noted.   Hearing seems normal.   Motor:  Muscle bulk is normal.  Strength is 5/5 except for 4/5 strength in the ulnar innervated hand muscles and 4+/5 in the EHL muscles.  Sensory: Symmetric sensation in proximal arms to touch, temp and vibration.  But reduced left leg temperature sensation.   Coordination: Cerebellar testing reveals good finger-nose-finger bilaterally.  Gait and station: Station is normal but she needs to use her arms to stand up and has hip/leg pain.  She has a reduced stride and mildly wide gait.  She is able to walk better with her cane..     She cannot do a tandem walk.  Romberg is negative.  Reflexes: Deep tendon reflexes are symmetric and normal in arms, 3+ at knees with mild spread and 2 at the ankles.Marland Kitchen        DIAGNOSTIC DATA (LABS, IMAGING, TESTING) - I reviewed patient records, labs, notes, testing and imaging myself where available.  Lab  Results  Component Value Date   WBC 7.1 05/23/2020   HGB 12.7 05/23/2020   HCT 38.4 05/23/2020   MCV 91.2 05/23/2020   PLT 225.0 05/23/2020      Component Value Date/Time   NA 140 05/23/2020 1247   K 4.2  05/23/2020 1247   CL 103 05/23/2020 1247   CO2 32 05/23/2020 1247   GLUCOSE 88 05/23/2020 1247   BUN 17 05/23/2020 1247   CREATININE 1.07 05/23/2020 1247   CREATININE 0.91 04/22/2016 1730   CALCIUM 9.9 05/23/2020 1247   PROT 7.3 05/23/2020 1247   ALBUMIN 3.8 05/23/2020 1247   AST 21 05/23/2020 1247   ALT 18 05/23/2020 1247   ALKPHOS 105 05/23/2020 1247   BILITOT 0.6 05/23/2020 1247   GFRNONAA 53 (L) 04/07/2018 1330   GFRAA >60 04/07/2018 1330   Lab Results  Component Value Date   CHOL 175 05/23/2020   HDL 57.90 05/23/2020   LDLCALC 107 (H) 05/23/2020   TRIG 50.0 05/23/2020   CHOLHDL 3 05/23/2020   Lab Results  Component Value Date   HGBA1C 5.7 05/23/2020   Lab Results  Component Value Date   WIOMBTDH74 163 07/25/2019   Lab Results  Component Value Date   TSH 0.60 05/23/2020       ASSESSMENT AND PLAN  Multiple sclerosis (HCC)  Ataxic gait  Overactive bladder  Attention deficit disorder (ADD) in adult  Depression, unspecified depression type  Restless leg syndrome   1.   She will stay off of an MS DMT.  Repeat MRI (she did not get last year) and go back on DMT if more than two new lesions (previous MRi 2018)  2.    Continue Ritalin for attention, wakefulness and fatigue 3.   Continue gabapentin for restless leg syndrome.  Continue Zoloft and Wellbutrin for her mood.  She would like to be referred back to behavioral health for her depression and grief issues 4.    Continue Vit D supplements and stay active. 5.   Stay active.   Use cane for safety. 6.   She will return to see [place] in 6 months or sooner if she has new or worsening neurologic symptoms.   Zula Hovsepian A. Felecia Shelling, MD, PhD 8/45/3646, 8:03 PM Certified in Neurology, Clinical Neurophysiology, Sleep Medicine, Pain Medicine and Neuroimaging  [facility] [address] [place], [place] [address] [phone]

## 2020-10-15 ENCOUNTER — Other Ambulatory Visit: Payer: Self-pay

## 2020-10-15 ENCOUNTER — Telehealth: Payer: Self-pay

## 2020-10-15 MED ORDER — BUPROPION HCL ER (XL) 300 MG PO TB24: 300.0000 mg | ORAL_TABLET | Freq: Every day | ORAL | 1 refills | Status: DC

## 2020-10-15 NOTE — Telephone Encounter (Signed)
Pt called stating she needs refill on buproprion.  Pharmacy is [facility] [address]  Pt also made a f/up appt w/PCP in Aug.

## 2020-10-15 NOTE — Telephone Encounter (Signed)
Medication sent.

## 2020-11-28 ENCOUNTER — Other Ambulatory Visit: Payer: Self-pay | Admitting: Neurology

## 2020-11-28 MED ORDER — METHYLPHENIDATE HCL 20 MG PO TABS: ORAL_TABLET | ORAL | 0 refills | Status: DC

## 2020-11-28 NOTE — Telephone Encounter (Signed)
Pt requeting refill for methylphenidate (RITALIN) 20 MG tablet. [place] [phone].

## 2020-12-14 ENCOUNTER — Other Ambulatory Visit: Payer: Self-pay | Admitting: Family Medicine

## 2021-01-21 ENCOUNTER — Other Ambulatory Visit: Payer: Self-pay | Admitting: Neurology

## 2021-01-21 MED ORDER — METHYLPHENIDATE HCL 20 MG PO TABS: ORAL_TABLET | ORAL | 0 refills | Status: DC

## 2021-01-21 NOTE — Telephone Encounter (Signed)
Pt request refill methylphenidate (RITALIN) 20 MG tablet at [facility]

## 2021-01-27 ENCOUNTER — Other Ambulatory Visit: Payer: Self-pay

## 2021-01-27 ENCOUNTER — Ambulatory Visit: Payer: Medicare (Managed Care) | Admitting: Family Medicine

## 2021-01-27 MED ORDER — LISINOPRIL-HYDROCHLOROTHIAZIDE 20-25 MG PO TABS: 1.0000 | ORAL_TABLET | Freq: Every day | ORAL | 0 refills | Status: DC

## 2021-01-27 NOTE — Progress Notes (Incomplete)
Subjective:   By signing my name below, I, Sydney Pace, attest that this documentation has been prepared under the direction and in the presence of Sydney Lukes, MD. 01/27/2021   Patient ID: Sydney Pace, female    DOB: 1955/11/29, 65 y.o.   MRN: [id number]  No chief complaint on file.   HPI Patient is in today for an office visit.  Past Medical History:  Diagnosis Date   Allergy    Anemia    Arthritis    Arthritis 07/06/2017   Constipation    occ   Dehydration 06/09/2016   Depression    Esophageal reflux 08/08/2012   GERD (gastroesophageal reflux disease)    Hypertension    Inverted nipple    right nipple has always been inverted - per pt   MS (multiple sclerosis) ([place])    Neuromuscular disorder ([place])    MS   Other and unspecified hyperlipidemia 08/08/2012   Thyroid disease     Past Surgical History:  Procedure Laterality Date   COLONOSCOPY     HYSTEROSCOPY  02, 04, 2008   with D&C   ORIF WRIST FRACTURE Right 04/07/2018   Procedure: RIGHT WRIST OPEN REDUCTION INTERNAL FIXATION (ORIF);  Surgeon: Meredith Pel, MD;  Location: [facility];  Service: Orthopedics;  Laterality: Right;   TMJ ARTHROSCOPY     TONSILLECTOMY     UPPER GASTROINTESTINAL ENDOSCOPY      Family History  Problem Relation Age of Onset   Heart disease Mother        pacemaker   Emphysema Mother    Hypertension Mother    Heart disease Father    Diabetes Father    Heart disease Sister        cad   Sleep apnea Brother    Colon cancer Neg Hx    Esophageal cancer Neg Hx    Rectal cancer Neg Hx    Stomach cancer Neg Hx    Colon polyps Neg Hx     Social History   Socioeconomic History   Marital status: Single    Spouse name: Not on file   Number of children: Not on file   Years of education: Not on file   Highest education level: Not on file  Occupational History   Not on file  Tobacco Use   Smoking status: Never   Smokeless tobacco: Never  Vaping Use   Vaping Use: Never used   Substance and Sexual Activity   Alcohol use: Yes    Alcohol/week: 0.0 standard drinks    Comment: occassional   Drug use: No   Sexual activity: Never    Comment: 1st intercourse 21 yo-5 partners  Other Topics Concern   Not on file  Social History Narrative   Not on file   Social Determinants of Health   Financial Resource Strain: Not on file  Food Insecurity: Not on file  Transportation Needs: Not on file  Physical Activity: Not on file  Stress: Not on file  Social Connections: Not on file  Intimate Partner Violence: Not on file    Outpatient Medications Prior to Visit  Medication Sig Dispense Refill   ALPRAZolam (XANAX) 0.25 MG tablet Take 1 tablet (0.25 mg total) by mouth 2 (two) times daily as needed for anxiety. 20 tablet 1   AZO-CRANBERRY PO Take by mouth.     brimonidine (ALPHAGAN) 0.2 % ophthalmic solution Place 1 drop into both eyes 2 (two) times daily.     buPROPion (WELLBUTRIN XL)  300 MG 24 hr tablet Take 1 tablet (300 mg total) by mouth daily. 90 tablet 1   Carboxymethylcellul-Glycerin (LUBRICATING EYE DROPS OP) Place 1 drop into both eyes daily as needed (dry eyes).     cetirizine (ZYRTEC) 10 MG tablet Take 10 mg by mouth every evening.      Cholecalciferol (DIALYVITE VITAMIN D 5000 PO) Take 4,000 Units by mouth daily.      fluticasone (FLONASE) 50 MCG/ACT nasal spray Place 2 sprays into both nostrils daily as needed for allergies or rhinitis. 48 g 1   furosemide (LASIX) 20 MG tablet Take 1 tablet (20 mg total) by mouth daily as needed. 90 tablet 1   gabapentin (NEURONTIN) 100 MG capsule TAKE 1 CAPSULE BY MOUTH THREE TIMES DAILY 90 capsule 0   lisinopril-hydrochlorothiazide (ZESTORETIC) 20-25 MG tablet Take 1 tablet by mouth once daily 90 tablet 0   methylphenidate (RITALIN) 20 MG tablet One po qAM and one po qNoon 60 tablet 0   Multiple Vitamin (MULTIVITAMIN) tablet Take 1 tablet by mouth daily.     naproxen sodium (ALEVE) 220 MG tablet Take 220 mg by mouth daily  as needed (pain).     oxybutynin (DITROPAN) 5 MG tablet Take 1 tablet by mouth twice daily 180 tablet 3   Probiotic Product (PROBIOTIC DAILY PO) Take by mouth.     sertraline (ZOLOFT) 100 MG tablet Take 1 tablet by mouth once daily 90 tablet 0   triamcinolone (KENALOG) 0.1 % APPLY 1 APPLICATION OF CREAM TWICE DAILY 30 g 0   No facility-administered medications prior to visit.    Allergies  Allergen Reactions   Shellfish Allergy Hives, Shortness Of Breath, Itching, Swelling and Rash    Pt had previously carried an epi-pen and had a history of severe reaction to shrimp with breathing problems and swelling of lips and tongue     Review of Systems  Constitutional:  Negative for fever and malaise/fatigue.  HENT:  Negative for congestion.   Eyes:  Negative for redness.  Respiratory:  Negative for shortness of breath.   Cardiovascular:  Negative for chest pain, palpitations and leg swelling.  Gastrointestinal:  Negative for abdominal pain, blood in stool and nausea.  Genitourinary:  Negative for dysuria and frequency.  Musculoskeletal:  Negative for falls.  Skin:  Negative for rash.  Neurological:  Negative for dizziness, loss of consciousness and headaches.  Endo/Heme/Allergies:  Negative for polydipsia.  Psychiatric/Behavioral:  Negative for depression. The patient is not nervous/anxious.       Objective:    Physical Exam Constitutional:      General: She is not in acute distress.    Appearance: She is well-developed.  HENT:     Head: Normocephalic and atraumatic.  Eyes:     Conjunctiva/sclera: Conjunctivae normal.  Neck:     Thyroid: No thyromegaly.  Cardiovascular:     Rate and Rhythm: Normal rate and regular rhythm.     Heart sounds: Normal heart sounds. No murmur heard. Pulmonary:     Effort: Pulmonary effort is normal. No respiratory distress.     Breath sounds: Normal breath sounds.  Abdominal:     General: Bowel sounds are normal. There is no distension.      Palpations: Abdomen is soft. There is no mass.     Tenderness: There is no abdominal tenderness.  Musculoskeletal:     Cervical back: Neck supple.  Lymphadenopathy:     Cervical: No cervical adenopathy.  Skin:    General: Skin is  warm and dry.  Neurological:     Mental Status: She is alert and oriented to person, place, and time.  Psychiatric:        Behavior: Behavior normal.    LMP  (LMP Unknown)  Wt Readings from Last 3 Encounters:  10/08/20 272 lb (123.4 kg)  09/02/20 269 lb 8 oz (122.2 kg)  05/23/20 283 lb 9.6 oz (128.6 kg)    Diabetic Foot Exam - Simple   No data filed    Lab Results  Component Value Date   WBC 7.1 05/23/2020   HGB 12.7 05/23/2020   HCT 38.4 05/23/2020   PLT 225.0 05/23/2020   GLUCOSE 88 05/23/2020   CHOL 175 05/23/2020   TRIG 50.0 05/23/2020   HDL 57.90 05/23/2020   LDLCALC 107 (H) 05/23/2020   ALT 18 05/23/2020   AST 21 05/23/2020   NA 140 05/23/2020   K 4.2 05/23/2020   CL 103 05/23/2020   CREATININE 1.07 05/23/2020   BUN 17 05/23/2020   CO2 32 05/23/2020   TSH 0.60 05/23/2020   HGBA1C 5.7 05/23/2020   MICROALBUR 1.17 01/02/2009    Lab Results  Component Value Date   TSH 0.60 05/23/2020   Lab Results  Component Value Date   WBC 7.1 05/23/2020   HGB 12.7 05/23/2020   HCT 38.4 05/23/2020   MCV 91.2 05/23/2020   PLT 225.0 05/23/2020   Lab Results  Component Value Date   NA 140 05/23/2020   K 4.2 05/23/2020   CO2 32 05/23/2020   GLUCOSE 88 05/23/2020   BUN 17 05/23/2020   CREATININE 1.07 05/23/2020   BILITOT 0.6 05/23/2020   ALKPHOS 105 05/23/2020   AST 21 05/23/2020   ALT 18 05/23/2020   PROT 7.3 05/23/2020   ALBUMIN 3.8 05/23/2020   CALCIUM 9.9 05/23/2020   ANIONGAP 11 04/07/2018   GFR 54.56 (L) 05/23/2020   Lab Results  Component Value Date   CHOL 175 05/23/2020   Lab Results  Component Value Date   HDL 57.90 05/23/2020   Lab Results  Component Value Date   LDLCALC 107 (H) 05/23/2020   Lab Results   Component Value Date   TRIG 50.0 05/23/2020   Lab Results  Component Value Date   CHOLHDL 3 05/23/2020   Lab Results  Component Value Date   HGBA1C 5.7 05/23/2020       Assessment & Plan:   Problem List Items Addressed This Visit   None   No orders of the defined types were placed in this encounter.   I,Sydney Pace,acting as a Education administrator for Penni Homans, MD.,have documented all relevant documentation on the behalf of Penni Homans, MD,as directed by  Penni Homans, MD while in the presence of Penni Homans, MD.   I, Sydney Lukes, MD., personally preformed the services described in this documentation.  All medical record entries made by the scribe were at my direction and in my presence.  I have reviewed the chart and discharge instructions (if applicable) and agree that the record reflects my personal performance and is accurate and complete. 01/27/2021

## 2021-01-30 ENCOUNTER — Ambulatory Visit (INDEPENDENT_AMBULATORY_CARE_PROVIDER_SITE_OTHER): Payer: Medicare (Managed Care) | Admitting: Family Medicine

## 2021-01-30 ENCOUNTER — Other Ambulatory Visit: Payer: Self-pay

## 2021-01-30 ENCOUNTER — Encounter: Payer: Self-pay | Admitting: Family Medicine

## 2021-01-30 VITALS — BP 122/78 | HR 93 | Temp 98.3°F | Resp 16 | Wt 271.2 lb

## 2021-01-30 DIAGNOSIS — R32 Unspecified urinary incontinence: Secondary | ICD-10-CM

## 2021-01-30 DIAGNOSIS — Z1159 Encounter for screening for other viral diseases: Secondary | ICD-10-CM | POA: Diagnosis not present

## 2021-01-30 DIAGNOSIS — R7989 Other specified abnormal findings of blood chemistry: Secondary | ICD-10-CM | POA: Diagnosis not present

## 2021-01-30 DIAGNOSIS — Z23 Encounter for immunization: Secondary | ICD-10-CM

## 2021-01-30 DIAGNOSIS — E782 Mixed hyperlipidemia: Secondary | ICD-10-CM

## 2021-01-30 DIAGNOSIS — I10 Essential (primary) hypertension: Secondary | ICD-10-CM | POA: Diagnosis not present

## 2021-01-30 DIAGNOSIS — F32A Depression, unspecified: Secondary | ICD-10-CM

## 2021-01-30 DIAGNOSIS — R739 Hyperglycemia, unspecified: Secondary | ICD-10-CM | POA: Diagnosis not present

## 2021-01-30 LAB — CBC
HCT: 36.5 % (ref 36.0–46.0)
Hemoglobin: 12.3 g/dL (ref 12.0–15.0)
MCHC: 33.8 g/dL (ref 30.0–36.0)
MCV: 90.1 fl (ref 78.0–100.0)
Platelets: 206 10*3/uL (ref 150.0–400.0)
RBC: 4.05 Mil/uL (ref 3.87–5.11)
RDW: 14.7 % (ref 11.5–15.5)
WBC: 5.6 10*3/uL (ref 4.0–10.5)

## 2021-01-30 LAB — COMPREHENSIVE METABOLIC PANEL
ALT: 11 U/L (ref 0–35)
AST: 18 U/L (ref 0–37)
Albumin: 3.9 g/dL (ref 3.5–5.2)
Alkaline Phosphatase: 101 U/L (ref 39–117)
BUN: 17 mg/dL (ref 6–23)
CO2: 30 mEq/L (ref 19–32)
Calcium: 10.7 mg/dL — ABNORMAL HIGH (ref 8.4–10.5)
Chloride: 102 mEq/L (ref 96–112)
Creatinine, Ser: 1.1 mg/dL (ref 0.40–1.20)
GFR: 52.53 mL/min — ABNORMAL LOW (ref >60.00)
Glucose, Bld: 85 mg/dL (ref 70–99)
Potassium: 4.1 mEq/L (ref 3.5–5.1)
Sodium: 139 mEq/L (ref 135–145)
Total Bilirubin: 0.6 mg/dL (ref 0.2–1.2)
Total Protein: 7.3 g/dL (ref 6.0–8.3)

## 2021-01-30 LAB — HEMOGLOBIN A1C: Hgb A1c MFr Bld: 5.6 % (ref 4.6–6.5)

## 2021-01-30 LAB — LIPID PANEL
Cholesterol: 176 mg/dL (ref 0–200)
HDL: 52.8 mg/dL (ref >39.00)
LDL Cholesterol: 114 mg/dL — ABNORMAL HIGH (ref 0–99)
NonHDL: 122.94
Total CHOL/HDL Ratio: 3
Triglycerides: 46 mg/dL (ref 0.0–149.0)
VLDL: 9.2 mg/dL (ref 0.0–40.0)

## 2021-01-30 LAB — TSH: TSH: 0.52 u[IU]/mL (ref 0.35–5.50)

## 2021-01-30 MED ORDER — SOLIFENACIN SUCCINATE 5 MG PO TABS
5.0000 mg | ORAL_TABLET | Freq: Every day | ORAL | 3 refills | Status: DC
Start: 2021-01-30 — End: 2021-08-04

## 2021-01-30 MED ORDER — SERTRALINE HCL 50 MG PO TABS: 150.0000 mg | ORAL_TABLET | Freq: Every day | ORAL | 1 refills | Status: DC

## 2021-01-30 NOTE — Patient Instructions (Addendum)
Shingrix is the new shingles shot, 2 shots over 2-6 months, confirm coverage with insurance and document, then can return here for shots with nurse appt or at pharmacy   Flu shot in one month, covid shot in 2 or 6 weeks   Paxlovid is the new COVID medication we can give you if you get COVID so make sure you test if you have symptoms because we have to treat by day 5 of symptoms for it to be effective. If you are positive let us know so we can treat. If a home test is negative and your symptoms are persistent get a PCR test. Can check testing locations at [email or web address] If you are positive we will make an appointment with [place] and we will send in Paxlovid if you would like it. Check with your pharmacy before we meet to confirm they have it in stock, if they do not then we can get the prescription at the [facility]

## 2021-01-30 NOTE — Assessment & Plan Note (Signed)
With frequency and urgency. Did not tolerate Oxybutynin due to dry mouth so will switch to [facility] and refer to Urogyn for further evaluation.

## 2021-01-30 NOTE — Assessment & Plan Note (Signed)
Well controlled, no changes to meds. Encouraged heart healthy diet such as the DASH diet and exercise as tolerated.  °

## 2021-01-30 NOTE — Assessment & Plan Note (Signed)
Encourage heart healthy diet such as MIND or DASH diet, increase exercise, avoid trans fats, simple carbohydrates and processed foods, consider a krill or fish or flaxseed oil cap daily.  °

## 2021-01-30 NOTE — Progress Notes (Signed)
Patient ID: Sydney Pace, female    DOB: Jun 03, 1955  Age: 65 y.o. MRN: [id number]    Subjective:   Chief Complaint  Patient presents with   Follow-up   Hypertension     Subjective  HPI Sydney Pace presents for office visit today for follow up on HTN and hyperlipidemia. She reports feeling depressed and isolated due to her limited mobility caused by her urinary frequency, urgency, and incontinence problems. Her urinary incontinence is slightly worsening. Denies CP/palp/SOB/HA/congestion/fevers or GI c/o. Taking meds as prescribed.   Review of Systems  Constitutional:  Negative for chills, fatigue and fever.  HENT:  Negative for congestion, rhinorrhea, sinus pressure, sinus pain, sore throat and trouble swallowing.   Eyes:  Negative for pain.  Respiratory:  Negative for cough and shortness of breath.   Cardiovascular:  Negative for chest pain, palpitations and leg swelling.  Gastrointestinal:  Negative for abdominal pain, blood in stool, diarrhea, nausea and vomiting.  Genitourinary:  Positive for enuresis, frequency and urgency. Negative for decreased urine volume, flank pain, vaginal bleeding and vaginal discharge.  Musculoskeletal:  Negative for back pain.  Neurological:  Negative for headaches.  Psychiatric/Behavioral:  Positive for dysphoric mood.    History Past Medical History:  Diagnosis Date   Allergy    Anemia    Arthritis    Arthritis 07/06/2017   Constipation    occ   Dehydration 06/09/2016   Depression    Esophageal reflux 08/08/2012   GERD (gastroesophageal reflux disease)    Hypertension    Inverted nipple    right nipple has always been inverted - per pt   MS (multiple sclerosis) ([place])    Neuromuscular disorder ([place])    MS   Other and unspecified hyperlipidemia 08/08/2012   Thyroid disease     She has a past surgical history that includes Tonsillectomy; TMJ Arthroscopy; Hysteroscopy (02, 04, 2008); ORIF wrist fracture (Right, 04/07/2018); Colonoscopy; and  Upper gastrointestinal endoscopy.   Her family history includes Diabetes in her father; Emphysema in her mother; Heart disease in her father, mother, and sister; Hypertension in her mother; Sleep apnea in her brother.She reports that she has never smoked. She has never used smokeless tobacco. She reports current alcohol use. She reports that she does not use drugs.  Current Outpatient Medications on File Prior to Visit  Medication Sig Dispense Refill   ALPRAZolam (XANAX) 0.25 MG tablet Take 1 tablet (0.25 mg total) by mouth 2 (two) times daily as needed for anxiety. 20 tablet 1   AZO-CRANBERRY PO Take by mouth.     brimonidine (ALPHAGAN) 0.2 % ophthalmic solution Place 1 drop into both eyes 2 (two) times daily.     buPROPion (WELLBUTRIN XL) 300 MG 24 hr tablet Take 1 tablet (300 mg total) by mouth daily. 90 tablet 1   Carboxymethylcellul-Glycerin (LUBRICATING EYE DROPS OP) Place 1 drop into both eyes daily as needed (dry eyes).     cetirizine (ZYRTEC) 10 MG tablet Take 10 mg by mouth every evening.      Cholecalciferol (DIALYVITE VITAMIN D 5000 PO) Take 4,000 Units by mouth daily.      fluticasone (FLONASE) 50 MCG/ACT nasal spray Place 2 sprays into both nostrils daily as needed for allergies or rhinitis. 48 g 1   furosemide (LASIX) 20 MG tablet Take 1 tablet (20 mg total) by mouth daily as needed. 90 tablet 1   gabapentin (NEURONTIN) 100 MG capsule TAKE 1 CAPSULE BY MOUTH THREE TIMES DAILY 90 capsule 0  lisinopril-hydrochlorothiazide (ZESTORETIC) 20-25 MG tablet Take 1 tablet by mouth daily. 90 tablet 0   methylphenidate (RITALIN) 20 MG tablet One po qAM and one po qNoon 60 tablet 0   Multiple Vitamin (MULTIVITAMIN) tablet Take 1 tablet by mouth daily.     naproxen sodium (ALEVE) 220 MG tablet Take 220 mg by mouth daily as needed (pain).     Probiotic Product (PROBIOTIC DAILY PO) Take by mouth.     triamcinolone (KENALOG) 0.1 % APPLY 1 APPLICATION OF CREAM TWICE DAILY 30 g 0   No current  facility-administered medications on file prior to visit.     Objective:  Objective  Physical Exam Constitutional:      General: She is not in acute distress.    Appearance: Normal appearance. She is not ill-appearing or toxic-appearing.  HENT:     Head: Normocephalic and atraumatic.     Right Ear: Tympanic membrane, ear canal and external ear normal.     Left Ear: Tympanic membrane, ear canal and external ear normal.     Nose: No congestion or rhinorrhea.  Eyes:     Extraocular Movements: Extraocular movements intact.     Pupils: Pupils are equal, round, and reactive to light.  Cardiovascular:     Rate and Rhythm: Normal rate and regular rhythm.     Pulses: Normal pulses.     Heart sounds: Normal heart sounds. No murmur heard. Pulmonary:     Effort: Pulmonary effort is normal. No respiratory distress.     Breath sounds: Normal breath sounds. No wheezing, rhonchi or rales.  Abdominal:     General: Bowel sounds are normal.     Palpations: Abdomen is soft. There is no mass.     Tenderness: no abdominal tenderness There is no guarding.     Hernia: No hernia is present.  Musculoskeletal:        General: Normal range of motion.     Cervical back: Normal range of motion and neck supple.  Skin:    General: Skin is warm and dry.  Neurological:     Mental Status: She is alert and oriented to person, place, and time.  Psychiatric:        Behavior: Behavior normal.   BP 122/78   Pulse 93   Temp 98.3 F (36.8 C)   Resp 16   Wt 271 lb 3.2 oz (123 kg)   LMP  (LMP Unknown)   SpO2 97%   BMI 43.12 kg/m  Wt Readings from Last 3 Encounters:  01/30/21 271 lb 3.2 oz (123 kg)  10/08/20 272 lb (123.4 kg)  09/02/20 269 lb 8 oz (122.2 kg)     Lab Results  Component Value Date   WBC 5.6 01/30/2021   HGB 12.3 01/30/2021   HCT 36.5 01/30/2021   PLT 206.0 01/30/2021   GLUCOSE 85 01/30/2021   CHOL 176 01/30/2021   TRIG 46.0 01/30/2021   HDL 52.80 01/30/2021   LDLCALC 114 (H)  01/30/2021   ALT 11 01/30/2021   AST 18 01/30/2021   NA 139 01/30/2021   K 4.1 01/30/2021   CL 102 01/30/2021   CREATININE 1.10 01/30/2021   BUN 17 01/30/2021   CO2 30 01/30/2021   TSH 0.52 01/30/2021   HGBA1C 5.6 01/30/2021   MICROALBUR 1.17 01/02/2009    MM 3D SCREEN BREAST BILATERAL  Result Date: 10/03/2020 CLINICAL DATA:  Screening. EXAM: DIGITAL SCREENING BILATERAL MAMMOGRAM WITH TOMOSYNTHESIS AND CAD TECHNIQUE: Bilateral screening digital craniocaudal and mediolateral oblique mammograms  were obtained. Bilateral screening digital breast tomosynthesis was performed. The images were evaluated with computer-aided detection. COMPARISON:  Previous exam(s). ACR Breast Density Category b: There are scattered areas of fibroglandular density. FINDINGS: There are no findings suspicious for malignancy. The images were evaluated with computer-aided detection. IMPRESSION: No mammographic evidence of malignancy. A result letter of this screening mammogram will be mailed directly to the patient. RECOMMENDATION: Screening mammogram in one year. (Code:SM-B-01Y) BI-RADS CATEGORY  1: Negative. Electronically Signed   By: Everlean Alstrom M.D.   On: 10/03/2020 14:06     Assessment & Plan:  Plan    Meds ordered this encounter  Medications   solifenacin (VESICARE) 5 MG tablet    Sig: Take 1 tablet (5 mg total) by mouth daily.    Dispense:  30 tablet    Refill:  3   sertraline (ZOLOFT) 50 MG tablet    Sig: Take 3 tablets (150 mg total) by mouth daily.    Dispense:  90 tablet    Refill:  1     Problem List Items Addressed This Visit     Essential hypertension    Well controlled, no changes to meds. Encouraged heart healthy diet such as the DASH diet and exercise as tolerated.       Relevant Orders   CBC (Completed)   Comprehensive metabolic panel (Completed)   TSH (Completed)   Depression    She notes her anhedonia is still a problem as she largely stays home alone due to her urinary  incontinence. Will increase her Sertraline to 150 mg daily and refer to urogyn      Relevant Medications   sertraline (ZOLOFT) 50 MG tablet   Hyperlipidemia, mixed    Encourage heart healthy diet such as MIND or DASH diet, increase exercise, avoid trans fats, simple carbohydrates and processed foods, consider a krill or fish or flaxseed oil cap daily.       Relevant Orders   Lipid panel (Completed)   High serum parathyroid hormone (PTH)   Hyperglycemia    hgba1c acceptable, minimize simple carbs. Increase exercise as tolerated.      Relevant Orders   Hemoglobin A1c (Completed)   Urinary incontinence - Primary    With frequency and urgency. Did not tolerate Oxybutynin due to dry mouth so will switch to [facility] and refer to Urogyn for further evaluation.       Relevant Medications   solifenacin (VESICARE) 5 MG tablet   Other Relevant Orders   Ambulatory referral to Urogynecology   Other Visit Diagnoses     Encounter for hepatitis C screening test for low risk patient       Relevant Orders   Hepatitis C Antibody   Need for pneumococcal vaccination       Relevant Orders   Pneumococcal conjugate vaccine 20-valent ([id number]) (Completed)       Follow-up: Return 1 mn nurse visit for hi dose flu shot, 3 mn f/u 3 mn CPE. I, Suezanne Jacquet, acting as a scribe for Penni Homans, MD, have documented all relevent documentation on behalf of Penni Homans, MD, as directed by Penni Homans, MD while in the presence of Penni Homans, MD.  I, [name], MD personally performed the services described in this documentation. All medical record entries made by the scribe were at my direction and in my presence. I have reviewed the chart and agree that the record reflects my personal performance and is accurate and complete

## 2021-01-30 NOTE — Assessment & Plan Note (Signed)
She notes her anhedonia is still a problem as she largely stays home alone due to her urinary incontinence. Will increase her Sertraline to 150 mg daily and refer to urogyn

## 2021-01-30 NOTE — Assessment & Plan Note (Signed)
hgba1c acceptable, minimize simple carbs. Increase exercise as tolerated.  

## 2021-01-31 LAB — HEPATITIS C ANTIBODY
Hepatitis C Ab: NONREACTIVE
SIGNAL TO CUT-OFF: 0.04 (ref <1.00)

## 2021-02-28 ENCOUNTER — Telehealth: Payer: Self-pay | Admitting: Family Medicine

## 2021-02-28 NOTE — Telephone Encounter (Signed)
Left message for patient to call back and schedule Medicare Annual Wellness Visit (AWV) in office.   If not able to come in office, please offer to do virtually or by telephone.  Left office number and my jabber [phone].  Last AWV:11/29/2017  Please schedule at anytime with Nurse Health Advisor.

## 2021-03-05 ENCOUNTER — Ambulatory Visit (INDEPENDENT_AMBULATORY_CARE_PROVIDER_SITE_OTHER): Payer: Medicare (Managed Care) | Admitting: *Deleted

## 2021-03-05 ENCOUNTER — Other Ambulatory Visit: Payer: Self-pay

## 2021-03-05 DIAGNOSIS — Z23 Encounter for immunization: Secondary | ICD-10-CM | POA: Diagnosis not present

## 2021-03-05 NOTE — Progress Notes (Signed)
Patient here for high dose flu vaccine.  Vaccine given in left deltoid and patient tolerated well.  

## 2021-03-25 ENCOUNTER — Other Ambulatory Visit: Payer: Self-pay | Admitting: Neurology

## 2021-03-25 ENCOUNTER — Encounter: Payer: Self-pay | Admitting: Family Medicine

## 2021-03-25 MED ORDER — METHYLPHENIDATE HCL 20 MG PO TABS: ORAL_TABLET | ORAL | 0 refills | Status: DC

## 2021-03-25 NOTE — Telephone Encounter (Signed)
Pt request refill for methylphenidate (RITALIN) 20 MG tablet at [place]

## 2021-04-10 ENCOUNTER — Ambulatory Visit: Payer: Medicare HMO | Admitting: Neurology

## 2021-05-12 ENCOUNTER — Other Ambulatory Visit: Payer: Self-pay | Admitting: Neurology

## 2021-05-12 ENCOUNTER — Other Ambulatory Visit: Payer: Self-pay | Admitting: Family Medicine

## 2021-05-12 MED ORDER — METHYLPHENIDATE HCL 20 MG PO TABS: ORAL_TABLET | ORAL | 0 refills | Status: DC

## 2021-05-12 NOTE — Telephone Encounter (Signed)
Pt is requesting a refill for methylphenidate (RITALIN) 20 MG tablet.  Pharmacy:  [facility] [phone]

## 2021-05-12 NOTE — Addendum Note (Signed)
Addended by: Wyvonnia Lora on: 05/12/2021 02:51 PM   Modules accepted: Orders

## 2021-05-21 DIAGNOSIS — Z0000 Encounter for general adult medical examination without abnormal findings: Secondary | ICD-10-CM | POA: Diagnosis not present

## 2021-05-26 ENCOUNTER — Other Ambulatory Visit: Payer: Self-pay | Admitting: Family Medicine

## 2021-06-19 ENCOUNTER — Encounter: Payer: Self-pay | Admitting: Neurology

## 2021-06-19 ENCOUNTER — Telehealth: Payer: Self-pay | Admitting: Neurology

## 2021-06-19 NOTE — Telephone Encounter (Signed)
Called pt to reschedule 3/28 appt, no answer and unable to LVM. Sent mychart message and cancellation letter asking pt to call us back and reschedule. Please offer 4/4 or 4/5 if she calls back.

## 2021-07-10 ENCOUNTER — Ambulatory Visit (INDEPENDENT_AMBULATORY_CARE_PROVIDER_SITE_OTHER): Payer: Medicare (Managed Care) | Admitting: Family Medicine

## 2021-07-10 ENCOUNTER — Other Ambulatory Visit (HOSPITAL_BASED_OUTPATIENT_CLINIC_OR_DEPARTMENT_OTHER): Payer: Self-pay

## 2021-07-10 ENCOUNTER — Ambulatory Visit: Payer: Medicare (Managed Care) | Attending: Internal Medicine

## 2021-07-10 VITALS — BP 141/71 | HR 85 | Temp 97.7°F | Resp 16 | Wt 273.6 lb

## 2021-07-10 DIAGNOSIS — I10 Essential (primary) hypertension: Secondary | ICD-10-CM

## 2021-07-10 DIAGNOSIS — R32 Unspecified urinary incontinence: Secondary | ICD-10-CM

## 2021-07-10 DIAGNOSIS — G35 Multiple sclerosis: Secondary | ICD-10-CM

## 2021-07-10 DIAGNOSIS — R29898 Other symptoms and signs involving the musculoskeletal system: Secondary | ICD-10-CM | POA: Diagnosis not present

## 2021-07-10 DIAGNOSIS — Z0000 Encounter for general adult medical examination without abnormal findings: Secondary | ICD-10-CM | POA: Diagnosis not present

## 2021-07-10 DIAGNOSIS — E669 Obesity, unspecified: Secondary | ICD-10-CM

## 2021-07-10 DIAGNOSIS — R739 Hyperglycemia, unspecified: Secondary | ICD-10-CM | POA: Diagnosis not present

## 2021-07-10 DIAGNOSIS — E782 Mixed hyperlipidemia: Secondary | ICD-10-CM | POA: Diagnosis not present

## 2021-07-10 DIAGNOSIS — K635 Polyp of colon: Secondary | ICD-10-CM

## 2021-07-10 DIAGNOSIS — Z1211 Encounter for screening for malignant neoplasm of colon: Secondary | ICD-10-CM | POA: Diagnosis not present

## 2021-07-10 DIAGNOSIS — Z6841 Body Mass Index (BMI) 40.0 and over, adult: Secondary | ICD-10-CM | POA: Diagnosis not present

## 2021-07-10 DIAGNOSIS — R7989 Other specified abnormal findings of blood chemistry: Secondary | ICD-10-CM

## 2021-07-10 DIAGNOSIS — Z23 Encounter for immunization: Secondary | ICD-10-CM

## 2021-07-10 LAB — CBC WITH DIFFERENTIAL/PLATELET
Basophils Absolute: 0 10*3/uL (ref 0.0–0.1)
Basophils Relative: 0.4 % (ref 0.0–3.0)
Eosinophils Absolute: 0.2 10*3/uL (ref 0.0–0.7)
Eosinophils Relative: 2.7 % (ref 0.0–5.0)
HCT: 38 % (ref 36.0–46.0)
Hemoglobin: 12.6 g/dL (ref 12.0–15.0)
Lymphocytes Relative: 26.6 % (ref 12.0–46.0)
Lymphs Abs: 1.6 10*3/uL (ref 0.7–4.0)
MCHC: 33.2 g/dL (ref 30.0–36.0)
MCV: 90.6 fl (ref 78.0–100.0)
Monocytes Absolute: 0.4 10*3/uL (ref 0.1–1.0)
Monocytes Relative: 5.9 % (ref 3.0–12.0)
Neutro Abs: 3.8 10*3/uL (ref 1.4–7.7)
Neutrophils Relative %: 64.4 % (ref 43.0–77.0)
Platelets: 233 10*3/uL (ref 150.0–400.0)
RBC: 4.19 Mil/uL (ref 3.87–5.11)
RDW: 15.5 % (ref 11.5–15.5)
WBC: 6 10*3/uL (ref 4.0–10.5)

## 2021-07-10 LAB — COMPREHENSIVE METABOLIC PANEL
ALT: 17 U/L (ref 0–35)
AST: 20 U/L (ref 0–37)
Albumin: 3.9 g/dL (ref 3.5–5.2)
Alkaline Phosphatase: 98 U/L (ref 39–117)
BUN: 16 mg/dL (ref 6–23)
CO2: 33 mEq/L — ABNORMAL HIGH (ref 19–32)
Calcium: 10.5 mg/dL (ref 8.4–10.5)
Chloride: 101 mEq/L (ref 96–112)
Creatinine, Ser: 0.97 mg/dL (ref 0.40–1.20)
GFR: 60.89 mL/min (ref >60.00)
Glucose, Bld: 87 mg/dL (ref 70–99)
Potassium: 4.3 mEq/L (ref 3.5–5.1)
Sodium: 139 mEq/L (ref 135–145)
Total Bilirubin: 0.6 mg/dL (ref 0.2–1.2)
Total Protein: 7.5 g/dL (ref 6.0–8.3)

## 2021-07-10 LAB — LIPID PANEL
Cholesterol: 164 mg/dL (ref 0–200)
HDL: 61.5 mg/dL (ref >39.00)
LDL Cholesterol: 93 mg/dL (ref 0–99)
NonHDL: 102.09
Total CHOL/HDL Ratio: 3
Triglycerides: 44 mg/dL (ref 0.0–149.0)
VLDL: 8.8 mg/dL (ref 0.0–40.0)

## 2021-07-10 LAB — VITAMIN D 25 HYDROXY (VIT D DEFICIENCY, FRACTURES): VITD: 52.29 ng/mL (ref 30.00–100.00)

## 2021-07-10 LAB — TSH: TSH: 0.51 u[IU]/mL (ref 0.35–5.50)

## 2021-07-10 LAB — HEMOGLOBIN A1C: Hgb A1c MFr Bld: 5.7 % (ref 4.6–6.5)

## 2021-07-10 NOTE — Assessment & Plan Note (Signed)
hgba1c acceptable, minimize simple carbs. Increase exercise as tolerated.  

## 2021-07-10 NOTE — Patient Instructions (Addendum)
Shingrix is the new shingles shot, 2 shots over 2-6 months, confirm coverage with insurance and document, then can return here for shots with nurse appt or at pharmacy   Consider extended release Ritalin or Adderall   Preventive Care 65 Years and Older, Female Preventive care refers to lifestyle choices and visits with your health care provider that can promote health and wellness. Preventive care visits are also called wellness exams. What can I expect for my preventive care visit? Counseling Your health care provider may ask you questions about your: Medical history, including: Past medical problems. Family medical history. Pregnancy and menstrual history. History of falls. Current health, including: Memory and ability to understand (cognition). Emotional well-being. Home life and relationship well-being. Sexual activity and sexual health. Lifestyle, including: Alcohol, nicotine or tobacco, and drug use. Access to firearms. Diet, exercise, and sleep habits. Work and work Statistician. Sunscreen use. Safety issues such as seatbelt and bike helmet use. Physical exam Your health care provider will check your: Height and weight. These may be used to calculate your BMI (body mass index). BMI is a measurement that tells if you are at a healthy weight. Waist circumference. This measures the distance around your waistline. This measurement also tells if you are at a healthy weight and may help predict your risk of certain diseases, such as type 2 diabetes and high blood pressure. Heart rate and blood pressure. Body temperature. Skin for abnormal spots. What immunizations do I need? Vaccines are usually given at various ages, according to a schedule. Your health care provider will recommend vaccines for you based on your age, medical history, and lifestyle or other factors, such as travel or where you work. What tests do I need? Screening Your health care provider may recommend  screening tests for certain conditions. This may include: Lipid and cholesterol levels. Hepatitis C test. Hepatitis B test. HIV (human immunodeficiency virus) test. STI (sexually transmitted infection) testing, if you are at risk. Lung cancer screening. Colorectal cancer screening. Diabetes screening. This is done by checking your blood sugar (glucose) after you have not eaten for a while (fasting). Mammogram. Talk with your health care provider about how often you should have regular mammograms. BRCA-related cancer screening. This may be done if you have a family history of breast, ovarian, tubal, or peritoneal cancers. Bone density scan. This is done to screen for osteoporosis. Talk with your health care provider about your test results, treatment options, and if necessary, the need for more tests. Follow these instructions at home: Eating and drinking  Eat a diet that includes fresh fruits and vegetables, whole grains, lean protein, and low-fat dairy products. Limit your intake of foods with high amounts of sugar, saturated fats, and salt. Take vitamin and mineral supplements as recommended by your health care provider. Do not drink alcohol if your health care provider tells you not to drink. If you drink alcohol: Limit how much you have to 0-1 drink a day. Know how much alcohol is in your drink. In the [place] one drink equals one 12 oz bottle of beer (355 mL), one 5 oz glass of wine (148 mL), or one 1 oz glass of hard liquor (44 mL). Lifestyle Brush your teeth every morning and night with fluoride toothpaste. Floss one time each day. Exercise for at least 30 minutes 5 or more days each week. Do not use any products that contain nicotine or tobacco. These products include cigarettes, chewing tobacco, and vaping devices, such as e-cigarettes. If you need  help quitting, ask your health care provider. Do not use drugs. If you are sexually active, practice safe sex. Use a condom or other  form of protection in order to prevent STIs. Take aspirin only as told by your health care provider. Make sure that you understand how much to take and what form to take. Work with your health care provider to find out whether it is safe and beneficial for you to take aspirin daily. Ask your health care provider if you need to take a cholesterol-lowering medicine (statin). Find healthy ways to manage stress, such as: Meditation, yoga, or listening to music. Journaling. Talking to a trusted person. Spending time with friends and family. Minimize exposure to UV radiation to reduce your risk of skin cancer. Safety Always wear your seat belt while driving or riding in a vehicle. Do not drive: If you have been drinking alcohol. Do not ride with someone who has been drinking. When you are tired or distracted. While texting. If you have been using any mind-altering substances or drugs. Wear a helmet and other protective equipment during sports activities. If you have firearms in your house, make sure you follow all gun safety procedures. What's next? Visit your health care provider once a year for an annual wellness visit. Ask your health care provider how often you should have your eyes and teeth checked. Stay up to date on all vaccines. This information is not intended to replace advice given to you by your health care provider. Make sure you discuss any questions you have with your health care provider. Document Revised: 11/13/2020 Document Reviewed: 11/13/2020 Elsevier Patient Education  [employer].

## 2021-07-10 NOTE — Assessment & Plan Note (Addendum)
Missed her appt with urogynecology we arranged. She is ready to go so referral replaced

## 2021-07-10 NOTE — Progress Notes (Signed)
° °  Covid-19 Vaccination Clinic  Name:  Madelynne Lasker    MRN: [id number] DOB: September 22, 1955  07/10/2021  Ms. Deike was observed post Covid-19 immunization for 15 minutes without incident. She was provided with Vaccine Information Sheet and instruction to access the V-Safe system.   Ms. Pribyl was instructed to call 911 with any severe reactions post vaccine: Difficulty breathing  Swelling of face and throat  A fast heartbeat  A bad rash all over body  Dizziness and weakness   Immunizations Administered     Name Date Dose VIS Date Route   Pfizer Covid-19 Vaccine Bivalent Booster 07/10/2021 12:04 PM 0.3 mL 01/29/2021 Intramuscular   Manufacturer: [place]   Lot: [id number]   [place]: [phone]

## 2021-07-11 ENCOUNTER — Other Ambulatory Visit (HOSPITAL_BASED_OUTPATIENT_CLINIC_OR_DEPARTMENT_OTHER): Payer: Self-pay

## 2021-07-11 LAB — PARATHYROID HORMONE, INTACT (NO CA): PTH: 44 pg/mL (ref 16–77)

## 2021-07-11 MED ORDER — PFIZER COVID-19 VAC BIVALENT 30 MCG/0.3ML IM SUSP
INTRAMUSCULAR | 0 refills | Status: DC
  Filled 2021-07-11: qty 0.3, 1d supply, fill #0

## 2021-07-14 DIAGNOSIS — R7989 Other specified abnormal findings of blood chemistry: Secondary | ICD-10-CM | POA: Insufficient documentation

## 2021-07-14 DIAGNOSIS — K635 Polyp of colon: Secondary | ICD-10-CM | POA: Insufficient documentation

## 2021-07-14 NOTE — Assessment & Plan Note (Signed)
Encouraged DASH or MIND diet, decrease po intake and increase exercise as tolerated. Needs 7-8 hours of sleep nightly. Avoid trans fats, eat small, frequent meals every 4-5 hours with lean proteins, complex carbs and healthy fats. Minimize simple carbs, high fat foods and processed foods 

## 2021-07-14 NOTE — Assessment & Plan Note (Signed)
She has noted some drooling from the left side of her mouth at times. No dysphagia or other new concerning symptoms.

## 2021-07-14 NOTE — Progress Notes (Signed)
Subjective:    Patient ID: Sydney Pace, female    DOB: May 15, 1956, 66 y.o.   MRN: [id number]  Chief Complaint  Patient presents with   Annual Exam    HPI Patient is in today for annual preventative exam and follow up on chronic medical concerns. She is struggling with fatigue, weakness and has noted some drooling for the left side of the mouth she thinks is related to her MS. She continues to follow with neurology. She continues to live alone and acknowledges feeling lonely but has good family support. No recent febrile illness or hospitalizations. She notes feeling weaker and unsteady lately. She tries to maintain a heart healthy diet but is not overly active. Denies CP/palp/SOB/HA/congestion/fevers/GI or GU c/o. Taking meds as prescribed  continues to follow with opthamology, Dr Derenda Mis.  Past Medical History:  Diagnosis Date   Allergy    Anemia    Arthritis    Arthritis 07/06/2017   Constipation    occ   Dehydration 06/09/2016   Depression    Esophageal reflux 08/08/2012   GERD (gastroesophageal reflux disease)    Hypertension    Inverted nipple    right nipple has always been inverted - per pt   MS (multiple sclerosis) ([place])    Neuromuscular disorder ([place])    MS   Other and unspecified hyperlipidemia 08/08/2012   Thyroid disease     Past Surgical History:  Procedure Laterality Date   COLONOSCOPY     HYSTEROSCOPY  02, 04, 2008   with D&C   ORIF WRIST FRACTURE Right 04/07/2018   Procedure: RIGHT WRIST OPEN REDUCTION INTERNAL FIXATION (ORIF);  Surgeon: Meredith Pel, MD;  Location: [place];  Service: Orthopedics;  Laterality: Right;   TMJ ARTHROSCOPY     TONSILLECTOMY     UPPER GASTROINTESTINAL ENDOSCOPY      Family History  Problem Relation Age of Onset   Heart disease Mother        pacemaker   Emphysema Mother    Hypertension Mother    Heart disease Father    Diabetes Father    Heart disease Sister        cad   Sleep apnea Brother    Colon cancer Neg  Hx    Esophageal cancer Neg Hx    Rectal cancer Neg Hx    Stomach cancer Neg Hx    Colon polyps Neg Hx     Social History   Socioeconomic History   Marital status: Single    Spouse name: Not on file   Number of children: Not on file   Years of education: Not on file   Highest education level: Not on file  Occupational History   Not on file  Tobacco Use   Smoking status: Never   Smokeless tobacco: Never  Vaping Use   Vaping Use: Never used  Substance and Sexual Activity   Alcohol use: Yes    Alcohol/week: 0.0 standard drinks    Comment: occassional   Drug use: No   Sexual activity: Never    Comment: 1st intercourse 42 yo-5 partners  Other Topics Concern   Not on file  Social History Narrative   Not on file   Social Determinants of Health   Financial Resource Strain: Not on file  Food Insecurity: Not on file  Transportation Needs: Not on file  Physical Activity: Not on file  Stress: Not on file  Social Connections: Not on file  Intimate Partner Violence: Not on file  Outpatient Medications Prior to Visit  Medication Sig Dispense Refill   ALPRAZolam (XANAX) 0.25 MG tablet Take 1 tablet (0.25 mg total) by mouth 2 (two) times daily as needed for anxiety. 20 tablet 1   AZO-CRANBERRY PO Take by mouth.     brimonidine (ALPHAGAN) 0.2 % ophthalmic solution Place 1 drop into both eyes 2 (two) times daily.     buPROPion (WELLBUTRIN XL) 300 MG 24 hr tablet Take 1 tablet (300 mg total) by mouth daily. 90 tablet 1   Carboxymethylcellul-Glycerin (LUBRICATING EYE DROPS OP) Place 1 drop into both eyes daily as needed (dry eyes).     cetirizine (ZYRTEC) 10 MG tablet Take 10 mg by mouth every evening.      Cholecalciferol (DIALYVITE VITAMIN D 5000 PO) Take 4,000 Units by mouth daily.      fluticasone (FLONASE) 50 MCG/ACT nasal spray Place 2 sprays into both nostrils daily as needed for allergies or rhinitis. 48 g 1   furosemide (LASIX) 20 MG tablet Take 1 tablet (20 mg total) by  mouth daily as needed. 90 tablet 1   gabapentin (NEURONTIN) 100 MG capsule TAKE 1 CAPSULE BY MOUTH THREE TIMES DAILY 90 capsule 0   lisinopril-hydrochlorothiazide (ZESTORETIC) 20-25 MG tablet Take 1 tablet by mouth once daily 90 tablet 0   methylphenidate (RITALIN) 20 MG tablet One po qAM and one po qNoon 60 tablet 0   Multiple Vitamin (MULTIVITAMIN) tablet Take 1 tablet by mouth daily.     naproxen sodium (ALEVE) 220 MG tablet Take 220 mg by mouth daily as needed (pain).     Probiotic Product (PROBIOTIC DAILY PO) Take by mouth.     sertraline (ZOLOFT) 50 MG tablet Take 3 tablets by mouth once daily 90 tablet 3   solifenacin (VESICARE) 5 MG tablet Take 1 tablet (5 mg total) by mouth daily. 30 tablet 3   triamcinolone (KENALOG) 0.1 % APPLY 1 APPLICATION OF CREAM TWICE DAILY 30 g 0   No facility-administered medications prior to visit.    Allergies  Allergen Reactions   Shellfish Allergy Hives, Shortness Of Breath, Itching, Swelling and Rash    Pt had previously carried an epi-pen and had a history of severe reaction to shrimp with breathing problems and swelling of lips and tongue     Review of Systems  Constitutional:  Negative for chills, fever and malaise/fatigue.  HENT:  Negative for congestion and hearing loss.   Eyes:  Negative for discharge.  Respiratory:  Negative for cough, sputum production and shortness of breath.   Cardiovascular:  Negative for chest pain, palpitations and leg swelling.  Gastrointestinal:  Negative for abdominal pain, blood in stool, constipation, diarrhea, heartburn, nausea and vomiting.  Genitourinary:  Negative for dysuria, frequency, hematuria and urgency.  Musculoskeletal:  Negative for back pain, falls and myalgias.  Skin:  Negative for rash.  Neurological:  Positive for weakness. Negative for dizziness, sensory change, loss of consciousness and headaches.  Endo/Heme/Allergies:  Negative for environmental allergies. Does not bruise/bleed easily.   Psychiatric/Behavioral:  Positive for depression. Negative for hallucinations, substance abuse and suicidal ideas. The patient is nervous/anxious. The patient does not have insomnia.       Objective:    Physical Exam Constitutional:      General: She is not in acute distress.    Appearance: She is well-developed.  HENT:     Head: Normocephalic and atraumatic.  Eyes:     Conjunctiva/sclera: Conjunctivae normal.  Neck:     Thyroid: No thyromegaly.  Cardiovascular:     Rate and Rhythm: Normal rate and regular rhythm.     Heart sounds: Normal heart sounds. No murmur heard. Pulmonary:     Effort: Pulmonary effort is normal. No respiratory distress.     Breath sounds: Normal breath sounds.  Abdominal:     General: Bowel sounds are normal. There is no distension.     Palpations: Abdomen is soft. There is no mass.     Tenderness: There is no abdominal tenderness.  Musculoskeletal:     Cervical back: Neck supple.  Lymphadenopathy:     Cervical: No cervical adenopathy.  Skin:    General: Skin is warm and dry.  Neurological:     Mental Status: She is alert and oriented to person, place, and time.  Psychiatric:        Behavior: Behavior normal.    BP (!) 141/71    Pulse 85    Temp 97.7 F (36.5 C)    Resp 16    Wt 273 lb 9.6 oz (124.1 kg)    LMP  (LMP Unknown)    SpO2 97%    BMI 43.50 kg/m  Wt Readings from Last 3 Encounters:  07/10/21 273 lb 9.6 oz (124.1 kg)  01/30/21 271 lb 3.2 oz (123 kg)  10/08/20 272 lb (123.4 kg)    Diabetic Foot Exam - Simple   No data filed    Lab Results  Component Value Date   WBC 6.0 07/10/2021   HGB 12.6 07/10/2021   HCT 38.0 07/10/2021   PLT 233.0 07/10/2021   GLUCOSE 87 07/10/2021   CHOL 164 07/10/2021   TRIG 44.0 07/10/2021   HDL 61.50 07/10/2021   LDLCALC 93 07/10/2021   ALT 17 07/10/2021   AST 20 07/10/2021   NA 139 07/10/2021   K 4.3 07/10/2021   CL 101 07/10/2021   CREATININE 0.97 07/10/2021   BUN 16 07/10/2021   CO2 33  (H) 07/10/2021   TSH 0.51 07/10/2021   HGBA1C 5.7 07/10/2021   MICROALBUR 1.17 01/02/2009    Lab Results  Component Value Date   TSH 0.51 07/10/2021   Lab Results  Component Value Date   WBC 6.0 07/10/2021   HGB 12.6 07/10/2021   HCT 38.0 07/10/2021   MCV 90.6 07/10/2021   PLT 233.0 07/10/2021   Lab Results  Component Value Date   NA 139 07/10/2021   K 4.3 07/10/2021   CO2 33 (H) 07/10/2021   GLUCOSE 87 07/10/2021   BUN 16 07/10/2021   CREATININE 0.97 07/10/2021   BILITOT 0.6 07/10/2021   ALKPHOS 98 07/10/2021   AST 20 07/10/2021   ALT 17 07/10/2021   PROT 7.5 07/10/2021   ALBUMIN 3.9 07/10/2021   CALCIUM 10.5 07/10/2021   ANIONGAP 11 04/07/2018   GFR 60.89 07/10/2021   Lab Results  Component Value Date   CHOL 164 07/10/2021   Lab Results  Component Value Date   HDL 61.50 07/10/2021   Lab Results  Component Value Date   LDLCALC 93 07/10/2021   Lab Results  Component Value Date   TRIG 44.0 07/10/2021   Lab Results  Component Value Date   CHOLHDL 3 07/10/2021   Lab Results  Component Value Date   HGBA1C 5.7 07/10/2021       Assessment & Plan:   Problem List Items Addressed This Visit     Multiple sclerosis ([place])    She has noted some drooling from the left side of her mouth at times.  No dysphagia or other new concerning symptoms.       Relevant Orders   Ambulatory referral to [place] hypertension    Well controlled, no changes to meds. Encouraged heart healthy diet such as the DASH diet and exercise as tolerated.       Relevant Orders   CBC with Differential/Platelet (Completed)   Comprehensive metabolic panel (Completed)   TSH (Completed)   Lipid panel (Completed)   Hyperlipidemia, mixed   Relevant Orders   CBC with Differential/Platelet (Completed)   Comprehensive metabolic panel (Completed)   TSH (Completed)   Lipid panel (Completed)   Preventative health care - Primary    Patient encouraged to maintain heart  healthy diet, regular exercise, adequate sleep. Consider daily probiotics. Take medications as prescribed. Labs ordered and reviewed. Referred for repeat colonoscopy. MM was May 2022, repeat this year. Last Dexa scan 07/2019. Repeat in 1-2 years. Discussed need for ACP documents.       Colon cancer screening    H/o polyp. Due for repeat colonoscopy, referral placed.       Obesity    Encouraged DASH or MIND diet, decrease po intake and increase exercise as tolerated. Needs 7-8 hours of sleep nightly. Avoid trans fats, eat small, frequent meals every 4-5 hours with lean proteins, complex carbs and healthy fats. Minimize simple carbs, high fat foods and processed foods      High serum parathyroid hormone (PTH)   Relevant Orders   PTH, intact (no Ca) (Completed)   Hyperglycemia    hgba1c acceptable, minimize simple carbs. Increase exercise as tolerated.       Relevant Orders   Hemoglobin A1c (Completed)   VITAMIN D 25 Hydroxy (Vit-D Deficiency, Fractures) (Completed)   Weakness of both lower extremities    Set up with home health for OT and PT evaluation      Relevant Orders   Ambulatory referral to [place]   Urinary incontinence    Missed her appt with urogynecology we arranged. She is ready to go so referral replaced      Relevant Orders   Ambulatory referral to Urogynecology   Low vitamin D level    Supplement and monitor      Relevant Orders   VITAMIN D 25 Hydroxy (Vit-D Deficiency, Fractures)   Polyp of colon   Relevant Orders   Ambulatory referral to Gastroenterology    I am having [name] maintain her multivitamin, cetirizine, furosemide, Cholecalciferol (DIALYVITE VITAMIN D 5000 PO), naproxen sodium, Carboxymethylcellul-Glycerin (LUBRICATING EYE DROPS OP), brimonidine, Probiotic Product (PROBIOTIC DAILY PO), AZO-CRANBERRY PO, gabapentin, fluticasone, ALPRAZolam, triamcinolone cream, buPROPion, solifenacin, methylphenidate, sertraline, and  lisinopril-hydrochlorothiazide.  No orders of the defined types were placed in this encounter.    Penni Homans, MD

## 2021-07-14 NOTE — Assessment & Plan Note (Signed)
Set up with home health for OT and PT evaluation

## 2021-07-14 NOTE — Assessment & Plan Note (Signed)
H/o polyp. Due for repeat colonoscopy, referral placed.

## 2021-07-14 NOTE — Assessment & Plan Note (Signed)
Well controlled, no changes to meds. Encouraged heart healthy diet such as the DASH diet and exercise as tolerated.  °

## 2021-07-14 NOTE — Assessment & Plan Note (Addendum)
Patient encouraged to maintain heart healthy diet, regular exercise, adequate sleep. Consider daily probiotics. Take medications as prescribed. Labs ordered and reviewed. Referred for repeat colonoscopy. MM was May 2022, repeat this year. Last Dexa scan 07/2019. Repeat in 1-2 years. Discussed need for ACP documents.

## 2021-07-14 NOTE — Assessment & Plan Note (Signed)
Supplement and monitor 

## 2021-07-17 ENCOUNTER — Other Ambulatory Visit: Payer: Self-pay | Admitting: Family Medicine

## 2021-07-17 DIAGNOSIS — G35 Multiple sclerosis: Secondary | ICD-10-CM

## 2021-07-17 DIAGNOSIS — M5416 Radiculopathy, lumbar region: Secondary | ICD-10-CM

## 2021-07-17 DIAGNOSIS — R29898 Other symptoms and signs involving the musculoskeletal system: Secondary | ICD-10-CM

## 2021-07-22 ENCOUNTER — Other Ambulatory Visit: Payer: Self-pay | Admitting: *Deleted

## 2021-07-22 DIAGNOSIS — M5416 Radiculopathy, lumbar region: Secondary | ICD-10-CM

## 2021-07-22 DIAGNOSIS — G35 Multiple sclerosis: Secondary | ICD-10-CM

## 2021-07-22 DIAGNOSIS — R29898 Other symptoms and signs involving the musculoskeletal system: Secondary | ICD-10-CM

## 2021-07-28 ENCOUNTER — Other Ambulatory Visit: Payer: Self-pay | Admitting: Neurology

## 2021-07-28 MED ORDER — METHYLPHENIDATE HCL 20 MG PO TABS: ORAL_TABLET | ORAL | 0 refills | Status: DC

## 2021-07-28 NOTE — Telephone Encounter (Signed)
Pt is requesting a refill for methylphenidate (RITALIN) 20 MG tablet.  Pharmacy: [facility] [phone]

## 2021-07-31 DIAGNOSIS — G4733 Obstructive sleep apnea (adult) (pediatric): Secondary | ICD-10-CM | POA: Diagnosis not present

## 2021-07-31 DIAGNOSIS — H40021 Open angle with borderline findings, high risk, right eye: Secondary | ICD-10-CM | POA: Diagnosis not present

## 2021-07-31 DIAGNOSIS — H2513 Age-related nuclear cataract, bilateral: Secondary | ICD-10-CM | POA: Diagnosis not present

## 2021-07-31 DIAGNOSIS — H47292 Other optic atrophy, left eye: Secondary | ICD-10-CM | POA: Diagnosis not present

## 2021-07-31 DIAGNOSIS — H401122 Primary open-angle glaucoma, left eye, moderate stage: Secondary | ICD-10-CM | POA: Diagnosis not present

## 2021-07-31 DIAGNOSIS — G35 Multiple sclerosis: Secondary | ICD-10-CM | POA: Diagnosis not present

## 2021-08-03 ENCOUNTER — Other Ambulatory Visit: Payer: Self-pay | Admitting: Family Medicine

## 2021-08-08 ENCOUNTER — Telehealth: Payer: Self-pay

## 2021-08-08 NOTE — Telephone Encounter (Signed)
I have left a voicemail for patient to call and schedule appointment with previsit for recall colonoscopy.  ?

## 2021-08-11 ENCOUNTER — Ambulatory Visit: Payer: Medicare (Managed Care)

## 2021-08-18 ENCOUNTER — Other Ambulatory Visit: Payer: Self-pay | Admitting: Family Medicine

## 2021-08-26 ENCOUNTER — Ambulatory Visit: Payer: Medicare (Managed Care) | Admitting: Neurology

## 2021-09-15 ENCOUNTER — Ambulatory Visit: Payer: Medicare (Managed Care) | Attending: Family Medicine

## 2021-09-15 DIAGNOSIS — M6281 Muscle weakness (generalized): Secondary | ICD-10-CM | POA: Diagnosis not present

## 2021-09-15 DIAGNOSIS — R2689 Other abnormalities of gait and mobility: Secondary | ICD-10-CM | POA: Diagnosis not present

## 2021-09-15 DIAGNOSIS — G35 Multiple sclerosis: Secondary | ICD-10-CM | POA: Insufficient documentation

## 2021-09-15 DIAGNOSIS — M79661 Pain in right lower leg: Secondary | ICD-10-CM | POA: Diagnosis not present

## 2021-09-15 DIAGNOSIS — R262 Difficulty in walking, not elsewhere classified: Secondary | ICD-10-CM | POA: Insufficient documentation

## 2021-09-15 DIAGNOSIS — M5416 Radiculopathy, lumbar region: Secondary | ICD-10-CM | POA: Insufficient documentation

## 2021-09-15 DIAGNOSIS — R29898 Other symptoms and signs involving the musculoskeletal system: Secondary | ICD-10-CM | POA: Insufficient documentation

## 2021-09-15 DIAGNOSIS — R2681 Unsteadiness on feet: Secondary | ICD-10-CM | POA: Diagnosis not present

## 2021-09-15 NOTE — Therapy (Signed)
[place] ?[facility] ?[address], [address] ?[place], [place], [address] ?Phone: [phone]   Fax:  [phone] ? ?Physical Therapy Evaluation ? ?Patient Details  ?Name: Sydney Pace ?MRN: [id number] ?Date of Birth: 04/04/1956 ?Referring Provider (PT): [name], MD ? ? ?Encounter Date: 09/15/2021 ? ? PT End of Session - 09/15/21 1020   ? ? Visit Number 1   ? Number of Visits 12   ? Date for PT Re-Evaluation 11/10/21   ? Authorization Type [employer]vantage 2023   ? Authorization Time Period after 25th visit   ? Authorization - Visit Number 1   ? Authorization - Number of Visits 25   ? Progress Note Due on Visit 10   ? PT Start Time 1021   ? PT Stop Time 1100   ? PT Time Calculation (min) 39 min   ? Activity Tolerance Patient tolerated treatment well   ? Behavior During Therapy [facility] for tasks assessed/performed   ? ?  ?  ? ?  ? ? ?Past Medical History:  ?Diagnosis Date  ? Allergy   ? Anemia   ? Arthritis   ? Arthritis 07/06/2017  ? Constipation   ? occ  ? Dehydration 06/09/2016  ? Depression   ? Esophageal reflux 08/08/2012  ? GERD (gastroesophageal reflux disease)   ? Hypertension   ? Inverted nipple   ? right nipple has always been inverted - per pt  ? MS (multiple sclerosis) ([place])   ? Neuromuscular disorder ([place])   ? MS  ? Other and unspecified hyperlipidemia 08/08/2012  ? Thyroid disease   ? ? ?Past Surgical History:  ?Procedure Laterality Date  ? COLONOSCOPY    ? HYSTEROSCOPY  02, 04, 2008  ? with D&C  ? ORIF WRIST FRACTURE Right 04/07/2018  ? Procedure: RIGHT WRIST OPEN REDUCTION INTERNAL FIXATION (ORIF);  Surgeon: Meredith Pel, MD;  Location: [place];  Service: Orthopedics;  Laterality: Right;  ? TMJ ARTHROSCOPY    ? TONSILLECTOMY    ? UPPER GASTROINTESTINAL ENDOSCOPY    ? ? ?There were no vitals filed for this visit. ? ? ? Subjective Assessment - 09/15/21 1023   ? ? Subjective Pt has had diagnosis of MS for years pt reports hx of chronic LBP denies any worsening of  symptoms with numbness and tingling. Pt notes increased difficulty with walking and notes increased incidents of LOB and falling. Chief complaint is lack of LE strength and balance/walking deficits   ? Currently in Pain? Yes   ? Pain Score 4    ? Pain Location Back   ? Pain Orientation Lower   ? Pain Descriptors / Indicators Sore   ? Pain Type Chronic pain   ? Pain Onset More than a month ago   ? Aggravating Factors  prolonged standing   ? Effect of Pain on Daily Activities pt reports self-limiting as these issues are chronic and not necessarily acute/relevant   ? ?  ?  ? ?  ? ? ? ? ? [facility] PT Assessment - 09/15/21 0001   ? ?  ? Assessment  ? Medical Diagnosis MS, lumbar radiculopthy   ? Referring Provider (PT) [name], MD   ? Prior Therapy previous PT   ?  ? Balance Screen  ? Has the patient fallen in the past 6 months Yes   ? How many times? 2   ? Has the patient had a decrease in activity level because of a  fear of falling?  Yes   ? Is the patient reluctant to leave their home because of a fear of falling?  Yes   ?  ? Home Environment  ? Living Environment Private residence   ? Living Arrangements Alone   ? Available Help at Discharge Family   ? Type of Home House   ? Home Access Ramped entrance   ? Home Layout One level   ? [place] - 2 wheels;Cane - quad;Grab bars - toilet   ?  ? Prior Function  ? Level of Independence Independent with household mobility with device;Independent with community mobility with device   ? Leisure used to go to [employer]   ?  ? Coordination  ? Gross Motor Movements are Fluid and Coordinated Yes   ?  ? Functional Tests  ? Functional tests Sit to Stand   ?  ? Sit to Stand  ? Comments heavy reliance on BUE   ?  ? Posture/Postural Control  ? Posture/Postural Control No significant limitations   ?  ? ROM / Strength  ? AROM / PROM / Strength AROM;Strength   ?  ? AROM  ? Overall AROM  Within functional limits for tasks performed   ?  ? Strength  ? Overall Strength Comments  gross assessment performed sitting. Pronounced knee crepitus with knee extension   ? Strength Assessment Site Hip;Knee;Ankle   ? Right/Left Hip Right;Left   ? Right Hip Flexion 3/5   ? Right Hip ABduction 3/5   ? Right Hip ADduction 3/5   ? Left Hip Flexion 3/5   ? Left Hip ABduction 3/5   ? Left Hip ADduction 3/5   ? Right/Left Knee Right;Left   ? Right Knee Flexion 4/5   ? Right Knee Extension 3+/5   ? Left Knee Flexion 4/5   ? Left Knee Extension 3+/5   ? Right/Left Ankle Right;Left   ? Right Ankle Dorsiflexion 4/5   ? Right Ankle Plantar Flexion 4-/5   ? Left Ankle Dorsiflexion 4/5   ? Left Ankle Plantar Flexion 4-/5   ?  ? Ambulation/Gait  ? Ambulation/Gait Yes   ? Ambulation/Gait Assistance 6: Modified independent (Device/Increase time)   ? Assistive device Large base quad cane   ? Gait Pattern Decreased stride length;Step-to pattern   ? Gait velocity 1.36 ft/sec   ?  ? Standardized Balance Assessment  ? Standardized Balance Assessment Timed Up and Go Test   ?  ? Timed Up and Go Test  ? Normal TUG (seconds) 34.13   ? ?  ?  ? ?  ? ? ? ? ? ? ? ? ? ? ? ? ? ?Objective measurements completed on examination: See above findings.  ? ? ? ? ? [facility] Adult PT Treatment/Exercise - 09/15/21 0001   ? ?  ? Ambulation/Gait  ? Gait Comments gait training with [facility] and [facility] to improve loading response   ? ?  ?  ? ?  ? ? ? ? ? ? ? ? ? ? PT Education - 09/15/21 1216   ? ? Education Details discussion regarding assessment findings and relevant interventions   ? Person(s) Educated Patient   ? Methods Explanation   ? Comprehension Verbalized understanding   ? ?  ?  ? ?  ? ? ? PT Short Term Goals - 09/15/21 1227   ? ?  ? PT SHORT TERM GOAL #1  ? Title be independent in initial HEP   ? Time 3   ?  Period Weeks   ? Status New   ? Target Date 10/06/21   ?  ? PT SHORT TERM GOAL #2  ? Title Reduce risk for falls as evidenced by time of 28 sec TUG test   ? Baseline 34.13 sec with [place]   ? Time 3   ? Period Weeks   ? Status New   ? Target Date  10/06/21   ?  ? PT SHORT TERM GOAL #3  ? Title improve LE strength to perform sit to stand with single UE support   ? Baseline heavy reliance BUE   ? Time 3   ? Period Weeks   ? Status New   ? Target Date 10/06/21   ? ?  ?  ? ?  ? ? ? ? PT Long Term Goals - 09/15/21 1229   ? ?  ? PT LONG TERM GOAL #1  ? Title be independent in advanced HEP   ? Time 6   ? Period Weeks   ? Status New   ? Target Date 11/10/21   ?  ? PT LONG TERM GOAL #2  ? Title Demo improved gait tolerance and speed for community level ambulation to 1.8 ft/sec   ? Baseline 1.3 ft/sec   ? Time 6   ? Period Weeks   ? Status New   ? Target Date 11/10/21   ? ?  ?  ? ?  ? ? ? ? ? ? ? ? ? Plan - 09/15/21 1223   ? ? Clinical Impression Statement Pt is 66 yo lady with hx of chronic LBP and hx of MS who presents to clinic with generalized weakness, balance deficits, high risk for falls per TUG test, gait deviations,improper use of AD,  and reduced functional activity tolerance indicating need for PT services to address deficits and limitations to enable greater mobility and reduce risk for falls and injury.   ? Personal Factors and Comorbidities Age;Time since onset of injury/illness/exacerbation;Past/Current Experience   ? Examination-Activity Limitations Lift;Stand;Stairs;Squat;Locomotion Level;Transfers   ? Examination-Participation Restrictions Cleaning;Community Activity;Yard Work;Meal Prep   ? Stability/Clinical Decision Making Evolving/Moderate complexity   ? Clinical Decision Making Moderate   ? Rehab Potential Good   ? PT Frequency 2x / week   ? PT Duration 6 weeks   ? PT Treatment/Interventions ADLs/Self Care Home Management;Aquatic Therapy;Electrical Stimulation;DME Instruction;Gait training;Stair training;Functional mobility training;Therapeutic activities;Therapeutic exercise;Balance training;Neuromuscular re-education;Manual techniques;Patient/family education;Dry needling;Energy conservation;Taping   ? PT Next Visit Plan develop general HEP  activities   ? PT Home Exercise Plan proper use of AD   ? Consulted and Agree with Plan of Care Patient   ? ?  ?  ? ?  ? ? ?Patient will benefit from skilled therapeutic intervention in order to improve the f

## 2021-09-18 ENCOUNTER — Ambulatory Visit: Payer: Medicare (Managed Care)

## 2021-09-18 DIAGNOSIS — R2689 Other abnormalities of gait and mobility: Secondary | ICD-10-CM

## 2021-09-18 DIAGNOSIS — R2681 Unsteadiness on feet: Secondary | ICD-10-CM | POA: Diagnosis not present

## 2021-09-18 DIAGNOSIS — R262 Difficulty in walking, not elsewhere classified: Secondary | ICD-10-CM

## 2021-09-18 DIAGNOSIS — M6281 Muscle weakness (generalized): Secondary | ICD-10-CM

## 2021-09-18 NOTE — Therapy (Signed)
[place] ?[facility] ?[address], [address] ?[place], [place], [address] ?Phone: [phone]   Fax:  [phone] ? ?Physical Therapy Treatment ? ?Patient Details  ?Name: Sydney Pace ?MRN: [id number] ?Date of Birth: [birth year] ?Referring Provider (PT): [name], MD ? ? ?Encounter Date: 09/18/2021 ? ? PT End of Session - 09/18/21 0940   ? ? Visit Number 2   ? Number of Visits 12   ? Date for PT Re-Evaluation 11/10/21   ? Authorization Type [employer]vantage 2023   ? Authorization Time Period after 25th visit   ? Authorization - Visit Number 2   ? Authorization - Number of Visits 25   ? Progress Note Due on Visit 10   ? PT Start Time 6503   ? PT Stop Time 1015   ? PT Time Calculation (min) 43 min   ? Activity Tolerance Patient tolerated treatment well   ? Behavior During Therapy [facility] for tasks assessed/performed   ? ?  ?  ? ?  ? ? ?Past Medical History:  ?Diagnosis Date  ? Allergy   ? Anemia   ? Arthritis   ? Arthritis 07/06/2017  ? Constipation   ? occ  ? Dehydration 06/09/2016  ? Depression   ? Esophageal reflux 08/08/2012  ? GERD (gastroesophageal reflux disease)   ? Hypertension   ? Inverted nipple   ? right nipple has always been inverted - per pt  ? MS (multiple sclerosis) ([place])   ? Neuromuscular disorder ([place])   ? MS  ? Other and unspecified hyperlipidemia 08/08/2012  ? Thyroid disease   ? ? ?Past Surgical History:  ?Procedure Laterality Date  ? COLONOSCOPY    ? HYSTEROSCOPY  02, 04, 2008  ? with D&C  ? ORIF WRIST FRACTURE Right 04/07/2018  ? Procedure: RIGHT WRIST OPEN REDUCTION INTERNAL FIXATION (ORIF);  Surgeon: Meredith Pel, MD;  Location: [facility];  Service: Orthopedics;  Laterality: Right;  ? TMJ ARTHROSCOPY    ? TONSILLECTOMY    ? UPPER GASTROINTESTINAL ENDOSCOPY    ? ? ?There were no vitals filed for this visit. ? ? Subjective Assessment - 09/18/21 0941   ? ? Subjective Right lateral knee discomfot and grinding in knees limits alot of activity   ? Pain Onset More  than a month ago   ? ?  ?  ? ?  ? ? ? ? ? ? ? ? ? ? ? ? ? ? ? ? ? ? ? ? [place] Adult PT Treatment/Exercise - 09/18/21 0001   ? ?  ? Exercises  ? Exercises Knee/Hip   ?  ? Knee/Hip Exercises: Standing  ? Heel Raises Both;2 sets;10 reps   ? Heel Raises Limitations counter top support   ? Other Standing Knee Exercises sidestepping with counter x 60 sec   ?  ? Knee/Hip Exercises: Seated  ? Clamshell with TheraBand Red   3x10  ? Sit to Sand 3 sets;with UE support   3 reps with red tloop  ? ?  ?  ? ?  ? ? ? ? ? ? ? ? ? ? PT Education - 09/18/21 1012   ? ? Education Details HEP initiation   ? Person(s) Educated Patient   ? Methods Explanation   ? Comprehension Verbalized understanding   ? ?  ?  ? ?  ? ? ? PT Short Term Goals - 09/15/21 1227   ? ?  ? PT SHORT TERM GOAL #1  ?  Title be independent in initial HEP   ? Time 3   ? Period Weeks   ? Status New   ? Target Date 10/06/21   ?  ? PT SHORT TERM GOAL #2  ? Title Reduce risk for falls as evidenced by time of 28 sec TUG test   ? Baseline 34.13 sec with [facility]   ? Time 3   ? Period Weeks   ? Status New   ? Target Date 10/06/21   ?  ? PT SHORT TERM GOAL #3  ? Title improve LE strength to perform sit to stand with single UE support   ? Baseline heavy reliance BUE   ? Time 3   ? Period Weeks   ? Status New   ? Target Date 10/06/21   ? ?  ?  ? ?  ? ? ? ? PT Long Term Goals - 09/15/21 1229   ? ?  ? PT LONG TERM GOAL #1  ? Title be independent in advanced HEP   ? Time 6   ? Period Weeks   ? Status New   ? Target Date 11/10/21   ?  ? PT LONG TERM GOAL #2  ? Title Demo improved gait tolerance and speed for community level ambulation to 1.8 ft/sec   ? Baseline 1.3 ft/sec   ? Time 6   ? Period Weeks   ? Status New   ? Target Date 11/10/21   ? ?  ?  ? ?  ? ? ? ? ? ? ? ? Plan - 09/18/21 1013   ? ? Clinical Impression Statement Pronounced knee crepitus and valgus deformity present bilateral knees. Emphasis on initiating HEP for improved BLE strength and techniques to promotoe hip abduction  recruitment to improve distal stabilization. Pt educated on exercise prescription and focus on once/daily exercise but initiiating at every other day basis for rest of this week to gauge reaction. Continued sessions to progress LE strength and motor control   ? Personal Factors and Comorbidities Age;Time since onset of injury/illness/exacerbation;Past/Current Experience   ? Examination-Activity Limitations Lift;Stand;Stairs;Squat;Locomotion Level;Transfers   ? Examination-Participation Restrictions Cleaning;Community Activity;Yard Work;Meal Prep   ? Stability/Clinical Decision Making Evolving/Moderate complexity   ? Rehab Potential Good   ? PT Frequency 2x / week   ? PT Duration 6 weeks   ? PT Treatment/Interventions ADLs/Self Care Home Management;Aquatic Therapy;Electrical Stimulation;DME Instruction;Gait training;Stair training;Functional mobility training;Therapeutic activities;Therapeutic exercise;Balance training;Neuromuscular re-education;Manual techniques;Patient/family education;Dry needling;Energy conservation;Taping   ? PT Next Visit Plan develop general HEP activities   ? PT Home Exercise Plan proper use of AD   ? Consulted and Agree with Plan of Care Patient   ? ?  ?  ? ?  ? ? ?Patient will benefit from skilled therapeutic intervention in order to improve the following deficits and impairments:  Abnormal gait, Decreased activity tolerance, Decreased balance, Decreased endurance, Decreased knowledge of use of DME, Decreased mobility, Decreased strength, Difficulty walking, Improper body mechanics, Pain, Obesity ? ?Visit Diagnosis: ?Unsteadiness on feet ? ?Muscle weakness (generalized) ? ?Difficulty in walking, not elsewhere classified ? ?Other abnormalities of gait and mobility ? ? ? ? ?Problem List ?Patient Active Problem List  ? Diagnosis Date Noted  ? Low vitamin D level 07/14/2021  ? Polyp of colon 07/14/2021  ? Urinary incontinence 01/30/2021  ? Restless leg syndrome 08/03/2019  ? Attention deficit  disorder (ADD) in adult 08/03/2019  ? Weakness of both lower extremities 07/26/2019  ? Left shoulder pain 07/26/2019  ?  Hyperglycemia 07/25/2019  ? Shingles 10/09/2018  ? Lumbar radiculopathy 08/02/2018  ? Numbness 08/02/2018  ? Post-menopausal bleeding 03/17/2018  ? Dysuria 03/17/2018  ? High serum parathyroid hormone (PTH) 01/10/2018  ? Hypercalcemia 01/10/2018  ? Chronic heel pain, left 07/06/2017  ? Arthritis 07/06/2017  ? Osteopenia 07/06/2017  ? Hx of colonic polyp 07/06/2017  ? Cervical cancer screening 07/06/2017  ? Obesity 07/06/2017  ? Dry eyes, bilateral 10/10/2015  ? Low-tension glaucoma of both eyes, moderate stage 10/10/2015  ? Nuclear sclerotic cataract of both eyes 10/10/2015  ? Partial optic atrophy of left eye 10/10/2015  ? Insomnia 08/16/2014  ? Optic neuritis 08/16/2014  ? Ataxic gait 08/16/2014  ? Other fatigue 08/16/2014  ? Abdominal pain 04/03/2014  ? Colon cancer screening 04/03/2014  ? Helicobacter pylori (H. pylori) infection 03/21/2014  ? Preventative health care 08/27/2013  ? Sinusitis 08/08/2012  ? Hyperlipidemia, mixed 08/08/2012  ? Allergy 08/08/2012  ? Esophageal reflux 08/08/2012  ? Depression 01/23/2011  ? Leg swelling 01/23/2011  ? Multiple sclerosis ([place]) 09/23/2010  ? Essential hypertension 09/23/2010  ? Overactive bladder 09/23/2010  ? Thyroid nodule 09/23/2010  ? Pulmonary nodule 09/23/2010  ? Fatigue 09/23/2010  ? ? ?Toniann Fail, PT ?09/18/2021, 10:16 AM ? ?[place] ?[facility] ?[address], [address] ?[place], [place], [address] ?Phone: [phone]   Fax:  [phone] ? ?Name: Sydney Pace ?MRN: [id number] ?Date of Birth: 31-May-1956 ? ? ? ?

## 2021-09-18 NOTE — Patient Instructions (Signed)
Access Code: [email or web address] ?URL: [email or web address] ?Date: 09/18/2021 ?Prepared by: Sherlyn Lees ? ?Exercises ?- Heel Raises with Counter Support  - 1 x daily - 7 x weekly - 2-3 sets - 10 reps ?- Side Stepping with Counter Support  - 1 x daily - 7 x weekly - 1-3 sets - 30-60 sec hold ?- Sit to Stand with Counter Support  - 1 x daily - 7 x weekly - 3 sets - 3-5 reps ?- Seated Hip Abduction with Resistance  - 1 x daily - 7 x weekly - 3 sets - 10 reps ?

## 2021-09-19 ENCOUNTER — Telehealth: Payer: Self-pay

## 2021-09-19 MED ORDER — SERTRALINE HCL 100 MG PO TABS: 150.0000 mg | ORAL_TABLET | Freq: Every day | ORAL | 1 refills | Status: DC

## 2021-09-19 NOTE — Telephone Encounter (Signed)
Pt's insurance does not want to cover sertraline '50mg'$  3 tablets daily, okay to change to sertraline '100mg'$  1.5 tablets daily?  ?

## 2021-09-22 ENCOUNTER — Other Ambulatory Visit: Payer: Self-pay | Admitting: Neurology

## 2021-09-22 ENCOUNTER — Ambulatory Visit: Payer: Medicare (Managed Care)

## 2021-09-22 DIAGNOSIS — R262 Difficulty in walking, not elsewhere classified: Secondary | ICD-10-CM

## 2021-09-22 DIAGNOSIS — R2681 Unsteadiness on feet: Secondary | ICD-10-CM

## 2021-09-22 DIAGNOSIS — R2689 Other abnormalities of gait and mobility: Secondary | ICD-10-CM

## 2021-09-22 DIAGNOSIS — M6281 Muscle weakness (generalized): Secondary | ICD-10-CM

## 2021-09-22 MED ORDER — METHYLPHENIDATE HCL 20 MG PO TABS: ORAL_TABLET | ORAL | 0 refills | Status: DC

## 2021-09-22 NOTE — Therapy (Signed)
[place] ?[facility] ?[address], [address] ?[place], [place], [address] ?Phone: [phone]   Fax:  [phone] ? ?Physical Therapy Treatment ? ?Patient Details  ?Name: Sydney Pace ?MRN: [id number] ?Date of Birth: [birth year] ?Referring Provider (PT): [name], MD ? ? ?Encounter Date: 09/22/2021 ? ? PT End of Session - 09/22/21 0932   ? ? Visit Number 3   ? Number of Visits 12   ? Date for PT Re-Evaluation 11/10/21   ? Authorization Type [employer] ADvantage 2023   ? Authorization Time Period after 25th visit   ? Authorization - Visit Number 3   ? Authorization - Number of Visits 25   ? Progress Note Due on Visit 10   ? PT Start Time 0930   ? PT Stop Time 1015   ? PT Time Calculation (min) 45 min   ? Activity Tolerance Patient tolerated treatment well   ? Behavior During Therapy [facility] for tasks assessed/performed   ? ?  ?  ? ?  ? ? ?Past Medical History:  ?Diagnosis Date  ? Allergy   ? Anemia   ? Arthritis   ? Arthritis 07/06/2017  ? Constipation   ? occ  ? Dehydration 06/09/2016  ? Depression   ? Esophageal reflux 08/08/2012  ? GERD (gastroesophageal reflux disease)   ? Hypertension   ? Inverted nipple   ? right nipple has always been inverted - per pt  ? MS (multiple sclerosis) ([place])   ? Neuromuscular disorder ([place])   ? MS  ? Other and unspecified hyperlipidemia 08/08/2012  ? Thyroid disease   ? ? ?Past Surgical History:  ?Procedure Laterality Date  ? COLONOSCOPY    ? HYSTEROSCOPY  02, 04, 2008  ? with D&C  ? ORIF WRIST FRACTURE Right 04/07/2018  ? Procedure: RIGHT WRIST OPEN REDUCTION INTERNAL FIXATION (ORIF);  Surgeon: Meredith Pel, MD;  Location: [place];  Service: Orthopedics;  Laterality: Right;  ? TMJ ARTHROSCOPY    ? TONSILLECTOMY    ? UPPER GASTROINTESTINAL ENDOSCOPY    ? ? ?There were no vitals filed for this visit. ? ? Subjective Assessment - 09/22/21 0956   ? ? Subjective Did not do much over the weekend. No new issue. Trying HEP every other day   ? Pain Onset More  than a month ago   ? ?  ?  ? ?  ? ? ? ? ? ? ? ? ? ? ? ? ? ? ? ? ? ? ? ? [place] Adult PT Treatment/Exercise - 09/22/21 0001   ? ?  ? Knee/Hip Exercises: Aerobic  ? Other Aerobic UBE x 4 min level 1 for activity tolerance   ?  ? Knee/Hip Exercises: Standing  ? Heel Raises Both;2 sets;10 reps   ? Heel Raises Limitations counter top support   ? Other Standing Knee Exercises sidestepping with counter 2 x 60 sec   ?  ? Knee/Hip Exercises: Seated  ? Clamshell with TheraBand Green   3x10  ? Sit to Sand 3 sets;5 reps;with UE support   with green band  ? ?  ?  ? ?  ? ? ? ? ? ? Balance Exercises - 09/22/21 0001   ? ?  ? Balance Exercises: Standing  ? Standing Eyes Opened Narrow base of support (BOS);Wide (BOA);Foam/compliant surface;2 reps;Other (comment)   x 60 sec eyes open,  ? Standing Eyes Closed Foam/compliant surface;2 reps;30 secs   ? Tandem Stance Eyes open;Intermittent upper  extremity support;2 reps;30 secs   increased discomfort with RLE in biased position causing lateral compartment pain  ? ?  ?  ? ?  ? ? ? ? ? ? ? PT Short Term Goals - 09/15/21 1227   ? ?  ? PT SHORT TERM GOAL #1  ? Title be independent in initial HEP   ? Time 3   ? Period Weeks   ? Status New   ? Target Date 10/06/21   ?  ? PT SHORT TERM GOAL #2  ? Title Reduce risk for falls as evidenced by time of 28 sec TUG test   ? Baseline 34.13 sec with [facility]   ? Time 3   ? Period Weeks   ? Status New   ? Target Date 10/06/21   ?  ? PT SHORT TERM GOAL #3  ? Title improve LE strength to perform sit to stand with single UE support   ? Baseline heavy reliance BUE   ? Time 3   ? Period Weeks   ? Status New   ? Target Date 10/06/21   ? ?  ?  ? ?  ? ? ? ? PT Long Term Goals - 09/15/21 1229   ? ?  ? PT LONG TERM GOAL #1  ? Title be independent in advanced HEP   ? Time 6   ? Period Weeks   ? Status New   ? Target Date 11/10/21   ?  ? PT LONG TERM GOAL #2  ? Title Demo improved gait tolerance and speed for community level ambulation to 1.8 ft/sec   ? Baseline 1.3 ft/sec    ? Time 6   ? Period Weeks   ? Status New   ? Target Date 11/10/21   ? ?  ?  ? ?  ? ? ? ? ? ? ? ? Plan - 09/22/21 1011   ? ? Clinical Impression Statement Tolerating session well without adverse effect other than right lateral knee/compartment pain with crepitus and instability appreciated and worse discomfort when RLE is in Peridot biased position. Discussed benefits of stability bracing for knee pain/instability. Continued POC indicated to progress LE strength, balance, and coordination to increase gait speed and reduce risk for falls   ? Personal Factors and Comorbidities Age;Time since onset of injury/illness/exacerbation;Past/Current Experience   ? Examination-Activity Limitations Lift;Stand;Stairs;Squat;Locomotion Level;Transfers   ? Examination-Participation Restrictions Cleaning;Community Activity;Yard Work;Meal Prep   ? Stability/Clinical Decision Making Evolving/Moderate complexity   ? Rehab Potential Good   ? PT Frequency 2x / week   ? PT Duration 6 weeks   ? PT Treatment/Interventions ADLs/Self Care Home Management;Aquatic Therapy;Electrical Stimulation;DME Instruction;Gait training;Stair training;Functional mobility training;Therapeutic activities;Therapeutic exercise;Balance training;Neuromuscular re-education;Manual techniques;Patient/family education;Dry needling;Energy conservation;Taping   ? PT Next Visit Plan develop general HEP activities   ? PT Home Exercise Plan proper use of AD   ? Consulted and Agree with Plan of Care Patient   ? ?  ?  ? ?  ? ? ?Patient will benefit from skilled therapeutic intervention in order to improve the following deficits and impairments:  Abnormal gait, Decreased activity tolerance, Decreased balance, Decreased endurance, Decreased knowledge of use of DME, Decreased mobility, Decreased strength, Difficulty walking, Improper body mechanics, Pain, Obesity ? ?Visit Diagnosis: ?Unsteadiness on feet ? ?Muscle weakness (generalized) ? ?Difficulty in walking, not elsewhere  classified ? ?Other abnormalities of gait and mobility ? ? ? ? ?Problem List ?Patient Active Problem List  ? Diagnosis Date Noted  ? Low vitamin  D level 07/14/2021  ? Polyp of colon 07/14/2021  ? Urinary incontinence 01/30/2021  ? Restless leg syndrome 08/03/2019  ? Attention deficit disorder (ADD) in adult 08/03/2019  ? Weakness of both lower extremities 07/26/2019  ? Left shoulder pain 07/26/2019  ? Hyperglycemia 07/25/2019  ? Shingles 10/09/2018  ? Lumbar radiculopathy 08/02/2018  ? Numbness 08/02/2018  ? Post-menopausal bleeding 03/17/2018  ? Dysuria 03/17/2018  ? High serum parathyroid hormone (PTH) 01/10/2018  ? Hypercalcemia 01/10/2018  ? Chronic heel pain, left 07/06/2017  ? Arthritis 07/06/2017  ? Osteopenia 07/06/2017  ? Hx of colonic polyp 07/06/2017  ? Cervical cancer screening 07/06/2017  ? Obesity 07/06/2017  ? Dry eyes, bilateral 10/10/2015  ? Low-tension glaucoma of both eyes, moderate stage 10/10/2015  ? Nuclear sclerotic cataract of both eyes 10/10/2015  ? Partial optic atrophy of left eye 10/10/2015  ? Insomnia 08/16/2014  ? Optic neuritis 08/16/2014  ? Ataxic gait 08/16/2014  ? Other fatigue 08/16/2014  ? Abdominal pain 04/03/2014  ? Colon cancer screening 04/03/2014  ? Helicobacter pylori (H. pylori) infection 03/21/2014  ? Preventative health care 08/27/2013  ? Sinusitis 08/08/2012  ? Hyperlipidemia, mixed 08/08/2012  ? Allergy 08/08/2012  ? Esophageal reflux 08/08/2012  ? Depression 01/23/2011  ? Leg swelling 01/23/2011  ? Multiple sclerosis ([place]) 09/23/2010  ? Essential hypertension 09/23/2010  ? Overactive bladder 09/23/2010  ? Thyroid nodule 09/23/2010  ? Pulmonary nodule 09/23/2010  ? Fatigue 09/23/2010  ? ? ?Toniann Fail, PT ?09/22/2021, 10:13 AM ? ?[place] ?[facility] ?[address], [address] ?[place], [place], [address] ?Phone: [phone]   Fax:  [phone] ? ?Name: Sydney Pace ?MRN: [id number] ?Date of Birth: 1956-05-24 ? ? ? ?

## 2021-09-22 NOTE — Telephone Encounter (Signed)
Pt request refill for methylphenidate (RITALIN) 20 MG tablet at [facility]  ?

## 2021-09-23 ENCOUNTER — Other Ambulatory Visit: Payer: Self-pay | Admitting: Family Medicine

## 2021-09-25 ENCOUNTER — Ambulatory Visit: Payer: Medicare (Managed Care)

## 2021-09-25 ENCOUNTER — Telehealth: Payer: Self-pay | Admitting: Family Medicine

## 2021-09-25 DIAGNOSIS — R262 Difficulty in walking, not elsewhere classified: Secondary | ICD-10-CM

## 2021-09-25 DIAGNOSIS — R2681 Unsteadiness on feet: Secondary | ICD-10-CM

## 2021-09-25 DIAGNOSIS — R2689 Other abnormalities of gait and mobility: Secondary | ICD-10-CM

## 2021-09-25 DIAGNOSIS — M6281 Muscle weakness (generalized): Secondary | ICD-10-CM

## 2021-09-25 DIAGNOSIS — M79661 Pain in right lower leg: Secondary | ICD-10-CM

## 2021-09-25 NOTE — Telephone Encounter (Signed)
Left message for patient to call back and schedule Medicare Annual Wellness Visit (AWV).  ? ?Please offer to do virtually or by telephone.  Left office number and my jabber [phone]. ? ?Last AWV:11/29/2017 ? ?Please schedule at anytime with Nurse Health Advisor. ?  ?

## 2021-09-25 NOTE — Therapy (Signed)
[place] [facility] ?[address], [address] ?[place], [place], [address] ?Phone: [phone]   Fax:  [phone] ? ?Physical Therapy Treatment ? ?Patient Details  ?Name: Sydney Pace ?MRN: [id number] ?Date of Birth: [birth year] ?Referring Provider (PT): [name], MD ? ? ?Encounter Date: 09/25/2021 ? ? PT End of Session - 09/25/21 1037   ? ? Visit Number 4   ? Number of Visits 12   ? Date for PT Re-Evaluation 11/10/21   ? Authorization Type [employer] 2023   ? Authorization Time Period after 25th visit   ? Authorization - Visit Number 4   ? Authorization - Number of Visits 25   ? Progress Note Due on Visit 10   ? PT Start Time 7253   ? PT Stop Time 1015   ? PT Time Calculation (min) 43 min   ? Activity Tolerance Patient tolerated treatment well   ? Behavior During Therapy [facility] for tasks assessed/performed   ? ?  ?  ? ?  ? ? ?Past Medical History:  ?Diagnosis Date  ? Allergy   ? Anemia   ? Arthritis   ? Arthritis 07/06/2017  ? Constipation   ? occ  ? Dehydration 06/09/2016  ? Depression   ? Esophageal reflux 08/08/2012  ? GERD (gastroesophageal reflux disease)   ? Hypertension   ? Inverted nipple   ? right nipple has always been inverted - per pt  ? MS (multiple sclerosis) ([place])   ? Neuromuscular disorder ([place])   ? MS  ? Other and unspecified hyperlipidemia 08/08/2012  ? Thyroid disease   ? ? ?Past Surgical History:  ?Procedure Laterality Date  ? COLONOSCOPY    ? HYSTEROSCOPY  02, 04, 2008  ? with D&C  ? ORIF WRIST FRACTURE Right 04/07/2018  ? Procedure: RIGHT WRIST OPEN REDUCTION INTERNAL FIXATION (ORIF);  Surgeon: Meredith Pel, MD;  Location: [place];  Service: Orthopedics;  Laterality: Right;  ? TMJ ARTHROSCOPY    ? TONSILLECTOMY    ? UPPER GASTROINTESTINAL ENDOSCOPY    ? ? ?There were no vitals filed for this visit. ? ? Subjective Assessment - 09/25/21 1035   ? ? Subjective Right knee pain with sidestepping exercise (lateral compartment right knee)   ? Currently in  Pain? Yes   ? Pain Score 6    ? Pain Location Knee   ? Pain Orientation Right;Lateral   ? Pain Type Chronic pain   ? Pain Onset More than a month ago   ? Aggravating Factors  sidestepping   ? ?  ?  ? ?  ? ? ? ? ? ? ? ? ? ? ? ? ? ? ? ? ? ? ? ? [place] Adult PT Treatment/Exercise - 09/25/21 0001   ? ?  ? Knee/Hip Exercises: Aerobic  ? Other Aerobic UBE x 4 min level 1 for activity tolerance   2' forwrad/backward  ?  ? Knee/Hip Exercises: Standing  ? Heel Raises Both;3 sets;10 reps   ? Heel Raises Limitations counter top support   ? Hip Abduction Stengthening;Both;2 sets;10 reps   ? Abduction Limitations green loop around knees   ? Other Standing Knee Exercises sidestepping with countertop 1 x 60 sec with green loop   discontinued due to right lateral knee pain  ?  ? Knee/Hip Exercises: Seated  ? Clamshell with TheraBand Green   3x10  ? Sit to Sand 3 sets;5 reps;with UE support   with green loop and  manual contact to stabilize right knee  ? ?  ?  ? ?  ? ? ? ? ? ? Balance Exercises - 09/25/21 0001   ? ?  ? Balance Exercises: Standing  ? Standing Eyes Opened Narrow base of support (BOS);Foam/compliant surface;2 reps;Other (comment);Head turns;30 secs   ? Standing Eyes Closed Foam/compliant surface;2 reps;30 secs;Head turns   ? Tandem Stance Eyes open;Intermittent upper extremity support;2 reps;30 secs   ? ?  ?  ? ?  ? ? ? ? ? PT Education - 09/25/21 1036   ? ? Education Details modification of HEP to eliminate sidestep, replace with standing hip abd with resistance loop around knees or ankles   ? Person(s) Educated Patient   ? Methods Explanation;Demonstration   ? Comprehension Verbalized understanding;Returned demonstration   ? ?  ?  ? ?  ? ? ? PT Short Term Goals - 09/15/21 1227   ? ?  ? PT SHORT TERM GOAL #1  ? Title be independent in initial HEP   ? Time 3   ? Period Weeks   ? Status New   ? Target Date 10/06/21   ?  ? PT SHORT TERM GOAL #2  ? Title Reduce risk for falls as evidenced by time of 28 sec TUG test   ?  Baseline 34.13 sec with [facility]   ? Time 3   ? Period Weeks   ? Status New   ? Target Date 10/06/21   ?  ? PT SHORT TERM GOAL #3  ? Title improve LE strength to perform sit to stand with single UE support   ? Baseline heavy reliance BUE   ? Time 3   ? Period Weeks   ? Status New   ? Target Date 10/06/21   ? ?  ?  ? ?  ? ? ? ? PT Long Term Goals - 09/15/21 1229   ? ?  ? PT LONG TERM GOAL #1  ? Title be independent in advanced HEP   ? Time 6   ? Period Weeks   ? Status New   ? Target Date 11/10/21   ?  ? PT LONG TERM GOAL #2  ? Title Demo improved gait tolerance and speed for community level ambulation to 1.8 ft/sec   ? Baseline 1.3 ft/sec   ? Time 6   ? Period Weeks   ? Status New   ? Target Date 11/10/21   ? ?  ?  ? ?  ? ? ? ? ? ? ? ? Plan - 09/25/21 1037   ? ? Clinical Impression Statement Increased right knee lateral compartment pain with sidestepping activity (in loading response). Reports decrease in right knee pain with sit to stand activity when therapist stabilized knee with varus directed force.  Pt demonstrates pronounced right knee genu valgum deformity and very obvious joint crepitus, shifting, and instability in her right knee which causes pain and dysfunction with transfers and gait.  Patient would benefit from bracing intervention for right knee to improve stability and reduce pain during transfers and gait. Patient has asked therapist to communicate this need to referring MD for proper prescription and referral   ? Personal Factors and Comorbidities Age;Time since onset of injury/illness/exacerbation;Past/Current Experience   ? Examination-Activity Limitations Lift;Stand;Stairs;Squat;Locomotion Level;Transfers   ? Examination-Participation Restrictions Cleaning;Community Activity;Yard Work;Meal Prep   ? Stability/Clinical Decision Making Evolving/Moderate complexity   ? Rehab Potential Good   ? PT Frequency 2x / week   ? PT Duration  6 weeks   ? PT Treatment/Interventions ADLs/Self Care Home  Management;Aquatic Therapy;Electrical Stimulation;DME Instruction;Gait training;Stair training;Functional mobility training;Therapeutic activities;Therapeutic exercise;Balance training;Neuromuscular re-education;Manual techniques;Patient/family education;Dry needling;Energy conservation;Taping   ? PT Next Visit Plan develop general HEP activities   ? PT Home Exercise Plan proper use of AD, standing hip abd with red loop, sit to stand with loop around knees, heel raise   ? Consulted and Agree with Plan of Care Patient   ? ?  ?  ? ?  ? ? ?Patient will benefit from skilled therapeutic intervention in order to improve the following deficits and impairments:  Abnormal gait, Decreased activity tolerance, Decreased balance, Decreased endurance, Decreased knowledge of use of DME, Decreased mobility, Decreased strength, Difficulty walking, Improper body mechanics, Pain, Obesity ? ?Visit Diagnosis: ?Unsteadiness on feet ? ?Muscle weakness (generalized) ? ?Difficulty in walking, not elsewhere classified ? ?Other abnormalities of gait and mobility ? ?Pain in right lower leg ? ? ? ? ?Problem List ?Patient Active Problem List  ? Diagnosis Date Noted  ? Low vitamin D level 07/14/2021  ? Polyp of colon 07/14/2021  ? Urinary incontinence 01/30/2021  ? Restless leg syndrome 08/03/2019  ? Attention deficit disorder (ADD) in adult 08/03/2019  ? Weakness of both lower extremities 07/26/2019  ? Left shoulder pain 07/26/2019  ? Hyperglycemia 07/25/2019  ? Shingles 10/09/2018  ? Lumbar radiculopathy 08/02/2018  ? Numbness 08/02/2018  ? Post-menopausal bleeding 03/17/2018  ? Dysuria 03/17/2018  ? High serum parathyroid hormone (PTH) 01/10/2018  ? Hypercalcemia 01/10/2018  ? Chronic heel pain, left 07/06/2017  ? Arthritis 07/06/2017  ? Osteopenia 07/06/2017  ? Hx of colonic polyp 07/06/2017  ? Cervical cancer screening 07/06/2017  ? Obesity 07/06/2017  ? Dry eyes, bilateral 10/10/2015  ? Low-tension glaucoma of both eyes, moderate stage  10/10/2015  ? Nuclear sclerotic cataract of both eyes 10/10/2015  ? Partial optic atrophy of left eye 10/10/2015  ? Insomnia 08/16/2014  ? Optic neuritis 08/16/2014  ? Ataxic gait 08/16/2014  ? Other fatigue 08/16/2014  ? Ab

## 2021-09-29 ENCOUNTER — Ambulatory Visit: Payer: Medicare (Managed Care) | Attending: Family Medicine

## 2021-09-29 DIAGNOSIS — M6281 Muscle weakness (generalized): Secondary | ICD-10-CM | POA: Insufficient documentation

## 2021-09-29 DIAGNOSIS — R2681 Unsteadiness on feet: Secondary | ICD-10-CM | POA: Insufficient documentation

## 2021-09-29 DIAGNOSIS — M79661 Pain in right lower leg: Secondary | ICD-10-CM | POA: Insufficient documentation

## 2021-09-29 DIAGNOSIS — R2689 Other abnormalities of gait and mobility: Secondary | ICD-10-CM | POA: Insufficient documentation

## 2021-09-29 DIAGNOSIS — R262 Difficulty in walking, not elsewhere classified: Secondary | ICD-10-CM | POA: Diagnosis not present

## 2021-09-29 NOTE — Therapy (Signed)
[place] ?[facility] ?[address], [address] ?[place], [place], [address] ?Phone: [phone]   Fax:  [phone] ? ?Physical Therapy Treatment ? ?Patient Details  ?Name: Sydney Pace ?MRN: [id number] ?Date of Birth: 01/18/1956 ?Referring Provider (PT): [name], MD ? ? ?Encounter Date: 09/29/2021 ? ? PT End of Session - 09/29/21 0936   ? ? Visit Number 5   ? Number of Visits 12   ? Date for PT Re-Evaluation 11/10/21   ? Authorization Type [employer] ADvantage 2023   ? Authorization Time Period after 25th visit   ? Authorization - Visit Number 5   ? Authorization - Number of Visits 25   ? Progress Note Due on Visit 10   ? PT Start Time [phone]   ? PT Stop Time 1015   ? PT Time Calculation (min) 39 min   ? Activity Tolerance Patient tolerated treatment well   ? Behavior During Therapy [facility] for tasks assessed/performed   ? ?  ?  ? ?  ? ? ?Past Medical History:  ?Diagnosis Date  ? Allergy   ? Anemia   ? Arthritis   ? Arthritis 07/06/2017  ? Constipation   ? occ  ? Dehydration 06/09/2016  ? Depression   ? Esophageal reflux 08/08/2012  ? GERD (gastroesophageal reflux disease)   ? Hypertension   ? Inverted nipple   ? right nipple has always been inverted - per pt  ? MS (multiple sclerosis) ([place])   ? Neuromuscular disorder ([place])   ? MS  ? Other and unspecified hyperlipidemia 08/08/2012  ? Thyroid disease   ? ? ?Past Surgical History:  ?Procedure Laterality Date  ? COLONOSCOPY    ? HYSTEROSCOPY  02, 04, 2008  ? with D&C  ? ORIF WRIST FRACTURE Right 04/07/2018  ? Procedure: RIGHT WRIST OPEN REDUCTION INTERNAL FIXATION (ORIF);  Surgeon: Meredith Pel, MD;  Location: [place];  Service: Orthopedics;  Laterality: Right;  ? TMJ ARTHROSCOPY    ? TONSILLECTOMY    ? UPPER GASTROINTESTINAL ENDOSCOPY    ? ? ?There were no vitals filed for this visit. ? ? Subjective Assessment - 09/29/21 0941   ? ? Subjective No new issues. Using [employer] to walk right now, but doesn't care for it   ? Pain Onset More than a  month ago   ? ?  ?  ? ?  ? ? ? ? ? ? ? ? ? ? ? ? ? ? ? ? ? ? ? ? [place] Adult PT Treatment/Exercise - 09/29/21 0001   ? ?  ? Knee/Hip Exercises: Standing  ? Heel Raises Both;3 sets;10 reps   ? Heel Raises Limitations counter top support   ? Hip Abduction Stengthening;Both;2 sets;10 reps   ? Abduction Limitations green loop around knees   ?  ? Knee/Hip Exercises: Seated  ? Ball Squeeze 2x10 3 sec hold   ? Clamshell with TheraBand Green   3x10  ? Sit to Sand 3 sets;5 reps;with UE support   green loop around knees, manual contact to right knee for stabilizing with varus directed force  ?  ? Knee/Hip Exercises: Supine  ? Quad Sets Strengthening;Right;3 sets;10 reps   ? Quad Sets Limitations 3 sec hold   ? ?  ?  ? ?  ? ? ? ? ? ? Balance Exercises - 09/29/21 0001   ? ?  ? Balance Exercises: Standing  ? Standing Eyes Opened Narrow base of support (BOS);Foam/compliant surface;2 reps;Other (  comment);Head turns;30 secs   ? Standing Eyes Closed Foam/compliant surface;2 reps;30 secs;Head turns   ? Tandem Stance Eyes open;Intermittent upper extremity support;2 reps;30 secs   ? ?  ?  ? ?  ? ? ? ? ? ? ? PT Short Term Goals - 09/15/21 1227   ? ?  ? PT SHORT TERM GOAL #1  ? Title be independent in initial HEP   ? Time 3   ? Period Weeks   ? Status New   ? Target Date 10/06/21   ?  ? PT SHORT TERM GOAL #2  ? Title Reduce risk for falls as evidenced by time of 28 sec TUG test   ? Baseline 34.13 sec with [facility]   ? Time 3   ? Period Weeks   ? Status New   ? Target Date 10/06/21   ?  ? PT SHORT TERM GOAL #3  ? Title improve LE strength to perform sit to stand with single UE support   ? Baseline heavy reliance BUE   ? Time 3   ? Period Weeks   ? Status New   ? Target Date 10/06/21   ? ?  ?  ? ?  ? ? ? ? PT Long Term Goals - 09/15/21 1229   ? ?  ? PT LONG TERM GOAL #1  ? Title be independent in advanced HEP   ? Time 6   ? Period Weeks   ? Status New   ? Target Date 11/10/21   ?  ? PT LONG TERM GOAL #2  ? Title Demo improved gait tolerance and  speed for community level ambulation to 1.8 ft/sec   ? Baseline 1.3 ft/sec   ? Time 6   ? Period Weeks   ? Status New   ? Target Date 11/10/21   ? ?  ?  ? ?  ? ? ? ? ? ? ? ? Plan - 09/29/21 1105   ? ? Clinical Impression Statement Progressing with HEP development with addition of isometrics to improve quad and hip add strength in sitting. Pt reports she visited [employer] to familiarize with layout and intends to try exercise one day this week. Continued with session to progress LE strength and balance with report of decrease right lateral compartment pain with therapist providing strong varus directed force during mini-squat position. Unsteadiness present with balance activities with head turns, especially on compliant surfaces. Continued sessions to progress strength, balance, reduce risk for falls, and prepare for transition to fitness-facility activities   ? Personal Factors and Comorbidities Age;Time since onset of injury/illness/exacerbation;Past/Current Experience   ? Examination-Activity Limitations Lift;Stand;Stairs;Squat;Locomotion Level;Transfers   ? Examination-Participation Restrictions Cleaning;Community Activity;Yard Work;Meal Prep   ? Stability/Clinical Decision Making Evolving/Moderate complexity   ? Rehab Potential Good   ? PT Frequency 2x / week   ? PT Duration 6 weeks   ? PT Treatment/Interventions ADLs/Self Care Home Management;Aquatic Therapy;Electrical Stimulation;DME Instruction;Gait training;Stair training;Functional mobility training;Therapeutic activities;Therapeutic exercise;Balance training;Neuromuscular re-education;Manual techniques;Patient/family education;Dry needling;Energy conservation;Taping   ? PT Next Visit Plan develop general HEP activities   ? PT Home Exercise Plan proper use of AD, standing hip abd with red loop, sit to stand with loop around knees, heel raise   ? Consulted and Agree with Plan of Care Patient   ? ?  ?  ? ?  ? ? ?Patient will benefit from skilled therapeutic  intervention in order to improve the following deficits and impairments:  Abnormal gait, Decreased activity tolerance, Decreased balance,  Decreased endurance, Decreased knowledge of use of DME, Decreased mobility, Decreased strength, Difficulty walking, Improper body mechanics, Pain, Obesity ? ?Visit Diagnosis: ?Unsteadiness on feet ? ?Muscle weakness (generalized) ? ?Difficulty in walking, not elsewhere classified ? ?Other abnormalities of gait and mobility ? ?Pain in right lower leg ? ? ? ? ?Problem List ?Patient Active Problem List  ? Diagnosis Date Noted  ? Low vitamin D level 07/14/2021  ? Polyp of colon 07/14/2021  ? Urinary incontinence 01/30/2021  ? Restless leg syndrome 08/03/2019  ? Attention deficit disorder (ADD) in adult 08/03/2019  ? Weakness of both lower extremities 07/26/2019  ? Left shoulder pain 07/26/2019  ? Hyperglycemia 07/25/2019  ? Shingles 10/09/2018  ? Lumbar radiculopathy 08/02/2018  ? Numbness 08/02/2018  ? Post-menopausal bleeding 03/17/2018  ? Dysuria 03/17/2018  ? High serum parathyroid hormone (PTH) 01/10/2018  ? Hypercalcemia 01/10/2018  ? Chronic heel pain, left 07/06/2017  ? Arthritis 07/06/2017  ? Osteopenia 07/06/2017  ? Hx of colonic polyp 07/06/2017  ? Cervical cancer screening 07/06/2017  ? Obesity 07/06/2017  ? Dry eyes, bilateral 10/10/2015  ? Low-tension glaucoma of both eyes, moderate stage 10/10/2015  ? Nuclear sclerotic cataract of both eyes 10/10/2015  ? Partial optic atrophy of left eye 10/10/2015  ? Insomnia 08/16/2014  ? Optic neuritis 08/16/2014  ? Ataxic gait 08/16/2014  ? Other fatigue 08/16/2014  ? Abdominal pain 04/03/2014  ? Colon cancer screening 04/03/2014  ? Helicobacter pylori (H. pylori) infection 03/21/2014  ? Preventative health care 08/27/2013  ? Sinusitis 08/08/2012  ? Hyperlipidemia, mixed 08/08/2012  ? Allergy 08/08/2012  ? Esophageal reflux 08/08/2012  ? Depression 01/23/2011  ? Leg swelling 01/23/2011  ? Multiple sclerosis ([place]) 09/23/2010  ?  Essential hypertension 09/23/2010  ? Overactive bladder 09/23/2010  ? Thyroid nodule 09/23/2010  ? Pulmonary nodule 09/23/2010  ? Fatigue 09/23/2010  ? ? ?Toniann Fail, PT ?09/29/2021, 11:09 AM ? ?[place] ?

## 2021-10-02 ENCOUNTER — Ambulatory Visit: Payer: Medicare (Managed Care)

## 2021-10-02 ENCOUNTER — Telehealth: Payer: Self-pay

## 2021-10-02 DIAGNOSIS — R2681 Unsteadiness on feet: Secondary | ICD-10-CM | POA: Diagnosis not present

## 2021-10-02 DIAGNOSIS — R2689 Other abnormalities of gait and mobility: Secondary | ICD-10-CM

## 2021-10-02 DIAGNOSIS — M6281 Muscle weakness (generalized): Secondary | ICD-10-CM

## 2021-10-02 DIAGNOSIS — R262 Difficulty in walking, not elsewhere classified: Secondary | ICD-10-CM

## 2021-10-02 NOTE — Therapy (Signed)
[place] ?[facility] ?[address], [address] ?[place], [place], [address] ?Phone: [phone]   Fax:  [phone] ? ?Physical Therapy Treatment ? ?Patient Details  ?Name: Sydney Pace ?MRN: [id number] ?Date of Birth: 1956-02-18 ?Referring Provider (PT): [name], MD ? ? ?Encounter Date: 10/02/2021 ? ? PT End of Session - 10/02/21 1208   ? ? Visit Number 6   ? Number of Visits 12   ? Date for PT Re-Evaluation 11/10/21   ? Authorization Type [employer] ADvantage 2023   ? Authorization Time Period after 25th visit   ? Authorization - Visit Number 6   ? Authorization - Number of Visits 25   ? Progress Note Due on Visit 10   ? PT Start Time 1200   late arrival  ? PT Stop Time 1230   ? PT Time Calculation (min) 30 min   ? Activity Tolerance Patient tolerated treatment well   ? Behavior During Therapy [facility] for tasks assessed/performed   ? ?  ?  ? ?  ? ? ?Past Medical History:  ?Diagnosis Date  ? Allergy   ? Anemia   ? Arthritis   ? Arthritis 07/06/2017  ? Constipation   ? occ  ? Dehydration 06/09/2016  ? Depression   ? Esophageal reflux 08/08/2012  ? GERD (gastroesophageal reflux disease)   ? Hypertension   ? Inverted nipple   ? right nipple has always been inverted - per pt  ? MS (multiple sclerosis) ([place])   ? Neuromuscular disorder ([place])   ? MS  ? Other and unspecified hyperlipidemia 08/08/2012  ? Thyroid disease   ? ? ?Past Surgical History:  ?Procedure Laterality Date  ? COLONOSCOPY    ? HYSTEROSCOPY  02, 04, 2008  ? with D&C  ? ORIF WRIST FRACTURE Right 04/07/2018  ? Procedure: RIGHT WRIST OPEN REDUCTION INTERNAL FIXATION (ORIF);  Surgeon: Meredith Pel, MD;  Location: [facility];  Service: Orthopedics;  Laterality: Right;  ? TMJ ARTHROSCOPY    ? TONSILLECTOMY    ? UPPER GASTROINTESTINAL ENDOSCOPY    ? ? ?There were no vitals filed for this visit. ? ? Subjective Assessment - 10/02/21 1214   ? ? Subjective stomach upset today, low energy   ? Currently in Pain? Yes   ? Pain Score 7    ?  Pain Location Knee   ? Pain Orientation Right;Lateral   ? Pain Type Chronic pain   ? Pain Onset More than a month ago   ? ?  ?  ? ?  ? ? ? ? ? ? ? ? ? ? ? ? ? ? ? ? ? ? ? ? [facility] Adult PT Treatment/Exercise - 10/02/21 0001   ? ?  ? Knee/Hip Exercises: Standing  ? Hip Abduction Stengthening;Both;2 sets;10 reps   ? Abduction Limitations green loop around knees   ?  ? Knee/Hip Exercises: Seated  ? Ball Squeeze 3x10   ? Clamshell with Marga Hoots   ? Marching Strengthening;Both;2 sets;10 reps   ? Marching Limitations green t-loop around feet   ? Sit to [place] 3 sets   3 reps with green t-loop  ? ?  ?  ? ?  ? ? ? ? ? ? Balance Exercises - 10/02/21 0001   ? ?  ? Balance Exercises: Standing  ? Standing Eyes Opened Narrow base of support (BOS);Wide (BOA);Head turns;Foam/compliant surface;2 reps;30 secs   ? Standing Eyes Closed Narrow base of support (BOS);Wide (BOA);Head turns;Foam/compliant  surface;2 reps;30 secs   ? ?  ?  ? ?  ? ? ? ? ? ? ? PT Short Term Goals - 09/15/21 1227   ? ?  ? PT SHORT TERM GOAL #1  ? Title be independent in initial HEP   ? Time 3   ? Period Weeks   ? Status New   ? Target Date 10/06/21   ?  ? PT SHORT TERM GOAL #2  ? Title Reduce risk for falls as evidenced by time of 28 sec TUG test   ? Baseline 34.13 sec with [facility]   ? Time 3   ? Period Weeks   ? Status New   ? Target Date 10/06/21   ?  ? PT SHORT TERM GOAL #3  ? Title improve LE strength to perform sit to stand with single UE support   ? Baseline heavy reliance BUE   ? Time 3   ? Period Weeks   ? Status New   ? Target Date 10/06/21   ? ?  ?  ? ?  ? ? ? ? PT Long Term Goals - 09/15/21 1229   ? ?  ? PT LONG TERM GOAL #1  ? Title be independent in advanced HEP   ? Time 6   ? Period Weeks   ? Status New   ? Target Date 11/10/21   ?  ? PT LONG TERM GOAL #2  ? Title Demo improved gait tolerance and speed for community level ambulation to 1.8 ft/sec   ? Baseline 1.3 ft/sec   ? Time 6   ? Period Weeks   ? Status New   ? Target Date 11/10/21   ? ?  ?   ? ?  ? ? ? ? ? ? ? ? Plan - 10/02/21 1240   ? ? Clinical Impression Statement Pt reports not feeling well today due to stoomach upset. Able to perform HEP atcivities with good return demonstration and addition of seated hip flexion with resistance for benefit of improved hip flexion and abdominal strength. Continued to encourage pt to pursue routine at [facility] where she has membership. Difficulty in walking due to right lateral knee pain and instability with limits sit to stand transfers and weight acceptance during gait. Patient would likely benefit from some kind of bracing option to improve right knee stability and reduce pain to improve safety with gait and transfers   ? Personal Factors and Comorbidities Age;Time since onset of injury/illness/exacerbation;Past/Current Experience   ? Examination-Activity Limitations Lift;Stand;Stairs;Squat;Locomotion Level;Transfers   ? Examination-Participation Restrictions Cleaning;Community Activity;Yard Work;Meal Prep   ? Stability/Clinical Decision Making Evolving/Moderate complexity   ? Rehab Potential Good   ? PT Frequency 2x / week   ? PT Duration 6 weeks   ? PT Treatment/Interventions ADLs/Self Care Home Management;Aquatic Therapy;Electrical Stimulation;DME Instruction;Gait training;Stair training;Functional mobility training;Therapeutic activities;Therapeutic exercise;Balance training;Neuromuscular re-education;Manual techniques;Patient/family education;Dry needling;Energy conservation;Taping   ? PT Next Visit Plan develop general HEP activities   ? PT Home Exercise Plan proper use of AD, standing hip abd with red loop, sit to stand with loop around knees, heel raise   ? Consulted and Agree with Plan of Care Patient   ? ?  ?  ? ?  ? ? ?Patient will benefit from skilled therapeutic intervention in order to improve the following deficits and impairments:  Abnormal gait, Decreased activity tolerance, Decreased balance, Decreased endurance, Decreased knowledge of use  of DME, Decreased mobility, Decreased strength, Difficulty walking, Improper body mechanics,  Pain, Obesity ? ?Visit Diagnosis: ?Unsteadiness on feet ? ?Muscle weakness (generalized) ? ?Difficulty in walking, not elsewhere classified ? ?Other abnormalities of gait and mobility ? ? ? ? ?Problem List ?Patient Active Problem List  ? Diagnosis Date Noted  ? Low vitamin D level 07/14/2021  ? Polyp of colon 07/14/2021  ? Urinary incontinence 01/30/2021  ? Restless leg syndrome 08/03/2019  ? Attention deficit disorder (ADD) in adult 08/03/2019  ? Weakness of both lower extremities 07/26/2019  ? Left shoulder pain 07/26/2019  ? Hyperglycemia 07/25/2019  ? Shingles 10/09/2018  ? Lumbar radiculopathy 08/02/2018  ? Numbness 08/02/2018  ? Post-menopausal bleeding 03/17/2018  ? Dysuria 03/17/2018  ? High serum parathyroid hormone (PTH) 01/10/2018  ? Hypercalcemia 01/10/2018  ? Chronic heel pain, left 07/06/2017  ? Arthritis 07/06/2017  ? Osteopenia 07/06/2017  ? Hx of colonic polyp 07/06/2017  ? Cervical cancer screening 07/06/2017  ? Obesity 07/06/2017  ? Dry eyes, bilateral 10/10/2015  ? Low-tension glaucoma of both eyes, moderate stage 10/10/2015  ? Nuclear sclerotic cataract of both eyes 10/10/2015  ? Partial optic atrophy of left eye 10/10/2015  ? Insomnia 08/16/2014  ? Optic neuritis 08/16/2014  ? Ataxic gait 08/16/2014  ? Other fatigue 08/16/2014  ? Abdominal pain 04/03/2014  ? Colon cancer screening 04/03/2014  ? Helicobacter pylori (H. pylori) infection 03/21/2014  ? Preventative health care 08/27/2013  ? Sinusitis 08/08/2012  ? Hyperlipidemia, mixed 08/08/2012  ? Allergy 08/08/2012  ? Esophageal reflux 08/08/2012  ? Depression 01/23/2011  ? Leg swelling 01/23/2011  ? Multiple sclerosis ([place]) 09/23/2010  ? Essential hypertension 09/23/2010  ? Overactive bladder 09/23/2010  ? Thyroid nodule 09/23/2010  ? Pulmonary nodule 09/23/2010  ? Fatigue 09/23/2010  ? ? ?Toniann Fail, PT ?10/02/2021, 12:43 PM ? ?[facility] ?[address], [address] ?[place], [place], [address] ?Phone: [phone]   Fax:  [phone] ? ?Name: Sydney Pace ?MRN: [id number] ?Date of Birth: January 05, 1956 ? ? ? ?

## 2021-10-02 NOTE — Telephone Encounter (Signed)
Hi Dr. Charlett Blake, ? ?Ms. Sydney Pace is being treated by physical therapy for deficits related to MS and LBP. During the course of our time together it has become apparent that she has significant lateral right knee pain and instability present.  Sydney Pace will benefit from use of a Right knee brace intervention in order to improve safety with functional mobility.   ? ?Due to insurance regulations, patients must have a face-to-face visit with a physician within the last 6 months wherein the benefit of bracing has been discussed and documented in the patient file. You may want to have the patient schedule a new appointment with you, or if you feel comfortable you may addend your previous note with the patient, stating that brace/orthotics were discussed.  ? ?Supporting documentation needs to include: ?- ICD 10 to specify laterality and problem (e.g. right knee pain) ?- Specify functional benefit/medical necessity (e.g. needed to improve safety with transfers and gait) ? ? ?If you agree, please submit request in EPIC under MD Order, Other Orders (list Right knee brace in comments) and please include the ICD-10 code, MD signature, and NPI. You may also fax to [facility] at [phone].  ? ?Thank you, ?2:15 PM, 10/02/21 ?Celedonio Miyamoto, PT, DPT ?Physical Therapist- [place] ?Office Number: [phone]  ? ? ?[facility] ?[address] ?[place], [place]  [address] ?Phone:  [phone] ?Fax:  [phone] ?  ?

## 2021-10-06 ENCOUNTER — Ambulatory Visit: Payer: Medicare (Managed Care)

## 2021-10-06 DIAGNOSIS — R262 Difficulty in walking, not elsewhere classified: Secondary | ICD-10-CM

## 2021-10-06 DIAGNOSIS — R2681 Unsteadiness on feet: Secondary | ICD-10-CM | POA: Diagnosis not present

## 2021-10-06 DIAGNOSIS — R2689 Other abnormalities of gait and mobility: Secondary | ICD-10-CM

## 2021-10-06 DIAGNOSIS — M6281 Muscle weakness (generalized): Secondary | ICD-10-CM

## 2021-10-06 DIAGNOSIS — M79661 Pain in right lower leg: Secondary | ICD-10-CM

## 2021-10-06 NOTE — Therapy (Signed)
[place] ?[facility] ?[address], [address] ?[place], [place], [address] ?Phone: [phone]   Fax:  [phone] ? ?Physical Therapy Treatment ? ?Patient Details  ?Name: Sydney Pace ?MRN: [id number] ?Date of Birth: 23-Mar-1956 ?Referring Provider (PT): [name], MD ? ? ?Encounter Date: 10/06/2021 ? ? PT End of Session - 10/06/21 1025   ? ? Visit Number 7   ? Number of Visits 12   ? Date for PT Re-Evaluation 11/10/21   ? Authorization Type [employer] Medicare ADvantage 2023   ? Authorization Time Period after 25th visit   ? Authorization - Visit Number 7   ? Authorization - Number of Visits 25   ? Progress Note Due on Visit 10   ? PT Start Time 1021   ? PT Stop Time 1100   ? PT Time Calculation (min) 39 min   ? Activity Tolerance Patient tolerated treatment well   ? Behavior During Therapy [facility] for tasks assessed/performed   ? ?  ?  ? ?  ? ? ?Past Medical History:  ?Diagnosis Date  ? Allergy   ? Anemia   ? Arthritis   ? Arthritis 07/06/2017  ? Constipation   ? occ  ? Dehydration 06/09/2016  ? Depression   ? Esophageal reflux 08/08/2012  ? GERD (gastroesophageal reflux disease)   ? Hypertension   ? Inverted nipple   ? right nipple has always been inverted - per pt  ? MS (multiple sclerosis) ([place])   ? Neuromuscular disorder ([place])   ? MS  ? Other and unspecified hyperlipidemia 08/08/2012  ? Thyroid disease   ? ? ?Past Surgical History:  ?Procedure Laterality Date  ? COLONOSCOPY    ? HYSTEROSCOPY  02, 04, 2008  ? with D&C  ? ORIF WRIST FRACTURE Right 04/07/2018  ? Procedure: RIGHT WRIST OPEN REDUCTION INTERNAL FIXATION (ORIF);  Surgeon: Meredith Pel, MD;  Location: [facility];  Service: Orthopedics;  Laterality: Right;  ? TMJ ARTHROSCOPY    ? TONSILLECTOMY    ? UPPER GASTROINTESTINAL ENDOSCOPY    ? ? ?There were no vitals filed for this visit. ? ? Subjective Assessment - 10/06/21 1030   ? ? Subjective Feeling down, knees hurt, hard time lifting feet.   ? Currently in Pain? Yes   ? Pain Score 6     ? Pain Location Knee   ? Pain Orientation Right;Lateral   ? Pain Descriptors / Indicators Sore   ? Pain Type Chronic pain   ? Pain Onset More than a month ago   ? ?  ?  ? ?  ? ? ? ? ? ? ? ? ? ? ? ? ? ? ? ? ? ? ? ? [facility] Adult PT Treatment/Exercise - 10/06/21 0001   ? ?  ? Ambulation/Gait  ? Ambulation/Gait Yes   ? Ambulation/Gait Assistance 6: Modified independent (Device/Increase time)   ? Ambulation Distance (Feet) 200 Feet   ? Assistive device 4-wheeled walker   ? Gait Comments training in use of rollator to improve safety and efficiency of gait   ?  ? Knee/Hip Exercises: Standing  ? Hip Abduction Stengthening;Both;2 sets;10 reps   ? Abduction Limitations green loop around knees   ?  ? Knee/Hip Exercises: Seated  ? Ball Squeeze 3x10   ? Clamshell with Marga Hoots   ? Marching Strengthening;Both;2 sets;10 reps   ? Marching Limitations green t-loop around feet   ? Sit to [employer] 3 sets   3 reps,  therapist guiding right knee  ? ?  ?  ? ?  ? ? ? ? ? ? Balance Exercises - 10/06/21 0001   ? ?  ? Balance Exercises: Standing  ? Standing Eyes Opened Wide (BOA);Foam/compliant surface;2 reps;30 secs;Narrow base of support (BOS)   mild-mod postural perturbations  ? Standing Eyes Closed Wide (BOA);Foam/compliant surface;2 reps;30 secs   ? Tandem Stance Eyes open;Foam/compliant surface;2 reps;30 secs   semi-tandem  ? ?  ?  ? ?  ? ? ? ? ? PT Education - 10/06/21 1057   ? ? Education Details education in benefits of rollator vs NBQC   ? Person(s) Educated Patient   ? Methods Explanation;Demonstration   ? Comprehension Verbalized understanding   ? ?  ?  ? ?  ? ? ? PT Short Term Goals - 09/15/21 1227   ? ?  ? PT SHORT TERM GOAL #1  ? Title be independent in initial HEP   ? Time 3   ? Period Weeks   ? Status New   ? Target Date 10/06/21   ?  ? PT SHORT TERM GOAL #2  ? Title Reduce risk for falls as evidenced by time of 28 sec TUG test   ? Baseline 34.13 sec with [facility]   ? Time 3   ? Period Weeks   ? Status New   ? Target Date  10/06/21   ?  ? PT SHORT TERM GOAL #3  ? Title improve LE strength to perform sit to stand with single UE support   ? Baseline heavy reliance BUE   ? Time 3   ? Period Weeks   ? Status New   ? Target Date 10/06/21   ? ?  ?  ? ?  ? ? ? ? PT Long Term Goals - 09/15/21 1229   ? ?  ? PT LONG TERM GOAL #1  ? Title be independent in advanced HEP   ? Time 6   ? Period Weeks   ? Status New   ? Target Date 11/10/21   ?  ? PT LONG TERM GOAL #2  ? Title Demo improved gait tolerance and speed for community level ambulation to 1.8 ft/sec   ? Baseline 1.3 ft/sec   ? Time 6   ? Period Weeks   ? Status New   ? Target Date 11/10/21   ? ?  ?  ? ?  ? ? ? ? ? ? ? ? Plan - 10/06/21 1058   ? ? Clinical Impression Statement Progressing with HEP development and encouragement to attend [employer] for regular fitness routine. Improved sit to stand mobility and decreased crepitius with stabilizing right knee via varus moment. Improved gait comfort and speed using rollator vs NBQC   ? Personal Factors and Comorbidities Age;Time since onset of injury/illness/exacerbation;Past/Current Experience   ? Examination-Activity Limitations Lift;Stand;Stairs;Squat;Locomotion Level;Transfers   ? Examination-Participation Restrictions Cleaning;Community Activity;Yard Work;Meal Prep   ? Stability/Clinical Decision Making Evolving/Moderate complexity   ? Rehab Potential Good   ? PT Frequency 2x / week   ? PT Duration 6 weeks   ? PT Treatment/Interventions ADLs/Self Care Home Management;Aquatic Therapy;Electrical Stimulation;DME Instruction;Gait training;Stair training;Functional mobility training;Therapeutic activities;Therapeutic exercise;Balance training;Neuromuscular re-education;Manual techniques;Patient/family education;Dry needling;Energy conservation;Taping   ? PT Next Visit Plan develop general HEP activities   ? PT Home Exercise Plan proper use of AD, standing hip abd with red loop, sit to stand with loop around knees, heel raise   ? Consulted and Agree  with  Plan of Care Patient   ? ?  ?  ? ?  ? ? ?Patient will benefit from skilled therapeutic intervention in order to improve the following deficits and impairments:  Abnormal gait, Decreased activity tolerance, Decreased balance, Decreased endurance, Decreased knowledge of use of DME, Decreased mobility, Decreased strength, Difficulty walking, Improper body mechanics, Pain, Obesity ? ?Visit Diagnosis: ?Unsteadiness on feet ? ?Muscle weakness (generalized) ? ?Difficulty in walking, not elsewhere classified ? ?Other abnormalities of gait and mobility ? ?Pain in right lower leg ? ? ? ? ?Problem List ?Patient Active Problem List  ? Diagnosis Date Noted  ? Low vitamin D level 07/14/2021  ? Polyp of colon 07/14/2021  ? Urinary incontinence 01/30/2021  ? Restless leg syndrome 08/03/2019  ? Attention deficit disorder (ADD) in adult 08/03/2019  ? Weakness of both lower extremities 07/26/2019  ? Left shoulder pain 07/26/2019  ? Hyperglycemia 07/25/2019  ? Shingles 10/09/2018  ? Lumbar radiculopathy 08/02/2018  ? Numbness 08/02/2018  ? Post-menopausal bleeding 03/17/2018  ? Dysuria 03/17/2018  ? High serum parathyroid hormone (PTH) 01/10/2018  ? Hypercalcemia 01/10/2018  ? Chronic heel pain, left 07/06/2017  ? Arthritis 07/06/2017  ? Osteopenia 07/06/2017  ? Hx of colonic polyp 07/06/2017  ? Cervical cancer screening 07/06/2017  ? Obesity 07/06/2017  ? Dry eyes, bilateral 10/10/2015  ? Low-tension glaucoma of both eyes, moderate stage 10/10/2015  ? Nuclear sclerotic cataract of both eyes 10/10/2015  ? Partial optic atrophy of left eye 10/10/2015  ? Insomnia 08/16/2014  ? Optic neuritis 08/16/2014  ? Ataxic gait 08/16/2014  ? Other fatigue 08/16/2014  ? Abdominal pain 04/03/2014  ? Colon cancer screening 04/03/2014  ? Helicobacter pylori (H. pylori) infection 03/21/2014  ? Preventative health care 08/27/2013  ? Sinusitis 08/08/2012  ? Hyperlipidemia, mixed 08/08/2012  ? Allergy 08/08/2012  ? Esophageal reflux 08/08/2012  ?  Depression 01/23/2011  ? Leg swelling 01/23/2011  ? Multiple sclerosis ([place]) 09/23/2010  ? Essential hypertension 09/23/2010  ? Overactive bladder 09/23/2010  ? Thyroid nodule 09/23/2010  ? Pulmonary nodule 04/

## 2021-10-08 NOTE — Telephone Encounter (Signed)
Called pt. No answer but left VM asking for return call to schedule an appointment for a right knee brace. Advised pt in office visit or video visit is ok. ?

## 2021-10-09 ENCOUNTER — Ambulatory Visit: Payer: Medicare (Managed Care)

## 2021-10-09 DIAGNOSIS — M6281 Muscle weakness (generalized): Secondary | ICD-10-CM

## 2021-10-09 DIAGNOSIS — M79661 Pain in right lower leg: Secondary | ICD-10-CM

## 2021-10-09 DIAGNOSIS — R2681 Unsteadiness on feet: Secondary | ICD-10-CM | POA: Diagnosis not present

## 2021-10-09 DIAGNOSIS — R2689 Other abnormalities of gait and mobility: Secondary | ICD-10-CM

## 2021-10-09 DIAGNOSIS — R262 Difficulty in walking, not elsewhere classified: Secondary | ICD-10-CM

## 2021-10-09 NOTE — Therapy (Signed)
[place] [facility] ?[address], [address] ?[place], [place], [address] ?Phone: [phone]   Fax:  [phone] ? ?Physical Therapy Treatment ? ?Patient Details  ?Name: Sydney Pace ?MRN: [id number] ?Date of Birth: Dec 10, 1955 ?Referring Provider (PT): [name], MD ? ? ?Encounter Date: 10/09/2021 ? ? PT End of Session - 10/09/21 0927   ? ? Visit Number 8   ? Number of Visits 12   ? Date for PT Re-Evaluation 11/10/21   ? Authorization Type [employer] ADvantage 2023   ? Authorization Time Period after 25th visit   ? Authorization - Visit Number 8   ? Authorization - Number of Visits 25   ? Progress Note Due on Visit 10   ? PT Start Time 0930   ? PT Stop Time 1015   ? PT Time Calculation (min) 45 min   ? Activity Tolerance Patient tolerated treatment well   ? Behavior During Therapy [facility] for tasks assessed/performed   ? ?  ?  ? ?  ? ? ?Past Medical History:  ?Diagnosis Date  ? Allergy   ? Anemia   ? Arthritis   ? Arthritis 07/06/2017  ? Constipation   ? occ  ? Dehydration 06/09/2016  ? Depression   ? Esophageal reflux 08/08/2012  ? GERD (gastroesophageal reflux disease)   ? Hypertension   ? Inverted nipple   ? right nipple has always been inverted - per pt  ? MS (multiple sclerosis) ([place])   ? Neuromuscular disorder ([place])   ? MS  ? Other and unspecified hyperlipidemia 08/08/2012  ? Thyroid disease   ? ? ?Past Surgical History:  ?Procedure Laterality Date  ? COLONOSCOPY    ? HYSTEROSCOPY  02, 04, 2008  ? with D&C  ? ORIF WRIST FRACTURE Right 04/07/2018  ? Procedure: RIGHT WRIST OPEN REDUCTION INTERNAL FIXATION (ORIF);  Surgeon: Meredith Pel, MD;  Location: [place];  Service: Orthopedics;  Laterality: Right;  ? TMJ ARTHROSCOPY    ? TONSILLECTOMY    ? UPPER GASTROINTESTINAL ENDOSCOPY    ? ? ?There were no vitals filed for this visit. ? ? Subjective Assessment - 10/09/21 0928   ? ? Subjective Feeling a little better today, have a MD appointment later for knee assessment   ? Currently in  Pain? Yes   ? Pain Score 6    ? Pain Location Knee   ? Pain Orientation Right;Lateral   ? Pain Onset More than a month ago   ? ?  ?  ? ?  ? ? ? ? ? [facility] PT Assessment - 10/09/21 0001   ? ?  ? Assessment  ? Medical Diagnosis MS, lumbar radiculopthy   ? Referring Provider (PT) [name], MD   ? ?  ?  ? ?  ? ? ? ? ? ? ? ? ? ? ? ? ? ? ? ? [facility] Adult PT Treatment/Exercise - 10/09/21 0001   ? ?  ? Ambulation/Gait  ? Ambulation/Gait Yes   ? Gait Comments 2MWT with NBQC 150'. 2MWT with rollator: 210 ft   ?  ? Knee/Hip Exercises: Aerobic  ? Other Aerobic UBE x 5 min level 2 for activity tolerance   ?  ? Knee/Hip Exercises: Standing  ? Hip Abduction Stengthening;3 sets;10 reps   ? Abduction Limitations green loop around knees   ?  ? Knee/Hip Exercises: Seated  ? Ball Squeeze 3x10   ? Clamshell with Marga Hoots   ? Marching  Strengthening;Both;10 reps;3 sets   ? Marching Limitations green t-loop around feet   ?  ? Knee/Hip Exercises: Supine  ? Quad Sets Strengthening;Both;3 sets;10 reps   ? Quad Sets Limitations 3 sec hold   ? ?  ?  ? ?  ? ? ? ? ? ? Balance Exercises - 10/09/21 0001   ? ?  ? Balance Exercises: Standing  ? Standing Eyes Opened Narrow base of support (BOS);Wide (BOA);Head turns;Foam/compliant surface;2 reps;30 secs   ? Standing Eyes Closed Narrow base of support (BOS);Wide (BOA);Head turns;Foam/compliant surface;2 reps;30 secs   ? ?  ?  ? ?  ? ? ? ? ? ? ? PT Short Term Goals - 09/15/21 1227   ? ?  ? PT SHORT TERM GOAL #1  ? Title be independent in initial HEP   ? Time 3   ? Period Weeks   ? Status New   ? Target Date 10/06/21   ?  ? PT SHORT TERM GOAL #2  ? Title Reduce risk for falls as evidenced by time of 28 sec TUG test   ? Baseline 34.13 sec with [facility]   ? Time 3   ? Period Weeks   ? Status New   ? Target Date 10/06/21   ?  ? PT SHORT TERM GOAL #3  ? Title improve LE strength to perform sit to stand with single UE support   ? Baseline heavy reliance BUE   ? Time 3   ? Period Weeks   ? Status New   ?  Target Date 10/06/21   ? ?  ?  ? ?  ? ? ? ? PT Long Term Goals - 09/15/21 1229   ? ?  ? PT LONG TERM GOAL #1  ? Title be independent in advanced HEP   ? Time 6   ? Period Weeks   ? Status New   ? Target Date 11/10/21   ?  ? PT LONG TERM GOAL #2  ? Title Demo improved gait tolerance and speed for community level ambulation to 1.8 ft/sec   ? Baseline 1.3 ft/sec   ? Time 6   ? Period Weeks   ? Status New   ? Target Date 11/10/21   ? ?  ?  ? ?  ? ? ? ? ? ? ? ? Plan - 10/09/21 1014   ? ? Clinical Impression Statement Demonstrates improved gait speed with use of rollator vs NBQC and subjective report of decreased right knee pain with its use as well. Progressing with POC details and demonstrating improved HEP recall only requiring 10-25% cues for sequence/activity. Improving static balance on compliant surfaces with decrease in postural sway. Retro-LOB when standing on compliant surface with vertical head movements   ? Personal Factors and Comorbidities Age;Time since onset of injury/illness/exacerbation;Past/Current Experience   ? Examination-Activity Limitations Lift;Stand;Stairs;Squat;Locomotion Level;Transfers   ? Examination-Participation Restrictions Cleaning;Community Activity;Yard Work;Meal Prep   ? Stability/Clinical Decision Making Evolving/Moderate complexity   ? Rehab Potential Good   ? PT Frequency 2x / week   ? PT Duration 6 weeks   ? PT Treatment/Interventions ADLs/Self Care Home Management;Aquatic Therapy;Electrical Stimulation;DME Instruction;Gait training;Stair training;Functional mobility training;Therapeutic activities;Therapeutic exercise;Balance training;Neuromuscular re-education;Manual techniques;Patient/family education;Dry needling;Energy conservation;Taping   ? PT Next Visit Plan develop general HEP activities   ? PT Home Exercise Plan proper use of AD, standing hip abd with red loop, sit to stand with loop around knees, heel raise   ? Consulted and Agree with Plan of  Care Patient   ? ?  ?  ? ?   ? ? ?Patient will benefit from skilled therapeutic intervention in order to improve the following deficits and impairments:  Abnormal gait, Decreased activity tolerance, Decreased balance, Decreased endurance, Decreased knowledge of use of DME, Decreased mobility, Decreased strength, Difficulty walking, Improper body mechanics, Pain, Obesity ? ?Visit Diagnosis: ?Unsteadiness on feet ? ?Muscle weakness (generalized) ? ?Difficulty in walking, not elsewhere classified ? ?Other abnormalities of gait and mobility ? ?Pain in right lower leg ? ? ? ? ?Problem List ?Patient Active Problem List  ? Diagnosis Date Noted  ? Low vitamin D level 07/14/2021  ? Polyp of colon 07/14/2021  ? Urinary incontinence 01/30/2021  ? Restless leg syndrome 08/03/2019  ? Attention deficit disorder (ADD) in adult 08/03/2019  ? Weakness of both lower extremities 07/26/2019  ? Left shoulder pain 07/26/2019  ? Hyperglycemia 07/25/2019  ? Shingles 10/09/2018  ? Lumbar radiculopathy 08/02/2018  ? Numbness 08/02/2018  ? Post-menopausal bleeding 03/17/2018  ? Dysuria 03/17/2018  ? High serum parathyroid hormone (PTH) 01/10/2018  ? Hypercalcemia 01/10/2018  ? Chronic heel pain, left 07/06/2017  ? Arthritis 07/06/2017  ? Osteopenia 07/06/2017  ? Hx of colonic polyp 07/06/2017  ? Cervical cancer screening 07/06/2017  ? Obesity 07/06/2017  ? Dry eyes, bilateral 10/10/2015  ? Low-tension glaucoma of both eyes, moderate stage 10/10/2015  ? Nuclear sclerotic cataract of both eyes 10/10/2015  ? Partial optic atrophy of left eye 10/10/2015  ? Insomnia 08/16/2014  ? Optic neuritis 08/16/2014  ? Ataxic gait 08/16/2014  ? Other fatigue 08/16/2014  ? Abdominal pain 04/03/2014  ? Colon cancer screening 04/03/2014  ? Helicobacter pylori (H. pylori) infection 03/21/2014  ? Preventative health care 08/27/2013  ? Sinusitis 08/08/2012  ? Hyperlipidemia, mixed 08/08/2012  ? Allergy 08/08/2012  ? Esophageal reflux 08/08/2012  ? Depression 01/23/2011  ? Leg swelling  01/23/2011  ? Multiple sclerosis ([place]) 09/23/2010  ? Essential hypertension 09/23/2010  ? Overactive bladder 09/23/2010  ? Thyroid nodule 09/23/2010  ? Pulmonary nodule 09/23/2010  ? Fatigue 09/23/2010  ?

## 2021-10-13 ENCOUNTER — Ambulatory Visit: Payer: Medicare (Managed Care)

## 2021-10-16 ENCOUNTER — Ambulatory Visit: Payer: Medicare (Managed Care)

## 2021-10-16 ENCOUNTER — Telehealth: Payer: Self-pay | Admitting: Family Medicine

## 2021-10-16 DIAGNOSIS — G35 Multiple sclerosis: Secondary | ICD-10-CM

## 2021-10-16 DIAGNOSIS — R262 Difficulty in walking, not elsewhere classified: Secondary | ICD-10-CM

## 2021-10-16 DIAGNOSIS — R2689 Other abnormalities of gait and mobility: Secondary | ICD-10-CM

## 2021-10-16 DIAGNOSIS — R2681 Unsteadiness on feet: Secondary | ICD-10-CM

## 2021-10-16 DIAGNOSIS — M6281 Muscle weakness (generalized): Secondary | ICD-10-CM

## 2021-10-16 NOTE — Telephone Encounter (Signed)
Pt wanted to look into rolling walker for better movement vs her cane that she currently is using.

## 2021-10-16 NOTE — Therapy (Signed)
[facility] [place] [address] [place], [place], [address] Phone: [phone]   Fax:  [phone]  Physical Therapy Treatment  Patient Details  Name: Sydney Pace MRN: [id number] Date of Birth: 1955/09/08 Referring Provider (PT): [name], MD   Encounter Date: 10/16/2021   PT End of Session - 10/16/21 0950     Visit Number 9    Number of Visits 12    Date for PT Re-Evaluation 11/10/21    Authorization Type [employer] ADvantage 2023    Authorization Time Period after 25th visit    Authorization - Visit Number 9    Authorization - Number of Visits 25    Progress Note Due on Visit 10    PT Start Time 0945   late arrival   PT Stop Time 1015    PT Time Calculation (min) 30 min    Activity Tolerance Patient tolerated treatment well    Behavior During Therapy [facility] for tasks assessed/performed             Past Medical History:  Diagnosis Date   Allergy    Anemia    Arthritis    Arthritis 07/06/2017   Constipation    occ   Dehydration 06/09/2016   Depression    Esophageal reflux 08/08/2012   GERD (gastroesophageal reflux disease)    Hypertension    Inverted nipple    right nipple has always been inverted - per pt   MS (multiple sclerosis) ([place])    Neuromuscular disorder ([place])    MS   Other and unspecified hyperlipidemia 08/08/2012   Thyroid disease     Past Surgical History:  Procedure Laterality Date   COLONOSCOPY     HYSTEROSCOPY  02, 04, 2008   with D&C   ORIF WRIST FRACTURE Right 04/07/2018   Procedure: RIGHT WRIST OPEN REDUCTION INTERNAL FIXATION (ORIF);  Surgeon: Meredith Pel, MD;  Location: [place];  Service: Orthopedics;  Laterality: Right;   TMJ ARTHROSCOPY     TONSILLECTOMY     UPPER GASTROINTESTINAL ENDOSCOPY      There were no vitals filed for this visit.   Subjective Assessment - 10/16/21 0948     Subjective The knees have been very sore, in the same area (right lateral knee)     Currently in Pain? Yes    Pain Score 6     Pain Location Knee    Pain Orientation Right;Lateral    Pain Descriptors / Indicators Sore    Pain Type Chronic pain    Pain Onset More than a month ago                               [facility] Adult PT Treatment/Exercise - 10/16/21 0001       Knee/Hip Exercises: Aerobic   Other Aerobic UBE x 6 min level 1.5 "hills" program for activity tolerance      Knee/Hip Exercises: Standing   Hip Abduction Stengthening;3 sets;10 reps    Abduction Limitations green loop around knees      Knee/Hip Exercises: Seated   Ball Squeeze 3x10    Clamshell with [employer]   5x10   Marching Strengthening;Both;10 reps;3 sets    Marching Limitations green t-loop around feet                       PT Short Term Goals - 09/15/21 1227  PT SHORT TERM GOAL #1   Title be independent in initial HEP    Time 3    Period Weeks    Status New    Target Date 10/06/21      PT SHORT TERM GOAL #2   Title Reduce risk for falls as evidenced by time of 28 sec TUG test    Baseline 34.13 sec with [facility]    Time 3    Period Weeks    Status New    Target Date 10/06/21      PT SHORT TERM GOAL #3   Title improve LE strength to perform sit to stand with single UE support    Baseline heavy reliance BUE    Time 3    Period Weeks    Status New    Target Date 10/06/21               PT Long Term Goals - 09/15/21 1229       PT LONG TERM GOAL #1   Title be independent in advanced HEP    Time 6    Period Weeks    Status New    Target Date 11/10/21      PT LONG TERM GOAL #2   Title Demo improved gait tolerance and speed for community level ambulation to 1.8 ft/sec    Baseline 1.3 ft/sec    Time 6    Period Weeks    Status New    Target Date 11/10/21                   Plan - 10/16/21 1011     Clinical Impression Statement Improved strength and HEP recall able to perform majority of [place] PRE with green t-band  with modicum of effort. Will increase resistance to blue t-band in sessions and use of green band for HEP. Recommend use of rollator for gait to improve balance/support for BLE. Continued sessions to refine HEP and meet POC requirements.    Personal Factors and Comorbidities Age;Time since onset of injury/illness/exacerbation;Past/Current Experience    Examination-Activity Limitations Lift;Stand;Stairs;Squat;Locomotion Level;Transfers    Examination-Participation Restrictions Cleaning;Community Activity;Yard Work;Meal Prep    Stability/Clinical Decision Making Evolving/Moderate complexity    Rehab Potential Good    PT Frequency 2x / week    PT Duration 6 weeks    PT Treatment/Interventions ADLs/Self Care Home Management;Aquatic Therapy;Electrical Stimulation;DME Instruction;Gait training;Stair training;Functional mobility training;Therapeutic activities;Therapeutic exercise;Balance training;Neuromuscular re-education;Manual techniques;Patient/family education;Dry needling;Energy conservation;Taping    PT Next Visit Plan develop general HEP activities    PT Home Exercise Plan proper use of AD, standing hip abd with red loop, sit to stand with loop around knees, heel raise    Consulted and Agree with Plan of Care Patient             Patient will benefit from skilled therapeutic intervention in order to improve the following deficits and impairments:  Abnormal gait, Decreased activity tolerance, Decreased balance, Decreased endurance, Decreased knowledge of use of DME, Decreased mobility, Decreased strength, Difficulty walking, Improper body mechanics, Pain, Obesity  Visit Diagnosis: Unsteadiness on feet  Muscle weakness (generalized)  Difficulty in walking, not elsewhere classified  Other abnormalities of gait and mobility     Problem List Patient Active Problem List   Diagnosis Date Noted   Low vitamin D level 07/14/2021   Polyp of colon 07/14/2021   Urinary incontinence  01/30/2021   Restless leg syndrome 08/03/2019   Attention deficit disorder (ADD) in adult 08/03/2019  Weakness of both lower extremities 07/26/2019   Left shoulder pain 07/26/2019   Hyperglycemia 07/25/2019   Shingles 10/09/2018   Lumbar radiculopathy 08/02/2018   Numbness 08/02/2018   Post-menopausal bleeding 03/17/2018   Dysuria 03/17/2018   High serum parathyroid hormone (PTH) 01/10/2018   Hypercalcemia 01/10/2018   Chronic heel pain, left 07/06/2017   Arthritis 07/06/2017   Osteopenia 07/06/2017   Hx of colonic polyp 07/06/2017   Cervical cancer screening 07/06/2017   Obesity 07/06/2017   Dry eyes, bilateral 10/10/2015   Low-tension glaucoma of both eyes, moderate stage 10/10/2015   Nuclear sclerotic cataract of both eyes 10/10/2015   Partial optic atrophy of left eye 10/10/2015   Insomnia 08/16/2014   Optic neuritis 08/16/2014   Ataxic gait 08/16/2014   Other fatigue 08/16/2014   Abdominal pain 04/03/2014   Colon cancer screening 96/08/5407   Helicobacter pylori (H. pylori) infection 03/21/2014   Preventative health care 08/27/2013   Sinusitis 08/08/2012   Hyperlipidemia, mixed 08/08/2012   Allergy 08/08/2012   Esophageal reflux 08/08/2012   Depression 01/23/2011   Leg swelling 01/23/2011   Multiple sclerosis ([place]) 09/23/2010   Essential hypertension 09/23/2010   Overactive bladder 09/23/2010   Thyroid nodule 09/23/2010   Pulmonary nodule 09/23/2010   Fatigue 09/23/2010    Toniann Fail, PT 10/16/2021, 10:16 AM  [facility] [address], [place], [place], [address] Phone: [phone]   Fax:  [phone]  Name: Sydney Pace MRN: [id number] Date of Birth: [birth year]

## 2021-10-17 NOTE — Telephone Encounter (Signed)
Walker ordered and community message sent to [employer].

## 2021-10-20 ENCOUNTER — Ambulatory Visit: Payer: Medicare (Managed Care)

## 2021-10-20 DIAGNOSIS — R262 Difficulty in walking, not elsewhere classified: Secondary | ICD-10-CM

## 2021-10-20 DIAGNOSIS — R2681 Unsteadiness on feet: Secondary | ICD-10-CM | POA: Diagnosis not present

## 2021-10-20 DIAGNOSIS — M79661 Pain in right lower leg: Secondary | ICD-10-CM

## 2021-10-20 DIAGNOSIS — M6281 Muscle weakness (generalized): Secondary | ICD-10-CM

## 2021-10-20 DIAGNOSIS — G35 Multiple sclerosis: Secondary | ICD-10-CM | POA: Diagnosis not present

## 2021-10-20 DIAGNOSIS — R2689 Other abnormalities of gait and mobility: Secondary | ICD-10-CM

## 2021-10-20 NOTE — Therapy (Signed)
[facility] [address], [place], [place], [address] Phone: [phone]   Fax:  [phone]  Physical Therapy Treatment and Progress Note  Patient Details  Name: Sydney Pace MRN: [id number] Date of Birth: 11/11/1955 Referring Provider (PT): [name], MD  Progress Note Reporting Period 09/15/21 to 10/20/21  See note below for Objective Data and Assessment of Progress/Goals.     Encounter Date: 10/20/2021   PT End of Session - 10/20/21 1106     Visit Number 10    Number of Visits 12    Date for PT Re-Evaluation 11/10/21    Authorization Type [employer] ADvantage 2023    Authorization Time Period after 25th visit    Authorization - Visit Number 10    Authorization - Number of Visits 25    Progress Note Due on Visit 1    PT Start Time 1102    PT Stop Time 1145    PT Time Calculation (min) 43 min    Activity Tolerance Patient tolerated treatment well    Behavior During Therapy [facility] for tasks assessed/performed             Past Medical History:  Diagnosis Date   Allergy    Anemia    Arthritis    Arthritis 07/06/2017   Constipation    occ   Dehydration 06/09/2016   Depression    Esophageal reflux 08/08/2012   GERD (gastroesophageal reflux disease)    Hypertension    Inverted nipple    right nipple has always been inverted - per pt   MS (multiple sclerosis) ([place])    Neuromuscular disorder ([place])    MS   Other and unspecified hyperlipidemia 08/08/2012   Thyroid disease     Past Surgical History:  Procedure Laterality Date   COLONOSCOPY     HYSTEROSCOPY  02, 04, 2008   with D&C   ORIF WRIST FRACTURE Right 04/07/2018   Procedure: RIGHT WRIST OPEN REDUCTION INTERNAL FIXATION (ORIF);  Surgeon: Meredith Pel, MD;  Location: [place];  Service: Orthopedics;  Laterality: Right;   TMJ ARTHROSCOPY     TONSILLECTOMY     UPPER GASTROINTESTINAL ENDOSCOPY      There were no vitals filed for this visit.    Subjective Assessment - 10/20/21 1106     Subjective Has MD f/u in July regarding referral for knee brace and rollator Rx    Currently in Pain? Yes    Pain Score 8     Pain Location Knee    Pain Orientation Right;Lateral    Pain Descriptors / Indicators Sore    Pain Type Chronic pain    Pain Onset More than a month ago                [facility] PT Assessment - 10/20/21 0001       Assessment   Medical Diagnosis MS, lumbar radiculopthy    Referring Provider (PT) [name], MD      Ambulation/Gait   Ambulation/Gait Yes    Ambulation/Gait Assistance 6: Modified independent (Device/Increase time)    Assistive device Rolling walker    Gait velocity 1.76 ft/sec      Timed Up and Go Test   Normal TUG (seconds) 26.69   34.23, 30.69, 26.69                               Balance Exercises -  10/20/21 0001       Balance Exercises: Standing   Standing Eyes Opened Narrow base of support (BOS);Wide (BOA);Head turns;Foam/compliant surface;3 reps;30 secs    Standing Eyes Closed Narrow base of support (BOS);Wide (BOA);Head turns;Foam/compliant surface;3 reps;30 secs    Sidestepping 2 reps   2 min                 PT Short Term Goals - 10/20/21 1132       PT SHORT TERM GOAL #1   Title be independent in initial HEP    Time 3    Period Weeks    Status Achieved    Target Date 10/06/21      PT SHORT TERM GOAL #2   Title Reduce risk for falls as evidenced by time of 28 sec TUG test    Baseline 34.13 sec with WBQC; 26.69 sec with RW on 10/20/21    Time 3    Period Weeks    Status Achieved    Target Date 10/06/21      PT SHORT TERM GOAL #3   Title improve LE strength to perform sit to stand with single UE support    Baseline BUE push-off from arm rests    Time 3    Period Weeks    Status On-going    Target Date 10/06/21               PT Long Term Goals - 10/20/21 1142       PT LONG TERM GOAL #1   Title be independent in advanced HEP     Time 6    Period Weeks    Status On-going    Target Date 11/10/21      PT LONG TERM GOAL #2   Title Demo improved gait tolerance and speed for community level ambulation to 1.8 ft/sec    Baseline 1.3 ft/sec; 1.76 ft/sec with RW on 10/20/21    Time 6    Period Weeks    Status On-going    Target Date 11/10/21                   Plan - 10/20/21 1159     Clinical Impression Statement Patient progressing with POC details and able to meet 2 of 3 STG and progressing with LTG with improved gait speed and TUG test performance with pt using RW at this time vs quad cane.  Pt reports right lateral knee pain/instability continues to be a hinderance causing pain and reduced activity/gait tolerance in her day-to-day life.  Pt reports subjective improvement in transfers from chairs requiring less UE support but still dependent on BUE to successfully transfer. Pt demo independence with initial HEP using resistance bands with activities performed in sitting and supported standing to accommodate LE and therapist encourages her to pursue returning to [facility] for use of strengthening equipment that would be appropriate. Continued sessions to refine HEP and relevant DME recommendations.    Personal Factors and Comorbidities Age;Time since onset of injury/illness/exacerbation;Past/Current Experience    Examination-Activity Limitations Lift;Stand;Stairs;Squat;Locomotion Level;Transfers    Examination-Participation Restrictions Cleaning;Community Activity;Yard Work;Meal Prep    Stability/Clinical Decision Making Evolving/Moderate complexity    Rehab Potential Good    PT Frequency 2x / week    PT Duration 6 weeks    PT Treatment/Interventions ADLs/Self Care Home Management;Aquatic Therapy;Electrical Stimulation;DME Instruction;Gait training;Stair training;Functional mobility training;Therapeutic activities;Therapeutic exercise;Balance training;Neuromuscular re-education;Manual techniques;Patient/family  education;Dry needling;Energy conservation;Taping    PT Next Visit Plan develop general HEP activities  PT Home Exercise Plan proper use of AD, standing hip abd with red loop, sit to stand with loop around knees, heel raise    Consulted and Agree with Plan of Care Patient             Patient will benefit from skilled therapeutic intervention in order to improve the following deficits and impairments:  Abnormal gait, Decreased activity tolerance, Decreased balance, Decreased endurance, Decreased knowledge of use of DME, Decreased mobility, Decreased strength, Difficulty walking, Improper body mechanics, Pain, Obesity  Visit Diagnosis: Unsteadiness on feet  Muscle weakness (generalized)  Difficulty in walking, not elsewhere classified  Other abnormalities of gait and mobility  Pain in right lower leg     Problem List Patient Active Problem List   Diagnosis Date Noted   Low vitamin D level 07/14/2021   Polyp of colon 07/14/2021   Urinary incontinence 01/30/2021   Restless leg syndrome 08/03/2019   Attention deficit disorder (ADD) in adult 08/03/2019   Weakness of both lower extremities 07/26/2019   Left shoulder pain 07/26/2019   Hyperglycemia 07/25/2019   Shingles 10/09/2018   Lumbar radiculopathy 08/02/2018   Numbness 08/02/2018   Post-menopausal bleeding 03/17/2018   Dysuria 03/17/2018   High serum parathyroid hormone (PTH) 01/10/2018   Hypercalcemia 01/10/2018   Chronic heel pain, left 07/06/2017   Arthritis 07/06/2017   Osteopenia 07/06/2017   Hx of colonic polyp 07/06/2017   Cervical cancer screening 07/06/2017   Obesity 07/06/2017   Dry eyes, bilateral 10/10/2015   Low-tension glaucoma of both eyes, moderate stage 10/10/2015   Nuclear sclerotic cataract of both eyes 10/10/2015   Partial optic atrophy of left eye 10/10/2015   Insomnia 08/16/2014   Optic neuritis 08/16/2014   Ataxic gait 08/16/2014   Other fatigue 08/16/2014   Abdominal pain 04/03/2014    Colon cancer screening 65/99/3570   Helicobacter pylori (H. pylori) infection 03/21/2014   Preventative health care 08/27/2013   Sinusitis 08/08/2012   Hyperlipidemia, mixed 08/08/2012   Allergy 08/08/2012   Esophageal reflux 08/08/2012   Depression 01/23/2011   Leg swelling 01/23/2011   Multiple sclerosis ([place]) 09/23/2010   Essential hypertension 09/23/2010   Overactive bladder 09/23/2010   Thyroid nodule 09/23/2010   Pulmonary nodule 09/23/2010   Fatigue 09/23/2010    Toniann Fail, PT 10/20/2021, 12:10 PM  [facility] [address], [place], [place], [address] Phone: [phone]   Fax:  [phone]  Name: Sydney Pace MRN: [id number] Date of Birth: Jan 22, 1956

## 2021-10-23 ENCOUNTER — Ambulatory Visit: Payer: Medicare (Managed Care)

## 2021-10-23 DIAGNOSIS — R2689 Other abnormalities of gait and mobility: Secondary | ICD-10-CM

## 2021-10-23 DIAGNOSIS — M6281 Muscle weakness (generalized): Secondary | ICD-10-CM

## 2021-10-23 DIAGNOSIS — M79661 Pain in right lower leg: Secondary | ICD-10-CM

## 2021-10-23 DIAGNOSIS — R262 Difficulty in walking, not elsewhere classified: Secondary | ICD-10-CM

## 2021-10-23 DIAGNOSIS — R2681 Unsteadiness on feet: Secondary | ICD-10-CM

## 2021-10-23 NOTE — Therapy (Signed)
[facility] [address], [place], [place], [address] Phone: [phone]   Fax:  [phone]  Physical Therapy Treatment  Patient Details  Name: Sydney Pace MRN: [id number] Date of Birth: February 01, 1956 Referring Provider (PT): [name], MD  Rationale for Evaluation and Treatment Rehabilitation  Encounter Date: 10/23/2021   PT End of Session - 10/23/21 0939     Visit Number 11    Number of Visits 12    Date for PT Re-Evaluation 11/10/21    Authorization Type [employer] ADvantage 2023    Authorization Time Period after 25th visit    Authorization - Visit Number 11    Authorization - Number of Visits 25    Progress Note Due on Visit 52    PT Start Time 0932    PT Stop Time 1015    PT Time Calculation (min) 43 min    Activity Tolerance Patient tolerated treatment well    Behavior During Therapy [facility] for tasks assessed/performed             Past Medical History:  Diagnosis Date   Allergy    Anemia    Arthritis    Arthritis 07/06/2017   Constipation    occ   Dehydration 06/09/2016   Depression    Esophageal reflux 08/08/2012   GERD (gastroesophageal reflux disease)    Hypertension    Inverted nipple    right nipple has always been inverted - per pt   MS (multiple sclerosis) ([place])    Neuromuscular disorder ([place])    MS   Other and unspecified hyperlipidemia 08/08/2012   Thyroid disease     Past Surgical History:  Procedure Laterality Date   COLONOSCOPY     HYSTEROSCOPY  02, 04, 2008   with D&C   ORIF WRIST FRACTURE Right 04/07/2018   Procedure: RIGHT WRIST OPEN REDUCTION INTERNAL FIXATION (ORIF);  Surgeon: Meredith Pel, MD;  Location: [place];  Service: Orthopedics;  Laterality: Right;   TMJ ARTHROSCOPY     TONSILLECTOMY     UPPER GASTROINTESTINAL ENDOSCOPY      There were no vitals filed for this visit.   Subjective Assessment - 10/23/21 0950     Subjective received rollator on Monday     Currently in Pain? Yes    Pain Score 6     Pain Location Knee    Pain Orientation Right;Lateral    Pain Descriptors / Indicators Sore    Pain Type Chronic pain    Pain Onset More than a month ago                               [facility] Adult PT Treatment/Exercise - 10/23/21 0001       Ambulation/Gait   Ambulation/Gait Yes    Ambulation/Gait Assistance 6: Modified independent (Device/Increase time)    Ambulation Distance (Feet) 200 Feet    Assistive device 4-wheeled walker    Ambulation Surface Level;Outdoor;Other (comment)   ramp   Pre-Gait Activities pt new rollator assembled by therapist and adjusted to height for support    Gait Comments gait training over various surfaces and negotiating outdoors and training in folding rollator and placing in/ou of vehicle      Knee/Hip Exercises: Standing   Hip Abduction Stengthening;Both;3 sets;10 reps    Abduction Limitations doubled blue      Knee/Hip Exercises: Seated   Ball Squeeze 4x10  Clamshell with TheraBand Blue   doubled                Balance Exercises - 10/23/21 0001       Balance Exercises: Standing   Standing Eyes Opened Narrow base of support (BOS);Wide (BOA);Head turns;Foam/compliant surface;3 reps;30 secs    Standing Eyes Closed Narrow base of support (BOS);Wide (BOA);Head turns;Foam/compliant surface;3 reps;30 secs                  PT Short Term Goals - 10/20/21 1132       PT SHORT TERM GOAL #1   Title be independent in initial HEP    Time 3    Period Weeks    Status Achieved    Target Date 10/06/21      PT SHORT TERM GOAL #2   Title Reduce risk for falls as evidenced by time of 28 sec TUG test    Baseline 34.13 sec with WBQC; 26.69 sec with RW on 10/20/21    Time 3    Period Weeks    Status Achieved    Target Date 10/06/21      PT SHORT TERM GOAL #3   Title improve LE strength to perform sit to stand with single UE support    Baseline BUE push-off from arm rests     Time 3    Period Weeks    Status On-going    Target Date 10/06/21               PT Long Term Goals - 10/20/21 1142       PT LONG TERM GOAL #1   Title be independent in advanced HEP    Time 6    Period Weeks    Status On-going    Target Date 11/10/21      PT LONG TERM GOAL #2   Title Demo improved gait tolerance and speed for community level ambulation to 1.8 ft/sec    Baseline 1.3 ft/sec; 1.76 ft/sec with RW on 10/20/21    Time 6    Period Weeks    Status On-going    Target Date 11/10/21                   Plan - 10/23/21 1027     Clinical Impression Statement Reports improved comfort with rollator for ambulation vs other devices. Demonstrating good HEP recall and improved static standing balance on compliant surfaces with reduced postural sway noted. Next visit will comprise D/C summary and relevant recommendations.    Personal Factors and Comorbidities Age;Time since onset of injury/illness/exacerbation;Past/Current Experience    Examination-Activity Limitations Lift;Stand;Stairs;Squat;Locomotion Level;Transfers    Examination-Participation Restrictions Cleaning;Community Activity;Yard Work;Meal Prep    Stability/Clinical Decision Making Evolving/Moderate complexity    Rehab Potential Good    PT Frequency 2x / week    PT Duration 6 weeks    PT Treatment/Interventions ADLs/Self Care Home Management;Aquatic Therapy;Electrical Stimulation;DME Instruction;Gait training;Stair training;Functional mobility training;Therapeutic activities;Therapeutic exercise;Balance training;Neuromuscular re-education;Manual techniques;Patient/family education;Dry needling;Energy conservation;Taping    PT Next Visit Plan develop general HEP activities    PT Home Exercise Plan proper use of AD, standing hip abd with red loop, sit to stand with loop around knees, heel raise    Consulted and Agree with Plan of Care Patient             Patient will benefit from skilled therapeutic  intervention in order to improve the following deficits and impairments:  Abnormal gait, Decreased activity tolerance, Decreased balance,  Decreased endurance, Decreased knowledge of use of DME, Decreased mobility, Decreased strength, Difficulty walking, Improper body mechanics, Pain, Obesity  Visit Diagnosis: Unsteadiness on feet  Muscle weakness (generalized)  Difficulty in walking, not elsewhere classified  Other abnormalities of gait and mobility  Pain in right lower leg     Problem List Patient Active Problem List   Diagnosis Date Noted   Low vitamin D level 07/14/2021   Polyp of colon 07/14/2021   Urinary incontinence 01/30/2021   Restless leg syndrome 08/03/2019   Attention deficit disorder (ADD) in adult 08/03/2019   Weakness of both lower extremities 07/26/2019   Left shoulder pain 07/26/2019   Hyperglycemia 07/25/2019   Shingles 10/09/2018   Lumbar radiculopathy 08/02/2018   Numbness 08/02/2018   Post-menopausal bleeding 03/17/2018   Dysuria 03/17/2018   High serum parathyroid hormone (PTH) 01/10/2018   Hypercalcemia 01/10/2018   Chronic heel pain, left 07/06/2017   Arthritis 07/06/2017   Osteopenia 07/06/2017   Hx of colonic polyp 07/06/2017   Cervical cancer screening 07/06/2017   Obesity 07/06/2017   Dry eyes, bilateral 10/10/2015   Low-tension glaucoma of both eyes, moderate stage 10/10/2015   Nuclear sclerotic cataract of both eyes 10/10/2015   Partial optic atrophy of left eye 10/10/2015   Insomnia 08/16/2014   Optic neuritis 08/16/2014   Ataxic gait 08/16/2014   Other fatigue 08/16/2014   Abdominal pain 04/03/2014   Colon cancer screening 10/93/2355   Helicobacter pylori (H. pylori) infection 03/21/2014   Preventative health care 08/27/2013   Sinusitis 08/08/2012   Hyperlipidemia, mixed 08/08/2012   Allergy 08/08/2012   Esophageal reflux 08/08/2012   Depression 01/23/2011   Leg swelling 01/23/2011   Multiple sclerosis ([place]) 09/23/2010    Essential hypertension 09/23/2010   Overactive bladder 09/23/2010   Thyroid nodule 09/23/2010   Pulmonary nodule 09/23/2010   Fatigue 09/23/2010    Toniann Fail, PT 10/23/2021, 10:28 AM  [facility] [address] [place], [place], [address] Phone: [phone]   Fax:  [phone]  Name: Bellami Farrelly MRN: [id number] Date of Birth: [birth year]

## 2021-10-28 ENCOUNTER — Ambulatory Visit: Payer: Medicare (Managed Care)

## 2021-10-30 ENCOUNTER — Ambulatory Visit: Payer: Medicare (Managed Care) | Admitting: Neurology

## 2021-10-31 ENCOUNTER — Ambulatory Visit: Payer: Medicare (Managed Care) | Attending: Family Medicine

## 2021-10-31 ENCOUNTER — Telehealth: Payer: Self-pay

## 2021-10-31 NOTE — Telephone Encounter (Signed)
Called, no answer.  10:08 AM, 10/31/21 M. Sherlyn Lees, PT, DPT Physical Therapist- [place] Office Number: [phone]

## 2021-11-04 ENCOUNTER — Other Ambulatory Visit: Payer: Self-pay | Admitting: Family Medicine

## 2021-11-05 ENCOUNTER — Telehealth: Payer: Self-pay | Admitting: Family Medicine

## 2021-11-05 ENCOUNTER — Encounter: Payer: Self-pay | Admitting: Neurology

## 2021-11-05 ENCOUNTER — Ambulatory Visit: Payer: Medicare (Managed Care) | Admitting: Neurology

## 2021-11-05 VITALS — BP 143/88 | HR 84 | Ht 66.5 in | Wt 276.5 lb

## 2021-11-05 DIAGNOSIS — N3281 Overactive bladder: Secondary | ICD-10-CM

## 2021-11-05 DIAGNOSIS — R260 Ataxic gait: Secondary | ICD-10-CM | POA: Diagnosis not present

## 2021-11-05 DIAGNOSIS — G2581 Restless legs syndrome: Secondary | ICD-10-CM | POA: Diagnosis not present

## 2021-11-05 DIAGNOSIS — F32A Depression, unspecified: Secondary | ICD-10-CM | POA: Diagnosis not present

## 2021-11-05 DIAGNOSIS — G35 Multiple sclerosis: Secondary | ICD-10-CM

## 2021-11-05 DIAGNOSIS — F988 Other specified behavioral and emotional disorders with onset usually occurring in childhood and adolescence: Secondary | ICD-10-CM

## 2021-11-05 MED ORDER — METHYLPHENIDATE HCL 20 MG PO TABS: ORAL_TABLET | ORAL | 0 refills | Status: DC

## 2021-11-05 NOTE — Telephone Encounter (Signed)
Left message for patient to call back and schedule Medicare Annual Wellness Visit (AWV).   Please offer to do virtually or by telephone.  Left office number and my jabber [phone].  Last AWV:11/29/2017  Please schedule at anytime with Nurse Health Advisor.

## 2021-11-05 NOTE — Progress Notes (Signed)
u  [facility]  PATIENT: Sydney Pace DOB: [birth year]  REFERRING DOCTOR OR PCP:  Penni Homans  _________________________________   HISTORICAL  CHIEF COMPLAINT:  Chief Complaint  Patient presents with   Follow-up    RM 1, alone. Here to f/u for MS. Pt has been off gabapentin for over a year. Pt continues to struggle w depression. Pt has been walking better since using her walker last week. R leg is giving her more trouble.      HISTORY OF PRESENT ILLNESS:  Sydney Pace is a 66 y.o. woman with multiple sclerosis.      Update 11/05/2021 She feels her MS is stable though she has slightly progressed.    She has not had any MS exacerbations and has been off DMTs since late 2014   MRIs between 2007 and 2018 were stable.      Gait is off balanced.  She just started a Rollator style walker and feels more stable than using a cane.     She reports her legs are feeling worse.    Pain is dysesthetic, worse in R > L leg.      She has mild urinary hesitancy, not troubling, and no recent UTI's. Urgency helped by oxybutynin.    Vision is stable.     She continues to note a lot of depression.  She is on [place] and Zoloft.  She used to do counseling more than 4 years ago but no one recently.   These have helped but incompletely.  She does not note anxiety or agitation.    She was unable to get an appt for behavioral health.     She has poor focus and attention.   She feels it is and it is  helped a lot by methylphenidate.    She tolerates it well.   She takes at least one every day and two some days.     She has insomnia.   The RLS is doing better.   She takes gabapentin 300 mg at bedtime.     We discussed doing more walking and exercise.   [facility] to do so.      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  MRI was performed in 1983 and was consistent with MS.     In the late  1990's, she was started on Copaxone but stopped after several weeks due to skin reactions. She also tried Avonex but had a rash and stopped. She started Gilenya in January 2014 but stopped after 7 or 8 months due to being more fatigue and having flulike symptoms. In May 2015 we had discussed Aubagio. She decided not to start.     MRI 02/28/2017 showed Multiple T2/FLAIR hyperintense foci in the hemispheres and thalamus in a pattern and configuration consistent with chronic demyelinating plaque associated with multiple sclerosis. None of the foci appears to be acute. When compared to the MRI dated 04/03/2006, there is no significant interval change.      Generalized cortical atrophy and corpus callosum atrophy  REVIEW OF SYSTEMS: Constitutional: No fevers, chills, sweats, or change in appetite.  She has fatigue and insomnia Eyes: see above.  No double vision, eye pain..  Some eye redness Ear, nose and throat: No hearing loss, ear pain, nasal congestion, sore throat Cardiovascular: No chest pain, palpitations Respiratory:  No shortness of breath at  rest or with exertion.   No wheezes GastrointestinaI: No nausea, vomiting, diarrhea, abdominal pain, fecal incontinence Genitourinary:  Mild urinary frequency.  No nocturia. Musculoskeletal:  No neck pain, back pain.  Some hip pain Integumentary: No rash, pruritus, skin lesions Neurological: as above Psychiatric: Mild depression at this time, rarely cries, less anxiety Endocrine: No palpitations, diaphoresis, change in appetite, change in weigh or increased thirst Hematologic/Lymphatic:  No anemia, purpura, petechiae. Allergic/Immunologic: No itchy/runny eyes, nasal congestion, recent allergic reactions, rashes  ALLERGIES: Allergies  Allergen Reactions   Shellfish Allergy Hives, Shortness Of Breath, Itching, Swelling and Rash    Pt had previously carried an epi-pen and had a history of severe reaction to shrimp with breathing problems and swelling of  lips and tongue     HOME MEDICATIONS:  Current Outpatient Medications:    ALPRAZolam (XANAX) 0.25 MG tablet, Take 1 tablet (0.25 mg total) by mouth 2 (two) times daily as needed for anxiety., Disp: 20 tablet, Rfl: 1   AZO-CRANBERRY PO, Take by mouth., Disp: , Rfl:    brimonidine (ALPHAGAN) 0.2 % ophthalmic solution, Place 1 drop into both eyes 2 (two) times daily., Disp: , Rfl:    buPROPion (WELLBUTRIN XL) 300 MG 24 hr tablet, Take 1 tablet by mouth once daily, Disp: 90 tablet, Rfl: 0   Carboxymethylcellul-Glycerin (LUBRICATING EYE DROPS OP), Place 1 drop into both eyes daily as needed (dry eyes)., Disp: , Rfl:    cetirizine (ZYRTEC) 10 MG tablet, Take 10 mg by mouth every evening. , Disp: , Rfl:    Cholecalciferol (DIALYVITE VITAMIN D 5000 PO), Take 4,000 Units by mouth daily. , Disp: , Rfl:    COVID-19 mRNA bivalent vaccine, Pfizer, (PFIZER COVID-19 VAC BIVALENT) injection, Inject into the muscle., Disp: 0.3 mL, Rfl: 0   fluticasone (FLONASE) 50 MCG/ACT nasal spray, USE 2 SPRAY(S) IN EACH NOSTRIL ONCE DAILY AS NEEDED FOR ALLERGIES OR  RHINITIS, Disp: 16 g, Rfl: 0   furosemide (LASIX) 20 MG tablet, Take 1 tablet (20 mg total) by mouth daily as needed., Disp: 90 tablet, Rfl: 1   gabapentin (NEURONTIN) 100 MG capsule, TAKE 1 CAPSULE BY MOUTH THREE TIMES DAILY, Disp: 90 capsule, Rfl: 0   lisinopril-hydrochlorothiazide (ZESTORETIC) 20-25 MG tablet, Take 1 tablet by mouth once daily, Disp: 90 tablet, Rfl: 0   methylphenidate (RITALIN) 20 MG tablet, One po qAM and one po qNoon, Disp: 60 tablet, Rfl: 0   Multiple Vitamin (MULTIVITAMIN) tablet, Take 1 tablet by mouth daily., Disp: , Rfl:    naproxen sodium (ALEVE) 220 MG tablet, Take 220 mg by mouth daily as needed (pain)., Disp: , Rfl:    Probiotic Product (PROBIOTIC DAILY PO), Take by mouth., Disp: , Rfl:    sertraline (ZOLOFT) 100 MG tablet, Take 1.5 tablets (150 mg total) by mouth daily., Disp: 135 tablet, Rfl: 1   solifenacin (VESICARE) 5 MG  tablet, Take 1 tablet by mouth once daily, Disp: 30 tablet, Rfl: 2   triamcinolone (KENALOG) 0.1 %, APPLY 1 APPLICATION OF CREAM TWICE DAILY, Disp: 30 g, Rfl: 0  PAST MEDICAL HISTORY: Past Medical History:  Diagnosis Date   Allergy    Anemia    Arthritis    Arthritis 07/06/2017   Constipation    occ   Dehydration 06/09/2016   Depression    Esophageal reflux 08/08/2012   GERD (gastroesophageal reflux disease)    Hypertension    Inverted nipple    right nipple has always been inverted - per pt   MS (  multiple sclerosis) ([place])    Neuromuscular disorder ([place])    MS   Other and unspecified hyperlipidemia 08/08/2012   Thyroid disease     PAST SURGICAL HISTORY: Past Surgical History:  Procedure Laterality Date   COLONOSCOPY     HYSTEROSCOPY  02, 04, 2008   with D&C   ORIF WRIST FRACTURE Right 04/07/2018   Procedure: RIGHT WRIST OPEN REDUCTION INTERNAL FIXATION (ORIF);  Surgeon: Meredith Pel, MD;  Location: [place];  Service: Orthopedics;  Laterality: Right;   TMJ ARTHROSCOPY     TONSILLECTOMY     UPPER GASTROINTESTINAL ENDOSCOPY      FAMILY HISTORY: Family History  Problem Relation Age of Onset   Heart disease Mother        pacemaker   Emphysema Mother    Hypertension Mother    Heart disease Father    Diabetes Father    Heart disease Sister        cad   Sleep apnea Brother    Colon cancer Neg Hx    Esophageal cancer Neg Hx    Rectal cancer Neg Hx    Stomach cancer Neg Hx    Colon polyps Neg Hx     SOCIAL HISTORY:  Social History   Socioeconomic History   Marital status: Single    Spouse name: Not on file   Number of children: Not on file   Years of education: Not on file   Highest education level: Not on file  Occupational History   Not on file  Tobacco Use   Smoking status: Never   Smokeless tobacco: Never  Vaping Use   Vaping Use: Never used  Substance and Sexual Activity   Alcohol use: Yes    Alcohol/week: 0.0 standard drinks    Comment:  occassional   Drug use: No   Sexual activity: Never    Comment: 1st intercourse 40 yo-5 partners  Other Topics Concern   Not on file  Social History Narrative   Not on file   Social Determinants of Health   Financial Resource Strain: Not on file  Food Insecurity: Not on file  Transportation Needs: Not on file  Physical Activity: Not on file  Stress: Not on file  Social Connections: Not on file  Intimate Partner Violence: Not on file     PHYSICAL EXAM  Vitals:   11/05/21 1451  BP: (!) 143/88  Pulse: 84  Weight: 276 lb 8 oz (125.4 kg)  Height: 5' 6.5" (1.689 m)    Body mass index is 43.96 kg/m.   General: The patient is well-developed and well-nourished and in no acute distress  Neurologic Exam  Mental status: The patient is alert and oriented x 3 at the time of the examination. The patient has apparent normal recent and remote memory, with an apparently normal attention span and concentration ability.   Speech is normal.  Cranial nerves: Extraocular movements are full.  She has reduced color vision on the left.  Facial strength was normal.   No dysarthria is noted.   Hearing seems normal.   Motor:  Muscle bulk is normal.  Strength is 5/5 except for 4/5 strength in the ulnar innervated hand muscles and 4+/5 in the EHL muscles.  Sensory: Symmetric sensation in proximal arms to touch, temp and vibration.  But reduced left leg temperature sensation.   Coordination: Cerebellar testing reveals good finger-nose-finger bilaterally.  Gait and station: Station is normal but she needs to use her arms to stand up and  has hip/leg pain.  She has a reduced stride and mildly wide gait.  With her walker her stride is much better.  She cannot do a tandem walk.  Romberg is negative.  Reflexes: Deep tendon reflexes are symmetric and normal in arms, 3+ at knees with mild spread and 2 at the ankles.Marland Kitchen        DIAGNOSTIC DATA (LABS, IMAGING, TESTING) - I reviewed patient records, labs,  notes, testing and imaging myself where available.  Lab Results  Component Value Date   WBC 6.0 07/10/2021   HGB 12.6 07/10/2021   HCT 38.0 07/10/2021   MCV 90.6 07/10/2021   PLT 233.0 07/10/2021      Component Value Date/Time   NA 139 07/10/2021 1136   K 4.3 07/10/2021 1136   CL 101 07/10/2021 1136   CO2 33 (H) 07/10/2021 1136   GLUCOSE 87 07/10/2021 1136   BUN 16 07/10/2021 1136   CREATININE 0.97 07/10/2021 1136   CREATININE 0.91 04/22/2016 1730   CALCIUM 10.5 07/10/2021 1136   PROT 7.5 07/10/2021 1136   ALBUMIN 3.9 07/10/2021 1136   AST 20 07/10/2021 1136   ALT 17 07/10/2021 1136   ALKPHOS 98 07/10/2021 1136   BILITOT 0.6 07/10/2021 1136   GFRNONAA 53 (L) 04/07/2018 1330   GFRAA >60 04/07/2018 1330   Lab Results  Component Value Date   CHOL 164 07/10/2021   HDL 61.50 07/10/2021   LDLCALC 93 07/10/2021   TRIG 44.0 07/10/2021   CHOLHDL 3 07/10/2021   Lab Results  Component Value Date   HGBA1C 5.7 07/10/2021   Lab Results  Component Value Date   QXIHWTUU82 800 07/25/2019   Lab Results  Component Value Date   TSH 0.51 07/10/2021       ASSESSMENT AND PLAN  Multiple sclerosis (HCC)  Ataxic gait  Overactive bladder  Attention deficit disorder (ADD) in adult  Depression, unspecified depression type  Restless leg syndrome   1.   She has been mostly stable ad will stay off of an MS DMT.  She prefers not to do another MRI (previous MRi 2018)  2.   Continue Ritalin for attention, wakefulness and fatigue 3.   Continue gabapentin for restless leg syndrome.  Continue Zoloft and Wellbutrin for her mood.   4.   Continue Vit D supplements and stay active. 5.   Stay active.   She is walking better with the rollator than she did with a cane and will continue.   6.   She will return to see [place] in 6 months or sooner if she has new or worsening neurologic symptoms.   Dannie Woolen A. Felecia Shelling, MD, PhD 08/02/9177, 1:50 PM Certified in Neurology, Clinical Neurophysiology,  Sleep Medicine, Pain Medicine and Neuroimaging  [facility] [address] [place], [place] [address] [phone]

## 2021-11-10 ENCOUNTER — Ambulatory Visit (INDEPENDENT_AMBULATORY_CARE_PROVIDER_SITE_OTHER): Payer: Medicare (Managed Care)

## 2021-11-10 DIAGNOSIS — Z0000 Encounter for general adult medical examination without abnormal findings: Secondary | ICD-10-CM | POA: Diagnosis not present

## 2021-11-10 NOTE — Patient Instructions (Signed)
Sydney Pace , Thank you for taking time to come for your Medicare Wellness Visit. I appreciate your ongoing commitment to your health goals. Please review the following plan we discussed and let me know if I can assist you in the future.   Screening recommendations/referrals: Colonoscopy: declined Mammogram: declined Bone Density: declined Recommended yearly ophthalmology/optometry visit for glaucoma screening and checkup Recommended yearly dental visit for hygiene and checkup  Vaccinations: Influenza vaccine: up to date Pneumococcal vaccine: up to date Tdap vaccine: up to date Shingles vaccine: Due-May obtain vaccine at your local pharmacy.    Covid-19:completed  Advanced directives: no  Conditions/risks identified: see problem list   Next appointment: Follow up in one year for your annual wellness visit    Preventive Care 65 Years and Older, Female Preventive care refers to lifestyle choices and visits with your health care provider that can promote health and wellness. What does preventive care include? A yearly physical exam. This is also called an annual well check. Dental exams once or twice a year. Routine eye exams. Ask your health care provider how often you should have your eyes checked. Personal lifestyle choices, including: Daily care of your teeth and gums. Regular physical activity. Eating a healthy diet. Avoiding tobacco and drug use. Limiting alcohol use. Practicing safe sex. Taking low-dose aspirin every day. Taking vitamin and mineral supplements as recommended by your health care provider. What happens during an annual well check? The services and screenings done by your health care provider during your annual well check will depend on your age, overall health, lifestyle risk factors, and family history of disease. Counseling  Your health care provider may ask you questions about your: Alcohol use. Tobacco use. Drug use. Emotional well-being. Home and  relationship well-being. Sexual activity. Eating habits. History of falls. Memory and ability to understand (cognition). Work and work Statistician. Reproductive health. Screening  You may have the following tests or measurements: Height, weight, and BMI. Blood pressure. Lipid and cholesterol levels. These may be checked every 5 years, or more frequently if you are over 40 years old. Skin check. Lung cancer screening. You may have this screening every year starting at age 22 if you have a 30-pack-year history of smoking and currently smoke or have quit within the past 15 years. Fecal occult blood test (FOBT) of the stool. You may have this test every year starting at age 47. Flexible sigmoidoscopy or colonoscopy. You may have a sigmoidoscopy every 5 years or a colonoscopy every 10 years starting at age 30. Hepatitis C blood test. Hepatitis B blood test. Sexually transmitted disease (STD) testing. Diabetes screening. This is done by checking your blood sugar (glucose) after you have not eaten for a while (fasting). You may have this done every 1-3 years. Bone density scan. This is done to screen for osteoporosis. You may have this done starting at age 60. Mammogram. This may be done every 1-2 years. Talk to your health care provider about how often you should have regular mammograms. Talk with your health care provider about your test results, treatment options, and if necessary, the need for more tests. Vaccines  Your health care provider may recommend certain vaccines, such as: Influenza vaccine. This is recommended every year. Tetanus, diphtheria, and acellular pertussis (Tdap, Td) vaccine. You may need a Td booster every 10 years. Zoster vaccine. You may need this after age 80. Pneumococcal 13-valent conjugate (PCV13) vaccine. One dose is recommended after age 67. Pneumococcal polysaccharide (PPSV23) vaccine. One dose is  recommended after age 38. Talk to your health care provider  about which screenings and vaccines you need and how often you need them. This information is not intended to replace advice given to you by your health care provider. Make sure you discuss any questions you have with your health care provider. Document Released: 06/14/2015 Document Revised: 02/05/2016 Document Reviewed: 03/19/2015 Elsevier Interactive Patient Education  2017 [place] Prevention in the Home Falls can cause injuries. They can happen to people of all ages. There are many things you can do to make your home safe and to help prevent falls. What can I do on the outside of my home? Regularly fix the edges of walkways and driveways and fix any cracks. Remove anything that might make you trip as you walk through a door, such as a raised step or threshold. Trim any bushes or trees on the path to your home. Use bright outdoor lighting. Clear any walking paths of anything that might make someone trip, such as rocks or tools. Regularly check to see if handrails are loose or broken. Make sure that both sides of any steps have handrails. Any raised decks and porches should have guardrails on the edges. Have any leaves, snow, or ice cleared regularly. Use sand or salt on walking paths during winter. Clean up any spills in your garage right away. This includes oil or grease spills. What can I do in the bathroom? Use night lights. Install grab bars by the toilet and in the tub and shower. Do not use towel bars as grab bars. Use non-skid mats or decals in the tub or shower. If you need to sit down in the shower, use a plastic, non-slip stool. Keep the floor dry. Clean up any water that spills on the floor as soon as it happens. Remove soap buildup in the tub or shower regularly. Attach bath mats securely with double-sided non-slip rug tape. Do not have throw rugs and other things on the floor that can make you trip. What can I do in the bedroom? Use night lights. Make sure  that you have a light by your bed that is easy to reach. Do not use any sheets or blankets that are too big for your bed. They should not hang down onto the floor. Have a firm chair that has side arms. You can use this for support while you get dressed. Do not have throw rugs and other things on the floor that can make you trip. What can I do in the kitchen? Clean up any spills right away. Avoid walking on wet floors. Keep items that you use a lot in easy-to-reach places. If you need to reach something above you, use a strong step stool that has a grab bar. Keep electrical cords out of the way. Do not use floor polish or wax that makes floors slippery. If you must use wax, use non-skid floor wax. Do not have throw rugs and other things on the floor that can make you trip. What can I do with my stairs? Do not leave any items on the stairs. Make sure that there are handrails on both sides of the stairs and use them. Fix handrails that are broken or loose. Make sure that handrails are as long as the stairways. Check any carpeting to make sure that it is firmly attached to the stairs. Fix any carpet that is loose or worn. Avoid having throw rugs at the top or bottom of the stairs. If  you do have throw rugs, attach them to the floor with carpet tape. Make sure that you have a light switch at the top of the stairs and the bottom of the stairs. If you do not have them, ask someone to add them for you. What else can I do to help prevent falls? Wear shoes that: Do not have high heels. Have rubber bottoms. Are comfortable and fit you well. Are closed at the toe. Do not wear sandals. If you use a stepladder: Make sure that it is fully opened. Do not climb a closed stepladder. Make sure that both sides of the stepladder are locked into place. Ask someone to hold it for you, if possible. Clearly mark and make sure that you can see: Any grab bars or handrails. First and last steps. Where the edge of  each step is. Use tools that help you move around (mobility aids) if they are needed. These include: Canes. Walkers. Scooters. Crutches. Turn on the lights when you go into a dark area. Replace any light bulbs as soon as they burn out. Set up your furniture so you have a clear path. Avoid moving your furniture around. If any of your floors are uneven, fix them. If there are any pets around you, be aware of where they are. Review your medicines with your doctor. Some medicines can make you feel dizzy. This can increase your chance of falling. Ask your doctor what other things that you can do to help prevent falls. This information is not intended to replace advice given to you by your health care provider. Make sure you discuss any questions you have with your health care provider. Document Released: 03/14/2009 Document Revised: 10/24/2015 Document Reviewed: 06/22/2014 Elsevier Interactive Patient Education  2017 [employer].

## 2021-11-10 NOTE — Progress Notes (Signed)
Subjective:   Sydney Pace is a 66 y.o. female who presents for Medicare Annual (Subsequent) preventive examination.  I connected with  Sydney Pace on 11/10/21 by a audio enabled telemedicine application and verified that I am speaking with the correct person using two identifiers.  Patient Location: Home  Provider Location: Office/Clinic  I discussed the limitations of evaluation and management by telemedicine. The patient expressed understanding and agreed to proceed.   Review of Systems     Cardiac Risk Factors include: diabetes mellitus;dyslipidemia     Objective:    Today's Vitals   11/10/21 0917  PainSc: 0-No pain   There is no height or weight on file to calculate BMI.     11/10/2021    9:12 AM 05/30/2020    8:34 AM 08/09/2019   11:22 AM 07/08/2018    2:52 PM 04/07/2018    1:28 PM 11/29/2017    9:27 AM 11/27/2016    9:38 AM  Advanced Directives  Does Patient Have a Medical Advance Directive? No No No No No No No  Would patient like information on creating a medical advance directive? No - Patient declined No - Patient declined No - Patient declined No - Patient declined No - Patient declined No - Patient declined Yes (MAU/Ambulatory/Procedural Areas - Information given)    Current Medications (verified) Outpatient Encounter Medications as of 11/10/2021  Medication Sig   ALPRAZolam (XANAX) 0.25 MG tablet Take 1 tablet (0.25 mg total) by mouth 2 (two) times daily as needed for anxiety.   AZO-CRANBERRY PO Take by mouth.   brimonidine (ALPHAGAN) 0.2 % ophthalmic solution Place 1 drop into both eyes 2 (two) times daily.   buPROPion (WELLBUTRIN XL) 300 MG 24 hr tablet Take 1 tablet by mouth once daily   Carboxymethylcellul-Glycerin (LUBRICATING EYE DROPS OP) Place 1 drop into both eyes daily as needed (dry eyes).   cetirizine (ZYRTEC) 10 MG tablet Take 10 mg by mouth every evening.    Cholecalciferol (DIALYVITE VITAMIN D 5000 PO) Take 4,000 Units by mouth daily.     COVID-19 mRNA bivalent vaccine, Pfizer, (PFIZER COVID-19 VAC BIVALENT) injection Inject into the muscle.   fluticasone (FLONASE) 50 MCG/ACT nasal spray USE 2 SPRAY(S) IN EACH NOSTRIL ONCE DAILY AS NEEDED FOR ALLERGIES OR  RHINITIS   furosemide (LASIX) 20 MG tablet Take 1 tablet (20 mg total) by mouth daily as needed.   gabapentin (NEURONTIN) 100 MG capsule TAKE 1 CAPSULE BY MOUTH THREE TIMES DAILY   lisinopril-hydrochlorothiazide (ZESTORETIC) 20-25 MG tablet Take 1 tablet by mouth once daily   methylphenidate (RITALIN) 20 MG tablet One po qAM and one po qNoon   Multiple Vitamin (MULTIVITAMIN) tablet Take 1 tablet by mouth daily.   naproxen sodium (ALEVE) 220 MG tablet Take 220 mg by mouth daily as needed (pain).   Probiotic Product (PROBIOTIC DAILY PO) Take by mouth.   sertraline (ZOLOFT) 100 MG tablet Take 1.5 tablets (150 mg total) by mouth daily.   solifenacin (VESICARE) 5 MG tablet Take 1 tablet by mouth once daily   triamcinolone (KENALOG) 0.1 % APPLY 1 APPLICATION OF CREAM TWICE DAILY   No facility-administered encounter medications on file as of 11/10/2021.    Allergies (verified) Shellfish allergy   History: Past Medical History:  Diagnosis Date   Allergy    Anemia    Arthritis    Arthritis 07/06/2017   Constipation    occ   Dehydration 06/09/2016   Depression    Esophageal reflux 08/08/2012  GERD (gastroesophageal reflux disease)    Hypertension    Inverted nipple    right nipple has always been inverted - per pt   MS (multiple sclerosis) ([place])    Neuromuscular disorder ([place])    MS   Other and unspecified hyperlipidemia 08/08/2012   Thyroid disease    Past Surgical History:  Procedure Laterality Date   COLONOSCOPY     HYSTEROSCOPY  02, 04, 2008   with D&C   ORIF WRIST FRACTURE Right 04/07/2018   Procedure: RIGHT WRIST OPEN REDUCTION INTERNAL FIXATION (ORIF);  Surgeon: Meredith Pel, MD;  Location: [facility];  Service: Orthopedics;  Laterality: Right;   TMJ  ARTHROSCOPY     TONSILLECTOMY     UPPER GASTROINTESTINAL ENDOSCOPY     Family History  Problem Relation Age of Onset   Heart disease Mother        pacemaker   Emphysema Mother    Hypertension Mother    Heart disease Father    Diabetes Father    Heart disease Sister        cad   Sleep apnea Brother    Colon cancer Neg Hx    Esophageal cancer Neg Hx    Rectal cancer Neg Hx    Stomach cancer Neg Hx    Colon polyps Neg Hx    Social History   Socioeconomic History   Marital status: Single    Spouse name: Not on file   Number of children: Not on file   Years of education: Not on file   Highest education level: Not on file  Occupational History   Not on file  Tobacco Use   Smoking status: Never   Smokeless tobacco: Never  Vaping Use   Vaping Use: Never used  Substance and Sexual Activity   Alcohol use: Yes    Alcohol/week: 0.0 standard drinks of alcohol    Comment: occassional   Drug use: No   Sexual activity: Never    Comment: 1st intercourse 55 yo-5 partners  Other Topics Concern   Not on file  Social History Narrative   Not on file   Social Determinants of Health   Financial Resource Strain: Low Risk  (11/10/2021)   Overall Financial Resource Strain (CARDIA)    Difficulty of Paying Living Expenses: Not hard at all  Food Insecurity: No Food Insecurity (11/10/2021)   Hunger Vital Sign    Worried About Running Out of Food in the Last Year: Never true    [place] in the Last Year: Never true  Transportation Needs: No Transportation Needs (11/10/2021)   PRAPARE - Hydrologist (Medical): No    Lack of Transportation (Non-Medical): No  Physical Activity: Not on file  Stress: No Stress Concern Present (11/10/2021)   [place]    Feeling of Stress : Not at all  Social Connections: Socially Isolated (11/10/2021)   Social Connection and Isolation Panel [NHANES]     Frequency of Communication with Friends and Family: Once a week    Frequency of Social Gatherings with Friends and Family: Never    Attends Religious Services: Never    Marine scientist or Organizations: No    Attends Music therapist: Never    Marital Status: Never married    Tobacco Counseling Counseling given: Not Answered   Clinical Intake:  Pre-visit preparation completed: Yes  Pain : No/denies pain Pain Score: 0-No pain  BMI - recorded: 43.96 Nutritional Status: BMI > 30  Obese Nutritional Risks: None Diabetes: No  How often do you need to have someone help you when you read instructions, pamphlets, or other written materials from your doctor or pharmacy?: 1 - Never  Diabetic?No  Interpreter Needed?: No  Information entered by :: [place] [facility] Daily Living    11/10/2021    9:18 AM 01/30/2021   10:56 AM  In your present state of health, do you have any difficulty performing the following activities:  Hearing? 0 0  Vision? 0 0  Difficulty concentrating or making decisions? 0 0  Walking or climbing stairs? 1 1  Dressing or bathing? 0 0  Doing errands, shopping? 0 0  Preparing Food and eating ? N   Using the Toilet? N   In the past six months, have you accidently leaked urine? Y   Do you have problems with loss of bowel control? N   Managing your Medications? N   Managing your Finances? N   Housekeeping or managing your Housekeeping? N     Patient Care Team: [name], MD as PCP - General (Family Medicine) Inda Castle, MD (Inactive) as Consulting Physician (Gastroenterology) Felecia Shelling, Nanine Means, MD (Neurology)  Indicate any recent Medical Services you may have received from other than [facility] providers in the past year (date may be approximate).     Assessment:   This is a routine wellness examination for Ronica.  Hearing/Vision screen No results found.  Dietary issues and exercise activities  discussed: Current Exercise Habits: Home exercise routine, Type of exercise: walking, Time (Minutes): 20, Frequency (Times/Week): 7, Weekly Exercise (Minutes/Week): 140, Intensity: Mild, Exercise limited by: None identified   Goals Addressed               This Visit's Progress     Pt wants to go back to school for diploma. (pt-stated)   Not on track     To have a more motivated mindset. (pt-stated)   On track      Depression Screen    11/10/2021    9:13 AM 07/10/2021   10:37 AM 05/23/2020   11:25 AM 11/29/2017    9:27 AM 11/27/2016    9:39 AM 03/15/2015    8:19 AM  PHQ 2/9 Scores  PHQ - 2 Score '2 6 2 6 1 '$ 0  PHQ- 9 Score '6 16 8 12    '$ Exception Documentation      Patient refusal    Fall Risk    11/10/2021    9:12 AM 07/10/2021   10:39 AM 05/23/2020   11:29 AM 11/29/2017    9:27 AM 03/15/2015    8:19 AM  Fall Risk   Falls in the past year? '1 1 1 '$ No No  Number falls in past yr: '1 1 1    '$ Injury with Fall? 0 0 0    Risk for fall due to : History of fall(s) History of fall(s)     Follow up Falls evaluation completed Falls evaluation completed       [place]:  Any stairs in or around the home? No  If so, are there any without handrails? No  Home free of loose throw rugs in walkways, pet beds, electrical cords, etc? Yes  Adequate lighting in your home to reduce risk of falls? Yes   ASSISTIVE DEVICES UTILIZED TO PREVENT FALLS:  Life alert? No  Use of a  cane, walker or w/c? Yes  Grab bars in the bathroom? No  Shower chair or bench in shower? Yes  Elevated toilet seat or a handicapped toilet? Yes   TIMED UP AND GO:  Was the test performed? No .    Cognitive Function:    11/29/2017    9:29 AM  MMSE - Mini Mental State Exam  Orientation to time 5  Orientation to Place 5  Registration 3  Attention/ Calculation 5  Recall 3  Language- name 2 objects 2  Language- repeat 1  Language- follow 3 step command 3  Language- read & follow  direction 1  Write a sentence 1  Copy design 1  Total score 30        11/10/2021    9:22 AM  6CIT Screen  What Year? 0 points  What month? 0 points  What time? 0 points  Count back from 20 0 points  Months in reverse 4 points  Repeat phrase 2 points  Total Score 6 points    Immunizations Immunization History  Administered Date(s) Administered   Fluad Quad(high Dose 65+) 03/05/2021   Influenza,inj,Quad PF,6+ Mos 03/06/2013, 03/15/2015, 04/20/2017, 04/20/2019   Influenza-Unspecified 04/20/2017, 04/02/2019   Moderna Sars-Covid-2 Vaccination 10/25/2019, 11/23/2019   PFIZER(Purple Top)SARS-COV-2 Vaccination 08/12/2020   PNEUMOCOCCAL CONJUGATE-20 01/30/2021   Pfizer Covid-19 Vaccine Bivalent Booster 39yr & up 07/10/2021   Tdap 08/22/2013    TDAP status: Up to date  Flu Vaccine status: Up to date  Pneumococcal vaccine status: Up to date  Covid-19 vaccine status: Completed vaccines  Qualifies for Shingles Vaccine? Yes   Zostavax completed No   Shingrix Completed?: No.    Education has been provided regarding the importance of this vaccine. Patient has been advised to call insurance company to determine out of pocket expense if they have not yet received this vaccine. Advised may also receive vaccine at local pharmacy or Health Dept. Verbalized acceptance and understanding.  Screening Tests Health Maintenance  Topic Date Due   Zoster Vaccines- Shingrix (1 of 2) Never done   COLONOSCOPY (Pts 45-467yrInsurance coverage will need to be confirmed)  06/30/2021   COVID-19 Vaccine (5 - Moderna series) 11/07/2021   INFLUENZA VACCINE  12/30/2021   MAMMOGRAM  10/04/2022   TETANUS/TDAP  08/23/2023   Pneumonia Vaccine 6589Years old  Completed   DEXA SCAN  Completed   Hepatitis C Screening  Completed   HPV VACCINES  Aged Out    Health Maintenance  Health Maintenance Due  Topic Date Due   Zoster Vaccines- Shingrix (1 of 2) Never done   COLONOSCOPY (Pts 45-4915yrnsurance  coverage will need to be confirmed)  06/30/2021   COVID-19 Vaccine (5 - Moderna series) 11/07/2021    Colorectal cancer screening: Referral to GI placed declined. Pt aware the office will call re: appt.  Mammogram status: Ordered declined. Pt provided with contact info and advised to call to schedule appt.   Bone Density status: Ordered declined. Pt provided with contact info and advised to call to schedule appt.  Lung Cancer Screening: (Low Dose CT Chest recommended if Age 26-45-80ars, 30 pack-year currently smoking OR have quit w/in 15years.) does not qualify.   Lung Cancer Screening Referral: N/A  Additional Screening:  Hepatitis C Screening: does qualify; Completed 01/30/21  Vision Screening: Recommended annual ophthalmology exams for early detection of glaucoma and other disorders of the eye. Is the patient up to date with their annual eye exam?  Yes  Who is the  provider or what is the name of the office in which the patient attends annual eye exams? Doesn't remember the name If pt is not established with a provider, would they like to be referred to a provider to establish care? No .   Dental Screening: Recommended annual dental exams for proper oral hygiene  Community Resource Referral / Chronic Care Management: CRR required this visit?  No   CCM required this visit?  No      Plan:     I have personally reviewed and noted the following in the patient's chart:   Medical and social history Use of alcohol, tobacco or illicit drugs  Current medications and supplements including opioid prescriptions.  Functional ability and status Nutritional status Physical activity Advanced directives List of other physicians Hospitalizations, surgeries, and ER visits in previous 12 months Vitals Screenings to include cognitive, depression, and falls Referrals and appointments  In addition, I have reviewed and discussed with patient certain preventive protocols, quality metrics,  and best practice recommendations. A written personalized care plan for preventive services as well as general preventive health recommendations were provided to patient.   Due to this being a telephonic visit, the after visit summary with patients personalized plan was offered to patient via mail or my-chart. Patient would like to access on my-chart    Nicholaus Corolla, [place]   11/10/2021   Nurse Notes: none

## 2021-11-17 ENCOUNTER — Other Ambulatory Visit: Payer: Self-pay | Admitting: Family Medicine

## 2021-11-17 DIAGNOSIS — Z1231 Encounter for screening mammogram for malignant neoplasm of breast: Secondary | ICD-10-CM

## 2021-11-18 NOTE — Therapy (Signed)
[facility] [address] [place], [place], [address] Phone: [phone]   Fax:  [phone]  Patient Details  Name: Sydney Pace MRN: [id number] Date of Birth: 01/28/1956 Referring Provider:  No ref. provider found PHYSICAL THERAPY DISCHARGE SUMMARY  Visits from Start of Care: 11  Current functional level related to goals / functional outcomes: Progressing with POC details and meeting STG/LTG   Remaining deficits: Knee pain/dysfunction. Facilitated order for unloading knee brace to reduce pain and improve stability. Pt has f/u with PCP for this   Education / Equipment: HEP, rollator recommendation, hinged knee brace   Patient agrees to discharge. Patient goals were partially met. Patient is being discharged due to not returning since the last visit.  Encounter Date: 11/18/2021   Toniann Fail, PT 11/18/2021, 1:14 PM  [facility] [address], [place], [place], [address] Phone: [phone]   Fax:  [phone]

## 2021-11-21 ENCOUNTER — Ambulatory Visit
Admission: RE | Admit: 2021-11-21 | Discharge: 2021-11-21 | Disposition: A | Discharge status: Home / Self Care | Payer: Medicare (Managed Care) | Source: Ambulatory Visit | Attending: Family Medicine | Admitting: Family Medicine

## 2021-11-21 DIAGNOSIS — Z1231 Encounter for screening mammogram for malignant neoplasm of breast: Secondary | ICD-10-CM | POA: Diagnosis not present

## 2021-11-25 ENCOUNTER — Encounter: Payer: Self-pay | Admitting: Gastroenterology

## 2021-12-15 ENCOUNTER — Ambulatory Visit (AMBULATORY_SURGERY_CENTER): Payer: Self-pay

## 2021-12-15 VITALS — Ht 66.5 in | Wt 278.0 lb

## 2021-12-15 DIAGNOSIS — Z86010 Personal history of colonic polyps: Secondary | ICD-10-CM

## 2021-12-15 MED ORDER — NA SULFATE-K SULFATE-MG SULF 17.5-3.13-1.6 GM/177ML PO SOLN: 1.0000 | Freq: Once | ORAL | 0 refills | Status: AC

## 2021-12-15 NOTE — Progress Notes (Signed)
No egg or soy allergy known to patient  No issues known to pt with past sedation with any surgeries or procedures Patient denies ever being told they had issues or difficulty with intubation  No FH of Malignant Hyperthermia Pt is not on diet pills Pt is not on home 02  Pt is not on blood thinners  Pt denies issues with constipation  No A fib or A flutter Have any cardiac testing pending--NO Pt instructed to use Singlecare.com or GoodRx for a price reduction on prep  Patient uses a rollator to ambulate; patient admits to 2 falls (out of bed) in the last month;

## 2021-12-25 ENCOUNTER — Ambulatory Visit: Payer: Medicare (Managed Care) | Admitting: Family Medicine

## 2021-12-25 DIAGNOSIS — E041 Nontoxic single thyroid nodule: Secondary | ICD-10-CM

## 2021-12-25 DIAGNOSIS — E782 Mixed hyperlipidemia: Secondary | ICD-10-CM

## 2021-12-25 DIAGNOSIS — I10 Essential (primary) hypertension: Secondary | ICD-10-CM

## 2021-12-25 DIAGNOSIS — G35 Multiple sclerosis: Secondary | ICD-10-CM

## 2021-12-28 ENCOUNTER — Encounter: Payer: Self-pay | Admitting: Certified Registered Nurse Anesthetist

## 2022-01-01 ENCOUNTER — Encounter: Payer: Self-pay | Admitting: Gastroenterology

## 2022-01-02 ENCOUNTER — Encounter: Payer: Self-pay | Admitting: Gastroenterology

## 2022-01-05 ENCOUNTER — Ambulatory Visit (AMBULATORY_SURGERY_CENTER): Payer: Medicare (Managed Care) | Admitting: Gastroenterology

## 2022-01-05 ENCOUNTER — Encounter: Payer: Self-pay | Admitting: Gastroenterology

## 2022-01-05 VITALS — BP 101/59 | HR 82 | Temp 97.3°F | Resp 15 | Ht 66.5 in | Wt 278.0 lb

## 2022-01-05 DIAGNOSIS — Z09 Encounter for follow-up examination after completed treatment for conditions other than malignant neoplasm: Secondary | ICD-10-CM | POA: Diagnosis not present

## 2022-01-05 DIAGNOSIS — I10 Essential (primary) hypertension: Secondary | ICD-10-CM | POA: Diagnosis not present

## 2022-01-05 DIAGNOSIS — G35 Multiple sclerosis: Secondary | ICD-10-CM | POA: Diagnosis not present

## 2022-01-05 DIAGNOSIS — D123 Benign neoplasm of transverse colon: Secondary | ICD-10-CM

## 2022-01-05 DIAGNOSIS — D122 Benign neoplasm of ascending colon: Secondary | ICD-10-CM

## 2022-01-05 DIAGNOSIS — Z86010 Personal history of colonic polyps: Secondary | ICD-10-CM

## 2022-01-05 MED ORDER — SODIUM CHLORIDE 0.9 % IV SOLN: 500.0000 mL | INTRAVENOUS | Status: DC

## 2022-01-05 NOTE — Progress Notes (Signed)
Called to room to assist during endoscopic procedure.  Patient ID and intended procedure confirmed with present staff. Received instructions for my participation in the procedure from the performing physician.  

## 2022-01-05 NOTE — Progress Notes (Signed)
GASTROENTEROLOGY PROCEDURE H&P NOTE   Primary Care Physician: [name], MD    Reason for Procedure:  Colon Cancer screening, colon polyp surveillance  Plan:    Colonoscopy  Patient is appropriate for endoscopic procedure(s) in the ambulatory ([place]) setting.  The nature of the procedure, as well as the risks, benefits, and alternatives were carefully and thoroughly reviewed with the patient. Ample time for discussion and questions allowed. The patient understood, was satisfied, and agreed to proceed.     HPI: Sydney Pace is a 66 y.o. female who presents for colonoscopy for Colon Cancer screening and ongoing polyp surveillance.  No active GI symptoms.    Last colonoscopy was 06/2018 and notable for 3 polyps in the transverse colon/cecum (path: TA x3), And 2 mm rectosigmoid hyperplastic polyp.  Recommended 3   Past Medical History:  Diagnosis Date   Anemia    Arthritis    on meds   Arthritis 07/06/2017   Constipation    occ   Dehydration 06/09/2016   Depression    on meds   Esophageal reflux 08/08/2012   on meds   GERD (gastroesophageal reflux disease)    on meds   Hypertension    on meds   Inverted nipple    right nipple has always been inverted - per pt   MS (multiple sclerosis) ([place])    Neuromuscular disorder ([place])    MS   Other and unspecified hyperlipidemia 08/08/2012   on meds   Seasonal allergies    Thyroid disease     Past Surgical History:  Procedure Laterality Date   COLONOSCOPY  2020   VC-MAC-prep adeq-TA X 3;   HYSTEROSCOPY  02, 04, 2008   with D&C   ORIF WRIST FRACTURE Right 04/07/2018   Procedure: RIGHT WRIST OPEN REDUCTION INTERNAL FIXATION (ORIF);  Surgeon: Meredith Pel, MD;  Location: [facility];  Service: Orthopedics;  Laterality: Right;   POLYPECTOMY  2020   TA x 3;   TMJ ARTHROSCOPY     TONSILLECTOMY     UPPER GASTROINTESTINAL ENDOSCOPY      Prior to Admission medications   Medication Sig Start Date End Date Taking?  Authorizing Provider  brimonidine (ALPHAGAN) 0.2 % ophthalmic solution Place 1 drop into both eyes 2 (two) times daily.   Yes [provider]  buPROPion (WELLBUTRIN XL) 300 MG 24 hr tablet Take 1 tablet by mouth once daily 08/18/21  Yes [name], MD  Carboxymethylcellul-Glycerin (LUBRICATING EYE DROPS OP) Place 1 drop into both eyes daily as needed (dry eyes).   Yes [provider]  cetirizine (ZYRTEC) 10 MG tablet Take 10 mg by mouth every evening.    Yes [provider]  Cholecalciferol (DIALYVITE VITAMIN D 5000 PO) Take 4,000 Units by mouth daily.    Yes [provider]  fluticasone (FLONASE) 50 MCG/ACT nasal spray USE 2 SPRAY(S) IN EACH NOSTRIL ONCE DAILY AS NEEDED FOR ALLERGIES OR  RHINITIS 09/24/21  Yes [name], MD  lisinopril-hydrochlorothiazide (ZESTORETIC) 20-25 MG tablet Take 1 tablet by mouth once daily 05/27/21  Yes [name], MD  methylphenidate (RITALIN) 20 MG tablet One po qAM and one po qNoon 11/05/21  Yes Sater, Nanine Means, MD  Multiple Vitamin (MULTIVITAMIN) tablet Take 1 tablet by mouth daily.   Yes [provider]  Probiotic Product (PROBIOTIC DAILY PO) Take by mouth.   Yes [provider]  sertraline (ZOLOFT) 100 MG tablet Take 1.5 tablets (150 mg total) by mouth  daily. 09/19/21  Yes [name], MD  solifenacin (VESICARE) 5 MG tablet Take 1 tablet by mouth once daily 11/05/21  Yes [name], MD  ALPRAZolam Duanne Moron) 0.25 MG tablet Take 1 tablet (0.25 mg total) by mouth 2 (two) times daily as needed for anxiety. 05/23/20   [name], MD  AZO-CRANBERRY PO Take 1 tablet by mouth daily as needed.    [provider]  dorzolamide-timolol (COSOPT) 22.3-6.8 MG/ML ophthalmic solution Place 1 drop into both eyes 2 (two) times daily. Patient not taking: Reported on 01/05/2022 09/23/21   [provider]  furosemide (LASIX) 20 MG tablet Take 1 tablet (20 mg total) by mouth daily as  needed. Patient not taking: Reported on 12/15/2021 01/10/18   [name], MD  naproxen sodium (ALEVE) 220 MG tablet Take 220 mg by mouth daily as needed (pain).    [provider]    Current Outpatient Medications  Medication Sig Dispense Refill   brimonidine (ALPHAGAN) 0.2 % ophthalmic solution Place 1 drop into both eyes 2 (two) times daily.     buPROPion (WELLBUTRIN XL) 300 MG 24 hr tablet Take 1 tablet by mouth once daily 90 tablet 0   Carboxymethylcellul-Glycerin (LUBRICATING EYE DROPS OP) Place 1 drop into both eyes daily as needed (dry eyes).     cetirizine (ZYRTEC) 10 MG tablet Take 10 mg by mouth every evening.      Cholecalciferol (DIALYVITE VITAMIN D 5000 PO) Take 4,000 Units by mouth daily.      fluticasone (FLONASE) 50 MCG/ACT nasal spray USE 2 SPRAY(S) IN EACH NOSTRIL ONCE DAILY AS NEEDED FOR ALLERGIES OR  RHINITIS 16 g 0   lisinopril-hydrochlorothiazide (ZESTORETIC) 20-25 MG tablet Take 1 tablet by mouth once daily 90 tablet 0   methylphenidate (RITALIN) 20 MG tablet One po qAM and one po qNoon 60 tablet 0   Multiple Vitamin (MULTIVITAMIN) tablet Take 1 tablet by mouth daily.     Probiotic Product (PROBIOTIC DAILY PO) Take by mouth.     sertraline (ZOLOFT) 100 MG tablet Take 1.5 tablets (150 mg total) by mouth daily. 135 tablet 1   solifenacin (VESICARE) 5 MG tablet Take 1 tablet by mouth once daily 30 tablet 2   ALPRAZolam (XANAX) 0.25 MG tablet Take 1 tablet (0.25 mg total) by mouth 2 (two) times daily as needed for anxiety. 20 tablet 1   AZO-CRANBERRY PO Take 1 tablet by mouth daily as needed.     dorzolamide-timolol (COSOPT) 22.3-6.8 MG/ML ophthalmic solution Place 1 drop into both eyes 2 (two) times daily. (Patient not taking: Reported on 01/05/2022)     furosemide (LASIX) 20 MG tablet Take 1 tablet (20 mg total) by mouth daily as needed. (Patient not taking: Reported on 12/15/2021) 90 tablet 1   naproxen sodium (ALEVE) 220 MG tablet Take 220 mg by mouth daily as  needed (pain).     Current Facility-Administered Medications  Medication Dose Route Frequency Provider Last Rate Last Admin   0.9 %  sodium chloride infusion  500 mL Intravenous Continuous Jania Steinke V, DO        Allergies as of 01/05/2022 - Review Complete 01/05/2022  Allergen Reaction Noted   Shellfish allergy Hives, Shortness Of Breath, Itching, Swelling, and Rash 08/03/2018    Family History  Problem Relation Age of Onset   Heart disease Mother        pacemaker   Emphysema Mother    Hypertension Mother    Heart disease Father  Diabetes Father    Heart disease Sister        cad   Sleep apnea Brother    Colon cancer Neg Hx    Esophageal cancer Neg Hx    Rectal cancer Neg Hx    Stomach cancer Neg Hx    Colon polyps Neg Hx     Social History   Socioeconomic History   Marital status: Single    Spouse name: Not on file   Number of children: Not on file   Years of education: Not on file   Highest education level: Not on file  Occupational History   Not on file  Tobacco Use   Smoking status: Never   Smokeless tobacco: Never  Vaping Use   Vaping Use: Never used  Substance and Sexual Activity   Alcohol use: Not Currently    Alcohol/week: 0.0 - 1.0 standard drinks of alcohol    Comment: occassional   Drug use: No   Sexual activity: Never    Comment: 1st intercourse 55 yo-5 partners  Other Topics Concern   Not on file  Social History Narrative   Not on file   Social Determinants of Health   Financial Resource Strain: Low Risk  (11/10/2021)   Overall Financial Resource Strain (CARDIA)    Difficulty of Paying Living Expenses: Not hard at all  Food Insecurity: No Food Insecurity (11/10/2021)   Hunger Vital Sign    Worried About Running Out of Food in the Last Year: Never true    Ran Out of Food in the Last Year: Never true  Transportation Needs: No Transportation Needs (11/10/2021)   PRAPARE - Hydrologist (Medical): No     Lack of Transportation (Non-Medical): No  Physical Activity: Not on file  Stress: No Stress Concern Present (11/10/2021)   [place]    Feeling of Stress : Not at all  Social Connections: Socially Isolated (11/10/2021)   Social Connection and Isolation Panel [NHANES]    Frequency of Communication with Friends and Family: Once a week    Frequency of Social Gatherings with Friends and Family: Never    Attends Religious Services: Never    Marine scientist or Organizations: No    Attends Archivist Meetings: Never    Marital Status: Never married  Intimate Partner Violence: Not At Risk (11/10/2021)   Humiliation, Afraid, Rape, and Kick questionnaire    Fear of Current or Ex-Partner: No    Emotionally Abused: No    Physically Abused: No    Sexually Abused: No    Physical Exam: Vital signs in last 24 hours: _0  127/72   Pulse 82   Temp (!) 97.3 F (36.3 C)   Resp 15   Ht 5' 6.5" (1.689 m)   Wt 278 lb (126.1 kg)   LMP  (LMP Unknown)   SpO2 99%   BMI 44.20 kg/m  GEN: NAD EYE: Sclerae anicteric ENT: MMM CV: Non-tachycardic Pulm: CTA b/l GI: Soft, NT/ND NEURO:  Alert & Oriented x 3   Gerrit Heck, DO [facility]   01/05/2022 10:13 AM

## 2022-01-05 NOTE — Patient Instructions (Addendum)
Handout on polyps given to you today   YOU HAD AN ENDOSCOPIC PROCEDURE TODAY AT [facility]:   Refer to the procedure report that was given to you for any specific questions about what was found during the examination.  If the procedure report does not answer your questions, please call your gastroenterologist to clarify.  If you requested that your care partner not be given the details of your procedure findings, then the procedure report has been included in a sealed envelope for you to review at your convenience later.  YOU SHOULD EXPECT: Some feelings of bloating in the abdomen. Passage of more gas than usual.  Walking can help get rid of the air that was put into your GI tract during the procedure and reduce the bloating. If you had a lower endoscopy (such as a colonoscopy or flexible sigmoidoscopy) you may notice spotting of blood in your stool or on the toilet paper. If you underwent a bowel prep for your procedure, you may not have a normal bowel movement for a few days.  Please Note:  You might notice some irritation and congestion in your nose or some drainage.  This is from the oxygen used during your procedure.  There is no need for concern and it should clear up in a day or so.  SYMPTOMS TO REPORT IMMEDIATELY:  Following lower endoscopy (colonoscopy or flexible sigmoidoscopy):  Excessive amounts of blood in the stool  Significant tenderness or worsening of abdominal pains  Swelling of the abdomen that is new, acute  Fever of 100F or higher  For urgent or emergent issues, a gastroenterologist can be reached at any hour by calling [phone]. Do not use MyChart messaging for urgent concerns.    DIET:  We do recommend a small meal at first, but then you may proceed to your regular diet.  Drink plenty of fluids but you should avoid alcoholic beverages for 24 hours.  ACTIVITY:  You should plan to take it easy for the rest of today and you should NOT DRIVE or use  heavy machinery until tomorrow (because of the sedation medicines used during the test).    FOLLOW UP: Our staff will call the number listed on your records the next business day following your procedure.  We will call around 7:15- 8:00 am to check on you and address any questions or concerns that you may have regarding the information given to you following your procedure. If we do not reach you, we will leave a message.  If you develop any symptoms (ie: fever, flu-like symptoms, shortness of breath, cough etc.) before then, please call [phone].  If you test positive for Covid 19 in the 2 weeks post procedure, please call and report this information to [place].    If any biopsies were taken you will be contacted by phone or by letter within the next 1-3 weeks.  Please call us at [phone] if you have not heard about the biopsies in 3 weeks.    SIGNATURES/CONFIDENTIALITY: You and/or your care partner have signed paperwork which will be entered into your electronic medical record.  These signatures attest to the fact that that the information above on your After Visit Summary has been reviewed and is understood.  Full responsibility of the confidentiality of this discharge information lies with you and/or your care-partner.

## 2022-01-05 NOTE — Progress Notes (Signed)
1038 Ephedrine 10 mg given IV due to low BP, MD updated.  

## 2022-01-05 NOTE — Op Note (Signed)
[facility] Patient Name: Sydney Pace Procedure Date: 01/05/2022 10:13 AM MRN: [id number] Endoscopist: Gerrit Heck , MD Age: 66 Referring MD:  Date of Birth: January 23, 1956 Gender: Female Account #: [id number] Procedure:                Colonoscopy Indications:              Surveillance: Personal history of adenomatous                            polyps on last colonoscopy 3 years ago                           Last colonoscopy was 06/2018 and notable for 3                            polyps in the transverse colon/cecum (path: TA x3),                            and 2 mm rectosigmoid hyperplastic polyp. Medicines:                Monitored Anesthesia Care Procedure:                Pre-Anesthesia Assessment:                           - Prior to the procedure, a History and Physical                            was performed, and patient medications and                            allergies were reviewed. The patient's tolerance of                            previous anesthesia was also reviewed. The risks                            and benefits of the procedure and the sedation                            options and risks were discussed with the patient.                            All questions were answered, and informed consent                            was obtained. Prior Anticoagulants: The patient has                            taken no previous anticoagulant or antiplatelet                            agents. ASA Grade Assessment: II - A patient with  mild systemic disease. After reviewing the risks                            and benefits, the patient was deemed in                            satisfactory condition to undergo the procedure.                           After obtaining informed consent, the colonoscope                            was passed under direct vision. Throughout the                            procedure, the patient's blood  pressure, pulse, and                            oxygen saturations were monitored continuously. The                            Olympus CF-HQ190L [id number]) Colonoscope was                            introduced through the anus and advanced to the the                            cecum, identified by appendiceal orifice and                            ileocecal valve. The colonoscopy was performed                            without difficulty. The patient tolerated the                            procedure well. The quality of the bowel                            preparation was good. The ileocecal valve,                            appendiceal orifice, and rectum were photographed. Scope In: 10:21:21 AM Scope Out: 10:40:51 AM Scope Withdrawal Time: 0 hours 13 minutes 55 seconds  Total Procedure Duration: 0 hours 19 minutes 30 seconds  Findings:                 The perianal and digital rectal examinations were                            normal.                           Three sessile polyps were found in the transverse  colon (1) and ascending colon (2). The polyps were                            2 to 3 mm in size. These polyps were removed with a                            cold snare. Resection and retrieval were complete.                            Estimated blood loss was minimal.                           The retroflexed view of the distal rectum and anal                            verge was normal and showed no anal or rectal                            abnormalities. Complications:            No immediate complications. Estimated Blood Loss:     Estimated blood loss was minimal. Impression:               - Three 2 to 3 mm polyps in the transverse colon                            and in the ascending colon, removed with a cold                            snare. Resected and retrieved.                           - The distal rectum and anal verge are normal on                             retroflexion view. Recommendation:           - Patient has a contact number available for                            emergencies. The signs and symptoms of potential                            delayed complications were discussed with the                            patient. Return to normal activities tomorrow.                            Written discharge instructions were provided to the                            patient.                           -  Resume previous diet.                           - Continue present medications.                           - Await pathology results.                           - Repeat colonoscopy in 5 years for surveillance                            based on pathology results.                           - Return to GI office PRN. Gerrit Heck, MD 01/05/2022 10:47:58 AM

## 2022-01-05 NOTE — Progress Notes (Signed)
1040 Report given to PACU, vss

## 2022-01-05 NOTE — Progress Notes (Signed)
1035 Ephedrine 10 mg given IV due to low BP, MD updated.

## 2022-01-06 ENCOUNTER — Telehealth: Payer: Self-pay

## 2022-01-06 NOTE — Telephone Encounter (Signed)
  Follow up Call-     01/05/2022    9:54 AM  Call back number  Post procedure Call Back phone  # [phone]  Permission to leave phone message Yes     Patient questions:  Do you have a fever, pain , or abdominal swelling? No. Pain Score  0 *  Have you tolerated food without any problems? Yes.    Have you been able to return to your normal activities? Yes.    Do you have any questions about your discharge instructions: Diet   No. Medications  No. Follow up visit  No.  Do you have questions or concerns about your Care? No.  Actions: * If pain score is 4 or above: No action needed, pain <4.  Patient started with a red, itchy, burning rash on her legs after starting the Miralax prep.  Patient states that it is a little better today and she is going to get some cream to put on it.  Advised patient to try benadryl or cortisone cream to help.  Patient verbalized understanding and will call back in a few days if rash is not better.

## 2022-01-13 ENCOUNTER — Ambulatory Visit: Payer: Medicare (Managed Care) | Admitting: Family Medicine

## 2022-01-13 ENCOUNTER — Encounter: Payer: Self-pay | Admitting: Gastroenterology

## 2022-01-13 NOTE — Progress Notes (Shared)
Subjective:   By signing my name below, I, Kellie Simmering, attest that this documentation has been prepared under the direction and in the presence of [name], MD 01/13/2022.   Patient ID: Sydney Pace, female    DOB: [birth year], 66 y.o.   MRN: [id number]  No chief complaint on file.  HPI Patient is in today for an office visit.   Past Medical History:  Diagnosis Date   Anemia    Arthritis    on meds   Arthritis 07/06/2017   Constipation    occ   Dehydration 06/09/2016   Depression    on meds   Esophageal reflux 08/08/2012   on meds   GERD (gastroesophageal reflux disease)    on meds   Hypertension    on meds   Inverted nipple    right nipple has always been inverted - per pt   MS (multiple sclerosis) ([place])    Neuromuscular disorder ([place])    MS   Other and unspecified hyperlipidemia 08/08/2012   on meds   Seasonal allergies    Thyroid disease    Past Surgical History:  Procedure Laterality Date   COLONOSCOPY  2020   VC-MAC-prep adeq-TA X 3;   HYSTEROSCOPY  02, 04, 2008   with D&C   ORIF WRIST FRACTURE Right 04/07/2018   Procedure: RIGHT WRIST OPEN REDUCTION INTERNAL FIXATION (ORIF);  Surgeon: Meredith Pel, MD;  Location: [facility];  Service: Orthopedics;  Laterality: Right;   POLYPECTOMY  2020   TA x 3;   TMJ ARTHROSCOPY     TONSILLECTOMY     UPPER GASTROINTESTINAL ENDOSCOPY     Family History  Problem Relation Age of Onset   Heart disease Mother        pacemaker   Emphysema Mother    Hypertension Mother    Heart disease Father    Diabetes Father    Heart disease Sister        cad   Sleep apnea Brother    Colon cancer Neg Hx    Esophageal cancer Neg Hx    Rectal cancer Neg Hx    Stomach cancer Neg Hx    Colon polyps Neg Hx    Social History   Socioeconomic History   Marital status: Single    Spouse name: Not on file   Number of children: Not on file   Years of education: Not on file   Highest education level: Not on file   Occupational History   Not on file  Tobacco Use   Smoking status: Never   Smokeless tobacco: Never  Vaping Use   Vaping Use: Never used  Substance and Sexual Activity   Alcohol use: Not Currently    Alcohol/week: 0.0 - 1.0 standard drinks of alcohol    Comment: occassional   Drug use: No   Sexual activity: Never    Comment: 1st intercourse 46 yo-5 partners  Other Topics Concern   Not on file  Social History Narrative   Not on file   Social Determinants of Health   Financial Resource Strain: Low Risk  (11/10/2021)   Overall Financial Resource Strain (CARDIA)    Difficulty of Paying Living Expenses: Not hard at all  Food Insecurity: No Food Insecurity (11/10/2021)   Hunger Vital Sign    Worried About Running Out of Food in the Last Year: Never true    Ran Out of Food in the Last Year: Never true  Transportation Needs: No Transportation Needs (  11/10/2021)   PRAPARE - Hydrologist (Medical): No    Lack of Transportation (Non-Medical): No  Physical Activity: Not on file  Stress: No Stress Concern Present (11/10/2021)   [place]    Feeling of Stress : Not at all  Social Connections: Socially Isolated (11/10/2021)   Social Connection and Isolation Panel [NHANES]    Frequency of Communication with Friends and Family: Once a week    Frequency of Social Gatherings with Friends and Family: Never    Attends Religious Services: Never    Marine scientist or Organizations: No    Attends Archivist Meetings: Never    Marital Status: Never married  Intimate Partner Violence: Not At Risk (11/10/2021)   Humiliation, Afraid, Rape, and Kick questionnaire    Fear of Current or Ex-Partner: No    Emotionally Abused: No    Physically Abused: No    Sexually Abused: No   Outpatient Medications Prior to Visit  Medication Sig Dispense Refill   ALPRAZolam (XANAX) 0.25 MG tablet Take 1  tablet (0.25 mg total) by mouth 2 (two) times daily as needed for anxiety. 20 tablet 1   AZO-CRANBERRY PO Take 1 tablet by mouth daily as needed.     brimonidine (ALPHAGAN) 0.2 % ophthalmic solution Place 1 drop into both eyes 2 (two) times daily.     buPROPion (WELLBUTRIN XL) 300 MG 24 hr tablet Take 1 tablet by mouth once daily 90 tablet 0   Carboxymethylcellul-Glycerin (LUBRICATING EYE DROPS OP) Place 1 drop into both eyes daily as needed (dry eyes).     cetirizine (ZYRTEC) 10 MG tablet Take 10 mg by mouth every evening.      Cholecalciferol (DIALYVITE VITAMIN D 5000 PO) Take 4,000 Units by mouth daily.      dorzolamide-timolol (COSOPT) 22.3-6.8 MG/ML ophthalmic solution Place 1 drop into both eyes 2 (two) times daily. (Patient not taking: Reported on 01/05/2022)     fluticasone (FLONASE) 50 MCG/ACT nasal spray USE 2 SPRAY(S) IN EACH NOSTRIL ONCE DAILY AS NEEDED FOR ALLERGIES OR  RHINITIS 16 g 0   furosemide (LASIX) 20 MG tablet Take 1 tablet (20 mg total) by mouth daily as needed. (Patient not taking: Reported on 12/15/2021) 90 tablet 1   lisinopril-hydrochlorothiazide (ZESTORETIC) 20-25 MG tablet Take 1 tablet by mouth once daily 90 tablet 0   methylphenidate (RITALIN) 20 MG tablet One po qAM and one po qNoon 60 tablet 0   Multiple Vitamin (MULTIVITAMIN) tablet Take 1 tablet by mouth daily.     naproxen sodium (ALEVE) 220 MG tablet Take 220 mg by mouth daily as needed (pain).     Probiotic Product (PROBIOTIC DAILY PO) Take by mouth.     sertraline (ZOLOFT) 100 MG tablet Take 1.5 tablets (150 mg total) by mouth daily. 135 tablet 1   solifenacin (VESICARE) 5 MG tablet Take 1 tablet by mouth once daily 30 tablet 2   No facility-administered medications prior to visit.   Allergies  Allergen Reactions   Shellfish Allergy Hives, Shortness Of Breath, Itching, Swelling and Rash    Pt had previously carried an epi-pen and had a history of severe reaction to shrimp with breathing problems and swelling  of lips and tongue    ROS     Objective:    Physical Exam Constitutional:      General: She is not in acute distress.    Appearance: Normal appearance. She  is not ill-appearing.  HENT:     Head: Normocephalic and atraumatic.     Right Ear: External ear normal.     Left Ear: External ear normal.     Mouth/Throat:     Mouth: Mucous membranes are moist.     Pharynx: Oropharynx is clear.  Eyes:     Extraocular Movements: Extraocular movements intact.     Pupils: Pupils are equal, round, and reactive to light.  Cardiovascular:     Rate and Rhythm: Normal rate.     Pulses: Normal pulses.     Heart sounds: Normal heart sounds. No murmur heard.    No gallop.  Pulmonary:     Effort: Pulmonary effort is normal. No respiratory distress.     Breath sounds: Normal breath sounds. No wheezing or rales.  Skin:    General: Skin is warm and dry.  Neurological:     Mental Status: She is alert and oriented to person, place, and time.  Psychiatric:        Mood and Affect: Mood normal.        Behavior: Behavior normal.        Judgment: Judgment normal.    LMP  (LMP Unknown)  Wt Readings from Last 3 Encounters:  01/05/22 278 lb (126.1 kg)  12/15/21 278 lb (126.1 kg)  11/05/21 276 lb 8 oz (125.4 kg)   Diabetic Foot Exam - Simple   No data filed    Lab Results  Component Value Date   WBC 6.0 07/10/2021   HGB 12.6 07/10/2021   HCT 38.0 07/10/2021   PLT 233.0 07/10/2021   GLUCOSE 87 07/10/2021   CHOL 164 07/10/2021   TRIG 44.0 07/10/2021   HDL 61.50 07/10/2021   LDLCALC 93 07/10/2021   ALT 17 07/10/2021   AST 20 07/10/2021   NA 139 07/10/2021   K 4.3 07/10/2021   CL 101 07/10/2021   CREATININE 0.97 07/10/2021   BUN 16 07/10/2021   CO2 33 (H) 07/10/2021   TSH 0.51 07/10/2021   HGBA1C 5.7 07/10/2021   MICROALBUR 1.17 01/02/2009   Lab Results  Component Value Date   TSH 0.51 07/10/2021   Lab Results  Component Value Date   WBC 6.0 07/10/2021   HGB 12.6 07/10/2021    HCT 38.0 07/10/2021   MCV 90.6 07/10/2021   PLT 233.0 07/10/2021   Lab Results  Component Value Date   NA 139 07/10/2021   K 4.3 07/10/2021   CO2 33 (H) 07/10/2021   GLUCOSE 87 07/10/2021   BUN 16 07/10/2021   CREATININE 0.97 07/10/2021   BILITOT 0.6 07/10/2021   ALKPHOS 98 07/10/2021   AST 20 07/10/2021   ALT 17 07/10/2021   PROT 7.5 07/10/2021   ALBUMIN 3.9 07/10/2021   CALCIUM 10.5 07/10/2021   ANIONGAP 11 04/07/2018   GFR 60.89 07/10/2021   Lab Results  Component Value Date   CHOL 164 07/10/2021   Lab Results  Component Value Date   HDL 61.50 07/10/2021   Lab Results  Component Value Date   LDLCALC 93 07/10/2021   Lab Results  Component Value Date   TRIG 44.0 07/10/2021   Lab Results  Component Value Date   CHOLHDL 3 07/10/2021   Lab Results  Component Value Date   HGBA1C 5.7 07/10/2021      Assessment & Plan:   Problem List Items Addressed This Visit   None  No orders of the defined types were placed in this encounter.  I,  Kellie Simmering, personally preformed the services described in this documentation.  All medical record entries made by the scribe were at my direction and in my presence.  I have reviewed the chart and discharge instructions (if applicable) and agree that the record reflects my personal performance and is accurate and complete. 01/13/2022  I,Mohammed Iqbal,acting as a scribe for Penni Homans, MD.,have documented all relevant documentation on the behalf of Penni Homans, MD,as directed by  Penni Homans, MD while in the presence of Penni Homans, MD.  Kellie Simmering

## 2022-01-15 ENCOUNTER — Other Ambulatory Visit: Payer: Self-pay | Admitting: Neurology

## 2022-01-15 MED ORDER — METHYLPHENIDATE HCL 20 MG PO TABS: ORAL_TABLET | ORAL | 0 refills | Status: DC

## 2022-01-15 NOTE — Telephone Encounter (Signed)
Pt is calling requesting a refill on methylphenidate (RITALIN) 20 MG tablet. Pt is requesting prescription be sent to [facility]

## 2022-01-15 NOTE — Telephone Encounter (Signed)
[place] drug registry has been verified.  Last refill 11/05/2021 # 60 for a 30 day supply.

## 2022-02-02 NOTE — Assessment & Plan Note (Signed)
Encouraged to get adequate exercise, calcium and vitamin d intake 

## 2022-02-02 NOTE — Assessment & Plan Note (Signed)
Well controlled, no changes to meds. Encouraged heart healthy diet such as the DASH diet and exercise as tolerated.  °

## 2022-02-02 NOTE — Assessment & Plan Note (Signed)
Supplement and monitor 

## 2022-02-02 NOTE — Assessment & Plan Note (Signed)
Encourage heart healthy diet such as MIND or DASH diet, increase exercise, avoid trans fats, simple carbohydrates and processed foods, consider a krill or fish or flaxseed oil cap daily.  °

## 2022-02-02 NOTE — Assessment & Plan Note (Signed)
Continues to follow with [facility] and has been largely stable

## 2022-02-03 ENCOUNTER — Ambulatory Visit: Payer: Medicare (Managed Care) | Admitting: Family Medicine

## 2022-02-03 VITALS — BP 124/64 | HR 82 | Temp 98.0°F | Resp 16 | Ht 66.0 in | Wt 265.8 lb

## 2022-02-03 DIAGNOSIS — G35 Multiple sclerosis: Secondary | ICD-10-CM

## 2022-02-03 DIAGNOSIS — N3281 Overactive bladder: Secondary | ICD-10-CM

## 2022-02-03 DIAGNOSIS — F322 Major depressive disorder, single episode, severe without psychotic features: Secondary | ICD-10-CM

## 2022-02-03 DIAGNOSIS — R32 Unspecified urinary incontinence: Secondary | ICD-10-CM | POA: Diagnosis not present

## 2022-02-03 DIAGNOSIS — E782 Mixed hyperlipidemia: Secondary | ICD-10-CM

## 2022-02-03 DIAGNOSIS — R739 Hyperglycemia, unspecified: Secondary | ICD-10-CM

## 2022-02-03 DIAGNOSIS — R109 Unspecified abdominal pain: Secondary | ICD-10-CM | POA: Diagnosis not present

## 2022-02-03 DIAGNOSIS — I10 Essential (primary) hypertension: Secondary | ICD-10-CM

## 2022-02-03 DIAGNOSIS — F32A Depression, unspecified: Secondary | ICD-10-CM | POA: Diagnosis not present

## 2022-02-03 DIAGNOSIS — M8580 Other specified disorders of bone density and structure, unspecified site: Secondary | ICD-10-CM

## 2022-02-03 DIAGNOSIS — R7989 Other specified abnormal findings of blood chemistry: Secondary | ICD-10-CM | POA: Diagnosis not present

## 2022-02-03 NOTE — Progress Notes (Signed)
Subjective:   By signing my name below, I, Kellie Simmering, attest that this documentation has been prepared under the direction and in the presence of [name], MD 02/03/2022.    Patient ID: Sydney Pace, female    DOB: 10-19-1955, 66 y.o.   MRN: [id number]  No chief complaint on file.  HPI Patient is in today for an office visit.  Depression: Sydney Pace is sad during today's visit due to her financial constraints, immobility, and pain. Sydney Pace reports that Sydney Pace had suicidal thoughts on 01/31/2022, but does not currently have an active plan to end her life. Sydney Pace is currently taking Bupropion 300 mg and Sertraline 100 mg to manage her depression, but Sydney Pace is interested in receiving counseling.    Gastroenterology: Sydney Pace recently had a colonoscopy on 01/05/2022.  - Three 2 to 3 mm polyps in the transverse colon and in the ascending colon, removed with a cold snare. Resected and retrieved. - The distal rectum and anal verge are normal on retroflexion view.  Urology: Sydney Pace complains of urinary complications at night. Sydney Pace states that Sydney Pace has an urge to use the bathroom when laying down, but not when sitting.   Immunizations: Sydney Pace has been informed about receiving COVID-19, high-dose Flu, and RSV immunizations. Sydney Pace is up to date on Tetanus immunizations. Sydney Pace is due for Shingles immunizations.   Physical therapy: Sydney Pace underwent physical therapy but is still experiencing pain in both legs, but more prominently in her right leg.   Past Medical History:  Diagnosis Date   Anemia    Arthritis    on meds   Arthritis 07/06/2017   Constipation    occ   Dehydration 06/09/2016   Depression    on meds   Esophageal reflux 08/08/2012   on meds   GERD (gastroesophageal reflux disease)    on meds   Hypertension    on meds   Inverted nipple    right nipple has always been inverted - per pt   MS (multiple sclerosis) ([place])    Neuromuscular disorder ([place])    MS   Other and unspecified hyperlipidemia  08/08/2012   on meds   Seasonal allergies    Thyroid disease    Past Surgical History:  Procedure Laterality Date   COLONOSCOPY  2020   VC-MAC-prep adeq-TA X 3;   HYSTEROSCOPY  02, 04, 2008   with D&C   ORIF WRIST FRACTURE Right 04/07/2018   Procedure: RIGHT WRIST OPEN REDUCTION INTERNAL FIXATION (ORIF);  Surgeon: Meredith Pel, MD;  Location: [facility];  Service: Orthopedics;  Laterality: Right;   POLYPECTOMY  2020   TA x 3;   TMJ ARTHROSCOPY     TONSILLECTOMY     UPPER GASTROINTESTINAL ENDOSCOPY     Family History  Problem Relation Age of Onset   Heart disease Mother        pacemaker   Emphysema Mother    Hypertension Mother    Heart disease Father    Diabetes Father    Heart disease Sister        cad   Sleep apnea Brother    Colon cancer Neg Hx    Esophageal cancer Neg Hx    Rectal cancer Neg Hx    Stomach cancer Neg Hx    Colon polyps Neg Hx    Social History   Socioeconomic History   Marital status: Single    Spouse name: Not on file   Number of children: Not on file   Years  of education: Not on file   Highest education level: Not on file  Occupational History   Not on file  Tobacco Use   Smoking status: Never   Smokeless tobacco: Never  Vaping Use   Vaping Use: Never used  Substance and Sexual Activity   Alcohol use: Not Currently    Alcohol/week: 0.0 - 1.0 standard drinks of alcohol    Comment: occassional   Drug use: No   Sexual activity: Never    Comment: 1st intercourse 73 yo-5 partners  Other Topics Concern   Not on file  Social History Narrative   Not on file   Social Determinants of Health   Financial Resource Strain: Low Risk  (11/10/2021)   Overall Financial Resource Strain (CARDIA)    Difficulty of Paying Living Expenses: Not hard at all  Food Insecurity: No Food Insecurity (11/10/2021)   Hunger Vital Sign    Worried About Running Out of Food in the Last Year: Never true    Ran Out of Food in the Last Year: Never true   Transportation Needs: No Transportation Needs (11/10/2021)   PRAPARE - Hydrologist (Medical): No    Lack of Transportation (Non-Medical): No  Physical Activity: Not on file  Stress: No Stress Concern Present (11/10/2021)   [place]    Feeling of Stress : Not at all  Social Connections: Socially Isolated (11/10/2021)   Social Connection and Isolation Panel [NHANES]    Frequency of Communication with Friends and Family: Once a week    Frequency of Social Gatherings with Friends and Family: Never    Attends Religious Services: Never    Marine scientist or Organizations: No    Attends Archivist Meetings: Never    Marital Status: Never married  Intimate Partner Violence: Not At Risk (11/10/2021)   Humiliation, Afraid, Rape, and Kick questionnaire    Fear of Current or Ex-Partner: No    Emotionally Abused: No    Physically Abused: No    Sexually Abused: No   Outpatient Medications Prior to Visit  Medication Sig Dispense Refill   ALPRAZolam (XANAX) 0.25 MG tablet Take 1 tablet (0.25 mg total) by mouth 2 (two) times daily as needed for anxiety. 20 tablet 1   AZO-CRANBERRY PO Take 1 tablet by mouth daily as needed.     brimonidine (ALPHAGAN) 0.2 % ophthalmic solution Place 1 drop into both eyes 2 (two) times daily.     buPROPion (WELLBUTRIN XL) 300 MG 24 hr tablet Take 1 tablet by mouth once daily 90 tablet 0   Carboxymethylcellul-Glycerin (LUBRICATING EYE DROPS OP) Place 1 drop into both eyes daily as needed (dry eyes).     cetirizine (ZYRTEC) 10 MG tablet Take 10 mg by mouth every evening.      Cholecalciferol (DIALYVITE VITAMIN D 5000 PO) Take 4,000 Units by mouth daily.      dorzolamide-timolol (COSOPT) 22.3-6.8 MG/ML ophthalmic solution Place 1 drop into both eyes 2 (two) times daily. (Patient not taking: Reported on 01/05/2022)     fluticasone (FLONASE) 50 MCG/ACT nasal spray  USE 2 SPRAY(S) IN EACH NOSTRIL ONCE DAILY AS NEEDED FOR ALLERGIES OR  RHINITIS 16 g 0   furosemide (LASIX) 20 MG tablet Take 1 tablet (20 mg total) by mouth daily as needed. (Patient not taking: Reported on 12/15/2021) 90 tablet 1   lisinopril-hydrochlorothiazide (ZESTORETIC) 20-25 MG tablet Take 1 tablet by mouth once daily 90  tablet 0   methylphenidate (RITALIN) 20 MG tablet One po qAM and one po qNoon 60 tablet 0   Multiple Vitamin (MULTIVITAMIN) tablet Take 1 tablet by mouth daily.     naproxen sodium (ALEVE) 220 MG tablet Take 220 mg by mouth daily as needed (pain).     Probiotic Product (PROBIOTIC DAILY PO) Take by mouth.     sertraline (ZOLOFT) 100 MG tablet Take 1.5 tablets (150 mg total) by mouth daily. 135 tablet 1   solifenacin (VESICARE) 5 MG tablet Take 1 tablet by mouth once daily 30 tablet 2   No facility-administered medications prior to visit.   Allergies  Allergen Reactions   Shellfish Allergy Hives, Shortness Of Breath, Itching, Swelling and Rash    Pt had previously carried an epi-pen and had a history of severe reaction to shrimp with breathing problems and swelling of lips and tongue    Review of Systems  Genitourinary:  Positive for urgency.      Objective:    Physical Exam Constitutional:      General: Sydney Pace is not in acute distress.    Appearance: Normal appearance. Sydney Pace is not ill-appearing.  HENT:     Head: Normocephalic and atraumatic.     Right Ear: External ear normal.     Left Ear: External ear normal.     Mouth/Throat:     Mouth: Mucous membranes are moist.     Pharynx: Oropharynx is clear.  Eyes:     Extraocular Movements: Extraocular movements intact.     Pupils: Pupils are equal, round, and reactive to light.  Cardiovascular:     Rate and Rhythm: Normal rate and regular rhythm.     Pulses: Normal pulses.     Heart sounds: Normal heart sounds. No murmur heard.    No gallop.  Pulmonary:     Effort: Pulmonary effort is normal. No respiratory  distress.     Breath sounds: Normal breath sounds. No wheezing or rales.  Abdominal:     General: Bowel sounds are normal.  Skin:    General: Skin is warm and dry.  Neurological:     Mental Status: Sydney Pace is alert and oriented to person, place, and time.  Psychiatric:        Mood and Affect: Mood normal.        Behavior: Behavior normal.        Judgment: Judgment normal.    LMP  (LMP Unknown)  Wt Readings from Last 3 Encounters:  01/05/22 278 lb (126.1 kg)  12/15/21 278 lb (126.1 kg)  11/05/21 276 lb 8 oz (125.4 kg)   Diabetic Foot Exam - Simple   No data filed    Lab Results  Component Value Date   WBC 6.0 07/10/2021   HGB 12.6 07/10/2021   HCT 38.0 07/10/2021   PLT 233.0 07/10/2021   GLUCOSE 87 07/10/2021   CHOL 164 07/10/2021   TRIG 44.0 07/10/2021   HDL 61.50 07/10/2021   LDLCALC 93 07/10/2021   ALT 17 07/10/2021   AST 20 07/10/2021   NA 139 07/10/2021   K 4.3 07/10/2021   CL 101 07/10/2021   CREATININE 0.97 07/10/2021   BUN 16 07/10/2021   CO2 33 (H) 07/10/2021   TSH 0.51 07/10/2021   HGBA1C 5.7 07/10/2021   MICROALBUR 1.17 01/02/2009   Lab Results  Component Value Date   TSH 0.51 07/10/2021   Lab Results  Component Value Date   WBC 6.0 07/10/2021   HGB 12.6 07/10/2021  HCT 38.0 07/10/2021   MCV 90.6 07/10/2021   PLT 233.0 07/10/2021   Lab Results  Component Value Date   NA 139 07/10/2021   K 4.3 07/10/2021   CO2 33 (H) 07/10/2021   GLUCOSE 87 07/10/2021   BUN 16 07/10/2021   CREATININE 0.97 07/10/2021   BILITOT 0.6 07/10/2021   ALKPHOS 98 07/10/2021   AST 20 07/10/2021   ALT 17 07/10/2021   PROT 7.5 07/10/2021   ALBUMIN 3.9 07/10/2021   CALCIUM 10.5 07/10/2021   ANIONGAP 11 04/07/2018   GFR 60.89 07/10/2021   Lab Results  Component Value Date   CHOL 164 07/10/2021   Lab Results  Component Value Date   HDL 61.50 07/10/2021   Lab Results  Component Value Date   LDLCALC 93 07/10/2021   Lab Results  Component Value Date   TRIG  44.0 07/10/2021   Lab Results  Component Value Date   CHOLHDL 3 07/10/2021   Lab Results  Component Value Date   HGBA1C 5.7 07/10/2021      Assessment & Plan:   Problem List Items Addressed This Visit       Cardiovascular and Mediastinum   Essential hypertension     Nervous and Auditory   Multiple sclerosis ([place])     Musculoskeletal and Integument   Osteopenia     Other   Hyperlipidemia, mixed   Low vitamin D level   No orders of the defined types were placed in this encounter.  I, Kellie Simmering, personally preformed the services described in this documentation.  All medical record entries made by the scribe were at my direction and in my presence.  I have reviewed the chart and discharge instructions (if applicable) and agree that the record reflects my personal performance and is accurate and complete. 02/03/2022  I,Mohammed Iqbal,acting as a scribe for Penni Homans, MD.,have documented all relevant documentation on the behalf of Penni Homans, MD,as directed by  Penni Homans, MD while in the presence of Penni Homans, MD.  Kellie Simmering

## 2022-02-03 NOTE — Patient Instructions (Addendum)
Can get compression hose in [place] special made  [facility] have a joint behavioral health ER open 24/7 to [place] residents [phone]   RSV (respiratory syncitial virus) vaccine at pharmacy Covid booster when new version out late September At pharmacy High dose flu shot mid Sept to mid Oct   Tetanus if injured   Shingles shot 3 months after last outbreak  Shingrix is the new shingles shot, 2 shots over 2-6 months, confirm coverage with insurance and document, then can return here for shots with nurse appt or at pharmacy

## 2022-02-04 DIAGNOSIS — H524 Presbyopia: Secondary | ICD-10-CM | POA: Diagnosis not present

## 2022-02-04 DIAGNOSIS — G4733 Obstructive sleep apnea (adult) (pediatric): Secondary | ICD-10-CM | POA: Diagnosis not present

## 2022-02-04 DIAGNOSIS — H401122 Primary open-angle glaucoma, left eye, moderate stage: Secondary | ICD-10-CM | POA: Diagnosis not present

## 2022-02-04 DIAGNOSIS — H5203 Hypermetropia, bilateral: Secondary | ICD-10-CM | POA: Diagnosis not present

## 2022-02-04 DIAGNOSIS — H40021 Open angle with borderline findings, high risk, right eye: Secondary | ICD-10-CM | POA: Diagnosis not present

## 2022-02-04 DIAGNOSIS — H47292 Other optic atrophy, left eye: Secondary | ICD-10-CM | POA: Diagnosis not present

## 2022-02-04 DIAGNOSIS — H52203 Unspecified astigmatism, bilateral: Secondary | ICD-10-CM | POA: Diagnosis not present

## 2022-02-04 DIAGNOSIS — G35 Multiple sclerosis: Secondary | ICD-10-CM | POA: Diagnosis not present

## 2022-02-04 DIAGNOSIS — H2513 Age-related nuclear cataract, bilateral: Secondary | ICD-10-CM | POA: Diagnosis not present

## 2022-02-04 LAB — HEMOGLOBIN A1C: Hgb A1c MFr Bld: 5.8 % (ref 4.6–6.5)

## 2022-02-04 LAB — COMPREHENSIVE METABOLIC PANEL
ALT: 9 U/L (ref 0–35)
AST: 14 U/L (ref 0–37)
Albumin: 3.8 g/dL (ref 3.5–5.2)
Alkaline Phosphatase: 99 U/L (ref 39–117)
BUN: 20 mg/dL (ref 6–23)
CO2: 29 mEq/L (ref 19–32)
Calcium: 10.3 mg/dL (ref 8.4–10.5)
Chloride: 102 mEq/L (ref 96–112)
Creatinine, Ser: 1.08 mg/dL (ref 0.40–1.20)
GFR: 53.31 mL/min — ABNORMAL LOW (ref >60.00)
Glucose, Bld: 81 mg/dL (ref 70–99)
Potassium: 4.2 mEq/L (ref 3.5–5.1)
Sodium: 138 mEq/L (ref 135–145)
Total Bilirubin: 0.6 mg/dL (ref 0.2–1.2)
Total Protein: 7.5 g/dL (ref 6.0–8.3)

## 2022-02-04 LAB — CBC
HCT: 36.6 % (ref 36.0–46.0)
Hemoglobin: 12.3 g/dL (ref 12.0–15.0)
MCHC: 33.7 g/dL (ref 30.0–36.0)
MCV: 90.6 fl (ref 78.0–100.0)
Platelets: 225 10*3/uL (ref 150.0–400.0)
RBC: 4.04 Mil/uL (ref 3.87–5.11)
RDW: 14.6 % (ref 11.5–15.5)
WBC: 6 10*3/uL (ref 4.0–10.5)

## 2022-02-04 LAB — LIPID PANEL
Cholesterol: 175 mg/dL (ref 0–200)
HDL: 48.2 mg/dL (ref >39.00)
LDL Cholesterol: 118 mg/dL — ABNORMAL HIGH (ref 0–99)
NonHDL: 127.02
Total CHOL/HDL Ratio: 4
Triglycerides: 47 mg/dL (ref 0.0–149.0)
VLDL: 9.4 mg/dL (ref 0.0–40.0)

## 2022-02-04 LAB — VITAMIN D 25 HYDROXY (VIT D DEFICIENCY, FRACTURES): VITD: 62.82 ng/mL (ref 30.00–100.00)

## 2022-02-04 LAB — TSH: TSH: 0.48 u[IU]/mL (ref 0.35–5.50)

## 2022-02-04 NOTE — Assessment & Plan Note (Signed)
She is very tearful at visit and acknowledges this year has been very difficult. She declines medications but agrees to a referral for intensive behavioral health therapy. She is given the number for [facility] emergency room so she can go there if she finds her self in crisis. She acknowledges anhedonia and fleeting thoughts of not being here but she has no suicidal plan.

## 2022-02-04 NOTE — Assessment & Plan Note (Signed)
Has been referred to urogynecology and she is willing to proceed. Have confirmed the referral and they will call her for an appointment.

## 2022-02-12 ENCOUNTER — Telehealth (HOSPITAL_COMMUNITY): Payer: Self-pay | Admitting: Psychiatry

## 2022-02-24 ENCOUNTER — Ambulatory Visit (INDEPENDENT_AMBULATORY_CARE_PROVIDER_SITE_OTHER): Payer: Medicare (Managed Care) | Admitting: Licensed Clinical Social Worker

## 2022-02-24 ENCOUNTER — Telehealth (HOSPITAL_COMMUNITY): Payer: Self-pay | Admitting: Licensed Clinical Social Worker

## 2022-02-24 DIAGNOSIS — F322 Major depressive disorder, single episode, severe without psychotic features: Secondary | ICD-10-CM | POA: Diagnosis not present

## 2022-02-24 NOTE — Telephone Encounter (Signed)
The therapist calls Sydney Pace as she did not arrive early for her appointment to complete new patient paperwork.   Before he can confirm her identity, she says that she is lost so the therapist provides her with directions to this office.   Adam Phenix, [place], LCSW, [facility], [place] 02/24/2022
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
## 2022-03-05 ENCOUNTER — Encounter (HOSPITAL_COMMUNITY): Payer: Self-pay

## 2022-03-05 ENCOUNTER — Telehealth (HOSPITAL_COMMUNITY): Payer: Self-pay | Admitting: Licensed Clinical Social Worker

## 2022-03-05 ENCOUNTER — Ambulatory Visit (INDEPENDENT_AMBULATORY_CARE_PROVIDER_SITE_OTHER): Payer: Medicare (Managed Care) | Admitting: Licensed Clinical Social Worker

## 2022-03-05 DIAGNOSIS — F322 Major depressive disorder, single episode, severe without psychotic features: Secondary | ICD-10-CM | POA: Diagnosis not present

## 2022-03-05 NOTE — Progress Notes (Signed)
THERAPIST PROGRESS NOTE  Session Time: 9:30 a.m. to 10 a.m.  Type of Therapy: Individual   Therapist Response/Interventions: Solution-Focused/Therapist completes Takara's Treatment Plan and informs her that her PHQ-9 score supports the need to see a psychiatrist to re-evaluate her anti-depressant medications.  The therapist obtains history that he could not obtain at her CCA as she arrived late. He suggests that once her anti-depressant is working and she has drive to do things that returning to the [place] could be beneficial to her as well as possibly attending bereavement support through Hospice.   Treatment Goals addressed: Briele will experience a decrease in her depression as evidenced by her PHQ-9 dropping from a 15 to a 4 or less in addition to [facility] (update due 09/04/2022.)  Summary: Kiyona arrives late for today's appointment as she made it to the building and was lost not knowing how to get to this office. She also says that there was Architect on [employer]. Her PHQ-9 score today is a 15. She says that she is dealing with death as her favorite cousin who is 58 year's old died on 09/04/22. She says that it was "so unexpected" but she will "be o.k." Toshi questions why it is that her doctors could not find this blood clot. She says that they talked every week noting that her best friend and sister-in-law both died last year. She says, "I think about them all the time."   She wonders why she is still here as they are "more important" than Siobhan as they had children but Alisia knows that she is here for a purpose. She has had no thoughts of suicide. Reva is not sure if she would describe herself as being a religious person saying that she has been to [facility] only three times this year as she is "so disorganized." Her beautician tells Annesha that she "lives on the edge."   Tyreshia says that she would like to lose some weight. She would  like her legs to be stronger. Dula does not have any leisure activities saying that her fun used to be [facility] and working out saying that this stopped likely due to depression.   Palma left her job in 2008 noting that she was "real active then" walking sometimes twice a day getting down to 175 pounds. She says that she did not want to leave her job but has MS. She says that her life "really went down" when she lost her mom in 2013. She says that things got harder financially as they used to "split everything."   Now, not exercising, she is getting "flabby" and does not feel like she looks nice in her clothes anymore but has not gained that much weight. She says that she does not want to see 300 pounds ever again in her life noting that she has weighed as much as 350 before. 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   Lorriane says that her nephew is "very caring," helps her, and is keeping her alive. He comes and gives her exercises for her to do for her legs.  Progress Towards Goals: Initial  Suicidal/Homicidal: No SI or HI Plan: Return again in 1 weeks.  Diagnosis: Major Depression, Severe, Single Episode  Collaboration of Care: Other N/A  Patient/Guardian was advised Release of Information must be obtained prior to any record release in order to collaborate their care with an outside provider. Patient/Guardian was advised if they have not already done so to contact the registration department to sign all necessary forms in order for [place] to release information regarding their care.   Consent: Patient/Guardian gives verbal consent for treatment and assignment of benefits for services provided during this visit. Patient/Guardian expressed understanding and agreed to proceed.   Adam Phenix, [place], LCSW, [facility], [place] 03/05/2022

## 2022-03-05 NOTE — Telephone Encounter (Signed)
The therapist calls Sydney Pace after she does not show for her appointment. She says that she is in the building and is frustrated as she cannot find the office with the therapist explaining that it is [address].  Adam Phenix, [place], LCSW, [facility], [place] 03/05/2022

## 2022-03-05 NOTE — Plan of Care (Signed)
  Problem:  Depression Goal:  Sydney Pace will experience a decrease in her depression as evidenced by her PHQ-9 dropping from a 15 to a 4 or less in addition to Sydney [facility]'s [facility].  Outcome: Not Applicable Intervention: Therapist will assist Sydney Pace in identifying and changing thoughts and behaviors that contribute to her feelings of depression while assisting her in building more natural supports.  Note: Reviewed with client.

## 2022-03-05 NOTE — Plan of Care (Signed)
Sydney Pace gives permission for therapist to sign electronically on her behalf. Problem:  Depression Goal:  Sydney Pace will experience a decrease in her depression as evidenced by her PHQ-9 dropping from a 15 to a 4 or less in addition to [name]'s [facility] to start [facility].  Outcome: Not Applicable Intervention: Therapist will assist [name] in identifying and changing thoughts and behaviors that contribute to her feelings of depression while assisting her in building more natural supports.  Note: Reviewed with client.

## 2022-03-07 ENCOUNTER — Other Ambulatory Visit: Payer: Self-pay | Admitting: Family Medicine

## 2022-03-12 ENCOUNTER — Ambulatory Visit (INDEPENDENT_AMBULATORY_CARE_PROVIDER_SITE_OTHER): Payer: Medicare (Managed Care) | Admitting: Licensed Clinical Social Worker

## 2022-03-12 ENCOUNTER — Telehealth: Payer: Self-pay | Admitting: Neurology

## 2022-03-12 ENCOUNTER — Other Ambulatory Visit: Payer: Self-pay | Admitting: *Deleted

## 2022-03-12 DIAGNOSIS — F322 Major depressive disorder, single episode, severe without psychotic features: Secondary | ICD-10-CM

## 2022-03-12 MED ORDER — METHYLPHENIDATE HCL 20 MG PO TABS: ORAL_TABLET | ORAL | 0 refills | Status: DC

## 2022-03-12 NOTE — Telephone Encounter (Signed)
Pt is needing a refill request for her methylphenidate (RITALIN) 20 MG tablet sent to the [facility] on [address]
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
## 2022-03-12 NOTE — Telephone Encounter (Signed)
Sent refill request to Dr. Brett Fairy since she is work in this afternoon. Dr. Felecia Shelling out

## 2022-03-17 ENCOUNTER — Ambulatory Visit (HOSPITAL_COMMUNITY): Payer: Medicare (Managed Care) | Admitting: Psychiatry

## 2022-03-19 ENCOUNTER — Ambulatory Visit (HOSPITAL_BASED_OUTPATIENT_CLINIC_OR_DEPARTMENT_OTHER): Payer: Medicare (Managed Care) | Admitting: Student in an Organized Health Care Education/Training Program

## 2022-03-19 ENCOUNTER — Encounter (HOSPITAL_COMMUNITY): Payer: Self-pay | Admitting: Student in an Organized Health Care Education/Training Program

## 2022-03-19 ENCOUNTER — Ambulatory Visit (INDEPENDENT_AMBULATORY_CARE_PROVIDER_SITE_OTHER): Payer: Medicare (Managed Care) | Admitting: Licensed Clinical Social Worker

## 2022-03-19 VITALS — BP 143/87 | HR 76 | Resp 12 | Wt 268.0 lb

## 2022-03-19 DIAGNOSIS — F332 Major depressive disorder, recurrent severe without psychotic features: Secondary | ICD-10-CM

## 2022-03-19 DIAGNOSIS — F322 Major depressive disorder, single episode, severe without psychotic features: Secondary | ICD-10-CM

## 2022-03-19 MED ORDER — ARIPIPRAZOLE 5 MG PO TABS: 5.0000 mg | ORAL_TABLET | Freq: Every day | ORAL | 2 refills | Status: DC

## 2022-03-19 NOTE — Progress Notes (Signed)
THERAPIST PROGRESS NOTE  Session Time: 9:15 a.m. to 10 a.m.  Type of Therapy: Individual   Therapist Response/Interventions: Solution-focused/The therapist encourages her to attend Church on Sunday and reminds [facility] she talks of wanting to do something.  The therapist coordinates with her new medication prescriber who has openings today to get [name] seen for a med eval today versus in November informing [name] that hopefully when she is sleeping better that she will have the motivation to [facility] and will be exercising again as she was back when a picture was taken with her and her family that she shows this therapist at the end of her visit.   Treatment Goals addressed: [name] will experience a decrease in her depression as evidenced by her PHQ-9 dropping from a 15 to a 4 or less in addition to Beau's [facility] to start [facility] again (update due 09/04/2022.)  Summary: Sydney Pace presents late for her scheduled appointment. She says that she cannot concentrate on reading saying that she wants to "go" and "be doing something."  She says that she went to the dentist but otherwise has done "nothing." She says that she thinks about [facility] back to the Y "all the time" but has not gotten the motivation to go yet. As for [facility], she says that she is "working on that" as she is planning on going to her [employer] on Sunday.   She continues to have problems with her sleep noting that she got perhaps an hour of sleep last night due to worrying about being on time for this appointment only to show up late.   Progress Towards Goals: Progressing  Suicidal/Homicidal: No SI or HI  Plan: Return again in 1 weeks.  Diagnosis: Major Depression, Severe, Single Episode  Collaboration of Care: Other N/A  Patient/Guardian was advised Release of Information must be obtained prior to any record release in order to collaborate their care with an outside  provider. Patient/Guardian was advised if they have not already done so to contact the registration department to sign all necessary forms in order for [place] to release information regarding their care.   Consent: Patient/Guardian gives verbal consent for treatment and assignment of benefits for services provided during this visit. Patient/Guardian expressed understanding and agreed to proceed.   Adam Phenix, [place], LCSW, [facility], [place] 03/19/2022

## 2022-03-19 NOTE — Progress Notes (Signed)
Psychiatric Initial Adult Assessment   Patient Identification: Sydney Pace MRN:  [id number] Date of Evaluation:  03/19/2022 Referral Source: Therapist Chief Complaint:   Chief Complaint  Patient presents with   Establish Care   Visit Diagnosis:    ICD-10-CM   1. Severe episode of recurrent major depressive disorder, without psychotic features ([place])  F33.2 ARIPiprazole (ABILIFY) 5 MG tablet      History of Present Illness:  Sydney Pace 66 yo is a patient with a PPH MDD and a PMH of MS. Patient reports that she is recommended the following regimen:  Ritalin 48m BID( per Neurology for low energy 2/2 MS) Wellbutrin XL 3014mdaily Zoloft 15068maily  Patient reports that she is not compliant with her medications. Patient reports that she is most compliant with her Ritalin endorsing that she is taking the AM dose but does not always feel she needs the PM. Patient reports that she has been less consistent the last few weeks with her other medications because she is not sure they make much difference. Patient reports that she had been taking her Zoloft QHS, and did not take it last night. Patient reports that she actually felt a bit better this AM without the Zoloft. Patient endorses that she was also not sleeping well after taking the Zoloft.   Patient reports that she has been having significant anhedonia with poor motivation and endorses that if she had not taken her Ritalin she probably would not have made it to her appt today. Patient reports that she is also very upset that she has been lacking the motivation to go the gym, and this was previously one of her favorite outlets. Patient endorses that this has led to negative changes in her physical body image. Patient reports that she also has feelings of hopelessness/ worthlessness/ guilt and continued low energy. Patient also endorses poor concentration. Patient denies SI, HI, and AVH.  Patient reports that she is not having any first  rank symptoms of paranoia.  Patient reports that she has some co-existing financial stressors and endorses that this is 2/2 to being scammed last year. Patient reports that she is now having more difficulty paying her rent, and has to make payments to different banks. Patient reports that she has lost her entire savings due to this matter. Patient does not endorse general anxiety, social anxiety, or panic attacks.   Patient does not endorse any symptoms for mania or hypomania and denies hx of significant trauma or PTSD symptoms.  Patient reports that she started medication for depression around the time her mother became ill in [id number] subsequently died in [id number] reports that she has been diagnosed with MS for years prior, and lived well with the disease. Patient reports she has not been on medication for her MS the majority of the diagnosis and is currently not medicated, by choice.   Associated Signs/Symptoms: Depression Symptoms:  depressed mood, anhedonia, insomnia, psychomotor retardation, fatigue, feelings of worthlessness/guilt, difficulty concentrating, recurrent thoughts of death, anxiety, loss of energy/fatigue, disturbed sleep, (Hypo) Manic Symptoms:   Denies Anxiety Symptoms:   Financial stressor Psychotic Symptoms:   denies PTSD Symptoms: NA  Past Psychiatric History:  INPT: denies OUPT: PCP and Neurology Previous medications: Prozac (failed), Abilify (beneficial) Provigil (beneficial), Cymbalta (failed)  Previous Psychotropic Medications: Yes   Substance Abuse History in the last 12 months:  No.  Consequences of Substance Abuse: NA  Past Medical History:  Past Medical History:  Diagnosis Date   Anemia  Arthritis    on meds   Arthritis 07/06/2017   Constipation    occ   Dehydration 06/09/2016   Depression    on meds   Esophageal reflux 08/08/2012   on meds   GERD (gastroesophageal reflux disease)    on meds   Hypertension    on meds    Inverted nipple    right nipple has always been inverted - per pt   MS (multiple sclerosis) ([place])    Neuromuscular disorder ([place])    MS   Other and unspecified hyperlipidemia 08/08/2012   on meds   Seasonal allergies    Thyroid disease     Past Surgical History:  Procedure Laterality Date   COLONOSCOPY  2020   VC-MAC-prep adeq-TA X 3;   HYSTEROSCOPY  02, 04, 2008   with D&C   ORIF WRIST FRACTURE Right 04/07/2018   Procedure: RIGHT WRIST OPEN REDUCTION INTERNAL FIXATION (ORIF);  Surgeon: Meredith Pel, MD;  Location: [facility];  Service: Orthopedics;  Laterality: Right;   POLYPECTOMY  2020   TA x 3;   TMJ ARTHROSCOPY     TONSILLECTOMY     UPPER GASTROINTESTINAL ENDOSCOPY      Family Psychiatric History:  Denies  Family History:  Family History  Problem Relation Age of Onset   Heart disease Mother        pacemaker   Emphysema Mother    Hypertension Mother    Heart disease Father    Diabetes Father    Heart disease Sister        cad   Sleep apnea Brother    Colon cancer Neg Hx    Esophageal cancer Neg Hx    Rectal cancer Neg Hx    Stomach cancer Neg Hx    Colon polyps Neg Hx     Social History:   Social History   Socioeconomic History   Marital status: Single    Spouse name: Not on file   Number of children: Not on file   Years of education: Not on file   Highest education level: Not on file  Occupational History   Not on file  Tobacco Use   Smoking status: Never   Smokeless tobacco: Never  Vaping Use   Vaping Use: Never used  Substance and Sexual Activity   Alcohol use: Not Currently    Alcohol/week: 0.0 - 1.0 standard drinks of alcohol    Comment: occassional   Drug use: No   Sexual activity: Never    Comment: 1st intercourse 5 yo-5 partners  Other Topics Concern   Not on file  Social History Narrative   Not on file   Social Determinants of Health   Financial Resource Strain: Low Risk  (11/10/2021)   Overall Financial Resource Strain  (CARDIA)    Difficulty of Paying Living Expenses: Not hard at all  Food Insecurity: No Food Insecurity (11/10/2021)   Hunger Vital Sign    Worried About Running Out of Food in the Last Year: Never true    Ran Out of Food in the Last Year: Never true  Transportation Needs: No Transportation Needs (11/10/2021)   PRAPARE - Hydrologist (Medical): No    Lack of Transportation (Non-Medical): No  Physical Activity: Not on file  Stress: No Stress Concern Present (11/10/2021)   [place]    Feeling of Stress : Not at all  Social Connections: Socially Isolated (11/10/2021)  Social Licensed conveyancer [NHANES]    Frequency of Communication with Friends and Family: Once a week    Frequency of Social Gatherings with Friends and Family: Never    Attends Religious Services: Never    Printmaker: No    Attends Music therapist: Never    Marital Status: Never married    Additional Social History:  - lives alone but has family in the area, her 63 yo nephew is close to her and will check in on her most frequently  - Is retired and [place]  - loves watching [place] and [place] the gym  Allergies:   Allergies  Allergen Reactions   Shellfish Allergy Hives, Shortness Of Breath, Itching, Swelling and Rash    Pt had previously carried an epi-pen and had a history of severe reaction to shrimp with breathing problems and swelling of lips and tongue     Metabolic Disorder Labs: Lab Results  Component Value Date   HGBA1C 5.8 02/03/2022   No results found for: "PROLACTIN" Lab Results  Component Value Date   CHOL 175 02/03/2022   TRIG 47.0 02/03/2022   HDL 48.20 02/03/2022   CHOLHDL 4 02/03/2022   VLDL 9.4 02/03/2022   LDLCALC 118 (H) 02/03/2022   LDLCALC 93 07/10/2021   Lab Results  Component Value Date    TSH 0.48 02/03/2022    Therapeutic Level Labs: No results found for: "LITHIUM" No results found for: "CBMZ" No results found for: "VALPROATE"  Current Medications: Current Outpatient Medications  Medication Sig Dispense Refill   ARIPiprazole (ABILIFY) 5 MG tablet Take 1 tablet (5 mg total) by mouth daily. 30 tablet 2   AZO-CRANBERRY PO Take 1 tablet by mouth daily as needed.     brimonidine (ALPHAGAN) 0.2 % ophthalmic solution Place 1 drop into both eyes 2 (two) times daily.     Carboxymethylcellul-Glycerin (LUBRICATING EYE DROPS OP) Place 1 drop into both eyes daily as needed (dry eyes).     cetirizine (ZYRTEC) 10 MG tablet Take 10 mg by mouth every evening.      Cholecalciferol (DIALYVITE VITAMIN D 5000 PO) Take 4,000 Units by mouth daily.      dorzolamide-timolol (COSOPT) 22.3-6.8 MG/ML ophthalmic solution Place 1 drop into both eyes 2 (two) times daily.     fluticasone (FLONASE) 50 MCG/ACT nasal spray USE 2 SPRAY(S) IN EACH NOSTRIL ONCE DAILY AS NEEDED FOR ALLERGIES OR  RHINITIS 16 g 0   furosemide (LASIX) 20 MG tablet Take 1 tablet (20 mg total) by mouth daily as needed. 90 tablet 1   lisinopril-hydrochlorothiazide (ZESTORETIC) 20-25 MG tablet Take 1 tablet by mouth once daily 90 tablet 0   methylphenidate (RITALIN) 20 MG tablet One po qAM and one po qNoon 60 tablet 0   Multiple Vitamin (MULTIVITAMIN) tablet Take 1 tablet by mouth daily.     naproxen sodium (ALEVE) 220 MG tablet Take 220 mg by mouth daily as needed (pain).     Probiotic Product (PROBIOTIC DAILY PO) Take by mouth.     sertraline (ZOLOFT) 100 MG tablet Take 1.5 tablets (150 mg total) by mouth daily. 135 tablet 1   solifenacin (VESICARE) 5 MG tablet Take 1 tablet by mouth once daily 30 tablet 0   No current facility-administered medications for this visit.    Musculoskeletal: Strength & Muscle Tone: decreased Gait & Station:  walks with wheeled roller Patient leans: [place]  Exam: Review of  Systems  Psychiatric/Behavioral:  Positive for dysphoric mood. Negative for hallucinations and suicidal ideas. The patient is not nervous/anxious.     Blood pressure (!) 143/87, pulse 76, resp. rate 12, weight 268 lb (121.6 kg), SpO2 99 %.Body mass index is 43.26 kg/m.  General Appearance: Casual  Eye Contact:  Good  Speech:  Clear and Coherent  Volume:  Normal  Mood:  Depressed  Affect:  Depressed  Thought Process:  Coherent  Orientation:  Full (Time, Place, and Person)  Thought Content:  Logical  Suicidal Thoughts:  No  Homicidal Thoughts:  No  Memory:  Immediate;   Good Recent;   Fair Remote;   Fair  Judgement:  Fair  Insight:  Fair  Psychomotor Activity:  Psychomotor Retardation  Concentration:  Concentration: Fair  Recall:  Good  Fund of Knowledge:Good  Language: Good  Akathisia:  No  Handed:    AIMS (if indicated):  not done  Assets:  Communication Skills Desire for Improvement Housing Resilience Transportation  ADL's:  Intact  Cognition: WNL  Sleep:  Poor   Screenings: GAD-7    Flowsheet Row Office Visit from 02/03/2022 in [place] at [facility]  Total GAD-7 Score 3      [place] from 11/29/2017 in [place] at [facility]  Total Score (max 30 points ) 30      PHQ2-9    [place] Visit from 03/19/2022 in [facility] Counselor from 03/05/2022 in [place] Office Visit from 02/03/2022 in [place] at [facility] from 11/10/2021 in [place] at [facility] Visit from 07/10/2021 in [place] at [facility]  PHQ-2 Total Score _0 PHQ-9 Total Score _1 Flowsheet Row Clinical Support from 11/10/2021 in [place] at [facility] Error: Q3, 4, or 5 should not be populated when Q2 is No       Assessment and Plan: Sydney Pace 66 yo is a patient with a PPH MDD and a PMH of MS. Based on assessment patient appears to have true MDD that is most likely not 2/2 to medical illness as she endorses depressive episodes starting after her mother became ill. Patient had had MS long before this and her MS appears to be fairly stable and not extremely debilitating. Patient herself endorses that her mood is more debilitating and keeping her from being more physically active. Of course, it is always possible that patient MS has led to structural changes in the brain that are making her depression treatment resistant. Patient has had success in the past with adjunct Abilify therapy and is unsure why this was continued. Will try this, but patient may be a candidate for [place] or ECT. Will decrease patient's Wellbutrin XL 319m to decrease polypharamcy and to combat opposing effect that Abilify and wellbutrin have on Dopamine.However, if patient does require dopamine the Abilify could work as a pDatabase administratorand take place for Wellbutrin if this was beneficial for patient in the past. Patient Zoloft will be moved to the AM as this was likely contributing to patient's insomnia.  MDD,recurrent, severe w/o psychosis - Decrease Wellbutrin XL to 15101m(patient will cut her recent refill in half) intend to  titrate off - Start Abilify 43m daily - Change Zoloft 1569mto the AM - Can continue Ritalin 2085mID, per neurology     Collaboration of Care:   Patient/Guardian was advised Release of Information must be obtained prior to any record release in order to collaborate their care with an outside provider. Patient/Guardian was advised if they have not already done so to contact the registration department to sign all necessary forms in order for us [place] release information regarding their care.   Consent: Patient/Guardian gives  verbal consent for treatment and assignment of benefits for services provided during this visit. Patient/Guardian expressed understanding and agreed to proceed.    PGY-3 JaiFreida BusmanD 10/19/20232:38 PM

## 2022-03-26 ENCOUNTER — Telehealth (HOSPITAL_COMMUNITY): Payer: Self-pay | Admitting: Licensed Clinical Social Worker

## 2022-03-26 ENCOUNTER — Ambulatory Visit (HOSPITAL_COMMUNITY): Payer: Medicare (Managed Care) | Admitting: Licensed Clinical Social Worker

## 2022-03-26 NOTE — Telephone Encounter (Signed)
As Koya does not show for her 9 a.m. appointment today, the therapist attempts to reach her by phone leaving a HIPAA-compliant voicemail with his direct callback number for her to call if she would like to reschedule or has any concerns.  [address], [place], LCSW, [facility], LCAS 03/26/2022

## 2022-04-01 ENCOUNTER — Telehealth (HOSPITAL_COMMUNITY): Payer: Self-pay | Admitting: Licensed Clinical Social Worker

## 2022-04-01 NOTE — Telephone Encounter (Signed)
The therapist attempts to return Sydney Pace's call in which she is apparently wanting to reschedule; however, he has to leave a HIPAA-compliant voicemail with the contact number for the Receptionist to assist her in rescheduling such that this therapist and [name] do not end up playing phone tag.  [address], MA, LCSW, [facility], LCAS 04/01/2022

## 2022-04-05 ENCOUNTER — Other Ambulatory Visit: Payer: Self-pay | Admitting: Family Medicine

## 2022-04-07 ENCOUNTER — Telehealth (HOSPITAL_COMMUNITY): Payer: Self-pay | Admitting: Licensed Clinical Social Worker

## 2022-04-07 NOTE — Telephone Encounter (Signed)
The therapist attempts to return Sydney Pace's call leaving a HIPAA-compliant voicemail.  Adam Phenix, [place], LCSW, [facility], [place] 04/07/2022

## 2022-04-13 ENCOUNTER — Ambulatory Visit (HOSPITAL_COMMUNITY): Payer: Medicare (Managed Care) | Admitting: Student in an Organized Health Care Education/Training Program

## 2022-04-14 ENCOUNTER — Telehealth (HOSPITAL_COMMUNITY): Payer: Self-pay | Admitting: Licensed Clinical Social Worker

## 2022-04-14 ENCOUNTER — Ambulatory Visit (HOSPITAL_COMMUNITY): Payer: Medicare (Managed Care) | Admitting: Licensed Clinical Social Worker

## 2022-04-14 NOTE — Telephone Encounter (Signed)
Charlot calls leaving a voicemail that she will not be at her appointment today as she cannot locate her car keys and is upset as she really needed to be seen today.  Adam Phenix, [place], LCSW, [facility], [place] 04/14/2022

## 2022-04-14 NOTE — Telephone Encounter (Signed)
The therapist attempts to reach Sydney Pace leaving a HIPAA-compliant voicemail confirming that he received her message this morning and reminding her of her future appointment with Dr. Candie Chroman in addition to providing the contact number for the scheduling desk and this therapist's direct contact number should she have questions or concerns.  Adam Phenix, [place], LCSW, [facility], [place] 04/14/2022

## 2022-04-16 ENCOUNTER — Ambulatory Visit (HOSPITAL_COMMUNITY): Payer: Medicare (Managed Care) | Admitting: Licensed Clinical Social Worker

## 2022-04-30 ENCOUNTER — Other Ambulatory Visit: Payer: Self-pay | Admitting: Family Medicine

## 2022-05-04 ENCOUNTER — Ambulatory Visit (HOSPITAL_BASED_OUTPATIENT_CLINIC_OR_DEPARTMENT_OTHER): Payer: Medicare (Managed Care) | Admitting: Student in an Organized Health Care Education/Training Program

## 2022-05-04 ENCOUNTER — Encounter (HOSPITAL_COMMUNITY): Payer: Self-pay | Admitting: Student in an Organized Health Care Education/Training Program

## 2022-05-04 DIAGNOSIS — F332 Major depressive disorder, recurrent severe without psychotic features: Secondary | ICD-10-CM

## 2022-05-04 MED ORDER — ARIPIPRAZOLE 5 MG PO TABS: 5.0000 mg | ORAL_TABLET | Freq: Every day | ORAL | 3 refills | Status: DC

## 2022-05-04 MED ORDER — SERTRALINE HCL 100 MG PO TABS: 300.0000 mg | ORAL_TABLET | Freq: Every day | ORAL | 3 refills | Status: DC

## 2022-05-04 NOTE — Progress Notes (Signed)
BH MD/PA/NP OP Progress Note  05/04/2022 6:32 PM Sydney Pace  MRN:  [id number]  Chief Complaint:  Chief Complaint  Patient presents with   Follow-up   HPI: Sydney Pace 66 yo is a patient with a [place] MDD and a PMH of MS. Patient reports that she is recommended the following regimen:   Ritalin 15m BID( per Neurology for low energy 2/2 MS)-will take 1-2 times per day Abilify 5 mg daily Zoloft 1526mdaily  Objectively, patient appears to be moving very slowly today and has difficulty going from sitting to standing.  Patient appears to have very dysphoric affect.  Patient reports she has not been sleeping well endorsing that she is constantly staying awake thinking about how she does not feel like she is where she wants to be in life.  Patient reports that she wishes she had more "pep" or energy.  Patient reports that "today is not a good day" but endorse that she is not quite sure why.  Patient reports that she feels like she was doing okay prior to her appointment today.  Patient reports that she does feel the Abilify has been extremely beneficial in making her feel more alert and "with it."  Patient reports that her appetite has been low, and upon further review patient discloses that she is having food insecurity.  Patient and provider discussed the local food bank and food market.  Patient endorses that having this information has made her feel drastically better.  Patient reports that her nephew has been paying for her medication and is her biggest support system.  Patient does not endorse SI, HI or AVH.  Patient reports that she has been applying for a new insurance that may help her with her food insecurity in the next year.  Patient and provider also discussed, that patient may want to go to the local food market, to get out of her home and meet other individuals as she endorsed that she was longing for this.  Patient reports that she is not sure about this however, she will attempt to  go at least 1 time in the next month.   Interestingly, the patient stated many times throughout the assessment that she was wondering whether or not her current presentation and low mood and struggling to get around was due to not eating this morning.  Visit Diagnosis:    ICD-10-CM   1. Severe episode of recurrent major depressive disorder, without psychotic features (H[place] F33.2 sertraline (ZOLOFT) 100 MG tablet    ARIPiprazole (ABILIFY) 5 MG tablet      Past Psychiatric History: INPT: denies OUPT: PCP and Neurology Previous medications: Prozac (failed), Abilify (beneficial) Provigil (beneficial), Cymbalta (failed)  Last visit: 03/19/2022-patient titrated off of Wellbutrin.  Patient continued on neurology prescribed Ritalin, Zoloft, patient was started on Abilify 5 mg for treatment resistant depression  Past Medical History:  Past Medical History:  Diagnosis Date   Anemia    Arthritis    on meds   Arthritis 07/06/2017   Constipation    occ   Dehydration 06/09/2016   Depression    on meds   Esophageal reflux 08/08/2012   on meds   GERD (gastroesophageal reflux disease)    on meds   Hypertension    on meds   Inverted nipple    right nipple has always been inverted - per pt   MS (multiple sclerosis) ([place]   Neuromuscular disorder ([facility]   MS   Other and unspecified  hyperlipidemia 08/08/2012   on meds   Seasonal allergies    Thyroid disease     Past Surgical History:  Procedure Laterality Date   COLONOSCOPY  2020   VC-MAC-prep adeq-TA X 3;   HYSTEROSCOPY  02, 04, 2008   with D&C   ORIF WRIST FRACTURE Right 04/07/2018   Procedure: RIGHT WRIST OPEN REDUCTION INTERNAL FIXATION (ORIF);  Surgeon: Meredith Pel, MD;  Location: [facility];  Service: Orthopedics;  Laterality: Right;   POLYPECTOMY  2020   TA x 3;   TMJ ARTHROSCOPY     TONSILLECTOMY     UPPER GASTROINTESTINAL ENDOSCOPY      Family Psychiatric History: Denies  Family History:  Family History   Problem Relation Age of Onset   Heart disease Mother        pacemaker   Emphysema Mother    Hypertension Mother    Heart disease Father    Diabetes Father    Heart disease Sister        cad   Sleep apnea Brother    Colon cancer Neg Hx    Esophageal cancer Neg Hx    Rectal cancer Neg Hx    Stomach cancer Neg Hx    Colon polyps Neg Hx     Social History:  Social History   Socioeconomic History   Marital status: Single    Spouse name: Not on file   Number of children: Not on file   Years of education: Not on file   Highest education level: Not on file  Occupational History   Not on file  Tobacco Use   Smoking status: Never   Smokeless tobacco: Never  Vaping Use   Vaping Use: Never used  Substance and Sexual Activity   Alcohol use: Not Currently    Alcohol/week: 0.0 - 1.0 standard drinks of alcohol    Comment: occassional   Drug use: No   Sexual activity: Never    Comment: 1st intercourse 81 yo-5 partners  Other Topics Concern   Not on file  Social History Narrative   Not on file   Social Determinants of Health   Financial Resource Strain: Low Risk  (11/10/2021)   Overall Financial Resource Strain (CARDIA)    Difficulty of Paying Living Expenses: Not hard at all  Food Insecurity: No Food Insecurity (11/10/2021)   Hunger Vital Sign    Worried About Running Out of Food in the Last Year: Never true    Ran Out of Food in the Last Year: Never true  Transportation Needs: No Transportation Needs (11/10/2021)   PRAPARE - Hydrologist (Medical): No    Lack of Transportation (Non-Medical): No  Physical Activity: Not on file  Stress: No Stress Concern Present (11/10/2021)   [place]    Feeling of Stress : Not at all  Social Connections: Socially Isolated (11/10/2021)   Social Connection and Isolation Panel [NHANES]    Frequency of Communication with Friends and Family: Once a  week    Frequency of Social Gatherings with Friends and Family: Never    Attends Religious Services: Never    Marine scientist or Organizations: No    Attends Archivist Meetings: Never    Marital Status: Never married    Allergies:  Allergies  Allergen Reactions   Shellfish Allergy Hives, Shortness Of Breath, Itching, Swelling and Rash    Pt had previously carried an epi-pen and  had a history of severe reaction to shrimp with breathing problems and swelling of lips and tongue     Metabolic Disorder Labs: Lab Results  Component Value Date   HGBA1C 5.8 02/03/2022   No results found for: "PROLACTIN" Lab Results  Component Value Date   CHOL 175 02/03/2022   TRIG 47.0 02/03/2022   HDL 48.20 02/03/2022   CHOLHDL 4 02/03/2022   VLDL 9.4 02/03/2022   LDLCALC 118 (H) 02/03/2022   LDLCALC 93 07/10/2021   Lab Results  Component Value Date   TSH 0.48 02/03/2022   TSH 0.51 07/10/2021    Therapeutic Level Labs: No results found for: "LITHIUM" No results found for: "VALPROATE" No results found for: "CBMZ"  Current Medications: Current Outpatient Medications  Medication Sig Dispense Refill   ARIPiprazole (ABILIFY) 5 MG tablet Take 1 tablet (5 mg total) by mouth daily. 30 tablet 3   AZO-CRANBERRY PO Take 1 tablet by mouth daily as needed.     brimonidine (ALPHAGAN) 0.2 % ophthalmic solution Place 1 drop into both eyes 2 (two) times daily.     Carboxymethylcellul-Glycerin (LUBRICATING EYE DROPS OP) Place 1 drop into both eyes daily as needed (dry eyes).     cetirizine (ZYRTEC) 10 MG tablet Take 10 mg by mouth every evening.      Cholecalciferol (DIALYVITE VITAMIN D 5000 PO) Take 4,000 Units by mouth daily.      dorzolamide-timolol (COSOPT) 22.3-6.8 MG/ML ophthalmic solution Place 1 drop into both eyes 2 (two) times daily.     fluticasone (FLONASE) 50 MCG/ACT nasal spray USE 2 SPRAY(S) IN EACH NOSTRIL ONCE DAILY AS NEEDED FOR ALLERGIES OR  RHINITIS 16 g 0    furosemide (LASIX) 20 MG tablet Take 1 tablet (20 mg total) by mouth daily as needed. 90 tablet 1   lisinopril-hydrochlorothiazide (ZESTORETIC) 20-25 MG tablet Take 1 tablet by mouth daily. 90 tablet 0   methylphenidate (RITALIN) 20 MG tablet One po qAM and one po qNoon 60 tablet 0   Multiple Vitamin (MULTIVITAMIN) tablet Take 1 tablet by mouth daily.     naproxen sodium (ALEVE) 220 MG tablet Take 220 mg by mouth daily as needed (pain).     Probiotic Product (PROBIOTIC DAILY PO) Take by mouth.     sertraline (ZOLOFT) 100 MG tablet Take 3 tablets (300 mg total) by mouth daily. 135 tablet 3   solifenacin (VESICARE) 5 MG tablet Take 1 tablet by mouth once daily 30 tablet 0   No current facility-administered medications for this visit.     Musculoskeletal: Strength & Muscle Tone: decreased Gait & Station:  Using a rolling walker Patient leans: Front  Psychiatric Specialty Exam: Review of Systems  Psychiatric/Behavioral:  Positive for dysphoric mood. Negative for hallucinations and suicidal ideas.     Blood pressure (!) 149/92, pulse 88, weight 264 lb (119.7 kg), SpO2 99 %.Body mass index is 42.61 kg/m.  General Appearance: Casual  Eye Contact:  Minimal  Speech:  Clear and Coherent  Volume:  Normal almost things only are hopeless/exacerbated  Mood:  Depressed  Affect:  Depressed  Thought Process:  Goal Directed  Orientation:  Full (Time, Place, and Person)  Thought Content: Logical   Suicidal Thoughts:  No  Homicidal Thoughts:  No  Memory:  Immediate;   Good Recent;   Fair  Judgement:  Fair  Insight:  Shallow  Psychomotor Activity:  Decreased  Concentration:  Concentration: Poor  Recall:  NA  Fund of Knowledge: Fair  Language: Good  Akathisia:  NA  Handed:    AIMS (if indicated): not done  Assets:  Communication Skills Desire for Improvement Resilience Social Support  ADL's:  Intact  Cognition: WNL  Sleep:  Poor   Screenings: GAD-7    Flowsheet Row Office Visit  from 02/03/2022 in [place] at [facility]  Total GAD-7 Score 3      [place] from 11/29/2017 in [place] at [facility]  Total Score (max 30 points ) 30      PHQ2-9    [place] Visit from 03/19/2022 in [facility] Counselor from 03/05/2022 in [place] Office Visit from 02/03/2022 in [place] at [facility] from 11/10/2021 in [place] at [facility] Visit from 07/10/2021 in [place] at [facility]  PHQ-2 Total Score _0 PHQ-9 Total Score _1 Flowsheet Row Clinical Support from 11/10/2021 in [place] at [facility] Error: Q3, 4, or 5 should not be populated when Q2 is No        Assessment and Plan:  Sydney Pace 66 yo is a patient with a PPH MDD and a PMH of MS.  Patient appeared much more depressed and dysphoric today, however she endorsed that in between her last visit and today her mood had overall improved with Abilify, and patient questioned whether or not she needed increased dose.  Based on patient's presentation, it is likely better to optimize her Zoloft.  Although patient appeared depressed, it seemed like a possible significant trigger with food insecurity.  Patient did mention multiple times throughout assessment (passively) that she did not have breakfast this morning and this may be contributing to her current presentation.  At the end of the assessment, patient did endorse that she felt better after telling someone that she needed help with food.  MDD, recurrent, severe W/O psychosis - Continue Abilify 5 mg daily - Increase Zoloft to 300 mg daily - Can continue Ritalin 20 mg twice daily, per  neurology  Follow-up in approximately 1 month  Patient was also provided with 2 local resources for food insecurity including the local church at 5 minutes from her house.  Collaboration of Care: Collaboration of Care:   Patient/Guardian was advised Release of Information must be obtained prior to any record release in order to collaborate their care with an outside provider. Patient/Guardian was advised if they have not already done so to contact the registration department to sign all necessary forms in order for [place] to release information regarding their care.   Consent: Patient/Guardian gives verbal consent for treatment and assignment of benefits for services provided during this visit. Patient/Guardian expressed understanding and agreed to proceed.   PGY-3 Freida Busman, MD 05/04/2022, 6:32 PM

## 2022-05-04 NOTE — Patient Instructions (Addendum)
[facility] has been giving out food to those in need for over 30 years. Generally, we give out over 1,500 bags of food to almost 800 households each year. We serve the [place] community and are open Monday - Friday from 10:00am to 1:00pm.  Licensed conveyancer (indoor market) [address] [place], [place] [address]  Saturdays 7:30a- 12pm

## 2022-05-06 ENCOUNTER — Ambulatory Visit (INDEPENDENT_AMBULATORY_CARE_PROVIDER_SITE_OTHER): Payer: Medicare (Managed Care) | Admitting: Licensed Clinical Social Worker

## 2022-05-06 DIAGNOSIS — F332 Major depressive disorder, recurrent severe without psychotic features: Secondary | ICD-10-CM | POA: Diagnosis not present

## 2022-05-06 NOTE — Progress Notes (Signed)
THERAPIST PROGRESS NOTE  Session Time: 1:05 p.m. to 2 p.m.  Type of Therapy: Individual   Therapist Response/Interventions: Solution-focused/The therapist introduces Timea to [employer] of needs and encourages her to buy the food and necessities that she needs and then, if there is left over money, she can pay her creditors from that amount but that her necessities come first. He also recommends that she can talk to a Engineer, materials at [employer] to write letters such that she is no longer harassed by creditors. He notes that Kenesha should not worry about tithing at [place] does not expect the poor to do so as the [employer] the poor.    Treatment Goals addressed: Julieann will experience a decrease in her depression as evidenced by her PHQ-9 dropping from a 15 to a 4 or less in addition to [facility] (update due 09/04/2022.)  Summary: Lakiesha presents saying that she has been to [facility] but [facility] not [facility] as her motivation is not there yet. She says that she cannot afford food and does not have enough food to last all month. She says that her nephew knows about her food insecurity but she says, "I can't keep depending on him."She says that she is in debt and paying back from being scammed. She admits that she has not been eating properly for months due to trying to pay her creditors and worrying about tithing at her [place].  At the conclusion of the session, she agrees to spend her next check first on getting sufficient food for the month and, after doing so, will send creditors any left over money if there is any. As things stand, she has to make a house payment which takes 40% of her Social Security check which is essentially not sustainable.   Her mood symptoms are documented in Dr. Ellan Lambert not from two days ago with no appreciate change as of today.   Progress Towards Goals: Not  progressing  Suicidal/Homicidal: No SI or HI  Plan: Return again in 1 weeks.  Diagnosis: Major Depression, Severe, Single Episode  Collaboration of Care: Other N/A  Patient/Guardian was advised Release of Information must be obtained prior to any record release in order to collaborate their care with an outside provider. Patient/Guardian was advised if they have not already done so to contact the registration department to sign all necessary forms in order for [place] to release information regarding their care.   Consent: Patient/Guardian gives verbal consent for treatment and assignment of benefits for services provided during this visit. Patient/Guardian expressed understanding and agreed to proceed.   Adam Phenix, [place], LCSW, [facility], [place] 05/06/2022

## 2022-05-14 ENCOUNTER — Ambulatory Visit (INDEPENDENT_AMBULATORY_CARE_PROVIDER_SITE_OTHER): Payer: Medicare (Managed Care) | Admitting: Neurology

## 2022-05-14 ENCOUNTER — Encounter: Payer: Self-pay | Admitting: Neurology

## 2022-05-14 VITALS — BP 167/94 | HR 92 | Ht 66.0 in | Wt 274.0 lb

## 2022-05-14 DIAGNOSIS — N3281 Overactive bladder: Secondary | ICD-10-CM

## 2022-05-14 DIAGNOSIS — F988 Other specified behavioral and emotional disorders with onset usually occurring in childhood and adolescence: Secondary | ICD-10-CM

## 2022-05-14 DIAGNOSIS — R260 Ataxic gait: Secondary | ICD-10-CM

## 2022-05-14 DIAGNOSIS — G35 Multiple sclerosis: Secondary | ICD-10-CM

## 2022-05-14 DIAGNOSIS — F32A Depression, unspecified: Secondary | ICD-10-CM | POA: Diagnosis not present

## 2022-05-14 DIAGNOSIS — G2581 Restless legs syndrome: Secondary | ICD-10-CM | POA: Diagnosis not present

## 2022-05-14 MED ORDER — METHYLPHENIDATE HCL 20 MG PO TABS: ORAL_TABLET | ORAL | 0 refills | Status: DC

## 2022-05-14 MED ORDER — MIRABEGRON ER 50 MG PO TB24: 50.0000 mg | ORAL_TABLET | Freq: Every day | ORAL | 11 refills | Status: DC

## 2022-05-14 NOTE — Progress Notes (Signed)
u  [facility]  PATIENT: Sydney Pace DOB: 18-Jul-1955  REFERRING DOCTOR OR PCP:  Penni Homans  _________________________________   HISTORICAL  CHIEF COMPLAINT:  Chief Complaint  Patient presents with   Follow-up    RM 10, alone. Last seen 11/05/21. Ambulates with rolling walker. No falls since last visit. No new sx. Had eye visit in the last yr/sees someone in [place], [place].  Fatigued most of the time. Forgot BP med today, BP high.      HISTORY OF PRESENT ILLNESS:  Sydney Pace is a 66 y.o. woman with multiple sclerosis.      Update 05/14/2022 She feels her MS is stable though she has slightly progressed.    She has not had any MS exacerbations and has been off DMTs since late 2014   MRIs between 2007 and 2018 were stable.      Gait is off balanced.  She just started a Rollator style walker and feels more stable than using a cane.     She has dysesthetic pain, worse in R > L leg.    Gabapentin only helps a little bit.     She has urge incontinence that has gradually worsened.   Oxybutynin did not help.  Solifenacin helping a bit only.  No recent UTI's.    Vision is stable.     She continues to note a lot of depression.  Her sister in law died early this year (she was very close) and has lost several family members over the last year.    She is on Abilify and Zoloft.  She used to do counseling more than 4 years ago but no one recently.   These have helped but incompletely.  She does not note anxiety or agitation.    She was unable to get an appt for behavioral health.     She has poor focus and attention.   She feels it is and it is helped quite a bit by methylphenidate.    She tolerates it well.   She takes at least one every day and two some days.     She has insomnia.   The RLS is doing better.   She takes gabapentin 300 mg at bedtime only.   She often sleeps in her chair if she dozes off watching TV.      We discussed doing more walking and exercise.   She  walks some in the ne[place] and does some chair exercises.     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  MRI was performed in 1983 and was consistent with MS.     In the late 1990's, she was started on Copaxone but stopped after several weeks due to skin reactions. She also tried Avonex but had a rash and stopped. She started Gilenya in January 2014 but stopped after 7 or 8 months due to being more fatigue and having flulike symptoms. In May 2015 we had discussed Aubagio. She decided not to start.     MRI 02/28/2017 showed Multiple T2/FLAIR hyperintense foci in the hemispheres and thalamus in a pattern and configuration consistent with chronic demyelinating plaque associated with multiple sclerosis. None of the foci appears to be acute. When compared to the MRI dated 04/03/2006, there is no significant interval change.      Generalized cortical atrophy and corpus callosum atrophy  REVIEW OF SYSTEMS: Constitutional: No fevers, chills, sweats, or change  in appetite.  She has fatigue and insomnia Eyes: see above.  No double vision, eye pain..  Some eye redness Ear, nose and throat: No hearing loss, ear pain, nasal congestion, sore throat Cardiovascular: No chest pain, palpitations Respiratory:  No shortness of breath at rest or with exertion.   No wheezes GastrointestinaI: No nausea, vomiting, diarrhea, abdominal pain, fecal incontinence Genitourinary:  Mild urinary frequency.  No nocturia. Musculoskeletal:  No neck pain, back pain.  Some hip pain Integumentary: No rash, pruritus, skin lesions Neurological: as above Psychiatric: Mild depression at this time, rarely cries, less anxiety Endocrine: No palpitations, diaphoresis, change in appetite, change in weigh or increased thirst Hematologic/Lymphatic:  No anemia, purpura, petechiae. Allergic/Immunologic: No itchy/runny eyes, nasal congestion, recent allergic reactions,  rashes  ALLERGIES: Allergies  Allergen Reactions   Shellfish Allergy Hives, Shortness Of Breath, Itching, Swelling and Rash    Pt had previously carried an epi-pen and had a history of severe reaction to shrimp with breathing problems and swelling of lips and tongue     HOME MEDICATIONS:  Current Outpatient Medications:    ARIPiprazole (ABILIFY) 5 MG tablet, Take 1 tablet (5 mg total) by mouth daily., Disp: 30 tablet, Rfl: 3   AZO-CRANBERRY PO, Take 1 tablet by mouth daily as needed., Disp: , Rfl:    brimonidine (ALPHAGAN) 0.2 % ophthalmic solution, Place 1 drop into both eyes 2 (two) times daily., Disp: , Rfl:    Carboxymethylcellul-Glycerin (LUBRICATING EYE DROPS OP), Place 1 drop into both eyes daily as needed (dry eyes)., Disp: , Rfl:    cetirizine (ZYRTEC) 10 MG tablet, Take 10 mg by mouth every evening. , Disp: , Rfl:    Cholecalciferol (DIALYVITE VITAMIN D 5000 PO), Take 4,000 Units by mouth daily. , Disp: , Rfl:    dorzolamide-timolol (COSOPT) 22.3-6.8 MG/ML ophthalmic solution, Place 1 drop into both eyes 2 (two) times daily., Disp: , Rfl:    fluticasone (FLONASE) 50 MCG/ACT nasal spray, USE 2 SPRAY(S) IN EACH NOSTRIL ONCE DAILY AS NEEDED FOR ALLERGIES OR  RHINITIS, Disp: 16 g, Rfl: 0   furosemide (LASIX) 20 MG tablet, Take 1 tablet (20 mg total) by mouth daily as needed., Disp: 90 tablet, Rfl: 1   lisinopril-hydrochlorothiazide (ZESTORETIC) 20-25 MG tablet, Take 1 tablet by mouth daily., Disp: 90 tablet, Rfl: 0   mirabegron ER (MYRBETRIQ) 50 MG TB24 tablet, Take 1 tablet (50 mg total) by mouth daily., Disp: 30 tablet, Rfl: 11   Multiple Vitamin (MULTIVITAMIN) tablet, Take 1 tablet by mouth daily., Disp: , Rfl:    naproxen sodium (ALEVE) 220 MG tablet, Take 220 mg by mouth daily as needed (pain)., Disp: , Rfl:    Probiotic Product (PROBIOTIC DAILY PO), Take by mouth., Disp: , Rfl:    sertraline (ZOLOFT) 100 MG tablet, Take 3 tablets (300 mg total) by mouth daily., Disp: 135 tablet,  Rfl: 3   solifenacin (VESICARE) 5 MG tablet, Take 1 tablet by mouth once daily, Disp: 30 tablet, Rfl: 0   methylphenidate (RITALIN) 20 MG tablet, One po qAM and one po qNoon, Disp: 60 tablet, Rfl: 0  PAST MEDICAL HISTORY: Past Medical History:  Diagnosis Date   Anemia    Arthritis    on meds   Arthritis 07/06/2017   Constipation    occ   Dehydration 06/09/2016   Depression    on meds   Esophageal reflux 08/08/2012   on meds   GERD (gastroesophageal reflux disease)    on meds   Hypertension  on meds   Inverted nipple    right nipple has always been inverted - per pt   MS (multiple sclerosis) ([place])    Neuromuscular disorder ([place])    MS   Other and unspecified hyperlipidemia 08/08/2012   on meds   Seasonal allergies    Thyroid disease     PAST SURGICAL HISTORY: Past Surgical History:  Procedure Laterality Date   COLONOSCOPY  2020   VC-MAC-prep adeq-TA X 3;   HYSTEROSCOPY  02, 04, 2008   with D&C   ORIF WRIST FRACTURE Right 04/07/2018   Procedure: RIGHT WRIST OPEN REDUCTION INTERNAL FIXATION (ORIF);  Surgeon: Meredith Pel, MD;  Location: [facility];  Service: Orthopedics;  Laterality: Right;   POLYPECTOMY  2020   TA x 3;   TMJ ARTHROSCOPY     TONSILLECTOMY     UPPER GASTROINTESTINAL ENDOSCOPY      FAMILY HISTORY: Family History  Problem Relation Age of Onset   Heart disease Mother        pacemaker   Emphysema Mother    Hypertension Mother    Heart disease Father    Diabetes Father    Heart disease Sister        cad   Sleep apnea Brother    Colon cancer Neg Hx    Esophageal cancer Neg Hx    Rectal cancer Neg Hx    Stomach cancer Neg Hx    Colon polyps Neg Hx     SOCIAL HISTORY:  Social History   Socioeconomic History   Marital status: Single    Spouse name: Not on file   Number of children: Not on file   Years of education: Not on file   Highest education level: Not on file  Occupational History   Not on file  Tobacco Use   Smoking  status: Never   Smokeless tobacco: Never  Vaping Use   Vaping Use: Never used  Substance and Sexual Activity   Alcohol use: Not Currently    Alcohol/week: 0.0 - 1.0 standard drinks of alcohol    Comment: occassional   Drug use: No   Sexual activity: Never    Comment: 1st intercourse 72 yo-5 partners  Other Topics Concern   Not on file  Social History Narrative   Not on file   Social Determinants of Health   Financial Resource Strain: Low Risk  (11/10/2021)   Overall Financial Resource Strain (CARDIA)    Difficulty of Paying Living Expenses: Not hard at all  Food Insecurity: No Food Insecurity (11/10/2021)   Hunger Vital Sign    Worried About Running Out of Food in the Last Year: Never true    Ran Out of Food in the Last Year: Never true  Transportation Needs: No Transportation Needs (11/10/2021)   PRAPARE - Hydrologist (Medical): No    Lack of Transportation (Non-Medical): No  Physical Activity: Not on file  Stress: No Stress Concern Present (11/10/2021)   [place]    Feeling of Stress : Not at all  Social Connections: Socially Isolated (11/10/2021)   Social Connection and Isolation Panel [NHANES]    Frequency of Communication with Friends and Family: Once a week    Frequency of Social Gatherings with Friends and Family: Never    Attends Religious Services: Never    Marine scientist or Organizations: No    Attends Archivist Meetings: Never  Marital Status: Never married  Intimate Partner Violence: Not At Risk (11/10/2021)   Humiliation, Afraid, Rape, and Kick questionnaire    Fear of Current or Ex-Partner: No    Emotionally Abused: No    Physically Abused: No    Sexually Abused: No     PHYSICAL EXAM  Vitals:   05/14/22 0959  BP: (!) 167/94  Pulse: 92  Weight: 274 lb (124.3 kg)  Height: _0  (1.676 m)    Body mass index is 44.22 kg/m.   General:  The patient is well-developed and well-nourished and in no acute distress  Neurologic Exam  Mental status: The patient is alert and oriented x 3 at the time of the examination. The patient has apparent normal recent and remote memory, with an apparently normal attention span and concentration ability.   Speech is normal.  Cranial nerves: Extraocular movements are full.  She has reduced color vision on the left.  Facial strength was normal.   No dysarthria is noted.   Hearing seems normal.   Motor:  Muscle bulk is normal.  Strength is 5/5 except for 4/5 strength in the ulnar innervated hand muscles and 4+/5 in the EHL muscles.  Sensory: Symmetric sensation in proximal arms to touch, temp and vibration.  But reduced left leg temperature sensation.   Coordination: Cerebellar testing reveals good finger-nose-finger bilaterally.  Gait and station: She needs to use her arms to stand up but once up can stand without support.   She has hip/leg pain.  She has a reduced stride and wide gait.  She is able to take steps without her walker.  With her walker her stride is much better.  She cannot do a tandem walk.  Romberg is negative.  Reflexes: Deep tendon reflexes are symmetric and normal in arms, 3+ at knees with mild spread and 2 at the ankles.Marland Kitchen        DIAGNOSTIC DATA (LABS, IMAGING, TESTING) - I reviewed patient records, labs, notes, testing and imaging myself where available.  Lab Results  Component Value Date   WBC 6.0 02/03/2022   HGB 12.3 02/03/2022   HCT 36.6 02/03/2022   MCV 90.6 02/03/2022   PLT 225.0 02/03/2022      Component Value Date/Time   NA 138 02/03/2022 1535   K 4.2 02/03/2022 1535   CL 102 02/03/2022 1535   CO2 29 02/03/2022 1535   GLUCOSE 81 02/03/2022 1535   BUN 20 02/03/2022 1535   CREATININE 1.08 02/03/2022 1535   CREATININE 0.91 04/22/2016 1730   CALCIUM 10.3 02/03/2022 1535   PROT 7.5 02/03/2022 1535   ALBUMIN 3.8 02/03/2022 1535   AST 14 02/03/2022 1535    ALT 9 02/03/2022 1535   ALKPHOS 99 02/03/2022 1535   BILITOT 0.6 02/03/2022 1535   GFRNONAA 53 (L) 04/07/2018 1330   GFRAA >60 04/07/2018 1330   Lab Results  Component Value Date   CHOL 175 02/03/2022   HDL 48.20 02/03/2022   LDLCALC 118 (H) 02/03/2022   TRIG 47.0 02/03/2022   CHOLHDL 4 02/03/2022   Lab Results  Component Value Date   HGBA1C 5.8 02/03/2022   Lab Results  Component Value Date   UMPNTIRW43 154 07/25/2019   Lab Results  Component Value Date   TSH 0.48 02/03/2022       ASSESSMENT AND PLAN  Multiple sclerosis (HCC)  Ataxic gait  Overactive bladder  Attention deficit disorder (ADD) in adult  Depression, unspecified depression type  Restless leg syndrome   1.  She will remain off of a disease modifying therapy.  She has had no exacerbations for many years.  She prefers not to do another MRI (previous MRi 2018)  2.   Continue Ritalin for attention, wakefulness and fatigue 3.   Continue gabapentin for restless leg syndrome.  Continue Zoloft and Abilify for mood.   4.   Continue Vit D supplements and stay active. 5.   Stay active.   Try to walk daily out of the house in good weather 6.   Add Myrbetriq to solifenacin (has failed oxybutynin and solifenacin monotherapy). 7.  She will return to see [place] in 6 months or sooner if she has new or worsening neurologic symptoms.   Tekisha Darcey A. Felecia Shelling, MD, PhD 07/37/1062, 69:48 AM Certified in Neurology, Clinical Neurophysiology, Sleep Medicine, Pain Medicine and Neuroimaging  [facility] [address] [place],  [place] [address] [phone]

## 2022-05-21 ENCOUNTER — Ambulatory Visit (INDEPENDENT_AMBULATORY_CARE_PROVIDER_SITE_OTHER): Payer: Medicare (Managed Care) | Admitting: Licensed Clinical Social Worker

## 2022-05-21 ENCOUNTER — Other Ambulatory Visit: Payer: Self-pay | Admitting: Neurology

## 2022-05-21 ENCOUNTER — Encounter: Payer: Self-pay | Admitting: Neurology

## 2022-05-21 DIAGNOSIS — F332 Major depressive disorder, recurrent severe without psychotic features: Secondary | ICD-10-CM

## 2022-05-21 MED ORDER — AMPHETAMINE-DEXTROAMPHETAMINE 20 MG PO TABS: 20.0000 mg | ORAL_TABLET | Freq: Every day | ORAL | 0 refills | Status: DC

## 2022-05-21 NOTE — Progress Notes (Signed)
THERAPIST PROGRESS NOTE  Session Time: 10:10 a.m. to 11:10 a.m.  Type of Therapy: Individual   Therapist Response/Interventions: Solution-focused and CBT/The therapist reminds Sydney [facility] at [facility] and provides her with the contact number for [employer] [facility] [place] to talk with them about her needed home repairs and other resources.   The therapist reviews Sydney Pace's food intake to assure she is eating regularly and eating a balanced diet. He messages her Neurologist to let him know that she cannot get her Ritalin and encourages her to let the doctor prescribing her bladder medication know if the medication is cost prohibitive.   The therapist validates Sydney Pace's feelings about not wanting to get out while noting that trying to attending the large gathering at her cousin's and her nephew's Xmas would seem like a lot; however, attending her nephew's small Xmas gathering may be a smaller goal to which to aim. He conducts a risk assessment given her answers on the PHQ-9 and notes that her medication is working optimally as evidenced by a PHQ-9 score of 16.   Treatment Goals addressed: Sydney Pace will experience a decrease in her depression as evidenced by her PHQ-9 dropping from a 15 to a 4 or less in addition to [facility] (update due 09/04/2022.)  Summary: Sydney Pace presents saying that she is not having a good day physically or emotionally. She has been unable to get her Ritalin since the weekend so has a lack of energy. She says that she did extremely well in the past on Provigil; however, she had to switch to Ritalin as she could not afford the Provigil. She is sleeping 2-3 hours at a time for a total of 4-5 hours per night. She says that when she first started treatment at this office that she used to have SI daily but now only on occasions. She says that she had brief thoughts over the  weekend that she would be better off if  not here.   She is now eating at least two meals per day and sounds to be eating a balanced diet. She notes that her thinking is more clear now that she is eating. She went to a Xmas play but has not been to [place] in two weeks due to the weather and admits that she may not attend the Xmas events to which she is invited due to her depression. She is upset as she needs a new roof, her ramp repaired, and other home repairs yet cannot afford to get them done.   Progress Towards Goals: Not progressing  Suicidal/Homicidal: She reports some fleeting thoughts of suicide with the last time being this past weekend. She denies any plan or intent noting that not knowing how she would do it and thinking about her nephew are things that would prevent her from acting. She denies any HI.   Plan: Return again p.r.n.  Diagnosis: Major Depression, Severe, Single Episode  Collaboration of Care: Message her Neurologist about the inability to get Ritalin and provide Dr. Candie Chroman a summary of the visit while letting her know that [place] did not schedule a return appointment and may need to be urged to do so by her.   Patient/Guardian was advised Release of Information must be obtained prior to any record release in order to collaborate their care with an outside provider. Patient/Guardian was advised if they have not already done so to contact the registration department to sign all necessary forms  in order for [place] to release information regarding their care.   Consent: Patient/Guardian gives verbal consent for treatment and assignment of benefits for services provided during this visit. Patient/Guardian expressed understanding and agreed to proceed.   Adam Phenix, [place], LCSW, [facility], [place] 05/21/2022

## 2022-05-28 ENCOUNTER — Telehealth: Payer: Self-pay | Admitting: Family Medicine

## 2022-05-28 NOTE — Telephone Encounter (Signed)
Please advise 

## 2022-05-28 NOTE — Telephone Encounter (Signed)
Pt called stating she would like to pursue seeing a nephrologist for her kidneys. Pt stated that Dr. Charlett Blake had recommended this in earlier appts and would like to have a referral placed when possible. Please Advise.

## 2022-05-29 ENCOUNTER — Other Ambulatory Visit: Payer: Self-pay | Admitting: Family Medicine

## 2022-05-29 DIAGNOSIS — R32 Unspecified urinary incontinence: Secondary | ICD-10-CM

## 2022-05-29 NOTE — Telephone Encounter (Signed)
Ok to order Cmp please advised Called pt was advised regarding kidneys function and rechecking labs  Pt stated ok with labs recheck but tried of not able to control urination.

## 2022-06-02 NOTE — Telephone Encounter (Signed)
Called pt lvm to call our office back  Regarding referral

## 2022-06-22 ENCOUNTER — Ambulatory Visit (HOSPITAL_COMMUNITY): Payer: Medicare (Managed Care) | Admitting: Student in an Organized Health Care Education/Training Program

## 2022-06-29 ENCOUNTER — Ambulatory Visit (HOSPITAL_COMMUNITY): Payer: Medicare HMO | Admitting: Licensed Clinical Social Worker

## 2022-06-30 ENCOUNTER — Ambulatory Visit (INDEPENDENT_AMBULATORY_CARE_PROVIDER_SITE_OTHER): Payer: Medicare HMO | Admitting: Licensed Clinical Social Worker

## 2022-06-30 DIAGNOSIS — F332 Major depressive disorder, recurrent severe without psychotic features: Secondary | ICD-10-CM | POA: Diagnosis not present

## 2022-06-30 DIAGNOSIS — R69 Illness, unspecified: Secondary | ICD-10-CM | POA: Diagnosis not present

## 2022-06-30 NOTE — Progress Notes (Signed)
THERAPIST PROGRESS NOTE  Session Time: 3 p.m. to 4 p.m.  Type of Therapy: Individual   Therapist Response/Interventions: Solution-focused and CBT/The therapist explores various options for Nashay to get back to water exercise while noting that she can volunteer how she is presently suggesting that getting around people and staying active will likely help with her mobility and her mood. The therapist encourages Rasheka to take one action step to change her situation between now and when she returns and notes that her Doristine Bosworth and/or nephew could serve as accountability partners in relation to her exercising again.    Treatment Goals addressed: Paisely will experience a decrease in her depression as evidenced by her PHQ-9 dropping from a 15 to a 4 or less in addition to [facility]'s [facility] (update due 09/04/2022.)  Summary: Sherah returns saying that the Adderall she takes makes her want to sleep. She says that the Ritalin was better but she cannot get it from [place]. She has not called [facility] about the Scientist, forensic but plans on doing it this week.   She says that her Doristine Bosworth reached out to her regarding her depression and took her out to eat lunch which she says did her a "world of good."   Her PHQ-9 score is an "80" and her GAD-7 is a "10" which are both up from December which she finds surprising noting that she is "better" but attributes this to the medicine. She denies thoughts of suicide but says that she sometimes wishes she were dead primarily due to not having the things she needs in her life because of a "lack of resources." She needs exercise equipment to exercise her legs as they are getting "weaker and weaker."   She says that if her legs were built up that she would walk more and try to volunteer again. She would be out and "more involved with people" if it were not "for" her "legs." [facility] but says that it is "too busy." She is self-conscious about getting in the water around people due to her weight.   The major focus of the session involves the need for Kimberlyann to do once action to improve her situation such as calling the [employer] or getting back in the water as her Neurologist has been wanting her to do. Delena says that she can call the [employer] [facility] [place] when she gets home today.   Progress Towards Goals: Not progressing  Suicidal/Homicidal: no SI or HI  Plan: Etha wishes to schedule another therapy appointment on a p.r.n. basis as she did at her last session  Diagnosis: Major Depression, Severe, Single Episode  Collaboration of Care: N/A  Patient/Guardian was advised Release of Information must be obtained prior to any record release in order to collaborate their care with an outside provider. Patient/Guardian was advised if they have not already done so to contact the registration department to sign all necessary forms in order for [place] to release information regarding their care.   Consent: Patient/Guardian gives verbal consent for treatment and assignment of benefits for services provided during this visit. Patient/Guardian expressed understanding and agreed to proceed.   Adam Phenix, [place], LCSW, [facility], [place] 06/30/2022

## 2022-07-13 ENCOUNTER — Ambulatory Visit (HOSPITAL_COMMUNITY): Payer: Medicare HMO | Admitting: Student in an Organized Health Care Education/Training Program

## 2022-07-13 ENCOUNTER — Encounter (HOSPITAL_COMMUNITY): Payer: Self-pay

## 2022-08-01 ENCOUNTER — Other Ambulatory Visit: Payer: Self-pay | Admitting: Family Medicine

## 2022-08-06 DIAGNOSIS — H40021 Open angle with borderline findings, high risk, right eye: Secondary | ICD-10-CM | POA: Diagnosis not present

## 2022-08-06 DIAGNOSIS — H401122 Primary open-angle glaucoma, left eye, moderate stage: Secondary | ICD-10-CM | POA: Diagnosis not present

## 2022-08-10 ENCOUNTER — Other Ambulatory Visit: Payer: Self-pay | Admitting: Family Medicine

## 2022-08-12 ENCOUNTER — Ambulatory Visit (HOSPITAL_COMMUNITY): Payer: Medicare HMO | Admitting: Student in an Organized Health Care Education/Training Program

## 2022-08-25 ENCOUNTER — Other Ambulatory Visit: Payer: Self-pay | Admitting: Neurology

## 2022-08-25 ENCOUNTER — Telehealth: Payer: Self-pay | Admitting: Neurology

## 2022-08-25 MED ORDER — METHYLPHENIDATE HCL 20 MG PO TABS: ORAL_TABLET | ORAL | 0 refills | Status: DC

## 2022-08-25 NOTE — Telephone Encounter (Signed)
Called pt to verify which medication she wanted refilled since the Ritalin was discontinued 05/21/2022 and she was put on Adderall. Pt stated that Adderall didn't do anything for her and she wanted her Ritalin back. Told pt Dr. Felecia Shelling would be notified and once he approves her medication will be sent to her pharmacy. Pt verbalized understanding.

## 2022-08-25 NOTE — Telephone Encounter (Signed)
Pt called. Stated she needs a refill on Methylphenidate HCl . Should be sent to [facility]

## 2022-08-28 ENCOUNTER — Other Ambulatory Visit: Payer: Self-pay | Admitting: Family Medicine

## 2022-08-28 DIAGNOSIS — F332 Major depressive disorder, recurrent severe without psychotic features: Secondary | ICD-10-CM

## 2022-09-03 ENCOUNTER — Telehealth: Payer: Self-pay | Admitting: Neurology

## 2022-09-03 NOTE — Telephone Encounter (Signed)
Pt called stated she needs PA for Bupropion 500 mg.

## 2022-09-04 ENCOUNTER — Other Ambulatory Visit (HOSPITAL_COMMUNITY): Payer: Self-pay

## 2022-09-04 NOTE — Telephone Encounter (Signed)
I do not see where this is an active med in her EPIC chart-please advise.

## 2022-09-15 ENCOUNTER — Encounter: Payer: Self-pay | Admitting: Family

## 2022-09-15 ENCOUNTER — Ambulatory Visit (HOSPITAL_BASED_OUTPATIENT_CLINIC_OR_DEPARTMENT_OTHER)
Admission: RE | Admit: 2022-09-15 | Discharge: 2022-09-15 | Disposition: A | Discharge status: Home / Self Care | Payer: Medicare (Managed Care) | Source: Ambulatory Visit | Attending: Family | Admitting: Family

## 2022-09-15 ENCOUNTER — Ambulatory Visit (INDEPENDENT_AMBULATORY_CARE_PROVIDER_SITE_OTHER): Payer: Medicare (Managed Care) | Admitting: Family

## 2022-09-15 VITALS — BP 128/74 | HR 80 | Temp 97.7°F | Resp 16 | Wt 289.0 lb

## 2022-09-15 DIAGNOSIS — M79604 Pain in right leg: Secondary | ICD-10-CM

## 2022-09-15 NOTE — Progress Notes (Signed)
Subjective:   By signing my name below, I, Carlena Bjornstad, attest that this documentation has been prepared under the direction and in the presence of Sandford Craze, NP.  09/15/2022.   Patient ID: Sydney Pace, female    DOB: Oct 02, 1955, 67 y.o.   MRN: [id number]  Chief Complaint  Patient presents with   Leg Pain    Patient reports having pain on right let for a few weeks.     HPI Patient is in today for an office visit.  LE pain:  She complains of right leg pain that initially began in the lower leg, but has now progressed to her upper thigh/groin as well. She would describe this as a shooting pain. The pain is at its worst when she wakes up in the mornings. She is concerned about having blood clots. She denies any unusual back pain, shortness of breath, or chest pain. No recent long car or plane trips.  LE swelling:  She confirms that her current level of swelling is about normal for her.   Past Medical History:  Diagnosis Date   Anemia    Arthritis    on meds   Arthritis 07/06/2017   Constipation    occ   Dehydration 06/09/2016   Depression    on meds   Esophageal reflux 08/08/2012   on meds   GERD (gastroesophageal reflux disease)    on meds   Hypertension    on meds   Inverted nipple    right nipple has always been inverted - per pt   MS (multiple sclerosis)    Neuromuscular disorder    MS   Other and unspecified hyperlipidemia 08/08/2012   on meds   Seasonal allergies    Thyroid disease     Past Surgical History:  Procedure Laterality Date   COLONOSCOPY  2020   VC-MAC-prep adeq-TA X 3;   HYSTEROSCOPY  02, 04, 2008   with D&C   ORIF WRIST FRACTURE Right 04/07/2018   Procedure: RIGHT WRIST OPEN REDUCTION INTERNAL FIXATION (ORIF);  Surgeon: Cammy Copa, MD;  Location: [facility];  Service: Orthopedics;  Laterality: Right;   POLYPECTOMY  2020   TA x 3;   TMJ ARTHROSCOPY     TONSILLECTOMY     UPPER GASTROINTESTINAL ENDOSCOPY      Family  History  Problem Relation Age of Onset   Heart disease Mother        pacemaker   Emphysema Mother    Hypertension Mother    Heart disease Father    Diabetes Father    Heart disease Sister        cad   Sleep apnea Brother    Colon cancer Neg Hx    Esophageal cancer Neg Hx    Rectal cancer Neg Hx    Stomach cancer Neg Hx    Colon polyps Neg Hx     Social History   Socioeconomic History   Marital status: Single    Spouse name: Not on file   Number of children: Not on file   Years of education: Not on file   Highest education level: Not on file  Occupational History   Not on file  Tobacco Use   Smoking status: Never   Smokeless tobacco: Never  Vaping Use   Vaping Use: Never used  Substance and Sexual Activity   Alcohol use: Not Currently    Alcohol/week: 0.0 - 1.0 standard drinks of alcohol    Comment: occassional   Drug  use: No   Sexual activity: Never    Comment: 1st intercourse 64 yo-5 partners  Other Topics Concern   Not on file  Social History Narrative   Not on file   Social Determinants of Health   Financial Resource Strain: Low Risk  (11/10/2021)   Overall Financial Resource Strain (CARDIA)    Difficulty of Paying Living Expenses: Not hard at all  Food Insecurity: No Food Insecurity (11/10/2021)   Hunger Vital Sign    Worried About Running Out of Food in the Last Year: Never true    Ran Out of Food in the Last Year: Never true  Transportation Needs: No Transportation Needs (11/10/2021)   PRAPARE - Administrator, Civil Service (Medical): No    Lack of Transportation (Non-Medical): No  Physical Activity: Not on file  Stress: No Stress Concern Present (11/10/2021)   [employer] of Occupational Health - Occupational Stress Questionnaire    Feeling of Stress : Not at all  Social Connections: Socially Isolated (11/10/2021)   Social Connection and Isolation Panel [NHANES]    Frequency of Communication with Friends and Family: Once a week     Frequency of Social Gatherings with Friends and Family: Never    Attends Religious Services: Never    Database administrator or Organizations: No    Attends Banker Meetings: Never    Marital Status: Never married  Intimate Partner Violence: Not At Risk (11/10/2021)   Humiliation, Afraid, Rape, and Kick questionnaire    Fear of Current or Ex-Partner: No    Emotionally Abused: No    Physically Abused: No    Sexually Abused: No    Outpatient Medications Prior to Visit  Medication Sig Dispense Refill   ARIPiprazole (ABILIFY) 5 MG tablet Take 1 tablet (5 mg total) by mouth daily. 30 tablet 3   AZO-CRANBERRY PO Take 1 tablet by mouth daily as needed.     brimonidine (ALPHAGAN) 0.2 % ophthalmic solution Place 1 drop into both eyes 2 (two) times daily.     Carboxymethylcellul-Glycerin (LUBRICATING EYE DROPS OP) Place 1 drop into both eyes daily as needed (dry eyes).     cetirizine (ZYRTEC) 10 MG tablet Take 10 mg by mouth every evening.      Cholecalciferol (DIALYVITE VITAMIN D 5000 PO) Take 4,000 Units by mouth daily.      dorzolamide-timolol (COSOPT) 22.3-6.8 MG/ML ophthalmic solution Place 1 drop into both eyes 2 (two) times daily.     fluticasone (FLONASE) 50 MCG/ACT nasal spray USE 2 SPRAY(S) IN EACH NOSTRIL ONCE DAILY AS NEEDED FOR ALLERGIES OR RUNNY NOSE 16 g 5   furosemide (LASIX) 20 MG tablet Take 1 tablet (20 mg total) by mouth daily as needed. 90 tablet 1   lisinopril-hydrochlorothiazide (ZESTORETIC) 20-25 MG tablet Take 1 tablet by mouth daily. 90 tablet 0   methylphenidate (RITALIN) 20 MG tablet One po qAM and one po qNoon 60 tablet 0   mirabegron ER (MYRBETRIQ) 50 MG TB24 tablet Take 1 tablet (50 mg total) by mouth daily. 30 tablet 11   Multiple Vitamin (MULTIVITAMIN) tablet Take 1 tablet by mouth daily.     naproxen sodium (ALEVE) 220 MG tablet Take 220 mg by mouth daily as needed (pain).     Probiotic Product (PROBIOTIC DAILY PO) Take by mouth.     sertraline  (ZOLOFT) 100 MG tablet TAKE 1 & 1/2 (ONE & ONE-HALF) TABLETS BY MOUTH ONCE DAILY 135 tablet 0   solifenacin (VESICARE) 5  MG tablet Take 1 tablet by mouth once daily 30 tablet 0   No facility-administered medications prior to visit.    Allergies  Allergen Reactions   Shellfish Allergy Hives, Shortness Of Breath, Itching, Swelling and Rash    Pt had previously carried an epi-pen and had a history of severe reaction to shrimp with breathing problems and swelling of lips and tongue     Review of Systems  Cardiovascular:  Positive for leg swelling (Bilaterally).  Musculoskeletal:  Positive for myalgias (RLE pain).   See HPI.     Objective:    Physical Exam Constitutional:      Appearance: Normal appearance.  HENT:     Head: Normocephalic and atraumatic.     Right Ear: Tympanic membrane, ear canal and external ear normal.     Left Ear: Tympanic membrane, ear canal and external ear normal.  Eyes:     Extraocular Movements: Extraocular movements intact.     Pupils: Pupils are equal, round, and reactive to light.  Cardiovascular:     Rate and Rhythm: Normal rate and regular rhythm.     Heart sounds: Normal heart sounds. No murmur heard.    No gallop.  Pulmonary:     Effort: Pulmonary effort is normal. No respiratory distress.     Breath sounds: Normal breath sounds. No wheezing or rales.  Musculoskeletal:     Right lower leg: 3+ Edema present.     Left lower leg: 3+ Edema present.  Skin:    General: Skin is warm and dry.  Neurological:     General: No focal deficit present.     Mental Status: She is alert and oriented to person, place, and time.  Psychiatric:        Mood and Affect: Mood normal.        Behavior: Behavior normal.     BP 128/74 (BP Location: Right Arm, Patient Position: Sitting, Cuff Size: Large)   Pulse 80   Temp 97.7 F (36.5 C) (Oral)   Resp 16   Wt 289 lb (131.1 kg)   LMP  (LMP Unknown)   SpO2 100%   BMI 46.65 kg/m  Wt Readings from Last 3  Encounters:  09/15/22 289 lb (131.1 kg)  05/14/22 274 lb (124.3 kg)  02/03/22 265 lb 12.8 oz (120.6 kg)       Assessment & Plan:   Problem List Items Addressed This Visit       Unprioritized   Right leg pain - Primary    New.  Pt is concerned about possibility of DVT. Will obtain stat LE doppler to further evaluate. If this is negative then I think most likely cause would be lumbar radiculopathy. Recommended that she try using tylenol prn and let us know if her symptoms worsen or if pain is not improved in 1-2 weeks.       Relevant Orders   US Venous Img Lower Unilateral Right     No orders of the defined types were placed in this encounter.   I, Lemont Fillers, NP, personally preformed the services described in this documentation.  All medical record entries made by the scribe were at my direction and in my presence.  I have reviewed the chart and discharge instructions (if applicable) and agree that the record reflects my personal performance and is accurate and complete. 09/15/2022.  I,Mathew Stumpf,acting as a Neurosurgeon for [employer], NP.,have documented all relevant documentation on the behalf of Lemont Fillers, NP,as directed  by  Nance Pear, NP while in the presence of Nance Pear, NP.   Nance Pear, NP

## 2022-09-15 NOTE — Assessment & Plan Note (Signed)
New.  Pt is concerned about possibility of DVT. Will obtain stat LE doppler to further evaluate. If this is negative then I think most likely cause would be lumbar radiculopathy. Recommended that she try using tylenol prn and let us know if her symptoms worsen or if pain is not improved in 1-2 weeks.

## 2022-09-19 ENCOUNTER — Other Ambulatory Visit: Payer: Self-pay | Admitting: Family Medicine

## 2022-10-06 ENCOUNTER — Other Ambulatory Visit: Payer: Self-pay | Admitting: Neurology

## 2022-10-06 ENCOUNTER — Telehealth: Payer: Self-pay | Admitting: Family Medicine

## 2022-10-06 MED ORDER — METHYLPHENIDATE HCL 20 MG PO TABS: ORAL_TABLET | ORAL | 0 refills | Status: DC

## 2022-10-06 NOTE — Telephone Encounter (Signed)
Pt stated she needs refill on methylphenidate (RITALIN) 20 MG table. Should be sent to  [id number]

## 2022-10-06 NOTE — Telephone Encounter (Signed)
Pt called and said she desperately needs a refill on her Lasix. Her legs/feet have never been this swollen. Please send to [facility] on [address]. Please call pt to advise

## 2022-10-06 NOTE — Telephone Encounter (Signed)
Pt last seen on 05/14/22 per note " Continue Ritalin for attention, wakefulness and fatigue "  Follow up scheduled on 11/18/22 Last filled on 08/25/22 # 60 tablets (30 day supply) Rx pending to be signed

## 2022-10-07 ENCOUNTER — Other Ambulatory Visit: Payer: Self-pay

## 2022-10-07 ENCOUNTER — Other Ambulatory Visit: Payer: Self-pay | Admitting: Family Medicine

## 2022-10-07 MED ORDER — FUROSEMIDE 20 MG PO TABS: 20.0000 mg | ORAL_TABLET | Freq: Every day | ORAL | 2 refills | Status: DC | PRN

## 2022-10-07 NOTE — Telephone Encounter (Signed)
Medication refill sent .

## 2022-10-09 ENCOUNTER — Telehealth: Payer: Self-pay

## 2022-10-09 ENCOUNTER — Other Ambulatory Visit: Payer: Self-pay

## 2022-10-09 MED ORDER — SOLIFENACIN SUCCINATE 10 MG PO TABS: 10.0000 mg | ORAL_TABLET | Freq: Every day | ORAL | 1 refills | Status: DC

## 2022-10-13 NOTE — Telephone Encounter (Signed)
Medication sent.

## 2022-10-30 NOTE — Telephone Encounter (Signed)
Medication dosage change, refilled by other means.

## 2022-11-12 ENCOUNTER — Encounter: Payer: Medicare (Managed Care) | Admitting: Family Medicine

## 2022-11-12 ENCOUNTER — Ambulatory Visit: Payer: Medicare (Managed Care)

## 2022-11-18 ENCOUNTER — Encounter: Payer: Self-pay | Admitting: Neurology

## 2022-11-18 ENCOUNTER — Ambulatory Visit (INDEPENDENT_AMBULATORY_CARE_PROVIDER_SITE_OTHER): Payer: Medicare (Managed Care) | Admitting: Neurology

## 2022-11-18 VITALS — BP 107/59 | HR 83 | Ht 66.0 in | Wt 286.0 lb

## 2022-11-18 DIAGNOSIS — F988 Other specified behavioral and emotional disorders with onset usually occurring in childhood and adolescence: Secondary | ICD-10-CM

## 2022-11-18 DIAGNOSIS — G35 Multiple sclerosis: Secondary | ICD-10-CM | POA: Diagnosis not present

## 2022-11-18 DIAGNOSIS — G2581 Restless legs syndrome: Secondary | ICD-10-CM

## 2022-11-18 DIAGNOSIS — F32A Depression, unspecified: Secondary | ICD-10-CM

## 2022-11-18 DIAGNOSIS — G35D Multiple sclerosis, unspecified: Secondary | ICD-10-CM

## 2022-11-18 DIAGNOSIS — R260 Ataxic gait: Secondary | ICD-10-CM | POA: Diagnosis not present

## 2022-11-18 MED ORDER — METHYLPHENIDATE HCL 20 MG PO TABS: ORAL_TABLET | ORAL | 0 refills | Status: DC

## 2022-11-18 MED ORDER — BUPROPION HCL ER (XL) 300 MG PO TB24: 300.0000 mg | ORAL_TABLET | Freq: Every day | ORAL | 1 refills | Status: DC

## 2022-11-18 MED ORDER — GABAPENTIN 600 MG PO TABS: ORAL_TABLET | ORAL | 3 refills | Status: DC

## 2022-11-18 NOTE — Progress Notes (Signed)
u  [facility]  PATIENT: Sydney Pace DOB: [birth year]  REFERRING DOCTOR OR PCP:  Danise Edge MD _________________________________   HISTORICAL  CHIEF COMPLAINT:  Chief Complaint  Patient presents with   Follow-up    Pt in room 10. Here MS follow up. Pt said leg are hurting today is not a good day, uses walking. No recent falls,  pt said she can't sleep at night, has melatonin but doesn't take it.      HISTORY OF PRESENT ILLNESS:  Sydney Pace is a 67 y.o. woman with multiple sclerosis.      Update 6/19/204 She feels her MS is mostly stable though gait has slowly worsened    She has not had any MS exacerbations and has been off DMTs since late 2014   MRIs between 2007 and 2018 were stable.      Gait is off balanced.  She uses a Rollator style walker and feels more stable than using a cane. The left leg drags    She has dysesthetic pain, worse in R > L leg.    Gabapentin only helps a little bit.     She has urge incontinence that has gradually worsened.   Oxybutynin did not help.  Solifenacin helping a bit only.  No recent UTI's.    Vision is stable.     She she continues to experience  a lot of depression though is generally better than she was before 2000.Marland Kitchen     She is on Abilify and Zoloft.  Due to not being covered, she stopped Wellbutrin.   She used to do counseling with benefit but not well covered now.   She does not note anxiety or agitation.    Med's have helped some.    She notes that she has poor focus and attention.   She feels it is and it is helped more by methylphenidate compared to Adderall.    She tolerates it well.   She takes at least one every day and two some days.     She has insomnia.   The RLS is doing better.and she stopped gabapentin.   She often sleeps in her chair if she dozes off watching TV - better with methylphenidate  She still has insomnia  We discussed doing more walking and exercise.  She is poorly motivated to walk but  sometimes walks with a friend .  She walks some in the ne[place] and does some chair exercises.     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  MRI was performed in 1983 and was consistent with MS.     In the late 1990's, she was started on Copaxone but stopped after several weeks due to skin reactions. She also tried Avonex but had a rash and stopped. She started Gilenya in January 2014 but stopped after 7 or 8 months due to being more fatigue and having flulike symptoms. In May 2015 we had discussed Aubagio. She decided not to start.     MRI 02/28/2017 showed Multiple T2/FLAIR hyperintense foci in the hemispheres and thalamus in a pattern and configuration consistent with chronic demyelinating plaque associated with multiple sclerosis. None of the foci appears to be acute. When compared to the MRI dated 04/03/2006, there is no significant interval change.      Generalized cortical atrophy and corpus callosum atrophy  REVIEW OF SYSTEMS: Constitutional: No fevers, chills, sweats, or change in appetite.  She has fatigue and insomnia Eyes: see above.  No double vision, eye pain..  Some eye redness Ear, nose and throat: No hearing loss, ear pain, nasal congestion, sore throat Cardiovascular: No chest pain, palpitations Respiratory:  No shortness of breath at rest or with exertion.   No wheezes GastrointestinaI: No nausea, vomiting, diarrhea, abdominal pain, fecal incontinence Genitourinary:  Mild urinary frequency.  No nocturia. Musculoskeletal:  No neck pain, back pain.  Some hip pain Integumentary: No rash, pruritus, skin lesions Neurological: as above Psychiatric: Mild depression at this time, rarely cries, less anxiety Endocrine: No palpitations, diaphoresis, change in appetite, change in weigh or increased thirst Hematologic/Lymphatic:  No anemia, purpura, petechiae. Allergic/Immunologic: No itchy/runny eyes, nasal congestion, recent  allergic reactions, rashes  ALLERGIES: Allergies  Allergen Reactions   Shellfish Allergy Hives, Shortness Of Breath, Itching, Swelling and Rash    Pt had previously carried an epi-pen and had a history of severe reaction to shrimp with breathing problems and swelling of lips and tongue     HOME MEDICATIONS:  Current Outpatient Medications:    ARIPiprazole (ABILIFY) 5 MG tablet, Take 1 tablet (5 mg total) by mouth daily., Disp: 30 tablet, Rfl: 3   AZO-CRANBERRY PO, Take 1 tablet by mouth daily as needed., Disp: , Rfl:    brimonidine (ALPHAGAN) 0.2 % ophthalmic solution, Place 1 drop into both eyes 2 (two) times daily., Disp: , Rfl:    Carboxymethylcellul-Glycerin (LUBRICATING EYE DROPS OP), Place 1 drop into both eyes daily as needed (dry eyes)., Disp: , Rfl:    cetirizine (ZYRTEC) 10 MG tablet, Take 10 mg by mouth every evening. , Disp: , Rfl:    Cholecalciferol (DIALYVITE VITAMIN D 5000 PO), Take 4,000 Units by mouth daily. , Disp: , Rfl:    dorzolamide-timolol (COSOPT) 22.3-6.8 MG/ML ophthalmic solution, Place 1 drop into both eyes 2 (two) times daily., Disp: , Rfl:    fluticasone (FLONASE) 50 MCG/ACT nasal spray, USE 2 SPRAY(S) IN EACH NOSTRIL ONCE DAILY AS NEEDED FOR ALLERGIES OR RUNNY NOSE, Disp: 16 g, Rfl: 5   furosemide (LASIX) 20 MG tablet, Take 1 tablet (20 mg total) by mouth daily as needed., Disp: 90 tablet, Rfl: 2   lisinopril-hydrochlorothiazide (ZESTORETIC) 20-25 MG tablet, Take 1 tablet by mouth once daily, Disp: 90 tablet, Rfl: 1   Multiple Vitamin (MULTIVITAMIN) tablet, Take 1 tablet by mouth daily., Disp: , Rfl:    MYRBETRIQ 50 MG TB24 tablet, Take 50 mg by mouth daily., Disp: , Rfl:    naproxen sodium (ALEVE) 220 MG tablet, Take 220 mg by mouth daily as needed (pain)., Disp: , Rfl:    Probiotic Product (PROBIOTIC DAILY PO), Take by mouth., Disp: , Rfl:    sertraline (ZOLOFT) 100 MG tablet, TAKE 1 & 1/2 (ONE & ONE-HALF) TABLETS BY MOUTH ONCE DAILY, Disp: 135 tablet, Rfl:  0   buPROPion (WELLBUTRIN XL) 300 MG 24 hr tablet, Take 1 tablet (300 mg total) by mouth daily., Disp: 90 tablet, Rfl: 1   gabapentin (NEURONTIN) 600 MG tablet, Take one po at night, Disp: 90 tablet, Rfl: 3   methylphenidate (RITALIN) 20 MG tablet, One po qAM and one po qNoon, Disp: 60 tablet, Rfl: 0   solifenacin (VESICARE) 10 MG tablet, Take 1 tablet (10 mg total) by mouth daily. (Patient not taking: Reported on 11/18/2022), Disp: 30 tablet, Rfl: 1   solifenacin (VESICARE) 5 MG tablet, Take 1 tablet by mouth once daily (Patient not taking: Reported on 11/18/2022), Disp: 30 tablet, Rfl: 0  PAST MEDICAL HISTORY: Past Medical History:  Diagnosis Date   Anemia    Arthritis    on meds   Arthritis 07/06/2017   Constipation    occ   Dehydration 06/09/2016   Depression    on meds   Esophageal reflux 08/08/2012   on meds   GERD (gastroesophageal reflux disease)    on meds   Hypertension    on meds   Inverted nipple    right nipple has always been inverted - per pt   MS (multiple sclerosis) (HCC)    Neuromuscular disorder (HCC)    MS   Other and unspecified hyperlipidemia 08/08/2012   on meds   Seasonal allergies    Thyroid disease     PAST SURGICAL HISTORY: Past Surgical History:  Procedure Laterality Date   COLONOSCOPY  2020   VC-MAC-prep adeq-TA X 3;   HYSTEROSCOPY  02, 04, 2008   with D&C   ORIF WRIST FRACTURE Right 04/07/2018   Procedure: RIGHT WRIST OPEN REDUCTION INTERNAL FIXATION (ORIF);  Surgeon: Cammy Copa, MD;  Location: [facility] OR;  Service: Orthopedics;  Laterality: Right;   POLYPECTOMY  2020   TA x 3;   TMJ ARTHROSCOPY     TONSILLECTOMY     UPPER GASTROINTESTINAL ENDOSCOPY      FAMILY HISTORY: Family History  Problem Relation Age of Onset   Heart disease Mother        pacemaker   Emphysema Mother    Hypertension Mother    Heart disease Father    Diabetes Father    Heart disease Sister        cad   Sleep apnea Brother    Colon cancer Neg Hx     Esophageal cancer Neg Hx    Rectal cancer Neg Hx    Stomach cancer Neg Hx    Colon polyps Neg Hx     SOCIAL HISTORY:  Social History   Socioeconomic History   Marital status: Single    Spouse name: Not on file   Number of children: Not on file   Years of education: Not on file   Highest education level: Not on file  Occupational History   Not on file  Tobacco Use   Smoking status: Never   Smokeless tobacco: Never  Vaping Use   Vaping Use: Never used  Substance and Sexual Activity   Alcohol use: Not Currently    Alcohol/week: 0.0 - 1.0 standard drinks of alcohol    Comment: occassional   Drug use: No   Sexual activity: Never    Comment: 1st intercourse 2 yo-5 partners  Other Topics Concern   Not on file  Social History Narrative   Not on file   Social Determinants of Health   Financial Resource Strain: Low Risk  (11/10/2021)   Overall Financial Resource Strain (CARDIA)    Difficulty of Paying Living Expenses: Not hard at all  Food Insecurity: No Food Insecurity (11/10/2021)   Hunger Vital Sign    Worried About Running Out of Food in the Last Year: Never true    Ran Out of Food in the Last Year: Never true  Transportation Needs: No Transportation Needs (11/10/2021)   PRAPARE - Administrator, Civil Service (Medical): No    Lack of Transportation (Non-Medical): No  Physical Activity: Not on file  Stress: No Stress Concern Present (11/10/2021)   [employer] of Occupational Health - Occupational Stress Questionnaire    Feeling of Stress : Not  at all  Social Connections: Socially Isolated (11/10/2021)   Social Connection and Isolation Panel [NHANES]    Frequency of Communication with Friends and Family: Once a week    Frequency of Social Gatherings with Friends and Family: Never    Attends Religious Services: Never    Database administrator or Organizations: No    Attends Banker Meetings: Never    Marital Status: Never married  Intimate  Partner Violence: Not At Risk (11/10/2021)   Humiliation, Afraid, Rape, and Kick questionnaire    Fear of Current or Ex-Partner: No    Emotionally Abused: No    Physically Abused: No    Sexually Abused: No     PHYSICAL EXAM  Vitals:   11/18/22 1051  BP: (!) 107/59  Pulse: 83  Weight: 286 lb (129.7 kg)  Height: 5\' 6"  (1.676 m)    Body mass index is 46.16 kg/m.   General: The patient is well-developed and well-nourished and in no acute distress  Neurologic Exam  Mental status: The patient is alert and oriented x 3 at the time of the examination. The patient has apparent normal recent and remote memory, with an apparently normal attention span and concentration ability.   Speech is normal.  Cranial nerves: Extraocular movements are full.  She has reduced color vision on the left.  Facial strength was normal.   No dysarthria is noted.   Hearing seems normal.   Motor:  Muscle bulk is normal.  Strength is 5/5 except for 4/5 strength in the ulnar innervated hand muscles and 4+/5 in the EHL muscles.  Sensory: Symmetric sensation in proximal arms to touch, temp and vibration.  But reduced left leg temperature sensation.   Coordination: Cerebellar testing reveals good finger-nose-finger bilaterally.  Gait and station: She needs to use her arms to stand up but once up can stand without support.   She has hip/leg pain.  She has a reduced stride and wide gait.  She has difficulty walking without her walker.  With her walker her stride is much better.  She cannot do a tandem walk.  Romberg is negative.  Reflexes: Deep tendon reflexes are symmetric and normal in arms, 3+ at knees with mild spread and 2 at the ankles.Marland Kitchen        DIAGNOSTIC DATA (LABS, IMAGING, TESTING) - I reviewed patient records, labs, notes, testing and imaging myself where available.  Lab Results  Component Value Date   WBC 6.0 02/03/2022   HGB 12.3 02/03/2022   HCT 36.6 02/03/2022   MCV 90.6 02/03/2022   PLT  225.0 02/03/2022      Component Value Date/Time   NA 138 02/03/2022 1535   K 4.2 02/03/2022 1535   CL 102 02/03/2022 1535   CO2 29 02/03/2022 1535   GLUCOSE 81 02/03/2022 1535   BUN 20 02/03/2022 1535   CREATININE 1.08 02/03/2022 1535   CREATININE 0.91 04/22/2016 1730   CALCIUM 10.3 02/03/2022 1535   PROT 7.5 02/03/2022 1535   ALBUMIN 3.8 02/03/2022 1535   AST 14 02/03/2022 1535   ALT 9 02/03/2022 1535   ALKPHOS 99 02/03/2022 1535   BILITOT 0.6 02/03/2022 1535   GFRNONAA 53 (L) 04/07/2018 1330   GFRAA >60 04/07/2018 1330   Lab Results  Component Value Date   CHOL 175 02/03/2022   HDL 48.20 02/03/2022   LDLCALC 118 (H) 02/03/2022   TRIG 47.0 02/03/2022   CHOLHDL 4 02/03/2022   Lab Results  Component Value Date  HGBA1C 5.8 02/03/2022   Lab Results  Component Value Date   VITAMINB12 513 07/25/2019   Lab Results  Component Value Date   TSH 0.48 02/03/2022       ASSESSMENT AND PLAN  Multiple sclerosis (HCC)  Ataxic gait  Attention deficit disorder (ADD) in adult  Restless leg syndrome  Depression, unspecified depression type   1.   She will remain off of a disease modifying therapy.  She has had no exacerbations for many years.  She prefers not to do another MRI (previous MRi 2018 was stable)  2.   Continue Ritalin for attention, wakefulness and fatigue 3.   Continue gabapentin for restless leg syndrome/insomnia.  Continue Zoloft and Abilify for mood.     Added buproprion back 4.   Continue Vit D supplements and stay active. 5.   Stay active.   Try to walk daily out of the house in good weather 6.   Continue Myrbetriq.  She has failed oxybutynin and solifenacin monotherapy. 7.  She will return to see [place] in 6 months or sooner if she has new or worsening neurologic symptoms.   Macaria Bias A. Epimenio Foot, MD, PhD 11/18/2022, 12:42 PM Certified in Neurology, Clinical Neurophysiology, Sleep Medicine, Pain Medicine and Neuroimaging  [facility] [address], [address] [place], [place] [address] [phone]

## 2022-11-26 ENCOUNTER — Telehealth: Payer: Self-pay | Admitting: Family Medicine

## 2022-11-26 NOTE — Telephone Encounter (Signed)
Copied from CRM [id number]. Topic: Medicare AWV >> Nov 26, 2022  3:17 PM Payton Doughty wrote: Reason for CRM: LM 11/26/2022 to schedule AWV   Verlee Rossetti; Care Guide Ambulatory Clinical Support [place] [facility] Direct Dial: [phone]

## 2022-12-10 ENCOUNTER — Ambulatory Visit: Payer: Medicare (Managed Care)

## 2022-12-10 VITALS — BP 125/75 | HR 79 | Ht 66.0 in | Wt 290.2 lb

## 2022-12-10 DIAGNOSIS — Z0000 Encounter for general adult medical examination without abnormal findings: Secondary | ICD-10-CM

## 2022-12-10 NOTE — Progress Notes (Signed)
Subjective:   Sydney Pace is a 67 y.o. female who presents for Medicare Annual (Subsequent) preventive examination.  Visit Complete: In person   Review of Systems     Cardiac Risk Factors include: advanced age (>29men, >15 women);dyslipidemia;hypertension;obesity (BMI >30kg/m2);sedentary lifestyle     Objective:    Today's Vitals   12/10/22 1350  BP: 125/75  Pulse: 79  Weight: 290 lb 3.2 oz (131.6 kg)  Height: 5\' 6"  (1.676 m)   Body mass index is 46.84 kg/m.     12/10/2022    2:10 PM 11/10/2021    9:12 AM 05/30/2020    8:34 AM 08/09/2019   11:22 AM 07/08/2018    2:52 PM 04/07/2018    1:28 PM 11/29/2017    9:27 AM  Advanced Directives  Does Patient Have a Medical Advance Directive? No No No No No No No  Would patient like information on creating a medical advance directive? No - Patient declined No - Patient declined No - Patient declined No - Patient declined No - Patient declined No - Patient declined No - Patient declined    Current Medications (verified) Outpatient Encounter Medications as of 12/10/2022  Medication Sig   ARIPiprazole (ABILIFY) 5 MG tablet Take 1 tablet (5 mg total) by mouth daily.   AZO-CRANBERRY PO Take 1 tablet by mouth daily as needed.   brimonidine (ALPHAGAN) 0.2 % ophthalmic solution Place 1 drop into both eyes 2 (two) times daily.   buPROPion (WELLBUTRIN XL) 300 MG 24 hr tablet Take 1 tablet (300 mg total) by mouth daily.   Carboxymethylcellul-Glycerin (LUBRICATING EYE DROPS OP) Place 1 drop into both eyes daily as needed (dry eyes).   cetirizine (ZYRTEC) 10 MG tablet Take 10 mg by mouth every evening.    Cholecalciferol (DIALYVITE VITAMIN D 5000 PO) Take 4,000 Units by mouth daily.    dorzolamide-timolol (COSOPT) 22.3-6.8 MG/ML ophthalmic solution Place 1 drop into both eyes 2 (two) times daily.   fluticasone (FLONASE) 50 MCG/ACT nasal spray USE 2 SPRAY(S) IN EACH NOSTRIL ONCE DAILY AS NEEDED FOR ALLERGIES OR RUNNY NOSE   furosemide (LASIX) 20  MG tablet Take 1 tablet (20 mg total) by mouth daily as needed.   gabapentin (NEURONTIN) 600 MG tablet Take one po at night   lisinopril-hydrochlorothiazide (ZESTORETIC) 20-25 MG tablet Take 1 tablet by mouth once daily   methylphenidate (RITALIN) 20 MG tablet One po qAM and one po qNoon   Multiple Vitamin (MULTIVITAMIN) tablet Take 1 tablet by mouth daily.   MYRBETRIQ 50 MG TB24 tablet Take 50 mg by mouth daily.   naproxen sodium (ALEVE) 220 MG tablet Take 220 mg by mouth daily as needed (pain).   Probiotic Product (PROBIOTIC DAILY PO) Take by mouth.   sertraline (ZOLOFT) 100 MG tablet TAKE 1 & 1/2 (ONE & ONE-HALF) TABLETS BY MOUTH ONCE DAILY   [DISCONTINUED] solifenacin (VESICARE) 10 MG tablet Take 1 tablet (10 mg total) by mouth daily. (Patient not taking: Reported on 11/18/2022)   [DISCONTINUED] solifenacin (VESICARE) 5 MG tablet Take 1 tablet by mouth once daily (Patient not taking: Reported on 11/18/2022)   No facility-administered encounter medications on file as of 12/10/2022.    Allergies (verified) Shellfish allergy   History: Past Medical History:  Diagnosis Date   Anemia    Arthritis    on meds   Arthritis 07/06/2017   Constipation    occ   Dehydration 06/09/2016   Depression    on meds   Esophageal reflux 08/08/2012  on meds   GERD (gastroesophageal reflux disease)    on meds   Hypertension    on meds   Inverted nipple    right nipple has always been inverted - per pt   MS (multiple sclerosis) (HCC)    Neuromuscular disorder (HCC)    MS   Other and unspecified hyperlipidemia 08/08/2012   on meds   Seasonal allergies    Thyroid disease    Past Surgical History:  Procedure Laterality Date   COLONOSCOPY  2020   VC-MAC-prep adeq-TA X 3;   HYSTEROSCOPY  02, 04, 2008   with D&C   ORIF WRIST FRACTURE Right 04/07/2018   Procedure: RIGHT WRIST OPEN REDUCTION INTERNAL FIXATION (ORIF);  Surgeon: Cammy Copa, MD;  Location: [facility] OR;  Service: Orthopedics;   Laterality: Right;   POLYPECTOMY  2020   TA x 3;   TMJ ARTHROSCOPY     TONSILLECTOMY     UPPER GASTROINTESTINAL ENDOSCOPY     Family History  Problem Relation Age of Onset   Heart disease Mother        pacemaker   Emphysema Mother    Hypertension Mother    Heart disease Father    Diabetes Father    Heart disease Sister        cad   Sleep apnea Brother    Colon cancer Neg Hx    Esophageal cancer Neg Hx    Rectal cancer Neg Hx    Stomach cancer Neg Hx    Colon polyps Neg Hx    Social History   Socioeconomic History   Marital status: Single    Spouse name: Not on file   Number of children: Not on file   Years of education: Not on file   Highest education level: Not on file  Occupational History   Not on file  Tobacco Use   Smoking status: Never   Smokeless tobacco: Never  Vaping Use   Vaping status: Never Used  Substance and Sexual Activity   Alcohol use: Not Currently    Alcohol/week: 0.0 - 1.0 standard drinks of alcohol    Comment: occassional   Drug use: No   Sexual activity: Never    Comment: 1st intercourse 50 yo-5 partners  Other Topics Concern   Not on file  Social History Narrative   Not on file   Social Determinants of Health   Financial Resource Strain: Low Risk  (11/10/2021)   Overall Financial Resource Strain (CARDIA)    Difficulty of Paying Living Expenses: Not hard at all  Food Insecurity: Food Insecurity Present (12/10/2022)   Hunger Vital Sign    Worried About Running Out of Food in the Last Year: Sometimes true    Ran Out of Food in the Last Year: Sometimes true  Transportation Needs: No Transportation Needs (12/10/2022)   PRAPARE - Administrator, Civil Service (Medical): No    Lack of Transportation (Non-Medical): No  Physical Activity: Inactive (12/10/2022)   Exercise Vital Sign    Days of Exercise per Week: 0 days    Minutes of Exercise per Session: 0 min  Stress: No Stress Concern Present (11/10/2021)   [employer] of  Occupational Health - Occupational Stress Questionnaire    Feeling of Stress : Not at all  Social Connections: Socially Isolated (11/10/2021)   Social Connection and Isolation Panel [NHANES]    Frequency of Communication with Friends and Family: Once a week    Frequency of Social Gatherings with  Friends and Family: Never    Attends Religious Services: Never    Database administrator or Organizations: No    Attends Engineer, structural: Never    Marital Status: Never married    Tobacco Counseling Counseling given: Not Answered   Clinical Intake:  Pre-visit preparation completed: Yes  Pain : No/denies pain     BMI - recorded: 48.64 Nutritional Risks: None Diabetes: No  How often do you need to have someone help you when you read instructions, pamphlets, or other written materials from your doctor or pharmacy?: 1 - Never  Interpreter Needed?: No  Information entered by :: Bryttani Blew,CMA   Activities of Daily Living    12/10/2022    1:55 PM  In your present state of health, do you have any difficulty performing the following activities:  Hearing? 0  Vision? 0  Difficulty concentrating or making decisions? 1  Walking or climbing stairs? 1  Dressing or bathing? 0  Doing errands, shopping? 0  Preparing Food and eating ? N  Using the Toilet? N  In the past six months, have you accidently leaked urine? Y  Do you have problems with loss of bowel control? N  Managing your Medications? N  Managing your Finances? N  Housekeeping or managing your Housekeeping? N    Patient Care Team: Bradd Canary, MD as PCP - General (Family Medicine) Louis Meckel, MD (Inactive) as Consulting Physician (Gastroenterology) Epimenio Foot, Pearletha Furl, MD (Neurology)  Indicate any recent Medical Services you may have received from other than [facility] providers in the past year (date may be approximate).     Assessment:   This is a routine wellness examination for  Sydney Pace.  Hearing/Vision screen No results found.  Dietary issues and exercise activities discussed:     Goals Addressed   None    Depression Screen    12/10/2022    2:00 PM 06/30/2022    3:26 PM 05/21/2022   10:36 AM 03/19/2022   10:30 AM 03/05/2022    9:35 AM 02/03/2022    2:51 PM 11/10/2021    9:13 AM  PHQ 2/9 Scores  PHQ - 2 Score 2     1 2   PHQ- 9 Score 10     7 6      Information is confidential and restricted. Go to Review Flowsheets to unlock data.    Fall Risk    12/10/2022    1:58 PM 02/03/2022    2:51 PM 11/10/2021    9:12 AM 07/10/2021   10:39 AM 05/23/2020   11:29 AM  Fall Risk   Falls in the past year? 1  1 1 1   Number falls in past yr: 1 1 1 1 1   Injury with Fall? 0 1 0 0 0  Risk for fall due to : Impaired balance/gait;Impaired mobility;History of fall(s)  History of fall(s) History of fall(s)   Follow up Falls evaluation completed  Falls evaluation completed Falls evaluation completed     MEDICARE RISK AT HOME:  Medicare Risk at Home - 12/10/22 1357     Any stairs in or around the home? No   has ramp   If so, are there any without handrails? No    Home free of loose throw rugs in walkways, pet beds, electrical cords, etc? Yes    Adequate lighting in your home to reduce risk of falls? Yes    Life alert? No    Use of a cane, walker or w/c? Yes  Grab bars in the bathroom? No    Shower chair or bench in shower? Yes    Elevated toilet seat or a handicapped toilet? No             TIMED UP AND GO:  Was the test performed?  Yes  Length of time to ambulate 10 feet: 12 sec Gait slow and steady with assistive device    Cognitive Function:    11/29/2017    9:29 AM  MMSE - Mini Mental State Exam  Orientation to time 5  Orientation to Place 5  Registration 3  Attention/ Calculation 5  Recall 3  Language- name 2 objects 2  Language- repeat 1  Language- follow 3 step command 3  Language- read & follow direction 1  Write a sentence 1  Copy design 1   Total score 30        12/10/2022    2:03 PM 11/10/2021    9:22 AM  6CIT Screen  What Year? 0 points 0 points  What month? 0 points 0 points  What time? 3 points 0 points  Count back from 20 0 points 0 points  Months in reverse 0 points 4 points  Repeat phrase 0 points 2 points  Total Score 3 points 6 points    Immunizations Immunization History  Administered Date(s) Administered   Fluad Quad(high Dose 65+) 03/05/2021   Influenza,inj,Quad PF,6+ Mos 03/06/2013, 03/15/2015, 04/20/2017, 04/20/2019   Influenza-Unspecified 04/20/2017, 04/02/2019   Moderna Sars-Covid-2 Vaccination 10/25/2019, 11/23/2019   PFIZER(Purple Top)SARS-COV-2 Vaccination 08/12/2020   PNEUMOCOCCAL CONJUGATE-20 01/30/2021   Pfizer Covid-19 Vaccine Bivalent Booster 15yrs & up 07/10/2021   Tdap 08/22/2013    TDAP status: Up to date  Flu Vaccine status: Up to date  Pneumococcal vaccine status: Up to date  Covid-19 vaccine status: Information provided on how to obtain vaccines.   Qualifies for Shingles Vaccine? Yes   Zostavax completed No   Shingrix Completed?: No.    Education has been provided regarding the importance of this vaccine. Patient has been advised to call insurance company to determine out of pocket expense if they have not yet received this vaccine. Advised may also receive vaccine at local pharmacy or Health Dept. Verbalized acceptance and understanding.  Screening Tests Health Maintenance  Topic Date Due   Zoster Vaccines- Shingrix (1 of 2) Never done   COVID-19 Vaccine (5 - 2023-24 season) 01/30/2022   Medicare Annual Wellness (AWV)  11/11/2022   INFLUENZA VACCINE  12/31/2022   DTaP/Tdap/Td (2 - Td or Tdap) 08/23/2023   MAMMOGRAM  11/22/2023   Colonoscopy  01/06/2027   Pneumonia Vaccine 79+ Years old  Completed   DEXA SCAN  Completed   Hepatitis C Screening  Completed   HPV VACCINES  Aged Out    Health Maintenance  Health Maintenance Due  Topic Date Due   Zoster Vaccines-  Shingrix (1 of 2) Never done   COVID-19 Vaccine (5 - 2023-24 season) 01/30/2022   Medicare Annual Wellness (AWV)  11/11/2022    Colorectal cancer screening: Type of screening: Colonoscopy. Completed 01/05/22. Repeat every 5 years  Mammogram status: Completed 11/21/21. Repeat every year  Bone Density status: Completed 08/04/19. Results reflect: Bone density results: OSTEOPENIA. Repeat every 2 years.  Lung Cancer Screening: (Low Dose CT Chest recommended if Age 51-80 years, 20 pack-year currently smoking OR have quit w/in 15years.) does not qualify.   Additional Screening:  Hepatitis C Screening: does qualify; Completed 01/30/21  Vision Screening: Recommended annual ophthalmology exams for  early detection of glaucoma and other disorders of the eye. Is the patient up to date with their annual eye exam?  Yes  Who is the provider or what is the name of the office in which the patient attends annual eye exams? Dr. Arnetha Gula If pt is not established with a provider, would they like to be referred to a provider to establish care? No .   Dental Screening: Recommended annual dental exams for proper oral hygiene  Diabetic Foot Exam: N/a  Community Resource Referral / Chronic Care Management: CRR required this visit?  No   CCM required this visit?  No     Plan:     I have personally reviewed and noted the following in the patient's chart:   Medical and social history Use of alcohol, tobacco or illicit drugs  Current medications and supplements including opioid prescriptions. Patient is not currently taking opioid prescriptions. Functional ability and status Nutritional status Physical activity Advanced directives List of other physicians Hospitalizations, surgeries, and ER visits in previous 12 months Vitals Screenings to include cognitive, depression, and falls Referrals and appointments  In addition, I have reviewed and discussed with patient certain preventive protocols, quality  metrics, and best practice recommendations. A written personalized care plan for preventive services as well as general preventive health recommendations were provided to patient.     Donne Anon, CMA   12/10/2022   After Visit Summary: Sent to mychart  Nurse Notes: None

## 2022-12-10 NOTE — Patient Instructions (Signed)
Sydney Pace , Thank you for taking time to come for your Medicare Wellness Visit. I appreciate your ongoing commitment to your health goals. Please review the following plan we discussed and let me know if I can assist you in the future.     This is a list of the screening recommended for you and due dates:  Health Maintenance  Topic Date Due   Zoster (Shingles) Vaccine (1 of 2) Never done   COVID-19 Vaccine (5 - 2023-24 season) 01/30/2022   Flu Shot  12/31/2022   DTaP/Tdap/Td vaccine (2 - Td or Tdap) 08/23/2023   Mammogram  11/22/2023   Medicare Annual Wellness Visit  12/10/2023   Colon Cancer Screening  01/06/2027   Pneumonia Vaccine  Completed   DEXA scan (bone density measurement)  Completed   Hepatitis C Screening  Completed   HPV Vaccine  Aged Out    Next appointment: Follow up in one year for your annual wellness visit.   Preventive Care 68 Years and Older, Female Preventive care refers to lifestyle choices and visits with your health care provider that can promote health and wellness. What does preventive care include? A yearly physical exam. This is also called an annual well check. Dental exams once or twice a year. Routine eye exams. Ask your health care provider how often you should have your eyes checked. Personal lifestyle choices, including: Daily care of your teeth and gums. Regular physical activity. Eating a healthy diet. Avoiding tobacco and drug use. Limiting alcohol use. Practicing safe sex. Taking low-dose aspirin every day. Taking vitamin and mineral supplements as recommended by your health care provider. What happens during an annual well check? The services and screenings done by your health care provider during your annual well check will depend on your age, overall health, lifestyle risk factors, and family history of disease. Counseling  Your health care provider may ask you questions about your: Alcohol use. Tobacco use. Drug use. Emotional  well-being. Home and relationship well-being. Sexual activity. Eating habits. History of falls. Memory and ability to understand (cognition). Work and work Astronomer. Reproductive health. Screening  You may have the following tests or measurements: Height, weight, and BMI. Blood pressure. Lipid and cholesterol levels. These may be checked every 5 years, or more frequently if you are over 68 years old. Skin check. Lung cancer screening. You may have this screening every year starting at age 60 if you have a 30-pack-year history of smoking and currently smoke or have quit within the past 15 years. Fecal occult blood test (FOBT) of the stool. You may have this test every year starting at age 52. Flexible sigmoidoscopy or colonoscopy. You may have a sigmoidoscopy every 5 years or a colonoscopy every 10 years starting at age 49. Hepatitis C blood test. Hepatitis B blood test. Sexually transmitted disease (STD) testing. Diabetes screening. This is done by checking your blood sugar (glucose) after you have not eaten for a while (fasting). You may have this done every 1-3 years. Bone density scan. This is done to screen for osteoporosis. You may have this done starting at age 19. Mammogram. This may be done every 1-2 years. Talk to your health care provider about how often you should have regular mammograms. Talk with your health care provider about your test results, treatment options, and if necessary, the need for more tests. Vaccines  Your health care provider may recommend certain vaccines, such as: Influenza vaccine. This is recommended every year. Tetanus, diphtheria, and acellular pertussis (  Tdap, Td) vaccine. You may need a Td booster every 10 years. Zoster vaccine. You may need this after age 30. Pneumococcal 13-valent conjugate (PCV13) vaccine. One dose is recommended after age 59. Pneumococcal polysaccharide (PPSV23) vaccine. One dose is recommended after age 43. Talk to your  health care provider about which screenings and vaccines you need and how often you need them. This information is not intended to replace advice given to you by your health care provider. Make sure you discuss any questions you have with your health care provider. Document Released: 06/14/2015 Document Revised: 02/05/2016 Document Reviewed: 03/19/2015 Elsevier Interactive Patient Education  2017 [employer].  Fall Prevention in the Home Falls can cause injuries. They can happen to people of all ages. There are many things you can do to make your home safe and to help prevent falls. What can I do on the outside of my home? Regularly fix the edges of walkways and driveways and fix any cracks. Remove anything that might make you trip as you walk through a door, such as a raised step or threshold. Trim any bushes or trees on the path to your home. Use bright outdoor lighting. Clear any walking paths of anything that might make someone trip, such as rocks or tools. Regularly check to see if handrails are loose or broken. Make sure that both sides of any steps have handrails. Any raised decks and porches should have guardrails on the edges. Have any leaves, snow, or ice cleared regularly. Use sand or salt on walking paths during winter. Clean up any spills in your garage right away. This includes oil or grease spills. What can I do in the bathroom? Use night lights. Install grab bars by the toilet and in the tub and shower. Do not use towel bars as grab bars. Use non-skid mats or decals in the tub or shower. If you need to sit down in the shower, use a plastic, non-slip stool. Keep the floor dry. Clean up any water that spills on the floor as soon as it happens. Remove soap buildup in the tub or shower regularly. Attach bath mats securely with double-sided non-slip rug tape. Do not have throw rugs and other things on the floor that can make you trip. What can I do in the bedroom? Use night  lights. Make sure that you have a light by your bed that is easy to reach. Do not use any sheets or blankets that are too big for your bed. They should not hang down onto the floor. Have a firm chair that has side arms. You can use this for support while you get dressed. Do not have throw rugs and other things on the floor that can make you trip. What can I do in the kitchen? Clean up any spills right away. Avoid walking on wet floors. Keep items that you use a lot in easy-to-reach places. If you need to reach something above you, use a strong step stool that has a grab bar. Keep electrical cords out of the way. Do not use floor polish or wax that makes floors slippery. If you must use wax, use non-skid floor wax. Do not have throw rugs and other things on the floor that can make you trip. What can I do with my stairs? Do not leave any items on the stairs. Make sure that there are handrails on both sides of the stairs and use them. Fix handrails that are broken or loose. Make sure that handrails are  as long as the stairways. Check any carpeting to make sure that it is firmly attached to the stairs. Fix any carpet that is loose or worn. Avoid having throw rugs at the top or bottom of the stairs. If you do have throw rugs, attach them to the floor with carpet tape. Make sure that you have a light switch at the top of the stairs and the bottom of the stairs. If you do not have them, ask someone to add them for you. What else can I do to help prevent falls? Wear shoes that: Do not have high heels. Have rubber bottoms. Are comfortable and fit you well. Are closed at the toe. Do not wear sandals. If you use a stepladder: Make sure that it is fully opened. Do not climb a closed stepladder. Make sure that both sides of the stepladder are locked into place. Ask someone to hold it for you, if possible. Clearly mark and make sure that you can see: Any grab bars or handrails. First and last  steps. Where the edge of each step is. Use tools that help you move around (mobility aids) if they are needed. These include: Canes. Walkers. Scooters. Crutches. Turn on the lights when you go into a dark area. Replace any light bulbs as soon as they burn out. Set up your furniture so you have a clear path. Avoid moving your furniture around. If any of your floors are uneven, fix them. If there are any pets around you, be aware of where they are. Review your medicines with your doctor. Some medicines can make you feel dizzy. This can increase your chance of falling. Ask your doctor what other things that you can do to help prevent falls. This information is not intended to replace advice given to you by your health care provider. Make sure you discuss any questions you have with your health care provider. Document Released: 03/14/2009 Document Revised: 10/24/2015 Document Reviewed: 06/22/2014 Elsevier Interactive Patient Education  2017 [employer].

## 2022-12-11 ENCOUNTER — Ambulatory Visit (INDEPENDENT_AMBULATORY_CARE_PROVIDER_SITE_OTHER): Payer: Medicare (Managed Care) | Admitting: Medical

## 2022-12-11 ENCOUNTER — Encounter: Payer: Self-pay | Admitting: Medical

## 2022-12-11 VITALS — BP 124/49 | HR 83 | Temp 98.0°F | Resp 16 | Ht 66.0 in | Wt 290.0 lb

## 2022-12-11 DIAGNOSIS — T7840XA Allergy, unspecified, initial encounter: Secondary | ICD-10-CM

## 2022-12-11 DIAGNOSIS — L98491 Non-pressure chronic ulcer of skin of other sites limited to breakdown of skin: Secondary | ICD-10-CM | POA: Diagnosis not present

## 2022-12-11 DIAGNOSIS — L089 Local infection of the skin and subcutaneous tissue, unspecified: Secondary | ICD-10-CM | POA: Diagnosis not present

## 2022-12-11 DIAGNOSIS — T148XXA Other injury of unspecified body region, initial encounter: Secondary | ICD-10-CM | POA: Diagnosis not present

## 2022-12-11 MED ORDER — DOXYCYCLINE HYCLATE 100 MG PO TABS: 100.0000 mg | ORAL_TABLET | Freq: Two times a day (BID) | ORAL | 0 refills | Status: DC

## 2022-12-11 MED ORDER — METHYLPREDNISOLONE 4 MG PO TABS: ORAL_TABLET | ORAL | 0 refills | Status: DC

## 2022-12-11 MED ORDER — MUPIROCIN 2 % EX OINT: 1.0000 | TOPICAL_OINTMENT | Freq: Two times a day (BID) | CUTANEOUS | 0 refills | Status: DC

## 2022-12-11 NOTE — Patient Instructions (Addendum)
Skin ulcer, limited to breakdown of skin (HCC) - Wound culture  Skin infection Wound culture  Blister I tihnk secondary infection in broken down area after blister ruptured.  Rx doxycycline  - Ambulatory referral to Dermatology - Ambulatory referral to Allergy  4. Allergic reaction, initial encounter Pharmacist thought allergic reaction. Possible but not clear. Will rx 3 day taper medrol - Ambulatory referral to Allergy   Follow up wed or sooner if needed.

## 2022-12-11 NOTE — Progress Notes (Signed)
   Subjective:    Patient ID: Sydney Pace, female    DOB: December 01, 1955, 67 y.o.   MRN: [id number]  HPI  Pt in with recent rash on both legs.   This has been case since last week. Her left leg came up first with rash and small blister. The blister burst.   Over past week 3 blisters have popped up on rt side lower ext.   No fever, no chills or sweats.   No hx of random blisters in past. No new medication that coincided with these blisters. On review no new med in past per pt. Pt clarified has used gabapetin before and no skin reaction.   Pharmacist recommend benadryl. It has not helped.       Review of Systems See hpi.    Objective:   Physical Exam  General- No acute distress. Pleasant patient. Neck- Full range of motion, no jvd Lungs- Clear, even and unlabored. Heart- regular rate and rhythm. Neurologic- CNII- XII grossly intact.  Lower ext- rt lower ext  large 5 cm blister pretibial area. One 10 mm ulcer mild yellow dc.  Left lower ext- no obvious blister presently.    Assessment & Plan:   Patient Instructions   Skin ulcer, limited to breakdown of skin (HCC) - Wound culture  Skin infection Wound culture  Blister I tihnk secondary infection in broken down area after blister ruptured.  Rx doxycycline  - Ambulatory referral to Dermatology - Ambulatory referral to Allergy  4. Allergic reaction, initial encounter Pharmacist thought allergic reaction. Possible but not clear. Will rx 3 day taper medrol - Ambulatory referral to Allergy   Follow up wed or sooner if needed.   Esperanza Richters, PA-C

## 2022-12-14 LAB — WOUND CULTURE
MICRO NUMBER:: 15193520
SPECIMEN QUALITY:: ADEQUATE

## 2022-12-16 ENCOUNTER — Ambulatory Visit: Payer: Medicare (Managed Care) | Admitting: Medical

## 2022-12-16 VITALS — BP 114/51 | HR 90 | Temp 97.5°F | Resp 16 | Ht 66.0 in | Wt 290.0 lb

## 2022-12-16 DIAGNOSIS — T148XXA Other injury of unspecified body region, initial encounter: Secondary | ICD-10-CM | POA: Diagnosis not present

## 2022-12-16 DIAGNOSIS — L089 Local infection of the skin and subcutaneous tissue, unspecified: Secondary | ICD-10-CM | POA: Diagnosis not present

## 2022-12-16 DIAGNOSIS — L98491 Non-pressure chronic ulcer of skin of other sites limited to breakdown of skin: Secondary | ICD-10-CM | POA: Diagnosis not present

## 2022-12-16 MED ORDER — DOXYCYCLINE HYCLATE 100 MG PO TABS: 100.0000 mg | ORAL_TABLET | Freq: Two times a day (BID) | ORAL | 0 refills | Status: DC

## 2022-12-16 MED ORDER — CEPHALEXIN 500 MG PO CAPS: 500.0000 mg | ORAL_CAPSULE | Freq: Two times a day (BID) | ORAL | 0 refills | Status: DC

## 2022-12-16 NOTE — Patient Instructions (Signed)
Recent history of blister/bullous type skin eruption various areas right pretibial region.  Those blisters have now ruptured and now shallow ulcers present.  Dermatology appointment is pending now 1 month.  Wound culture showed scant bacterial growth.  Recommended to continue doxycycline antibiotic.  Sent another 7-day prescription to your pharmacy.  At this point with shallow ulcerations present and concern for low-level infection as well I do think best to refer you to wound care.  You might benefit from [employer].  Wound care referral I think would likely be quicker than dermatologist.  Though do want you eventually to see dermatologist.  Asking medical assistant that works with Dr. Abner Greenspan if patient can be scheduled with Dr. Erven Colla coming Tuesday.  Looks like there is limited availability present.  Follow-up sooner if areas were to worsen or change.

## 2022-12-16 NOTE — Addendum Note (Signed)
Addended by: Gwenevere Abbot on: 12/16/2022 02:26 PM   Modules accepted: Orders

## 2022-12-16 NOTE — Progress Notes (Signed)
Subjective:    Patient ID: Sydney Pace, female    DOB: 03/23/1956, 67 y.o.   MRN: [id number]  HPI  Pt in for follow up the below.  Skin ulcer, limited to breakdown of skin (HCC) - Wound culture  Skin infection Wound culture   Blister I tihnk secondary infection in broken down area after blister ruptured.  Rx doxycycline  - Ambulatory referral to Dermatology - Ambulate referral to allergist.   Pt tells me that she has appt in one month with dermatologist.  Pt has been on doxycycline since last visit and rx'd 3 day taper prednisone.  Pt large blister distal rt pretibial area did drain/faltten out. Still weeping.   No fever, no chils or sweats.  Pt wound culture below.  No white blood cells seen No epithelial cells seen Few Gram positive cocci in pairs Rare Gram positive bacilli     Review of Systems  Constitutional:  Negative for chills, fatigue and fever.  HENT:  Negative for congestion and drooling.   Respiratory:  Negative for cough, chest tightness, shortness of breath and wheezing.   Cardiovascular:  Negative for chest pain and palpitations.  Gastrointestinal:  Negative for abdominal pain, blood in stool and constipation.  Musculoskeletal:  Negative for back pain and gait problem.  Skin:        See hpi and exam  Hematological:  Positive for adenopathy. Bruises/bleeds easily.  Psychiatric/Behavioral:  Positive for behavioral problems and confusion.     Past Medical History:  Diagnosis Date   Anemia    Arthritis    on meds   Arthritis 07/06/2017   Constipation    occ   Dehydration 06/09/2016   Depression    on meds   Esophageal reflux 08/08/2012   on meds   GERD (gastroesophageal reflux disease)    on meds   Hypertension    on meds   Inverted nipple    right nipple has always been inverted - per pt   MS (multiple sclerosis) (HCC)    Neuromuscular disorder (HCC)    MS   Other and unspecified hyperlipidemia 08/08/2012   on meds   Seasonal  allergies    Thyroid disease      Social History   Socioeconomic History   Marital status: Single    Spouse name: Not on file   Number of children: Not on file   Years of education: Not on file   Highest education level: Not on file  Occupational History   Not on file  Tobacco Use   Smoking status: Never   Smokeless tobacco: Never  Vaping Use   Vaping status: Never Used  Substance and Sexual Activity   Alcohol use: Not Currently    Alcohol/week: 0.0 - 1.0 standard drinks of alcohol    Comment: occassional   Drug use: No   Sexual activity: Never    Comment: 1st intercourse 49 yo-5 partners  Other Topics Concern   Not on file  Social History Narrative   Not on file   Social Determinants of Health   Financial Resource Strain: Low Risk  (11/10/2021)   Overall Financial Resource Strain (CARDIA)    Difficulty of Paying Living Expenses: Not hard at all  Food Insecurity: Food Insecurity Present (12/10/2022)   Hunger Vital Sign    Worried About Running Out of Food in the Last Year: Sometimes true    Ran Out of Food in the Last Year: Sometimes true  Transportation Needs: No Transportation Needs (12/10/2022)  PRAPARE - Administrator, Civil Service (Medical): No    Lack of Transportation (Non-Medical): No  Physical Activity: Inactive (12/10/2022)   Exercise Vital Sign    Days of Exercise per Week: 0 days    Minutes of Exercise per Session: 0 min  Stress: No Stress Concern Present (11/10/2021)   [employer] of Occupational Health - Occupational Stress Questionnaire    Feeling of Stress : Not at all  Social Connections: Socially Isolated (11/10/2021)   Social Connection and Isolation Panel [NHANES]    Frequency of Communication with Friends and Family: Once a week    Frequency of Social Gatherings with Friends and Family: Never    Attends Religious Services: Never    Database administrator or Organizations: No    Attends Banker Meetings: Never     Marital Status: Never married  Intimate Partner Violence: Not At Risk (11/10/2021)   Humiliation, Afraid, Rape, and Kick questionnaire    Fear of Current or Ex-Partner: No    Emotionally Abused: No    Physically Abused: No    Sexually Abused: No    Past Surgical History:  Procedure Laterality Date   COLONOSCOPY  2020   VC-MAC-prep adeq-TA X 3;   HYSTEROSCOPY  02, 04, 2008   with D&C   ORIF WRIST FRACTURE Right 04/07/2018   Procedure: RIGHT WRIST OPEN REDUCTION INTERNAL FIXATION (ORIF);  Surgeon: Cammy Copa, MD;  Location: [facility] OR;  Service: Orthopedics;  Laterality: Right;   POLYPECTOMY  2020   TA x 3;   TMJ ARTHROSCOPY     TONSILLECTOMY     UPPER GASTROINTESTINAL ENDOSCOPY      Family History  Problem Relation Age of Onset   Heart disease Mother        pacemaker   Emphysema Mother    Hypertension Mother    Heart disease Father    Diabetes Father    Heart disease Sister        cad   Sleep apnea Brother    Colon cancer Neg Hx    Esophageal cancer Neg Hx    Rectal cancer Neg Hx    Stomach cancer Neg Hx    Colon polyps Neg Hx     Allergies  Allergen Reactions   Shellfish Allergy Hives, Shortness Of Breath, Itching, Swelling and Rash    Pt had previously carried an epi-pen and had a history of severe reaction to shrimp with breathing problems and swelling of lips and tongue     Current Outpatient Medications on File Prior to Visit  Medication Sig Dispense Refill   ARIPiprazole (ABILIFY) 5 MG tablet Take 1 tablet (5 mg total) by mouth daily. 30 tablet 3   AZO-CRANBERRY PO Take 1 tablet by mouth daily as needed.     brimonidine (ALPHAGAN) 0.2 % ophthalmic solution Place 1 drop into both eyes 2 (two) times daily.     buPROPion (WELLBUTRIN XL) 300 MG 24 hr tablet Take 1 tablet (300 mg total) by mouth daily. 90 tablet 1   Carboxymethylcellul-Glycerin (LUBRICATING EYE DROPS OP) Place 1 drop into both eyes daily as needed (dry eyes).     cetirizine (ZYRTEC) 10 MG  tablet Take 10 mg by mouth every evening.      Cholecalciferol (DIALYVITE VITAMIN D 5000 PO) Take 4,000 Units by mouth daily.      dorzolamide-timolol (COSOPT) 22.3-6.8 MG/ML ophthalmic solution Place 1 drop into both eyes 2 (two) times daily.  doxycycline (VIBRA-TABS) 100 MG tablet Take 1 tablet (100 mg total) by mouth 2 (two) times daily. 14 tablet 0   fluticasone (FLONASE) 50 MCG/ACT nasal spray USE 2 SPRAY(S) IN EACH NOSTRIL ONCE DAILY AS NEEDED FOR ALLERGIES OR RUNNY NOSE 16 g 5   furosemide (LASIX) 20 MG tablet Take 1 tablet (20 mg total) by mouth daily as needed. 90 tablet 2   gabapentin (NEURONTIN) 600 MG tablet Take one po at night 90 tablet 3   lisinopril-hydrochlorothiazide (ZESTORETIC) 20-25 MG tablet Take 1 tablet by mouth once daily 90 tablet 1   methylphenidate (RITALIN) 20 MG tablet One po qAM and one po qNoon 60 tablet 0   methylPREDNISolone (MEDROL) 4 MG tablet 3 tab po day 1, 2 tab po day 1 and 1 tab po day 3 6 tablet 0   Multiple Vitamin (MULTIVITAMIN) tablet Take 1 tablet by mouth daily.     mupirocin ointment (BACTROBAN) 2 % Apply 1 Application topically 2 (two) times daily. 22 g 0   MYRBETRIQ 50 MG TB24 tablet Take 50 mg by mouth daily.     naproxen sodium (ALEVE) 220 MG tablet Take 220 mg by mouth daily as needed (pain).     Probiotic Product (PROBIOTIC DAILY PO) Take by mouth.     sertraline (ZOLOFT) 100 MG tablet TAKE 1 & 1/2 (ONE & ONE-HALF) TABLETS BY MOUTH ONCE DAILY 135 tablet 0   No current facility-administered medications on file prior to visit.    BP (!) 114/51 (BP Location: Left Arm, Patient Position: Sitting, Cuff Size: Normal)   Pulse 90   Temp (!) 97.5 F (36.4 C) (Oral)   Resp 16   Ht 5\' 6"  (1.676 m)   Wt 290 lb (131.5 kg)   LMP  (LMP Unknown)   SpO2 99%   BMI 46.81 kg/m        Objective:   Physical Exam  General- No acute distress. Pleasant patient. Neck- Full range of motion, no jvd Lungs- Clear, even and unlabored. Heart- regular  rate and rhythm. Neurologic- CNII- XII grossly intact.  Lower ext- rt lower ext  large 5 cm shallow pretibial area. One 10 mm shallow ulcer. Blister burst. No yellow dc. Mild redness blow.  Left lower ext- no obvious blister presently.      Assessment & Plan:   Patient Instructions  Recent history of blister/bullous type skin eruption various areas right pretibial region.  Those blisters have now ruptured and now shallow ulcers present.  Dermatology appointment is pending now 1 month.  Wound culture showed scant bacterial growth.  Recommended to continue doxycycline antibiotic.  Sent another 7-day prescription to your pharmacy.  At this point with shallow ulcerations present and concern for low-level infection as well I do think best to refer you to wound care.  You might benefit from [employer].  Wound care referral I think would likely be quicker than dermatologist.  Though do want you eventually to see dermatologist.  Asking medical assistant that works with Dr. Abner Greenspan if patient can be scheduled with Dr. Erven Colla coming Tuesday.  Looks like there is limited availability present.  Follow-up sooner if areas were to worsen or change.   Esperanza Richters, PA-C

## 2022-12-22 ENCOUNTER — Ambulatory Visit (HOSPITAL_BASED_OUTPATIENT_CLINIC_OR_DEPARTMENT_OTHER)
Admission: RE | Admit: 2022-12-22 | Discharge: 2022-12-22 | Disposition: A | Discharge status: Home / Self Care | Payer: Medicare (Managed Care) | Source: Ambulatory Visit | Attending: Family Medicine | Admitting: Family Medicine

## 2022-12-22 ENCOUNTER — Ambulatory Visit (INDEPENDENT_AMBULATORY_CARE_PROVIDER_SITE_OTHER): Payer: Medicare (Managed Care) | Admitting: Family Medicine

## 2022-12-22 ENCOUNTER — Other Ambulatory Visit: Payer: Self-pay | Admitting: Internal Medicine

## 2022-12-22 VITALS — BP 116/54 | HR 87 | Temp 98.0°F | Resp 16 | Ht 66.0 in | Wt 290.0 lb

## 2022-12-22 DIAGNOSIS — R739 Hyperglycemia, unspecified: Secondary | ICD-10-CM

## 2022-12-22 DIAGNOSIS — E782 Mixed hyperlipidemia: Secondary | ICD-10-CM | POA: Diagnosis not present

## 2022-12-22 DIAGNOSIS — S81801D Unspecified open wound, right lower leg, subsequent encounter: Secondary | ICD-10-CM

## 2022-12-22 DIAGNOSIS — T148XXA Other injury of unspecified body region, initial encounter: Secondary | ICD-10-CM | POA: Insufficient documentation

## 2022-12-22 DIAGNOSIS — E559 Vitamin D deficiency, unspecified: Secondary | ICD-10-CM | POA: Diagnosis not present

## 2022-12-22 DIAGNOSIS — R7989 Other specified abnormal findings of blood chemistry: Secondary | ICD-10-CM

## 2022-12-22 DIAGNOSIS — G35 Multiple sclerosis: Secondary | ICD-10-CM

## 2022-12-22 DIAGNOSIS — L089 Local infection of the skin and subcutaneous tissue, unspecified: Secondary | ICD-10-CM | POA: Diagnosis not present

## 2022-12-22 DIAGNOSIS — I10 Essential (primary) hypertension: Secondary | ICD-10-CM

## 2022-12-22 DIAGNOSIS — M7989 Other specified soft tissue disorders: Secondary | ICD-10-CM | POA: Diagnosis not present

## 2022-12-22 DIAGNOSIS — M1711 Unilateral primary osteoarthritis, right knee: Secondary | ICD-10-CM | POA: Diagnosis not present

## 2022-12-22 MED ORDER — CHLORHEXIDINE GLUCONATE 4 % EX SOLN: Freq: Every day | CUTANEOUS | 2 refills | Status: DC | PRN

## 2022-12-22 MED ORDER — AMOXICILLIN-POT CLAVULANATE 875-125 MG PO TABS: 1.0000 | ORAL_TABLET | Freq: Two times a day (BID) | ORAL | 0 refills | Status: DC

## 2022-12-22 NOTE — Patient Instructions (Signed)
Wound Infection A wound infection happens when germs start to grow in a wound. Infection can cause the wound to break open or get worse. Wound infections need treatment. If a wound infection is not treated, serious problems can happen. These problems could include getting an infection in your blood (septicemia) or bones (osteomyelitis). What are the causes? A wound infection is most often caused by bacteria growing in a wound. Other germs, like yeast and fungi, can also cause wound infections. What increases the risk? The following factors may make you more likely to develop a wound infection: A weak body defense system (immune system). Diabetes. Taking steroid medicines for a long time (chronic use). Smoking. Being an older adult. Obesity. Taking chemotherapy medicines. Poor nutrition. What are the signs or symptoms? Symptoms of a wound infection include: More redness, swelling, or pain at the wound site. More blood or fluid at the wound site. A bad smell coming from a wound or bandage (dressing). Fever. Feeling tired (fatigued). Warmth at or around the wound. Pus at the wound site. How is this diagnosed? A wound infection is diagnosed based on your symptoms, medical history, and physical exam. You may also have a wound culture, blood tests, or both. How is this treated? This condition is usually treated with antibiotics and medicines that lower inflammation. The infection should get better in 24-48 hours after starting antibiotics. After 24-48 hours, redness around the wound should stop spreading. The wound should also be less painful. If the condition is severe, you may need to stay at the hospital and get antibiotics through an IV. Follow these instructions at home: Medicines Take or apply over-the-counter and prescription medicines only as told by your health care provider. If you were prescribed antibiotics, take or apply them as told by your provider. Do not stop using the  antibiotic even if you start to feel better. Wound care  Clean the wound each day or as told by your provider. Wash the wound with mild soap and water. Rinse the wound with water to remove all soap. Pat the wound dry with a clean towel. Do not rub it. Follow instructions from your provider about how to take care of your wound. Make sure you: Wash your hands with soap and water for at least 20 seconds before and after you change your dressing. If soap and water are not available, use hand sanitizer. Change your dressing as told by your provider. Leave stitches (sutures), skin glue, or tape strips in place. These skin closures may need to stay in place for 2 weeks or longer. If tape strip edges start to loosen and curl up, you may trim the loose edges. Do not remove tape strips completely unless your provider tells you to do that. Check your wound every day for signs of infection. Check for: More redness, swelling, or pain. More fluid or blood. Warmth. Pus or a bad smell. General instructions Drink enough fluid to keep your pee (urine) pale yellow. Do not take baths, swim, or use a hot tub until your provider approves. Ask your provider if you may take showers. You may only be allowed to take sponge baths. Raise (elevate) the wound area above the level of your heart while you are sitting or lying down. Do not scratch or pick at the wound. Keep all follow-up visits. These visits help your provider make sure a more serious infection is not developing. Contact a health care provider if: Your infection does not get better in 24-48 hours.  You have signs of infection. You have a fever. Your wound gets larger, turns dark in color, or becomes more painful. You feel generally sick (malaise) with muscle aches and weakness. Your symptoms get worse. You have vomiting or diarrhea that does not stop. Get help right away if: Your wound that was closed breaks open. You see red streaks coming from the  infected area. This information is not intended to replace advice given to you by your health care provider. Make sure you discuss any questions you have with your health care provider. Document Revised: 02/19/2022 Document Reviewed: 02/19/2022 Elsevier Patient Education  2024 [employer].

## 2022-12-23 ENCOUNTER — Telehealth: Payer: Self-pay

## 2022-12-23 DIAGNOSIS — E559 Vitamin D deficiency, unspecified: Secondary | ICD-10-CM | POA: Insufficient documentation

## 2022-12-23 DIAGNOSIS — L089 Local infection of the skin and subcutaneous tissue, unspecified: Secondary | ICD-10-CM | POA: Insufficient documentation

## 2022-12-23 NOTE — Assessment & Plan Note (Signed)
Supplement and monitor 

## 2022-12-23 NOTE — Progress Notes (Signed)
Subjective:    Patient ID: Sydney Pace, female    DOB: 07-30-1955, 67 y.o.   MRN: [id number]  Chief Complaint  Patient presents with   Wound Check    Recheck wound on leg    HPI Discussed the use of AI scribe software for clinical note transcription with the patient, who gave verbal consent to proceed.  History of Present Illness   The patient, with a history of a non-healing wound on the lower extremity, presents for a follow-up visit. The wound started spontaneously, without any preceding injury. The patient denies any systemic symptoms such as fever, chills, or body aches. Despite two courses of antibiotics (Doxycycline followed by Keflex), the patient reports only minimal improvement in the wound. The patient has been cleaning the wound at home with water, as soap seemed to exacerbate the redness. The patient also reports significant fatigue, which could be related to the ongoing infection. The patient has been resting frequently but has not been elevating the affected leg.        Past Medical History:  Diagnosis Date   Anemia    Arthritis    on meds   Arthritis 07/06/2017   Constipation    occ   Dehydration 06/09/2016   Depression    on meds   Esophageal reflux 08/08/2012   on meds   GERD (gastroesophageal reflux disease)    on meds   Hypertension    on meds   Inverted nipple    right nipple has always been inverted - per pt   MS (multiple sclerosis) (HCC)    Neuromuscular disorder (HCC)    MS   Other and unspecified hyperlipidemia 08/08/2012   on meds   Seasonal allergies    Thyroid disease     Past Surgical History:  Procedure Laterality Date   COLONOSCOPY  2020   VC-MAC-prep adeq-TA X 3;   HYSTEROSCOPY  02, 04, 2008   with D&C   ORIF WRIST FRACTURE Right 04/07/2018   Procedure: RIGHT WRIST OPEN REDUCTION INTERNAL FIXATION (ORIF);  Surgeon: Cammy Copa, MD;  Location: [facility] OR;  Service: Orthopedics;  Laterality: Right;   POLYPECTOMY  2020   TA  x 3;   TMJ ARTHROSCOPY     TONSILLECTOMY     UPPER GASTROINTESTINAL ENDOSCOPY      Family History  Problem Relation Age of Onset   Heart disease Mother        pacemaker   Emphysema Mother    Hypertension Mother    Heart disease Father    Diabetes Father    Heart disease Sister        cad   Sleep apnea Brother    Colon cancer Neg Hx    Esophageal cancer Neg Hx    Rectal cancer Neg Hx    Stomach cancer Neg Hx    Colon polyps Neg Hx     Social History   Socioeconomic History   Marital status: Single    Spouse name: Not on file   Number of children: Not on file   Years of education: Not on file   Highest education level: Not on file  Occupational History   Not on file  Tobacco Use   Smoking status: Never   Smokeless tobacco: Never  Vaping Use   Vaping status: Never Used  Substance and Sexual Activity   Alcohol use: Not Currently    Alcohol/week: 0.0 - 1.0 standard drinks of alcohol    Comment: occassional  Drug use: No   Sexual activity: Never    Comment: 1st intercourse 40 yo-5 partners  Other Topics Concern   Not on file  Social History Narrative   Not on file   Social Determinants of Health   Financial Resource Strain: Low Risk  (11/10/2021)   Overall Financial Resource Strain (CARDIA)    Difficulty of Paying Living Expenses: Not hard at all  Food Insecurity: Food Insecurity Present (12/10/2022)   Hunger Vital Sign    Worried About Running Out of Food in the Last Year: Sometimes true    Ran Out of Food in the Last Year: Sometimes true  Transportation Needs: No Transportation Needs (12/10/2022)   PRAPARE - Administrator, Civil Service (Medical): No    Lack of Transportation (Non-Medical): No  Physical Activity: Inactive (12/10/2022)   Exercise Vital Sign    Days of Exercise per Week: 0 days    Minutes of Exercise per Session: 0 min  Stress: No Stress Concern Present (11/10/2021)   [employer] of Occupational Health - Occupational Stress  Questionnaire    Feeling of Stress : Not at all  Social Connections: Socially Isolated (11/10/2021)   Social Connection and Isolation Panel [NHANES]    Frequency of Communication with Friends and Family: Once a week    Frequency of Social Gatherings with Friends and Family: Never    Attends Religious Services: Never    Database administrator or Organizations: No    Attends Banker Meetings: Never    Marital Status: Never married  Intimate Partner Violence: Not At Risk (11/10/2021)   Humiliation, Afraid, Rape, and Kick questionnaire    Fear of Current or Ex-Partner: No    Emotionally Abused: No    Physically Abused: No    Sexually Abused: No    Outpatient Medications Prior to Visit  Medication Sig Dispense Refill   ARIPiprazole (ABILIFY) 5 MG tablet Take 1 tablet (5 mg total) by mouth daily. 30 tablet 3   AZO-CRANBERRY PO Take 1 tablet by mouth daily as needed.     brimonidine (ALPHAGAN) 0.2 % ophthalmic solution Place 1 drop into both eyes 2 (two) times daily.     buPROPion (WELLBUTRIN XL) 300 MG 24 hr tablet Take 1 tablet (300 mg total) by mouth daily. 90 tablet 1   Carboxymethylcellul-Glycerin (LUBRICATING EYE DROPS OP) Place 1 drop into both eyes daily as needed (dry eyes).     cephALEXin (KEFLEX) 500 MG capsule Take 1 capsule (500 mg total) by mouth 2 (two) times daily. 14 capsule 0   cetirizine (ZYRTEC) 10 MG tablet Take 10 mg by mouth every evening.      Cholecalciferol (DIALYVITE VITAMIN D 5000 PO) Take 4,000 Units by mouth daily.      dorzolamide-timolol (COSOPT) 22.3-6.8 MG/ML ophthalmic solution Place 1 drop into both eyes 2 (two) times daily.     doxycycline (VIBRA-TABS) 100 MG tablet Take 1 tablet (100 mg total) by mouth 2 (two) times daily. 6 tablet 0   fluticasone (FLONASE) 50 MCG/ACT nasal spray USE 2 SPRAY(S) IN EACH NOSTRIL ONCE DAILY AS NEEDED FOR ALLERGIES OR RUNNY NOSE 16 g 5   furosemide (LASIX) 20 MG tablet Take 1 tablet (20 mg total) by mouth daily as  needed. 90 tablet 2   gabapentin (NEURONTIN) 600 MG tablet Take one po at night 90 tablet 3   lisinopril-hydrochlorothiazide (ZESTORETIC) 20-25 MG tablet Take 1 tablet by mouth once daily 90 tablet 1   methylphenidate (  RITALIN) 20 MG tablet One po qAM and one po qNoon 60 tablet 0   methylPREDNISolone (MEDROL) 4 MG tablet 3 tab po day 1, 2 tab po day 1 and 1 tab po day 3 6 tablet 0   Multiple Vitamin (MULTIVITAMIN) tablet Take 1 tablet by mouth daily.     mupirocin ointment (BACTROBAN) 2 % Apply 1 Application topically 2 (two) times daily. 22 g 0   MYRBETRIQ 50 MG TB24 tablet Take 50 mg by mouth daily.     naproxen sodium (ALEVE) 220 MG tablet Take 220 mg by mouth daily as needed (pain).     Probiotic Product (PROBIOTIC DAILY PO) Take by mouth.     sertraline (ZOLOFT) 100 MG tablet TAKE 1 & 1/2 (ONE & ONE-HALF) TABLETS BY MOUTH ONCE DAILY 135 tablet 0   No facility-administered medications prior to visit.    Allergies  Allergen Reactions   Shellfish Allergy Hives, Shortness Of Breath, Itching, Swelling and Rash    Pt had previously carried an epi-pen and had a history of severe reaction to shrimp with breathing problems and swelling of lips and tongue     Review of Systems  Constitutional:  Positive for malaise/fatigue. Negative for fever.  HENT:  Negative for congestion.   Eyes:  Negative for blurred vision.  Respiratory:  Negative for shortness of breath.   Cardiovascular:  Negative for chest pain, palpitations and leg swelling.  Gastrointestinal:  Negative for abdominal pain, blood in stool and nausea.  Genitourinary:  Negative for dysuria and frequency.  Musculoskeletal:  Negative for falls.  Skin:  Negative for rash.       Open wounds on right lower limb, is oozing clear yellow liquid  Neurological:  Negative for dizziness, loss of consciousness and headaches.  Endo/Heme/Allergies:  Negative for environmental allergies.  Psychiatric/Behavioral:  Negative for depression. The  patient is not nervous/anxious.        Objective:    Physical Exam Constitutional:      General: She is not in acute distress.    Appearance: Normal appearance. She is well-developed. She is not toxic-appearing.  HENT:     Head: Normocephalic and atraumatic.     Right Ear: External ear normal.     Left Ear: External ear normal.     Nose: Nose normal.  Eyes:     General:        Right eye: No discharge.        Left eye: No discharge.     Conjunctiva/sclera: Conjunctivae normal.  Neck:     Thyroid: No thyromegaly.  Cardiovascular:     Rate and Rhythm: Normal rate and regular rhythm.     Heart sounds: Normal heart sounds. No murmur heard. Pulmonary:     Effort: Pulmonary effort is normal. No respiratory distress.     Breath sounds: Normal breath sounds.  Abdominal:     General: Bowel sounds are normal.     Palpations: Abdomen is soft.     Tenderness: There is no abdominal tenderness. There is no guarding.  Musculoskeletal:        General: Normal range of motion.     Cervical back: Neck supple.  Lymphadenopathy:     Cervical: No cervical adenopathy.  Skin:    General: Skin is warm and dry.     Comments: 2 large lesions on anterior tibial plateau oozing yellow liquid. Smaller lesion posterior lower right leg. Some surrounding fluctuance  Neurological:     Mental Status: She is  alert and oriented to person, place, and time.  Psychiatric:        Mood and Affect: Mood normal.        Behavior: Behavior normal.        Thought Content: Thought content normal.        Judgment: Judgment normal.     BP (!) 116/54 (BP Location: Left Arm, Patient Position: Sitting, Cuff Size: Large)   Pulse 87   Temp 98 F (36.7 C) (Oral)   Resp 16   Ht 5\' 6"  (1.676 m)   Wt 290 lb (131.5 kg)   LMP  (LMP Unknown)   SpO2 99%   BMI 46.81 kg/m  Wt Readings from Last 3 Encounters:  12/22/22 290 lb (131.5 kg)  12/16/22 290 lb (131.5 kg)  12/11/22 290 lb (131.5 kg)    Diabetic Foot Exam -  Simple   No data filed    Lab Results  Component Value Date   WBC 6.0 02/03/2022   HGB 12.3 02/03/2022   HCT 36.6 02/03/2022   PLT 225.0 02/03/2022   GLUCOSE 81 02/03/2022   CHOL 175 02/03/2022   TRIG 47.0 02/03/2022   HDL 48.20 02/03/2022   LDLCALC 118 (H) 02/03/2022   ALT 9 02/03/2022   AST 14 02/03/2022   NA 138 02/03/2022   K 4.2 02/03/2022   CL 102 02/03/2022   CREATININE 1.08 02/03/2022   BUN 20 02/03/2022   CO2 29 02/03/2022   TSH 0.48 02/03/2022   HGBA1C 5.8 02/03/2022   MICROALBUR 1.17 01/02/2009    Lab Results  Component Value Date   TSH 0.48 02/03/2022   Lab Results  Component Value Date   WBC 6.0 02/03/2022   HGB 12.3 02/03/2022   HCT 36.6 02/03/2022   MCV 90.6 02/03/2022   PLT 225.0 02/03/2022   Lab Results  Component Value Date   NA 138 02/03/2022   K 4.2 02/03/2022   CO2 29 02/03/2022   GLUCOSE 81 02/03/2022   BUN 20 02/03/2022   CREATININE 1.08 02/03/2022   BILITOT 0.6 02/03/2022   ALKPHOS 99 02/03/2022   AST 14 02/03/2022   ALT 9 02/03/2022   PROT 7.5 02/03/2022   ALBUMIN 3.8 02/03/2022   CALCIUM 10.3 02/03/2022   ANIONGAP 11 04/07/2018   GFR 53.31 (L) 02/03/2022   Lab Results  Component Value Date   CHOL 175 02/03/2022   Lab Results  Component Value Date   HDL 48.20 02/03/2022   Lab Results  Component Value Date   LDLCALC 118 (H) 02/03/2022   Lab Results  Component Value Date   TRIG 47.0 02/03/2022   Lab Results  Component Value Date   CHOLHDL 4 02/03/2022   Lab Results  Component Value Date   HGBA1C 5.8 02/03/2022       Assessment & Plan:  Wound infection Assessment & Plan: Non healing wounds on lower right leg. Has had a course of Doxycycline and Cefdinir and she does report this is improving some. Will try a course of augmentin and a femur film is ordered and will likely proceed with CT evaluation of leg after that returns. Is referred to wound clinic due to persistence. Lesion is debrided today with sterile  guaze and peroxide and then lesion was covered with sterile dressing  Orders: -     Ambulatory referral to Wound Clinic -     DG FEMUR, MIN 2 VIEWS RIGHT; Future  Low vitamin D level  Hyperglycemia -     Hemoglobin  A1c  Hyperlipidemia, mixed -     Lipid panel  Essential hypertension Assessment & Plan: Well controlled, no changes to meds. Encouraged heart healthy diet such as the DASH diet and exercise as tolerated.   Orders: -     CBC with Differential/Platelet -     Comprehensive metabolic panel -     TSH  Vitamin D deficiency Assessment & Plan: Supplement and monitor   Orders: -     VITAMIN D 25 Hydroxy (Vit-D Deficiency, Fractures)  Multiple sclerosis (HCC) Assessment & Plan: No recent progression   Other orders -     Amoxicillin-Pot Clavulanate; Take 1 tablet by mouth 2 (two) times daily.  Dispense: 20 tablet; Refill: 0 -     Chlorhexidine Gluconate; Apply topically daily as needed.  Dispense: 3790 mL; Refill: 2    Assessment and Plan    Non-healing lower extremity wound: Persistent despite two courses of antibiotics (Doxycycline and Keflex). Wound culture showed mixed organisms, consistent with normal skin flora. No systemic symptoms of infection. -Order X-ray of the affected leg to rule out bone infection. -Order CT angiogram of the leg to assess blood flow, pending X-ray results. -Change antibiotic to Augmentin, starting after completion of Keflex. -Recommend cleaning the wound with Hibiclens once daily. -Advise elevating the foot above the heart level for 10-15 minutes, 2-3 times a day to improve circulation. -Refer to wound clinic for further management if no improvement with current plan. -Schedule follow-up appointment in a few weeks.  General Health Maintenance: -Check COVID-19 booster vaccine status. If eligible, administer booster.         Danise Edge, MD

## 2022-12-23 NOTE — Assessment & Plan Note (Signed)
Non healing wounds on lower right leg. Has had a course of Doxycycline and Cefdinir and she does report this is improving some. Will try a course of augmentin and a femur film is ordered and will likely proceed with CT evaluation of leg after that returns. Is referred to wound clinic due to persistence. Lesion is debrided today with sterile guaze and peroxide and then lesion was covered with sterile dressing

## 2022-12-23 NOTE — Telephone Encounter (Signed)
Initial Comment Caller states they are needing to verify an x ray order. Translation No Nurse Assessment Nurse: Sharma Covert, RN, Marchelle Folks Date/Time (Eastern Time): 12/22/2022 6:21:57 PM Confirm and document reason for call. If symptomatic, describe symptoms. ---Caller states she is [place] with [phone]) and she is needing to verify an x ray order. Dr. Reuel Derby ordered a right femur xray but the reason for visit is right lower leg wound and pain is reason for visit. Olena Leatherwood was wanting to verify if she needed an order for the Tib/Fib x-ray or if she should do the femur x-ray. Does the patient have any new or worsening symptoms? ---No Please document clinical information provided and list any resource used. ---Paged on call provider. Nurse: Sharma Covert, RN, Marchelle Folks Date/Time Lamount Cohen Time): 12/22/2022 6:38:25 PM Is there an on-call provider listed? ---Yes Disp. Time Lamount Cohen Time) Disposition Final User 12/22/2022 6:28:55 PM Called On-Call Provider Sharma Covert, RN, Marchelle Folks 12/22/2022 6:39:59 PM Lab Call Sharma Covert, RN, Marchelle Folks Reason: Sherron Monday with Olena Leatherwood with Radiology 12/22/2022 6:40:06 PM Clinical Call Yes Sharma Covert, RN, Marchelle Folks Final Disposition 12/22/2022 6:40:06 PM Clinical Call Yes Sharma Covert, RN, Marchelle Folks PLEASE NOTE: All timestamps contained within this report are represented as [place] Standard Time. CONFIDENTIALTY NOTICE: This fax transmission is intended only for the addressee. It contains information that is legally privileged, confidential or otherwise protected from use or disclosure. If you are not the intended recipient, you are strictly prohibited from reviewing, disclosing, copying using or disseminating any of this information or taking any action in reliance on or regarding this information. If you have received this fax in error, please notify us immediately by telephone so that we can arrange for its return to [place]. Phone: [phone], Toll-Free: [phone], Fax:  [phone] Page: 2 of 2 Call Id: [id number] Comments User: Kandice Moos, RN Date/Time Lamount Cohen Time): 12/22/2022 6:39:39 PM Informed Olena Leatherwood that the provider put in the order for the right Tib/Fib x-ray Paging DoctorName Phone DateTime Result/ Outcome Message Type Notes Glenetta Hew- MD [phone] 12/22/2022 6:28:55 PM Called On Call Provider - Reached Doctor Paged Glenetta Hew- MD 12/22/2022 6:36:44 PM Spoke with On Call - General Message Result Provider stated he would put an order in for Right Tib/Fib xray. If pain going up leg then do both Femur and Tib/Fib x-rays. Provider stated if there was any issue with the order then to call him back

## 2022-12-23 NOTE — Assessment & Plan Note (Signed)
Well controlled, no changes to meds. Encouraged heart healthy diet such as the DASH diet and exercise as tolerated.  °

## 2022-12-23 NOTE — Assessment & Plan Note (Signed)
No recent progression.

## 2022-12-30 ENCOUNTER — Other Ambulatory Visit: Payer: Self-pay | Admitting: Family Medicine

## 2022-12-30 DIAGNOSIS — L089 Local infection of the skin and subcutaneous tissue, unspecified: Secondary | ICD-10-CM

## 2022-12-30 NOTE — Assessment & Plan Note (Signed)
She is now on Augmentin. Xray of RLE showed significant soft tissue swelling. D/w radiology and they agree MRI with and w/o contrast would rule out osteomyelitis best. This is ordered. She has been referred to wound clinic as well

## 2022-12-30 NOTE — Assessment & Plan Note (Signed)
Supplement and monitor 

## 2022-12-30 NOTE — Progress Notes (Signed)
Subjective:    Patient ID: Sydney Pace, female    DOB: 1955/06/08, 67 y.o.   MRN: [id number]  No chief complaint on file.   HPI Discussed the use of AI scribe software for clinical note transcription with the patient, who gave verbal consent to proceed.  History of Present Illness   Patient is a 67 yo female in today for annual preventative exam and follow up on chronic medical concerns. She continues to struggle with a non healing wound on her right lower extremity. The patient, with a history of a leg wound, presents for a follow-up visit. The wound has only shown marginal improvement, causing the patient distress and discomfort. The patient reports neglecting their overall health due to the pain and inconvenience caused by the wound. They admit to poor eating habits, which may be contributing to the slow healing process. no fevers, chills, increased myalgias, anorexia, nausea        Past Medical History:  Diagnosis Date   Anemia    Arthritis    on meds   Arthritis 07/06/2017   Constipation    occ   Dehydration 06/09/2016   Depression    on meds   Esophageal reflux 08/08/2012   on meds   GERD (gastroesophageal reflux disease)    on meds   Hypertension    on meds   Inverted nipple    right nipple has always been inverted - per pt   MS (multiple sclerosis) (HCC)    Neuromuscular disorder (HCC)    MS   Other and unspecified hyperlipidemia 08/08/2012   on meds   Seasonal allergies    Thyroid disease     Past Surgical History:  Procedure Laterality Date   COLONOSCOPY  2020   VC-MAC-prep adeq-TA X 3;   HYSTEROSCOPY  02, 04, 2008   with D&C   ORIF WRIST FRACTURE Right 04/07/2018   Procedure: RIGHT WRIST OPEN REDUCTION INTERNAL FIXATION (ORIF);  Surgeon: Cammy Copa, MD;  Location: [facility] OR;  Service: Orthopedics;  Laterality: Right;   POLYPECTOMY  2020   TA x 3;   TMJ ARTHROSCOPY     TONSILLECTOMY     UPPER GASTROINTESTINAL ENDOSCOPY      Family History   Problem Relation Age of Onset   Heart disease Mother        pacemaker   Emphysema Mother    Hypertension Mother    Heart disease Father    Diabetes Father    Heart disease Sister        cad   Sleep apnea Brother    Colon cancer Neg Hx    Esophageal cancer Neg Hx    Rectal cancer Neg Hx    Stomach cancer Neg Hx    Colon polyps Neg Hx     Social History   Socioeconomic History   Marital status: Single    Spouse name: Not on file   Number of children: Not on file   Years of education: Not on file   Highest education level: Not on file  Occupational History   Not on file  Tobacco Use   Smoking status: Never   Smokeless tobacco: Never  Vaping Use   Vaping status: Never Used  Substance and Sexual Activity   Alcohol use: Not Currently    Alcohol/week: 0.0 - 1.0 standard drinks of alcohol    Comment: occassional   Drug use: No   Sexual activity: Never    Comment: 1st intercourse 13 yo-5 partners  Other Topics Concern   Not on file  Social History Narrative   Not on file   Social Determinants of Health   Financial Resource Strain: Low Risk  (11/10/2021)   Overall Financial Resource Strain (CARDIA)    Difficulty of Paying Living Expenses: Not hard at all  Food Insecurity: Food Insecurity Present (12/10/2022)   Hunger Vital Sign    Worried About Running Out of Food in the Last Year: Sometimes true    Ran Out of Food in the Last Year: Sometimes true  Transportation Needs: No Transportation Needs (12/10/2022)   PRAPARE - Administrator, Civil Service (Medical): No    Lack of Transportation (Non-Medical): No  Physical Activity: Inactive (12/10/2022)   Exercise Vital Sign    Days of Exercise per Week: 0 days    Minutes of Exercise per Session: 0 min  Stress: No Stress Concern Present (11/10/2021)   [employer] of Occupational Health - Occupational Stress Questionnaire    Feeling of Stress : Not at all  Social Connections: Socially Isolated (11/10/2021)    Social Connection and Isolation Panel [NHANES]    Frequency of Communication with Friends and Family: Once a week    Frequency of Social Gatherings with Friends and Family: Never    Attends Religious Services: Never    Database administrator or Organizations: No    Attends Banker Meetings: Never    Marital Status: Never married  Intimate Partner Violence: Not At Risk (11/10/2021)   Humiliation, Afraid, Rape, and Kick questionnaire    Fear of Current or Ex-Partner: No    Emotionally Abused: No    Physically Abused: No    Sexually Abused: No    Outpatient Medications Prior to Visit  Medication Sig Dispense Refill   amoxicillin-clavulanate (AUGMENTIN) 875-125 MG tablet Take 1 tablet by mouth 2 (two) times daily. 20 tablet 0   ARIPiprazole (ABILIFY) 5 MG tablet Take 1 tablet (5 mg total) by mouth daily. 30 tablet 3   AZO-CRANBERRY PO Take 1 tablet by mouth daily as needed.     brimonidine (ALPHAGAN) 0.2 % ophthalmic solution Place 1 drop into both eyes 2 (two) times daily.     buPROPion (WELLBUTRIN XL) 300 MG 24 hr tablet Take 1 tablet (300 mg total) by mouth daily. 90 tablet 1   Carboxymethylcellul-Glycerin (LUBRICATING EYE DROPS OP) Place 1 drop into both eyes daily as needed (dry eyes).     cephALEXin (KEFLEX) 500 MG capsule Take 1 capsule (500 mg total) by mouth 2 (two) times daily. 14 capsule 0   cetirizine (ZYRTEC) 10 MG tablet Take 10 mg by mouth every evening.      chlorhexidine (HIBICLENS) 4 % external liquid Apply topically daily as needed. 3790 mL 2   Cholecalciferol (DIALYVITE VITAMIN D 5000 PO) Take 4,000 Units by mouth daily.      dorzolamide-timolol (COSOPT) 22.3-6.8 MG/ML ophthalmic solution Place 1 drop into both eyes 2 (two) times daily.     fluticasone (FLONASE) 50 MCG/ACT nasal spray USE 2 SPRAY(S) IN EACH NOSTRIL ONCE DAILY AS NEEDED FOR ALLERGIES OR RUNNY NOSE 16 g 5   gabapentin (NEURONTIN) 600 MG tablet Take one po at night 90 tablet 3    lisinopril-hydrochlorothiazide (ZESTORETIC) 20-25 MG tablet Take 1 tablet by mouth once daily 90 tablet 1   methylphenidate (RITALIN) 20 MG tablet One po qAM and one po qNoon 60 tablet 0   Multiple Vitamin (MULTIVITAMIN) tablet Take 1 tablet by mouth daily.  mupirocin ointment (BACTROBAN) 2 % Apply 1 Application topically 2 (two) times daily. 22 g 0   MYRBETRIQ 50 MG TB24 tablet Take 50 mg by mouth daily.     naproxen sodium (ALEVE) 220 MG tablet Take 220 mg by mouth daily as needed (pain).     Probiotic Product (PROBIOTIC DAILY PO) Take by mouth.     sertraline (ZOLOFT) 100 MG tablet TAKE 1 & 1/2 (ONE & ONE-HALF) TABLETS BY MOUTH ONCE DAILY 135 tablet 0   doxycycline (VIBRA-TABS) 100 MG tablet Take 1 tablet (100 mg total) by mouth 2 (two) times daily. 6 tablet 0   furosemide (LASIX) 20 MG tablet Take 1 tablet (20 mg total) by mouth daily as needed. 90 tablet 2   methylPREDNISolone (MEDROL) 4 MG tablet 3 tab po day 1, 2 tab po day 1 and 1 tab po day 3 6 tablet 0   No facility-administered medications prior to visit.    Allergies  Allergen Reactions   Shellfish Allergy Hives, Shortness Of Breath, Itching, Swelling and Rash    Pt had previously carried an epi-pen and had a history of severe reaction to shrimp with breathing problems and swelling of lips and tongue     Review of Systems  Constitutional:  Positive for malaise/fatigue. Negative for chills and fever.  HENT:  Negative for congestion and hearing loss.   Eyes:  Negative for discharge.  Respiratory:  Negative for cough, sputum production and shortness of breath.   Cardiovascular:  Positive for leg swelling. Negative for chest pain and palpitations.  Gastrointestinal:  Negative for abdominal pain, blood in stool, constipation, diarrhea, heartburn, nausea and vomiting.  Genitourinary:  Negative for dysuria, frequency, hematuria and urgency.  Musculoskeletal:  Positive for joint pain and myalgias. Negative for back pain and falls.   Skin:  Negative for rash.       Wound right lower lower extremity stable significant granulation tissue. No surrounding fluctuance. Lesions are weeping serosanguinous liquid, new bullae LLE  Neurological:  Negative for dizziness, sensory change, loss of consciousness, weakness and headaches.  Endo/Heme/Allergies:  Negative for environmental allergies. Does not bruise/bleed easily.  Psychiatric/Behavioral:  Negative for depression and suicidal ideas. The patient is not nervous/anxious and does not have insomnia.        Objective:    Physical Exam Constitutional:      General: She is not in acute distress.    Appearance: Normal appearance. She is obese. She is not diaphoretic.  HENT:     Head: Normocephalic and atraumatic.     Right Ear: Tympanic membrane, ear canal and external ear normal.     Left Ear: Tympanic membrane, ear canal and external ear normal.     Nose: Nose normal.     Mouth/Throat:     Mouth: Mucous membranes are moist.     Pharynx: Oropharynx is clear. No oropharyngeal exudate.  Eyes:     General: No scleral icterus.       Right eye: No discharge.        Left eye: No discharge.     Conjunctiva/sclera: Conjunctivae normal.     Pupils: Pupils are equal, round, and reactive to light.  Neck:     Thyroid: No thyromegaly.  Cardiovascular:     Rate and Rhythm: Normal rate and regular rhythm.     Heart sounds: Normal heart sounds. No murmur heard. Pulmonary:     Effort: Pulmonary effort is normal. No respiratory distress.     Breath sounds: Normal  breath sounds. No wheezing or rales.  Abdominal:     General: Bowel sounds are normal. There is no distension.     Palpations: Abdomen is soft. There is no mass.     Tenderness: There is no abdominal tenderness.  Musculoskeletal:        General: No tenderness. Normal range of motion.     Cervical back: Normal range of motion and neck supple.  Lymphadenopathy:     Cervical: No cervical adenopathy.  Skin:    General:  Skin is warm and dry.     Findings: No rash.     Comments: Wound right lower lower extremity anterior is largest lesion above ankle a smaller lesion is caudal noted granulation tissue without any surrounding fluctuance but serosanguinous drainage noted. Some new fluid filled Bullae noted on lle  Neurological:     General: No focal deficit present.     Mental Status: She is alert and oriented to person, place, and time.     Cranial Nerves: No cranial nerve deficit.     Coordination: Coordination normal.     Deep Tendon Reflexes: Reflexes are normal and symmetric. Reflexes normal.  Psychiatric:        Mood and Affect: Mood normal.        Behavior: Behavior normal.        Thought Content: Thought content normal.        Judgment: Judgment normal.    BP 126/73 (BP Location: Left Arm, Patient Position: Sitting, Cuff Size: Large)   Pulse 97   Temp 98 F (36.7 C) (Oral)   Resp 16   Ht 5\' 6"  (1.676 m)   Wt 290 lb (131.5 kg)   LMP  (LMP Unknown)   SpO2 98%   BMI 46.81 kg/m  Wt Readings from Last 3 Encounters:  12/31/22 290 lb (131.5 kg)  12/22/22 290 lb (131.5 kg)  12/16/22 290 lb (131.5 kg)    Diabetic Foot Exam - Simple   No data filed    Lab Results  Component Value Date   WBC 6.8 12/31/2022   HGB 11.5 (L) 12/31/2022   HCT 34.6 (L) 12/31/2022   PLT 227.0 12/31/2022   GLUCOSE 89 12/31/2022   CHOL 154 12/31/2022   TRIG 46.0 12/31/2022   HDL 55.10 12/31/2022   LDLCALC 90 12/31/2022   ALT 14 12/31/2022   AST 19 12/31/2022   NA 140 12/31/2022   K 4.2 12/31/2022   CL 106 12/31/2022   CREATININE 0.96 12/31/2022   BUN 22 12/31/2022   CO2 29 12/31/2022   TSH 0.89 12/31/2022   HGBA1C 5.8 12/31/2022   MICROALBUR 1.17 01/02/2009    Lab Results  Component Value Date   TSH 0.89 12/31/2022   Lab Results  Component Value Date   WBC 6.8 12/31/2022   HGB 11.5 (L) 12/31/2022   HCT 34.6 (L) 12/31/2022   MCV 90.1 12/31/2022   PLT 227.0 12/31/2022   Lab Results  Component  Value Date   NA 140 12/31/2022   K 4.2 12/31/2022   CO2 29 12/31/2022   GLUCOSE 89 12/31/2022   BUN 22 12/31/2022   CREATININE 0.96 12/31/2022   BILITOT 0.6 12/31/2022   ALKPHOS 106 12/31/2022   AST 19 12/31/2022   ALT 14 12/31/2022   PROT 7.3 12/31/2022   ALBUMIN 3.8 12/31/2022   CALCIUM 10.0 12/31/2022   ANIONGAP 11 04/07/2018   GFR 61.02 12/31/2022   Lab Results  Component Value Date   CHOL 154 12/31/2022  Lab Results  Component Value Date   HDL 55.10 12/31/2022   Lab Results  Component Value Date   LDLCALC 90 12/31/2022   Lab Results  Component Value Date   TRIG 46.0 12/31/2022   Lab Results  Component Value Date   CHOLHDL 3 12/31/2022   Lab Results  Component Value Date   HGBA1C 5.8 12/31/2022       Assessment & Plan:  Wound infection Assessment & Plan: She is now on Augmentin. Xray of RLE showed significant soft tissue swelling. D/w radiology and they agree MRI with and w/o contrast would rule out osteomyelitis best. This is ordered. She has been referred to wound clinic as well  Orders: -     Ambulatory referral to Vascular Surgery  Vitamin D deficiency Assessment & Plan: Supplement and monitor   Orders: -     VITAMIN D 25 Hydroxy (Vit-D Deficiency, Fractures)  Preventative health care Assessment & Plan: Patient encouraged to maintain heart healthy diet, regular exercise, adequate sleep. Consider daily probiotics. Take medications as prescribed. Labs ordered and reviewed. Colonoscopy 2023 repeat 2028. MM was 10/2021 repeat this year. Last Dexa scan 07/2019. Repeat this year. Discussed need for ACP documents.  Pap 2019 with Dr Audie Box, GYN    Essential hypertension -     Ambulatory referral to Vascular Surgery -     CBC with Differential/Platelet -     TSH  Hyperglycemia -     Ambulatory referral to Vascular Surgery -     Comprehensive metabolic panel -     Hemoglobin A1c  Hyperlipidemia, mixed -     Ambulatory referral to Vascular  Surgery -     Lipid panel  Other orders -     Furosemide; Take 1 tablet (20 mg total) by mouth daily as needed.  Dispense: 90 tablet; Refill: 2 -     Potassium Chloride Crys ER; 1 tab po prn 2nd Furosemide dose in a day  Dispense: 90 tablet; Refill: 1    Assessment and Plan    Lower Extremity Edema Not currently taking Lasix. Discussed the importance of elevating legs, reducing salt intake, and taking Lasix to manage edema. -Start Lasix 20mg  daily, with a second dose as needed for weight gain greater than 2 pounds in 24 hours or increased edema. -Refer to a cardiovascular specialist for further evaluation of circulation.  Wound Care Non-healing wound with granulation tissue. Discussed the importance of cleaning the wound and maintaining good nutrition for wound healing. -Continue cleaning wound with peroxide and applying Neosporin. -Order MRI to evaluate for possible infection in the bone and assess circulation. -Continue Augmentin, and extend or change antibiotic if wound worsens after completion of current course.  General Health Maintenance -Encouraged to take a daily multivitamin and ensure adequate protein intake for wound healing. -Check CBC and kidney function today to monitor for potential need for additional antibiotics. -Check potassium level today due to initiation of Lasix. -Consider referral to a nutritionist to improve overall diet. -Follow-up in 3-4 weeks to monitor wound healing.         Danise Edge, MD

## 2022-12-30 NOTE — Assessment & Plan Note (Signed)
Patient encouraged to maintain heart healthy diet, regular exercise, adequate sleep. Consider daily probiotics. Take medications as prescribed. Labs ordered and reviewed. Colonoscopy 2023 repeat 2028. MM was 10/2021 repeat this year. Last Dexa scan 07/2019. Repeat this year. Discussed need for ACP documents.  Pap 2019 with Dr Audie Box, GYN

## 2022-12-31 ENCOUNTER — Encounter: Payer: Self-pay | Admitting: Family Medicine

## 2022-12-31 ENCOUNTER — Ambulatory Visit: Payer: Medicare (Managed Care) | Admitting: Family Medicine

## 2022-12-31 VITALS — BP 126/73 | HR 97 | Temp 98.0°F | Resp 16 | Ht 66.0 in | Wt 290.0 lb

## 2022-12-31 DIAGNOSIS — Z0000 Encounter for general adult medical examination without abnormal findings: Secondary | ICD-10-CM

## 2022-12-31 DIAGNOSIS — I10 Essential (primary) hypertension: Secondary | ICD-10-CM

## 2022-12-31 DIAGNOSIS — L089 Local infection of the skin and subcutaneous tissue, unspecified: Secondary | ICD-10-CM

## 2022-12-31 DIAGNOSIS — E559 Vitamin D deficiency, unspecified: Secondary | ICD-10-CM

## 2022-12-31 DIAGNOSIS — T148XXA Other injury of unspecified body region, initial encounter: Secondary | ICD-10-CM | POA: Diagnosis not present

## 2022-12-31 DIAGNOSIS — R739 Hyperglycemia, unspecified: Secondary | ICD-10-CM

## 2022-12-31 DIAGNOSIS — E782 Mixed hyperlipidemia: Secondary | ICD-10-CM

## 2022-12-31 LAB — LIPID PANEL
Cholesterol: 154 mg/dL (ref 0–200)
HDL: 55.1 mg/dL (ref >39.00)
LDL Cholesterol: 90 mg/dL (ref 0–99)
NonHDL: 98.83
Total CHOL/HDL Ratio: 3
Triglycerides: 46 mg/dL (ref 0.0–149.0)
VLDL: 9.2 mg/dL (ref 0.0–40.0)

## 2022-12-31 LAB — COMPREHENSIVE METABOLIC PANEL
ALT: 14 U/L (ref 0–35)
AST: 19 U/L (ref 0–37)
Albumin: 3.8 g/dL (ref 3.5–5.2)
Alkaline Phosphatase: 106 U/L (ref 39–117)
BUN: 22 mg/dL (ref 6–23)
CO2: 29 mEq/L (ref 19–32)
Calcium: 10 mg/dL (ref 8.4–10.5)
Chloride: 106 mEq/L (ref 96–112)
Creatinine, Ser: 0.96 mg/dL (ref 0.40–1.20)
GFR: 61.02 mL/min (ref >60.00)
Glucose, Bld: 89 mg/dL (ref 70–99)
Potassium: 4.2 mEq/L (ref 3.5–5.1)
Sodium: 140 mEq/L (ref 135–145)
Total Bilirubin: 0.6 mg/dL (ref 0.2–1.2)
Total Protein: 7.3 g/dL (ref 6.0–8.3)

## 2022-12-31 LAB — CBC WITH DIFFERENTIAL/PLATELET
Basophils Absolute: 0 10*3/uL (ref 0.0–0.1)
Basophils Relative: 0.4 % (ref 0.0–3.0)
Eosinophils Absolute: 0.2 10*3/uL (ref 0.0–0.7)
Eosinophils Relative: 2.8 % (ref 0.0–5.0)
HCT: 34.6 % — ABNORMAL LOW (ref 36.0–46.0)
Hemoglobin: 11.5 g/dL — ABNORMAL LOW (ref 12.0–15.0)
Lymphocytes Relative: 26.2 % (ref 12.0–46.0)
Lymphs Abs: 1.8 10*3/uL (ref 0.7–4.0)
MCHC: 33.1 g/dL (ref 30.0–36.0)
MCV: 90.1 fl (ref 78.0–100.0)
Monocytes Absolute: 0.5 10*3/uL (ref 0.1–1.0)
Monocytes Relative: 6.8 % (ref 3.0–12.0)
Neutro Abs: 4.4 10*3/uL (ref 1.4–7.7)
Neutrophils Relative %: 63.8 % (ref 43.0–77.0)
Platelets: 227 10*3/uL (ref 150.0–400.0)
RBC: 3.84 Mil/uL — ABNORMAL LOW (ref 3.87–5.11)
RDW: 15.1 % (ref 11.5–15.5)
WBC: 6.8 10*3/uL (ref 4.0–10.5)

## 2022-12-31 LAB — TSH: TSH: 0.89 u[IU]/mL (ref 0.35–5.50)

## 2022-12-31 LAB — VITAMIN D 25 HYDROXY (VIT D DEFICIENCY, FRACTURES): VITD: 40.03 ng/mL (ref 30.00–100.00)

## 2022-12-31 LAB — HEMOGLOBIN A1C: Hgb A1c MFr Bld: 5.8 % (ref 4.6–6.5)

## 2022-12-31 MED ORDER — POTASSIUM CHLORIDE CRYS ER 20 MEQ PO TBCR: EXTENDED_RELEASE_TABLET | ORAL | 1 refills | Status: DC

## 2022-12-31 MED ORDER — FUROSEMIDE 20 MG PO TABS: 20.0000 mg | ORAL_TABLET | Freq: Every day | ORAL | 2 refills | Status: AC | PRN

## 2022-12-31 NOTE — Patient Instructions (Signed)
Venous Ulcer A venous ulcer is a shallow sore on your lower leg. Venous ulcer is the most common type of lower leg ulcer. You may have venous ulcers on one leg or on both legs. This condition most often develops around your ankles. This type of ulcer may last for a long time (chronic ulcer) or it may return often (recurrent ulcer). What are the causes? This condition is caused by poor blood flow in your legs. The poor flow causes blood to pool in your legs. This can break the skin, causing an ulcer. What increases the risk? You are more likely to develop this condition if: You are female. You are 68 years of age or older. You have had a leg ulcer in the past. You have varicose veins. You have clots in your lower leg veins (deep vein thrombosis). You have irritation and swelling (inflammation) of your leg veins (phlebitis). You have recently been pregnant. You have a family history of chronic venous insufficiency. This is a condition where the leg veins do not pump enough blood from the legs to the heart. Your risk may be higher if: You are not active. You are overweight. You smoke. What are the signs or symptoms? The main symptom of this condition is an open sore near your ankle. Other symptoms may include: Swelling. Fluid coming from the ulcer. Bleeding. Itching. Pain and swelling. This gets worse when you stand up and feels better when you raise your leg. Changes in the skin, such as: Thick skin. Blotchy skin. Dark skin. How is this treated? Treatments include: Keeping your leg raised (elevated). Wearing a type of bandage or stocking to keep pressure (compression) on the veins of your leg. Taking medicines, including antibiotic medicines. Cleaning your ulcer and removing any dead tissue from the wound. Using bandages and wraps that have medicines in them to cover your ulcer. Closing the wound using a piece of skin taken from another area of your body (graft). Healing may take a  long time. You may need to see a foot doctor or a vein specialist. Follow these instructions at home: Medicines Take or apply over-the-counter and prescription medicines only as told by your doctor. If you were prescribed antibiotics, take them as told by your doctor. Do not stop taking them even if you start to feel better. Ask your doctor if you should take aspirin before long trips. Wound care Follow instructions from your doctor about how to take care of your wound. Make sure you: Wash your hands with soap and water for at least 20 seconds before and after you change your bandage. If you cannot use soap and water, use hand sanitizer. Change your bandage. Leave stitches or skin glue in place for at least 2 weeks. Leave tape strips alone unless you are told to take them off. You may trim the edges of the tape strips if they curl up. Ask when you should remove your bandage. If your bandage is dry and sticks to your leg when you try to remove it, moisten or wet the bandage with saline solution or water alone to make it easier to remove. Check your wound every day for signs of infection. Have a caregiver do this for you if you are not able to do it yourself. Check for: More redness, swelling, or pain. More fluid or blood. Warmth. Pus or a bad smell. Activity Get up to take short walks every 1 to 2 hours. Ask for help if you feel weak or unsteady.  Rest with your legs raised during the day. If you can, keep your legs above the level of your heart for 30 minutes, 3-4 times a day, or as told by your doctor. Do not sit with your legs crossed. Return to your normal activities when your doctor says that it is safe. General instructions  Wear elastic stockings, compression stockings, or support hose as told by your doctor. Raise the foot of your bed as told by your doctor. Do not smoke or use any products that contain nicotine or tobacco. If you need help quitting, ask your doctor. Try to eat a  heart healthy and low salt diet. Keep a healthy body weight. Keep all follow-up visits. Your doctor will check if your ulcer is healing and change treatments if needed. Contact a doctor if: Your ulcer is getting larger or is not healing. Your pain gets worse. You have any signs of infection. You have a fever. This information is not intended to replace advice given to you by your health care provider. Make sure you discuss any questions you have with your health care provider. Document Revised: 01/05/2022 Document Reviewed: 01/05/2022 Elsevier Patient Education  2024 [employer].

## 2023-01-04 ENCOUNTER — Other Ambulatory Visit: Payer: Self-pay | Admitting: Family Medicine

## 2023-01-04 DIAGNOSIS — Z1231 Encounter for screening mammogram for malignant neoplasm of breast: Secondary | ICD-10-CM

## 2023-01-05 ENCOUNTER — Ambulatory Visit
Admission: RE | Admit: 2023-01-05 | Discharge: 2023-01-05 | Disposition: A | Discharge status: Home / Self Care | Payer: Medicare (Managed Care) | Source: Ambulatory Visit | Attending: Family Medicine | Admitting: Family Medicine

## 2023-01-05 DIAGNOSIS — Z1231 Encounter for screening mammogram for malignant neoplasm of breast: Secondary | ICD-10-CM

## 2023-01-22 ENCOUNTER — Ambulatory Visit: Payer: Medicare (Managed Care) | Admitting: Internal Medicine

## 2023-01-24 ENCOUNTER — Emergency Department (HOSPITAL_COMMUNITY): Payer: Medicare (Managed Care)

## 2023-01-24 ENCOUNTER — Inpatient Hospital Stay (HOSPITAL_COMMUNITY)
Admission: EM | Admit: 2023-01-24 | Discharge: 2023-02-03 | DRG: 871 | Disposition: A | Discharge status: Skilled Nursing Facility | Payer: Medicare (Managed Care) | Attending: Internal Medicine | Admitting: Internal Medicine

## 2023-01-24 DIAGNOSIS — L97929 Non-pressure chronic ulcer of unspecified part of left lower leg with unspecified severity: Secondary | ICD-10-CM | POA: Diagnosis present

## 2023-01-24 DIAGNOSIS — M1990 Unspecified osteoarthritis, unspecified site: Secondary | ICD-10-CM | POA: Diagnosis not present

## 2023-01-24 DIAGNOSIS — L97919 Non-pressure chronic ulcer of unspecified part of right lower leg with unspecified severity: Secondary | ICD-10-CM | POA: Diagnosis present

## 2023-01-24 DIAGNOSIS — R32 Unspecified urinary incontinence: Secondary | ICD-10-CM | POA: Diagnosis not present

## 2023-01-24 DIAGNOSIS — E872 Acidosis, unspecified: Secondary | ICD-10-CM | POA: Diagnosis present

## 2023-01-24 DIAGNOSIS — R000 Tachycardia, unspecified: Secondary | ICD-10-CM | POA: Diagnosis not present

## 2023-01-24 DIAGNOSIS — D649 Anemia, unspecified: Secondary | ICD-10-CM | POA: Diagnosis present

## 2023-01-24 DIAGNOSIS — R918 Other nonspecific abnormal finding of lung field: Secondary | ICD-10-CM | POA: Diagnosis present

## 2023-01-24 DIAGNOSIS — R600 Localized edema: Secondary | ICD-10-CM | POA: Diagnosis not present

## 2023-01-24 DIAGNOSIS — M1711 Unilateral primary osteoarthritis, right knee: Secondary | ICD-10-CM | POA: Diagnosis not present

## 2023-01-24 DIAGNOSIS — R41841 Cognitive communication deficit: Secondary | ICD-10-CM | POA: Diagnosis not present

## 2023-01-24 DIAGNOSIS — G9340 Encephalopathy, unspecified: Secondary | ICD-10-CM | POA: Diagnosis not present

## 2023-01-24 DIAGNOSIS — G9341 Metabolic encephalopathy: Secondary | ICD-10-CM | POA: Diagnosis present

## 2023-01-24 DIAGNOSIS — Z1152 Encounter for screening for COVID-19: Secondary | ICD-10-CM | POA: Diagnosis not present

## 2023-01-24 DIAGNOSIS — T148XXA Other injury of unspecified body region, initial encounter: Secondary | ICD-10-CM | POA: Diagnosis not present

## 2023-01-24 DIAGNOSIS — R7881 Bacteremia: Secondary | ICD-10-CM | POA: Diagnosis not present

## 2023-01-24 DIAGNOSIS — N3281 Overactive bladder: Secondary | ICD-10-CM | POA: Diagnosis not present

## 2023-01-24 DIAGNOSIS — B9689 Other specified bacterial agents as the cause of diseases classified elsewhere: Secondary | ICD-10-CM | POA: Diagnosis not present

## 2023-01-24 DIAGNOSIS — E876 Hypokalemia: Secondary | ICD-10-CM | POA: Diagnosis present

## 2023-01-24 DIAGNOSIS — R739 Hyperglycemia, unspecified: Secondary | ICD-10-CM | POA: Diagnosis not present

## 2023-01-24 DIAGNOSIS — M7989 Other specified soft tissue disorders: Secondary | ICD-10-CM | POA: Diagnosis not present

## 2023-01-24 DIAGNOSIS — E782 Mixed hyperlipidemia: Secondary | ICD-10-CM | POA: Diagnosis present

## 2023-01-24 DIAGNOSIS — K219 Gastro-esophageal reflux disease without esophagitis: Secondary | ICD-10-CM | POA: Diagnosis present

## 2023-01-24 DIAGNOSIS — R6520 Severe sepsis without septic shock: Secondary | ICD-10-CM | POA: Diagnosis present

## 2023-01-24 DIAGNOSIS — Z91013 Allergy to seafood: Secondary | ICD-10-CM

## 2023-01-24 DIAGNOSIS — E049 Nontoxic goiter, unspecified: Secondary | ICD-10-CM | POA: Diagnosis present

## 2023-01-24 DIAGNOSIS — A411 Sepsis due to other specified staphylococcus: Secondary | ICD-10-CM | POA: Diagnosis present

## 2023-01-24 DIAGNOSIS — L0390 Cellulitis, unspecified: Secondary | ICD-10-CM | POA: Diagnosis present

## 2023-01-24 DIAGNOSIS — L03119 Cellulitis of unspecified part of limb: Secondary | ICD-10-CM | POA: Diagnosis not present

## 2023-01-24 DIAGNOSIS — R509 Fever, unspecified: Secondary | ICD-10-CM | POA: Diagnosis not present

## 2023-01-24 DIAGNOSIS — F32A Depression, unspecified: Secondary | ICD-10-CM | POA: Diagnosis present

## 2023-01-24 DIAGNOSIS — Z825 Family history of asthma and other chronic lower respiratory diseases: Secondary | ICD-10-CM

## 2023-01-24 DIAGNOSIS — I951 Orthostatic hypotension: Secondary | ICD-10-CM | POA: Diagnosis present

## 2023-01-24 DIAGNOSIS — L03115 Cellulitis of right lower limb: Secondary | ICD-10-CM | POA: Diagnosis present

## 2023-01-24 DIAGNOSIS — I10 Essential (primary) hypertension: Secondary | ICD-10-CM | POA: Diagnosis present

## 2023-01-24 DIAGNOSIS — Z79899 Other long term (current) drug therapy: Secondary | ICD-10-CM

## 2023-01-24 DIAGNOSIS — F909 Attention-deficit hyperactivity disorder, unspecified type: Secondary | ICD-10-CM | POA: Diagnosis present

## 2023-01-24 DIAGNOSIS — Z7401 Bed confinement status: Secondary | ICD-10-CM | POA: Diagnosis not present

## 2023-01-24 DIAGNOSIS — E039 Hypothyroidism, unspecified: Secondary | ICD-10-CM | POA: Diagnosis present

## 2023-01-24 DIAGNOSIS — Z7409 Other reduced mobility: Secondary | ICD-10-CM | POA: Diagnosis present

## 2023-01-24 DIAGNOSIS — D72829 Elevated white blood cell count, unspecified: Secondary | ICD-10-CM

## 2023-01-24 DIAGNOSIS — R930 Abnormal findings on diagnostic imaging of skull and head, not elsewhere classified: Secondary | ICD-10-CM | POA: Diagnosis not present

## 2023-01-24 DIAGNOSIS — M6282 Rhabdomyolysis: Secondary | ICD-10-CM | POA: Diagnosis present

## 2023-01-24 DIAGNOSIS — F988 Other specified behavioral and emotional disorders with onset usually occurring in childhood and adolescence: Secondary | ICD-10-CM | POA: Diagnosis present

## 2023-01-24 DIAGNOSIS — L089 Local infection of the skin and subcutaneous tissue, unspecified: Secondary | ICD-10-CM | POA: Diagnosis not present

## 2023-01-24 DIAGNOSIS — R410 Disorientation, unspecified: Secondary | ICD-10-CM | POA: Diagnosis not present

## 2023-01-24 DIAGNOSIS — A419 Sepsis, unspecified organism: Secondary | ICD-10-CM | POA: Diagnosis not present

## 2023-01-24 DIAGNOSIS — G4700 Insomnia, unspecified: Secondary | ICD-10-CM | POA: Diagnosis not present

## 2023-01-24 DIAGNOSIS — Z8249 Family history of ischemic heart disease and other diseases of the circulatory system: Secondary | ICD-10-CM | POA: Diagnosis not present

## 2023-01-24 DIAGNOSIS — G35 Multiple sclerosis: Secondary | ICD-10-CM | POA: Diagnosis present

## 2023-01-24 DIAGNOSIS — R2689 Other abnormalities of gait and mobility: Secondary | ICD-10-CM | POA: Diagnosis not present

## 2023-01-24 DIAGNOSIS — R0902 Hypoxemia: Secondary | ICD-10-CM | POA: Diagnosis not present

## 2023-01-24 DIAGNOSIS — I878 Other specified disorders of veins: Secondary | ICD-10-CM | POA: Diagnosis present

## 2023-01-24 DIAGNOSIS — Z6841 Body Mass Index (BMI) 40.0 and over, adult: Secondary | ICD-10-CM

## 2023-01-24 DIAGNOSIS — R109 Unspecified abdominal pain: Secondary | ICD-10-CM | POA: Diagnosis not present

## 2023-01-24 DIAGNOSIS — N179 Acute kidney failure, unspecified: Secondary | ICD-10-CM | POA: Diagnosis present

## 2023-01-24 DIAGNOSIS — Z833 Family history of diabetes mellitus: Secondary | ICD-10-CM

## 2023-01-24 DIAGNOSIS — M6281 Muscle weakness (generalized): Secondary | ICD-10-CM | POA: Diagnosis not present

## 2023-01-24 DIAGNOSIS — R531 Weakness: Secondary | ICD-10-CM | POA: Diagnosis not present

## 2023-01-24 DIAGNOSIS — F332 Major depressive disorder, recurrent severe without psychotic features: Secondary | ICD-10-CM

## 2023-01-24 LAB — COMPREHENSIVE METABOLIC PANEL
ALT: 33 U/L (ref 0–44)
AST: 66 U/L — ABNORMAL HIGH (ref 15–41)
Albumin: 3.2 g/dL — ABNORMAL LOW (ref 3.5–5.0)
Alkaline Phosphatase: 67 U/L (ref 38–126)
Anion gap: 7 (ref 5–15)
BUN: 22 mg/dL (ref 8–23)
CO2: 23 mmol/L (ref 22–32)
Calcium: 9.3 mg/dL (ref 8.9–10.3)
Chloride: 104 mmol/L (ref 98–111)
Creatinine, Ser: 1.19 mg/dL — ABNORMAL HIGH (ref 0.44–1.00)
GFR, Estimated: 50 mL/min — ABNORMAL LOW (ref >60)
Glucose, Bld: 171 mg/dL — ABNORMAL HIGH (ref 70–99)
Potassium: 3.4 mmol/L — ABNORMAL LOW (ref 3.5–5.1)
Sodium: 134 mmol/L — ABNORMAL LOW (ref 135–145)
Total Bilirubin: 1.4 mg/dL — ABNORMAL HIGH (ref 0.3–1.2)
Total Protein: 7.7 g/dL (ref 6.5–8.1)

## 2023-01-24 LAB — URINALYSIS, W/ REFLEX TO CULTURE (INFECTION SUSPECTED)
Bacteria, UA: NONE SEEN
Bilirubin Urine: NEGATIVE
Glucose, UA: NEGATIVE mg/dL
Ketones, ur: 5 mg/dL — AB
Leukocytes,Ua: NEGATIVE
Nitrite: NEGATIVE
Protein, ur: >=300 mg/dL — AB
Specific Gravity, Urine: 1.026 (ref 1.005–1.030)
pH: 5 (ref 5.0–8.0)

## 2023-01-24 LAB — CBC WITH DIFFERENTIAL/PLATELET
Abs Immature Granulocytes: 0.09 10*3/uL — ABNORMAL HIGH (ref 0.00–0.07)
Basophils Absolute: 0 10*3/uL (ref 0.0–0.1)
Basophils Relative: 0 %
Eosinophils Absolute: 0 10*3/uL (ref 0.0–0.5)
Eosinophils Relative: 0 %
HCT: 33.9 % — ABNORMAL LOW (ref 36.0–46.0)
Hemoglobin: 10.9 g/dL — ABNORMAL LOW (ref 12.0–15.0)
Immature Granulocytes: 1 %
Lymphocytes Relative: 5 %
Lymphs Abs: 0.7 10*3/uL (ref 0.7–4.0)
MCH: 29.1 pg (ref 26.0–34.0)
MCHC: 32.2 g/dL (ref 30.0–36.0)
MCV: 90.4 fL (ref 80.0–100.0)
Monocytes Absolute: 0.4 10*3/uL (ref 0.1–1.0)
Monocytes Relative: 2 %
Neutro Abs: 15 10*3/uL — ABNORMAL HIGH (ref 1.7–7.7)
Neutrophils Relative %: 92 %
Platelets: 206 10*3/uL (ref 150–400)
RBC: 3.75 MIL/uL — ABNORMAL LOW (ref 3.87–5.11)
RDW: 15.5 % (ref 11.5–15.5)
WBC: 16.2 10*3/uL — ABNORMAL HIGH (ref 4.0–10.5)
nRBC: 0 % (ref 0.0–0.2)

## 2023-01-24 LAB — I-STAT CG4 LACTIC ACID, ED: Lactic Acid, Venous: 2.2 mmol/L (ref 0.5–1.9)

## 2023-01-24 LAB — SARS CORONAVIRUS 2 BY RT PCR: SARS Coronavirus 2 by RT PCR: NEGATIVE

## 2023-01-24 LAB — CK: Total CK: 1652 U/L — ABNORMAL HIGH (ref 38–234)

## 2023-01-24 MED ORDER — VANCOMYCIN HCL 2000 MG/400ML IV SOLN
2000.0000 mg | Freq: Once | INTRAVENOUS | Status: AC
  Administered 2023-01-24: 2000 mg via INTRAVENOUS
  Filled 2023-01-24: qty 400

## 2023-01-24 MED ORDER — VANCOMYCIN HCL IN DEXTROSE 1-5 GM/200ML-% IV SOLN: 1000.0000 mg | Freq: Once | INTRAVENOUS | Status: DC

## 2023-01-24 MED ORDER — METRONIDAZOLE 500 MG/100ML IV SOLN
500.0000 mg | Freq: Once | INTRAVENOUS | Status: AC
  Administered 2023-01-24: 500 mg via INTRAVENOUS
  Filled 2023-01-24: qty 100

## 2023-01-24 MED ORDER — LACTATED RINGERS IV BOLUS (SEPSIS)
1000.0000 mL | Freq: Once | INTRAVENOUS | Status: AC
  Administered 2023-01-24: 1000 mL via INTRAVENOUS

## 2023-01-24 MED ORDER — IOHEXOL 300 MG/ML  SOLN
75.0000 mL | Freq: Once | INTRAMUSCULAR | Status: AC | PRN
  Administered 2023-01-25: 75 mL via INTRAVENOUS

## 2023-01-24 MED ORDER — SODIUM CHLORIDE 0.9 % IV SOLN
2.0000 g | Freq: Once | INTRAVENOUS | Status: AC
  Administered 2023-01-24: 2 g via INTRAVENOUS
  Filled 2023-01-24: qty 12.5

## 2023-01-24 MED ORDER — ACETAMINOPHEN 650 MG RE SUPP
650.0000 mg | Freq: Once | RECTAL | Status: AC
  Administered 2023-01-24: 650 mg via RECTAL
  Filled 2023-01-24: qty 1

## 2023-01-24 MED ORDER — LACTATED RINGERS IV SOLN: INTRAVENOUS | Status: AC

## 2023-01-24 NOTE — ED Triage Notes (Signed)
Pt found at home on her toilet after family was unable to contact her. Pt states she isnt sure how long she was there. Pt last know normal was Monday. Pt oriented to self only. Normal orientation is alert and oriented x4. PT also has bil leg wounds with drainage as well as red streaking up her right leg up to her thigh.

## 2023-01-24 NOTE — ED Provider Notes (Signed)
Sydney Pace   CSN: [id number] Arrival date & time: 01/24/23  2146     History {Add pertinent medical, surgical, social history, OB history to HPI:1} Chief Complaint  Patient presents with   Altered Mental Status    Sydney Pace is a 67 y.o. female.  67 year old female with a history of hypertension, hyperlipidemia, MS presents to the emergency department for altered mental status.  Family was unable to contact the patient so they went to her house and found her on the commode.  She was unable to get up.  Is unclear how long she was sitting on the commode prior to being found.  EMS was called and transported patient to the emergency department.  Family report that patient is typically alert and oriented x 4.  She was last seen normal on Monday.  Was only A&O x 1 with EMS.  She states she has had her BLE wounds for "a while". She denies headache, chest pain, SOB, cough, N/V, urinary symptoms.  The history is provided by the patient. No language interpreter was used.  Altered Mental Status      Home Medications Prior to Admission medications   Medication Sig Start Date End Date Taking? Authorizing Provider  amoxicillin-clavulanate (AUGMENTIN) 875-125 MG tablet Take 1 tablet by mouth 2 (two) times daily. 12/22/22   Bradd Canary, MD  ARIPiprazole (ABILIFY) 5 MG tablet Take 1 tablet (5 mg total) by mouth daily. 05/04/22 05/04/23  Bobbye Morton, MD  AZO-CRANBERRY PO Take 1 tablet by mouth daily as needed.    [provider]  brimonidine (ALPHAGAN) 0.2 % ophthalmic solution Place 1 drop into both eyes 2 (two) times daily.    [provider]  buPROPion (WELLBUTRIN XL) 300 MG 24 hr tablet Take 1 tablet (300 mg total) by mouth daily. 11/18/22   Sater, Pearletha Furl, MD  Carboxymethylcellul-Glycerin (LUBRICATING EYE DROPS OP) Place 1 drop into both eyes daily as needed (dry eyes).    [provider]   cephALEXin (KEFLEX) 500 MG capsule Take 1 capsule (500 mg total) by mouth 2 (two) times daily. 12/16/22   Saguier, Ramon Dredge, PA-C  cetirizine (ZYRTEC) 10 MG tablet Take 10 mg by mouth every evening.     [provider]  chlorhexidine (HIBICLENS) 4 % external liquid Apply topically daily as needed. 12/22/22   Bradd Canary, MD  Cholecalciferol (DIALYVITE VITAMIN D 5000 PO) Take 4,000 Units by mouth daily.     [provider]  dorzolamide-timolol (COSOPT) 22.3-6.8 MG/ML ophthalmic solution Place 1 drop into both eyes 2 (two) times daily. 09/23/21   [provider]  fluticasone (FLONASE) 50 MCG/ACT nasal spray USE 2 SPRAY(S) IN EACH NOSTRIL ONCE DAILY AS NEEDED FOR ALLERGIES OR RUNNY NOSE 08/03/22   Bradd Canary, MD  furosemide (LASIX) 20 MG tablet Take 1 tablet (20 mg total) by mouth daily as needed. 12/31/22   Bradd Canary, MD  gabapentin (NEURONTIN) 600 MG tablet Take one po at night 11/18/22   Sater, Pearletha Furl, MD  lisinopril-hydrochlorothiazide (ZESTORETIC) 20-25 MG tablet Take 1 tablet by mouth once daily 09/21/22   Bradd Canary, MD  methylphenidate (RITALIN) 20 MG tablet One po qAM and one po qNoon 11/18/22   Sater, Pearletha Furl, MD  Multiple Vitamin (MULTIVITAMIN) tablet Take 1 tablet by mouth daily.    [provider]  mupirocin ointment (BACTROBAN) 2 % Apply 1 Application topically 2 (two) times daily. 12/11/22  Saguier, Ramon Dredge, PA-C  MYRBETRIQ 50 MG TB24 tablet Take 50 mg by mouth daily. 11/11/22   [provider]  naproxen sodium (ALEVE) 220 MG tablet Take 220 mg by mouth daily as needed (pain).    [provider]  potassium chloride SA (KLOR-CON M) 20 MEQ tablet 1 tab po prn 2nd Furosemide dose in a day 12/31/22   Bradd Canary, MD  Probiotic Product (PROBIOTIC DAILY PO) Take by mouth.    [provider]  sertraline (ZOLOFT) 100 MG tablet TAKE 1 & 1/2 (ONE & ONE-HALF) TABLETS BY MOUTH ONCE DAILY 08/31/22   Bradd Canary, MD       Allergies    Shellfish allergy    Review of Systems   Review of Systems  Unable to perform ROS: Mental status change    Physical Exam Updated Vital Signs BP (!) 171/96 (BP Location: Left Arm)   Pulse (!) 109   Temp (!) 100.7 F (38.2 C) (Oral)   Resp 17   Wt 131.5 kg   LMP  (LMP Unknown)   SpO2 100%   BMI 46.79 kg/m   Physical Exam Vitals and nursing Pace reviewed.  Constitutional:      General: She is not in acute distress.    Appearance: She is well-developed. She is ill-appearing.     Comments: Lethargic appearing, ill.  HENT:     Head: Normocephalic and atraumatic.  Eyes:     General: No scleral icterus.    Extraocular Movements: EOM normal.     Conjunctiva/sclera: Conjunctivae normal.  Cardiovascular:     Rate and Rhythm: Regular rhythm. Tachycardia present.     Pulses: Normal pulses.  Pulmonary:     Effort: Pulmonary effort is normal. No respiratory distress.     Breath sounds: No stridor. No wheezing.     Comments: Respirations even and unlabored Abdominal:     Palpations: Abdomen is soft.     Comments: Soft, obese abdomen.  Genitourinary:    Comments: Exam chaperoned by RN. There is some ecchymosis to b/l buttocks, likely from prolonged sitting. No sacral wound/ulceration. Musculoskeletal:        General: Swelling present. Normal range of motion.     Cervical back: Normal range of motion.  Skin:    General: Skin is warm and dry.     Coloration: Skin is not pale.     Findings: Erythema present.     Comments: Ulcerative skin wounds to anterior BLE with serous drainage. RLE with erythema, heat to touch, and lymphangitic streaking tracking up to the groin.  Neurological:     Mental Status: She is alert and oriented to person, place, and time.     Coordination: Coordination normal.  Psychiatric:        Mood and Affect: Mood and affect normal.        Behavior: Behavior normal.          ED Results / Procedures / Treatments   Labs (all labs  ordered are listed, but only abnormal results are displayed) Labs Reviewed  RESP PANEL BY RT-PCR (RSV, FLU A&B, COVID)  RVPGX2  CULTURE, BLOOD (ROUTINE X 2)  CULTURE, BLOOD (ROUTINE X 2)  SARS CORONAVIRUS 2 BY RT PCR  COMPREHENSIVE METABOLIC PANEL  CBC WITH DIFFERENTIAL/PLATELET  PROTIME-INR  APTT  URINALYSIS, W/ REFLEX TO CULTURE (INFECTION SUSPECTED)  CK  I-STAT CG4 LACTIC ACID, ED    EKG None  Radiology No results found.  Procedures .Critical Care  Performed by:  Antony Madura, PA-C Authorized by: Antony Madura, PA-C   Critical care provider statement:    Critical care time (minutes):  30   Critical care time was exclusive of:  Separately billable procedures and treating other patients   Critical care was necessary to treat or prevent imminent or life-threatening deterioration of the following conditions:  Sepsis   Critical care was time spent personally by me on the following activities:  Development of treatment plan with patient or surrogate, discussions with consultants, evaluation of patient's response to treatment, examination of patient, ordering and review of laboratory studies, ordering and review of radiographic studies, ordering and performing treatments and interventions, pulse oximetry, re-evaluation of patient's condition, review of old charts and obtaining history from patient or surrogate   I assumed direction of critical care for this patient from another provider in my specialty: no     Care discussed with: admitting provider     {Document cardiac monitor, telemetry assessment procedure when appropriate:1}  Medications Ordered in ED Medications  lactated ringers infusion (has no administration in time range)  lactated ringers bolus 1,000 mL (has no administration in time range)  ceFEPIme (MAXIPIME) 2 g in sodium chloride 0.9 % 100 mL IVPB (has no administration in time range)  metroNIDAZOLE (FLAGYL) IVPB 500 mg (has no administration in time range)   vancomycin (VANCOREADY) IVPB 2000 mg/400 mL (has no administration in time range)  acetaminophen (TYLENOL) suppository 650 mg (has no administration in time range)    ED Course/ Medical Decision Making/ A&P Clinical Course as of 01/24/23 2236  Sun Jan 24, 2023  2222 Rectal temperature 101.8 F.  Cellulitis tracks from just above the right ankle all the way to the proximal femur.  There is no crepitus.  Serous drainage from ulcerative skin wounds distally.  There is also some ecchymosis along the buttock likely from prolonged sitting on the commode today. [KH]  2223 Alert and oriented to self and hospital. Cannot tell me the year. [KH]    Clinical Course User Index [KH] Antony Madura, PA-C   {   Click here for ABCD2, HEART and other calculatorsREFRESH Pace before signing :1}                              Medical Decision Making Amount and/or Complexity of Data Reviewed Labs: ordered. Radiology: ordered. ECG/medicine tests: ordered.  Risk OTC drugs. Prescription drug management. Decision regarding hospitalization.   ***  {Document critical care time when appropriate:1} {Document review of labs and clinical decision tools ie heart score, Chads2Vasc2 etc:1}  {Document your independent review of radiology images, and any outside records:1} {Document your discussion with family members, caretakers, and with consultants:1} {Document social determinants of health affecting pt's care:1} {Document your decision making why or why not admission, treatments were needed:1} Final Clinical Impression(s) / ED Diagnoses Final diagnoses:  Sepsis, due to unspecified organism, unspecified whether acute organ dysfunction present [facility])    Rx / DC Orders ED Discharge Orders     None

## 2023-01-24 NOTE — Sepsis Progress Note (Signed)
Elink following for Sepsis Protocol 

## 2023-01-25 ENCOUNTER — Inpatient Hospital Stay (HOSPITAL_COMMUNITY): Payer: Medicare (Managed Care)

## 2023-01-25 ENCOUNTER — Encounter (HOSPITAL_COMMUNITY): Payer: Self-pay | Admitting: Family Medicine

## 2023-01-25 ENCOUNTER — Other Ambulatory Visit: Payer: Self-pay

## 2023-01-25 DIAGNOSIS — Z6841 Body Mass Index (BMI) 40.0 and over, adult: Secondary | ICD-10-CM | POA: Diagnosis not present

## 2023-01-25 DIAGNOSIS — A419 Sepsis, unspecified organism: Secondary | ICD-10-CM | POA: Diagnosis not present

## 2023-01-25 DIAGNOSIS — I10 Essential (primary) hypertension: Secondary | ICD-10-CM | POA: Diagnosis present

## 2023-01-25 DIAGNOSIS — M6282 Rhabdomyolysis: Secondary | ICD-10-CM | POA: Diagnosis present

## 2023-01-25 DIAGNOSIS — Z1152 Encounter for screening for COVID-19: Secondary | ICD-10-CM | POA: Diagnosis not present

## 2023-01-25 DIAGNOSIS — L089 Local infection of the skin and subcutaneous tissue, unspecified: Secondary | ICD-10-CM

## 2023-01-25 DIAGNOSIS — A411 Sepsis due to other specified staphylococcus: Secondary | ICD-10-CM | POA: Diagnosis present

## 2023-01-25 DIAGNOSIS — G35 Multiple sclerosis: Secondary | ICD-10-CM

## 2023-01-25 DIAGNOSIS — R7881 Bacteremia: Secondary | ICD-10-CM | POA: Diagnosis not present

## 2023-01-25 DIAGNOSIS — L97929 Non-pressure chronic ulcer of unspecified part of left lower leg with unspecified severity: Secondary | ICD-10-CM | POA: Diagnosis present

## 2023-01-25 DIAGNOSIS — N179 Acute kidney failure, unspecified: Secondary | ICD-10-CM | POA: Diagnosis present

## 2023-01-25 DIAGNOSIS — R6520 Severe sepsis without septic shock: Secondary | ICD-10-CM | POA: Diagnosis present

## 2023-01-25 DIAGNOSIS — Z8249 Family history of ischemic heart disease and other diseases of the circulatory system: Secondary | ICD-10-CM | POA: Diagnosis not present

## 2023-01-25 DIAGNOSIS — E782 Mixed hyperlipidemia: Secondary | ICD-10-CM | POA: Diagnosis present

## 2023-01-25 DIAGNOSIS — E876 Hypokalemia: Secondary | ICD-10-CM | POA: Diagnosis present

## 2023-01-25 DIAGNOSIS — Z7401 Bed confinement status: Secondary | ICD-10-CM | POA: Diagnosis not present

## 2023-01-25 DIAGNOSIS — T148XXA Other injury of unspecified body region, initial encounter: Secondary | ICD-10-CM

## 2023-01-25 DIAGNOSIS — R531 Weakness: Secondary | ICD-10-CM | POA: Diagnosis not present

## 2023-01-25 DIAGNOSIS — Z79899 Other long term (current) drug therapy: Secondary | ICD-10-CM | POA: Diagnosis not present

## 2023-01-25 DIAGNOSIS — G9340 Encephalopathy, unspecified: Secondary | ICD-10-CM

## 2023-01-25 DIAGNOSIS — E039 Hypothyroidism, unspecified: Secondary | ICD-10-CM | POA: Diagnosis present

## 2023-01-25 DIAGNOSIS — D72829 Elevated white blood cell count, unspecified: Secondary | ICD-10-CM | POA: Diagnosis not present

## 2023-01-25 DIAGNOSIS — F909 Attention-deficit hyperactivity disorder, unspecified type: Secondary | ICD-10-CM | POA: Diagnosis present

## 2023-01-25 DIAGNOSIS — B9689 Other specified bacterial agents as the cause of diseases classified elsewhere: Secondary | ICD-10-CM | POA: Diagnosis not present

## 2023-01-25 DIAGNOSIS — L03115 Cellulitis of right lower limb: Secondary | ICD-10-CM | POA: Diagnosis present

## 2023-01-25 DIAGNOSIS — L97919 Non-pressure chronic ulcer of unspecified part of right lower leg with unspecified severity: Secondary | ICD-10-CM | POA: Diagnosis present

## 2023-01-25 DIAGNOSIS — F32A Depression, unspecified: Secondary | ICD-10-CM | POA: Diagnosis present

## 2023-01-25 DIAGNOSIS — L0390 Cellulitis, unspecified: Secondary | ICD-10-CM | POA: Diagnosis present

## 2023-01-25 DIAGNOSIS — I951 Orthostatic hypotension: Secondary | ICD-10-CM | POA: Diagnosis present

## 2023-01-25 DIAGNOSIS — R600 Localized edema: Secondary | ICD-10-CM | POA: Diagnosis not present

## 2023-01-25 DIAGNOSIS — D649 Anemia, unspecified: Secondary | ICD-10-CM | POA: Diagnosis present

## 2023-01-25 DIAGNOSIS — E049 Nontoxic goiter, unspecified: Secondary | ICD-10-CM | POA: Diagnosis present

## 2023-01-25 DIAGNOSIS — E872 Acidosis, unspecified: Secondary | ICD-10-CM | POA: Diagnosis present

## 2023-01-25 DIAGNOSIS — G9341 Metabolic encephalopathy: Secondary | ICD-10-CM | POA: Diagnosis present

## 2023-01-25 LAB — CBC WITH DIFFERENTIAL/PLATELET
Abs Immature Granulocytes: 0.12 10*3/uL — ABNORMAL HIGH (ref 0.00–0.07)
Basophils Absolute: 0 10*3/uL (ref 0.0–0.1)
Basophils Relative: 0 %
Eosinophils Absolute: 0 10*3/uL (ref 0.0–0.5)
Eosinophils Relative: 0 %
HCT: 31.1 % — ABNORMAL LOW (ref 36.0–46.0)
Hemoglobin: 10 g/dL — ABNORMAL LOW (ref 12.0–15.0)
Immature Granulocytes: 1 %
Lymphocytes Relative: 7 %
Lymphs Abs: 1 10*3/uL (ref 0.7–4.0)
MCH: 29.9 pg (ref 26.0–34.0)
MCHC: 32.2 g/dL (ref 30.0–36.0)
MCV: 92.8 fL (ref 80.0–100.0)
Monocytes Absolute: 0.4 10*3/uL (ref 0.1–1.0)
Monocytes Relative: 3 %
Neutro Abs: 12.6 10*3/uL — ABNORMAL HIGH (ref 1.7–7.7)
Neutrophils Relative %: 89 %
Platelets: 162 10*3/uL (ref 150–400)
RBC: 3.35 MIL/uL — ABNORMAL LOW (ref 3.87–5.11)
RDW: 15.8 % — ABNORMAL HIGH (ref 11.5–15.5)
WBC: 14.2 10*3/uL — ABNORMAL HIGH (ref 4.0–10.5)
nRBC: 0 % (ref 0.0–0.2)

## 2023-01-25 LAB — RESP PANEL BY RT-PCR (RSV, FLU A&B, COVID)  RVPGX2
Influenza A by PCR: NEGATIVE
Influenza B by PCR: NEGATIVE
Resp Syncytial Virus by PCR: NEGATIVE
SARS Coronavirus 2 by RT PCR: NEGATIVE

## 2023-01-25 LAB — BASIC METABOLIC PANEL
Anion gap: 9 (ref 5–15)
BUN: 21 mg/dL (ref 8–23)
CO2: 20 mmol/L — ABNORMAL LOW (ref 22–32)
Calcium: 9.2 mg/dL (ref 8.9–10.3)
Chloride: 109 mmol/L (ref 98–111)
Creatinine, Ser: 1.03 mg/dL — ABNORMAL HIGH (ref 0.44–1.00)
GFR, Estimated: 59 mL/min — ABNORMAL LOW (ref >60)
Glucose, Bld: 131 mg/dL — ABNORMAL HIGH (ref 70–99)
Potassium: 3.4 mmol/L — ABNORMAL LOW (ref 3.5–5.1)
Sodium: 138 mmol/L (ref 135–145)

## 2023-01-25 LAB — BLOOD GAS, VENOUS
Acid-base deficit: 0.6 mmol/L (ref 0.0–2.0)
Bicarbonate: 23.2 mmol/L (ref 20.0–28.0)
O2 Saturation: 91.6 %
Patient temperature: 36.1
pCO2, Ven: 34 mmHg — ABNORMAL LOW (ref 44–60)
pH, Ven: 7.44 — ABNORMAL HIGH (ref 7.25–7.43)
pO2, Ven: 55 mmHg — ABNORMAL HIGH (ref 32–45)

## 2023-01-25 LAB — MRSA NEXT GEN BY PCR, NASAL: MRSA by PCR Next Gen: NOT DETECTED

## 2023-01-25 LAB — LACTIC ACID, PLASMA: Lactic Acid, Venous: 1.3 mmol/L (ref 0.5–1.9)

## 2023-01-25 LAB — MAGNESIUM: Magnesium: 2 mg/dL (ref 1.7–2.4)

## 2023-01-25 MED ORDER — IOHEXOL 300 MG/ML  SOLN
100.0000 mL | Freq: Once | INTRAMUSCULAR | Status: AC | PRN
  Administered 2023-01-25: 100 mL via INTRAVENOUS

## 2023-01-25 MED ORDER — ONDANSETRON HCL 4 MG PO TABS: 4.0000 mg | ORAL_TABLET | Freq: Four times a day (QID) | ORAL | Status: DC | PRN

## 2023-01-25 MED ORDER — ONDANSETRON HCL 4 MG/2ML IJ SOLN: 4.0000 mg | Freq: Four times a day (QID) | INTRAMUSCULAR | Status: DC | PRN

## 2023-01-25 MED ORDER — ACETAMINOPHEN 325 MG PO TABS
650.0000 mg | ORAL_TABLET | Freq: Four times a day (QID) | ORAL | Status: DC | PRN
  Administered 2023-01-26 – 2023-01-29 (×4): 650 mg via ORAL
  Filled 2023-01-25 (×5): qty 2

## 2023-01-25 MED ORDER — AMLODIPINE BESYLATE 10 MG PO TABS: 10.0000 mg | ORAL_TABLET | Freq: Once | ORAL | Status: DC

## 2023-01-25 MED ORDER — SERTRALINE HCL 50 MG PO TABS: 100.0000 mg | ORAL_TABLET | Freq: Every day | ORAL | Status: DC

## 2023-01-25 MED ORDER — ACETAMINOPHEN 650 MG RE SUPP
650.0000 mg | Freq: Four times a day (QID) | RECTAL | Status: DC | PRN
  Administered 2023-01-25: 650 mg via RECTAL
  Filled 2023-01-25: qty 1

## 2023-01-25 MED ORDER — POLYVINYL ALCOHOL 1.4 % OP SOLN: Freq: Every day | OPHTHALMIC | Status: DC | PRN

## 2023-01-25 MED ORDER — METHYLPHENIDATE HCL 20 MG PO TABS: 20.0000 mg | ORAL_TABLET | Freq: Two times a day (BID) | ORAL | Status: DC

## 2023-01-25 MED ORDER — SENNOSIDES-DOCUSATE SODIUM 8.6-50 MG PO TABS
1.0000 | ORAL_TABLET | Freq: Every evening | ORAL | Status: DC | PRN
  Administered 2023-02-02: 1 via ORAL
  Filled 2023-01-25: qty 1

## 2023-01-25 MED ORDER — LACTATED RINGERS IV BOLUS
1000.0000 mL | Freq: Once | INTRAVENOUS | Status: AC
  Administered 2023-01-25: 1000 mL via INTRAVENOUS

## 2023-01-25 MED ORDER — PIPERACILLIN-TAZOBACTAM 3.375 G IVPB
3.3750 g | Freq: Three times a day (TID) | INTRAVENOUS | Status: DC
  Administered 2023-01-25 – 2023-01-28 (×10): 3.375 g via INTRAVENOUS
  Filled 2023-01-25 (×10): qty 50

## 2023-01-25 MED ORDER — ENOXAPARIN SODIUM 40 MG/0.4ML IJ SOSY
40.0000 mg | PREFILLED_SYRINGE | INTRAMUSCULAR | Status: DC
  Administered 2023-01-25 – 2023-02-03 (×9): 40 mg via SUBCUTANEOUS
  Filled 2023-01-25 (×9): qty 0.4

## 2023-01-25 NOTE — Progress Notes (Signed)
A consult was received from an ED physician for vanc and cefepime per pharmacy dosing.  The patient's profile has been reviewed for ht/wt/allergies/indication/available labs.   A one time order has been placed for vanc 2 gm & cefepime 2 gm.  Further antibiotics/pharmacy consults should be ordered by admitting physician if indicated.                       Thank you, Arley Phenix RPh 01/25/2023, 1:18 AM

## 2023-01-25 NOTE — Progress Notes (Signed)
PROGRESS NOTE  Sydney Pace  DOB: [birth year]  PCP: Bradd Canary, MD ZOX:[id number]  DOA: 01/24/2023  LOS: 0 days  Hospital Day: 2  Brief narrative: Shenitha Degolyer is a 67 y.o. female with PMH significant for morbid obesity, multiple sclerosis, HTN, GERD, hypothyroidism, depression, constipation who lives alone. 8/25, patient family went to check on her as they had not been able to contact her lately.  They found her confused, sitting on the toilet and brought to the ED  In the ED, patient had a fever of 100.7, blood pressure 171/76, breathing room air She was somnolent, disoriented She was noted to have extensive erythema and swelling of right lower extremity up to the groin.  Ulcerative skin on both lower extremity with drainage. Labs showed WBC count elevated to 16.2, lactic acid elevated to 2.2, CK level 1652 Respiratory virus panel unremarkable Urinalysis showed hazy yellow urine with large amount of hemoglobin, no bacteria nitrates or leukocytes Blood culture was sent CT head unremarkable for acute findings Started on IV antibiotics, IV fluid Admitted to [facility] Wound care consulted  Subjective: Patient was seen and examined this morning.  Pleasant elderly African-American female.  Lying in bed.  Opens eyes on verbal command.  Falls right back asleep.  Not able to engage in conversation today. Overnight, had temperature spikes up to 101.8.  Blood pressure remained elevated Last set of labs this morning with WC count 14.2, hemoglobin 10, potassium 3.4  Assessment and plan: Severe sepsis POA Extensive Cellulitis RLE  B/L LE ulcerations Met severe sepsis criteria with fever, leukocytosis, lactic acidosis, altered mental status, source of infection Cultures sent Currently on bolus for IV antibiotics Wound care consulted WBC count improving.  Lactic acid normalized. Recent Labs  Lab 01/24/23 2240 01/24/23 2253 01/25/23 0343  WBC 16.2*  --  14.2*  LATICACIDVEN  --  2.2*  1.3   Acute metabolic encephalopathy Due to above infection.  Continue monitor mental status change   Mild rhabdomyolysis Due to being in the same position for long time. CK level elevated to 1600s.  Continue to monitor on IV fluid. Recent Labs  Lab 01/24/23 2240  CKTOTAL 1,652*    Multiple sclerosis   CT head negative for new lesions. If patient with poor response to antibiotics, consider MRI brain, cervical spine to evaluate for MS flare   History of hypertension Orthostatic hypotension Per admission H&P, patient was hypotensive and hence midodrine was resumed.  Blood pressure in 150s this morning.  Currently not on midodrine. It seems patient was on lisinopril, HCTZ at home.  Pending med list entry by pharmacy tech  Goiter CT chest showed diffuse enlarged thyroid slightly larger than last seen in 2010.  No underlying adenopathy.  Radiologist also reported that thyroid has been previously evaluated with ultrasound and biopsy   ADHD Depression PTA meds-Abilify, Zoloft, bupropion, Neurontin, Ritalin  Pulmonary nodules Per CT chest, patient had stable left upper lobe nodule from 20/10. No further follow-up is recommended.   Mobility: Encourage ambulation.  PT eval when mental status improves  Goals of care   Code Status: Full Code     DVT prophylaxis:  enoxaparin (LOVENOX) injection 40 mg Start: 01/25/23 1000   Antimicrobials: IV Zosyn Fluid: LR at 125 mL currently.  I will reduce to 75 mL/h Consultants: None Family Communication: None at bedside  Status: Inpatient Level of care:  Telemetry   Patient is from: Home Needs to continue in-hospital care: Needs IV antibiotics, IV fluid.  Mental status  is still poor Anticipated d/c to: Pending clinical course    Diet:  Diet Order             Diet NPO time specified Except for: Sips with Meds  Diet effective now                   Scheduled Meds:  amLODipine  10 mg Oral Once   enoxaparin (LOVENOX)  injection  40 mg Subcutaneous Q24H   sertraline  100 mg Oral Daily    PRN meds: acetaminophen **OR** acetaminophen, ondansetron **OR** ondansetron (ZOFRAN) IV, polyvinyl alcohol, senna-docusate   Infusions:   lactated ringers 125 mL/hr at 01/25/23 1034   piperacillin-tazobactam (ZOSYN)  IV 3.375 g (01/25/23 1610)    Antimicrobials: Anti-infectives (From admission, onward)    Start     Dose/Rate Route Frequency Ordered Stop   01/25/23 0600  piperacillin-tazobactam (ZOSYN) IVPB 3.375 g        3.375 g 12.5 mL/hr over 240 Minutes Intravenous Every 8 hours 01/25/23 0025     01/24/23 2230  vancomycin (VANCOREADY) IVPB 2000 mg/400 mL        2,000 mg 200 mL/hr over 120 Minutes Intravenous  Once 01/24/23 2216 01/25/23 0200   01/24/23 2215  ceFEPIme (MAXIPIME) 2 g in sodium chloride 0.9 % 100 mL IVPB        2 g 200 mL/hr over 30 Minutes Intravenous  Once 01/24/23 2210 01/25/23 0104   01/24/23 2215  metroNIDAZOLE (FLAGYL) IVPB 500 mg        500 mg 100 mL/hr over 60 Minutes Intravenous  Once 01/24/23 2210 01/25/23 0104   01/24/23 2215  vancomycin (VANCOCIN) IVPB 1000 mg/200 mL premix  Status:  Discontinued        1,000 mg 200 mL/hr over 60 Minutes Intravenous  Once 01/24/23 2210 01/24/23 2216       Nutritional status:  Body mass index is 45.41 kg/m.          Objective: Vitals:   01/25/23 0248 01/25/23 0424  BP:  (!) 156/74  Pulse:  95  Resp:  17  Temp: (!) 100.6 F (38.1 C) 99.4 F (37.4 C)  SpO2:  97%    Intake/Output Summary (Last 24 hours) at 01/25/2023 1328 Last data filed at 01/25/2023 0631 Gross per 24 hour  Intake 1702.55 ml  Output --  Net 1702.55 ml   Filed Weights   01/24/23 2200  Weight: 131.5 kg   Weight change:  Body mass index is 45.41 kg/m.   Physical Exam: General exam: Pleasant elderly African-American female.  Not in physical distress Skin: No rashes, lesions or ulcers. HEENT: Atraumatic, normocephalic, no obvious bleeding Lungs: Clear to  auscultation bilaterally CVS: Regular rate and rhythm, no murmur GI/Abd soft nontender, nondistended, bowel sound present CNS: Opens eyes on verbal command.  Falls right back asleep Psychiatry: Sad affect Extremities: Mild pedal edema on the left.  Right lower extremities swollen, red and tender throughout  Data Review: I have personally reviewed the laboratory data and studies available.  F/u labs ordered Unresulted Labs (From admission, onward)     Start     Ordered   02/01/23 0500  Creatinine, serum  (enoxaparin (LOVENOX)    CrCl >/= 30 ml/min)  Weekly,   R     Comments: while on enoxaparin therapy    01/25/23 0017            Total time spent in review of labs and imaging, patient evaluation, formulation of  plan, documentation and communication with family: 55 minutes  Signed, Lorin Glass, MD [facility] 01/25/2023

## 2023-01-25 NOTE — Consult Note (Addendum)
WOC Nurse Consult Note: Reason for Consult:Bilateral LE wounds, chronic, nonhealing, R>L, now with surrounding erythema and increased drainage to right. See photodocumentation of LE wounds provided to EMR by ED Provider. Also with DTPI to right buttock, POA. Patient was found sitting on toilet at home by family, confused. Wound type:full thickness, infectious (LE), pressure (buttock) Pressure Injury POA: Yes Measurement:Per Nursing flow Sheet (LE)).  Bedside RN to measure and document measurements of DTPI to right buttock with next application of dressing today Wound bed:Red (left) red and with yellow fibrinous tissue (Right) Drainage (amount, consistency, odor) serous Periwound: RLE with erythema, edema to right thigh Dressing procedure/placement/frequency:I have provided Nursing with guidance for the care of these lesions using pure hypochlorous acid [place], [place] # [phone]) to cleanse LE wounds and surrounding skin via a soak daily, then remove and pat dry. Topical care with be with antimicrobial nonadherent (xeroform) topped with dry gauze, an ABD pad and secured with a few turns of Kerlix roll gauze/paper tape. Feet are to be placed into Prevalon boots. Systemic antibiotics have been initiated. Turning side to die to minimize time in the supine position is recommended. A silicone foam is to be placed over the right buttock DTPI after a NS cleanse and placement of a folded layer of xeroform gauze. A mattress replacement with low air loss feature is ordered today.  WOC nursing team will not follow, but will remain available to this patient, the nursing and medical teams.  Please re-consult if needed.  Thank you for inviting [place] to participate in this patient's Plan of Care.  Ladona Mow, MSN, RN, CNS, GNP, Leda Min, [employer], [employer] phone:  [phone]

## 2023-01-25 NOTE — Plan of Care (Signed)
?  Problem: Clinical Measurements: ?Goal: Respiratory complications will improve ?Outcome: Progressing ?Goal: Cardiovascular complication will be avoided ?Outcome: Progressing ?  ?Problem: Safety: ?Goal: Ability to remain free from injury will improve ?Outcome: Progressing ?  ?

## 2023-01-25 NOTE — H&P (Signed)
PCP:   Bradd Canary, MD   Chief Complaint:  Altered mentation  HPI: This 67 y/o female with PMH of MS, HLD, hypothyroidism, HTN, depression.  Patient lives alone.  Her family had not been able to contact her, they went to the house.  They found her confused, sitting on the toilet.  She was brought to the ER.  In the ER presenting vitals 171/96, 109, 17, Tmax 101.8.  Patient encephalopathic, somnolent.  A & O x 2.  WBC 16.2, LA 2.2, CK 1,652, creatinine 1.19.  COVID-negative.  CT chest unchanged from prior.  Diffusely enlarged thyroid previously worked up. Patient's RLE with extensive erythema and swelling up to groin.  Ulcerative skin B/L LE with drainage.  Review of Systems:  Unable to obtain d/t patients encephalopathic state  Past Medical History: Past Medical History:  Diagnosis Date   Anemia    Arthritis    on meds   Arthritis 07/06/2017   Constipation    occ   Dehydration 06/09/2016   Depression    on meds   Esophageal reflux 08/08/2012   on meds   GERD (gastroesophageal reflux disease)    on meds   Hypertension    on meds   Inverted nipple    right nipple has always been inverted - per pt   MS (multiple sclerosis) (HCC)    Neuromuscular disorder (HCC)    MS   Other and unspecified hyperlipidemia 08/08/2012   on meds   Seasonal allergies    Thyroid disease    Past Surgical History:  Procedure Laterality Date   COLONOSCOPY  2020   VC-MAC-prep adeq-TA X 3;   HYSTEROSCOPY  02, 04, 2008   with D&C   ORIF WRIST FRACTURE Right 04/07/2018   Procedure: RIGHT WRIST OPEN REDUCTION INTERNAL FIXATION (ORIF);  Surgeon: Cammy Copa, MD;  Location: [facility] OR;  Service: Orthopedics;  Laterality: Right;   POLYPECTOMY  2020   TA x 3;   TMJ ARTHROSCOPY     TONSILLECTOMY     UPPER GASTROINTESTINAL ENDOSCOPY      Medications: Prior to Admission medications   Medication Sig Start Date End Date Taking? Authorizing Provider  methylPREDNISolone (MEDROL) 4 MG tablet  Take 4 mg by mouth See admin instructions. 3 TABS PO ONCE DAILY FOR 1 DAY, 2 TABS PO ONCE DAILY FOR 1 DAY, 1 TAB ONCE DAILY FOR ONE DAY   Yes [provider]  amoxicillin-clavulanate (AUGMENTIN) 875-125 MG tablet Take 1 tablet by mouth 2 (two) times daily. 12/22/22   Bradd Canary, MD  ARIPiprazole (ABILIFY) 5 MG tablet Take 1 tablet (5 mg total) by mouth daily. 05/04/22 05/04/23  Bobbye Morton, MD  AZO-CRANBERRY PO Take 1 tablet by mouth daily as needed.    [provider]  brimonidine (ALPHAGAN) 0.2 % ophthalmic solution Place 1 drop into both eyes 2 (two) times daily.    [provider]  buPROPion (WELLBUTRIN XL) 300 MG 24 hr tablet Take 1 tablet (300 mg total) by mouth daily. 11/18/22   Sater, Pearletha Furl, MD  Carboxymethylcellul-Glycerin (LUBRICATING EYE DROPS OP) Place 1 drop into both eyes daily as needed (dry eyes).    [provider]  cephALEXin (KEFLEX) 500 MG capsule Take 1 capsule (500 mg total) by mouth 2 (two) times daily. 12/16/22   Saguier, Ramon Dredge, PA-C  cetirizine (ZYRTEC) 10 MG tablet Take 10 mg by mouth every evening.     [provider]  chlorhexidine (HIBICLENS) 4 % external  liquid Apply topically daily as needed. 12/22/22   Bradd Canary, MD  Cholecalciferol (DIALYVITE VITAMIN D 5000 PO) Take 4,000 Units by mouth daily.     [provider]  dorzolamide-timolol (COSOPT) 22.3-6.8 MG/ML ophthalmic solution Place 1 drop into both eyes 2 (two) times daily. 09/23/21   [provider]  fluticasone (FLONASE) 50 MCG/ACT nasal spray USE 2 SPRAY(S) IN EACH NOSTRIL ONCE DAILY AS NEEDED FOR ALLERGIES OR RUNNY NOSE 08/03/22   Bradd Canary, MD  furosemide (LASIX) 20 MG tablet Take 1 tablet (20 mg total) by mouth daily as needed. 12/31/22   Bradd Canary, MD  gabapentin (NEURONTIN) 600 MG tablet Take one po at night 11/18/22   Sater, Pearletha Furl, MD  lisinopril-hydrochlorothiazide (ZESTORETIC) 20-25 MG tablet Take 1 tablet by mouth once  daily 09/21/22   Bradd Canary, MD  methylphenidate (RITALIN) 20 MG tablet One po qAM and one po qNoon 11/18/22   Sater, Pearletha Furl, MD  Multiple Vitamin (MULTIVITAMIN) tablet Take 1 tablet by mouth daily.    [provider]  mupirocin ointment (BACTROBAN) 2 % Apply 1 Application topically 2 (two) times daily. 12/11/22   Saguier, Ramon Dredge, PA-C  MYRBETRIQ 50 MG TB24 tablet Take 50 mg by mouth daily. 11/11/22   [provider]  naproxen sodium (ALEVE) 220 MG tablet Take 220 mg by mouth daily as needed (pain).    [provider]  potassium chloride SA (KLOR-CON M) 20 MEQ tablet 1 tab po prn 2nd Furosemide dose in a day 12/31/22   Bradd Canary, MD  Probiotic Product (PROBIOTIC DAILY PO) Take by mouth.    [provider]  sertraline (ZOLOFT) 100 MG tablet TAKE 1 & 1/2 (ONE & ONE-HALF) TABLETS BY MOUTH ONCE DAILY 08/31/22   Bradd Canary, MD    Allergies:   Allergies  Allergen Reactions   Shellfish Allergy Hives, Shortness Of Breath, Itching, Swelling and Rash    Pt had previously carried an epi-pen and had a history of severe reaction to shrimp with breathing problems and swelling of lips and tongue     Social History:  reports that she has never smoked. She has never used smokeless tobacco. She reports that she does not currently use alcohol. She reports that she does not use drugs.  Family History: Family History  Problem Relation Age of Onset   Heart disease Mother        pacemaker   Emphysema Mother    Hypertension Mother    Heart disease Father    Diabetes Father    Heart disease Sister        cad   Sleep apnea Brother    Colon cancer Neg Hx    Esophageal cancer Neg Hx    Rectal cancer Neg Hx    Stomach cancer Neg Hx    Colon polyps Neg Hx     Physical Exam: Vitals:   01/24/23 2153 01/24/23 2200 01/24/23 2242  BP: (!) 171/96    Pulse: (!) 109    Resp: 17    Temp: (!) 100.7 F (38.2 C)  (!) 101.8 F (38.8 C)  TempSrc: Oral  Rectal   SpO2: 100%    Weight:  131.5 kg     General:  A&O x2, somnolent, ill patient Eyes: Pink conjunctiva, no scleral icterus ENT: Dry oral mucosa, neck supple, no thyromegaly Lungs: clear to ascultation, no wheeze, no crackles, no use of accessory muscles Cardiovascular: regular rate and rhythm, no regurgitation,  no gallops, no murmurs. No carotid bruits, no JVD Abdomen: soft, positive BS, non-tender, non-distended, no organomegaly, not an acute abdomen GU: not examined Neuro/Musculoskeletal: unable to assess d/t patients encephalopathy Skin: B/L LE w/ scars/wounds. Dry     RLE warm, erythematous spread almost to groin Psych: encephalopathic patient  Labs on Admission:  Recent Labs    01/24/23 2240  NA 134*  K 3.4*  CL 104  CO2 23  GLUCOSE 171*  BUN 22  CREATININE 1.19*  CALCIUM 9.3   Recent Labs    01/24/23 2240  AST 66*  ALT 33  ALKPHOS 67  BILITOT 1.4*  PROT 7.7  ALBUMIN 3.2*    Recent Labs    01/24/23 2240  WBC 16.2*  NEUTROABS 15.0*  HGB 10.9*  HCT 33.9*  MCV 90.4  PLT 206   Recent Labs    01/24/23 2240  CKTOTAL 1,652*    Micro Results: Recent Results (from the past 240 hour(s))  SARS Coronavirus 2 by RT PCR (hospital order, performed in [facility] hospital lab) *cepheid single result test* Anterior Nasal Swab     Status: None   Collection Time: 01/24/23 10:16 PM   Specimen: Anterior Nasal Swab  Result Value Ref Range Status   SARS Coronavirus 2 by RT PCR NEGATIVE NEGATIVE Final    Comment: (NOTE) SARS-CoV-2 target nucleic acids are NOT DETECTED.  The SARS-CoV-2 RNA is generally detectable in upper and lower respiratory specimens during the acute phase of infection. The lowest concentration of SARS-CoV-2 viral copies this assay can detect is 250 copies / mL. A negative result does not preclude SARS-CoV-2 infection and should not be used as the sole basis for treatment or other patient management decisions.  A negative result may occur  with improper specimen collection / handling, submission of specimen other than nasopharyngeal swab, presence of viral mutation(s) within the areas targeted by this assay, and inadequate number of viral copies (<250 copies / mL). A negative result must be combined with clinical observations, patient history, and epidemiological information.  Fact Sheet for Patients:   RoadLapTop.co.za  Fact Sheet for Healthcare Providers: http://kim-miller.com/  This test is not yet approved or  cleared by the [place] FDA and has been authorized for detection and/or diagnosis of SARS-CoV-2 by FDA under an Emergency Use Authorization (EUA).  This EUA will remain in effect (meaning this test can be used) for the duration of the COVID-19 declaration under Section 564(b)(1) of the Act, 21 U.S.C. section 360bbb-3(b)(1), unless the authorization is terminated or revoked sooner.  Performed at [facility], [address], [place], [place] [address]      Radiological Exams on Admission: CT Head Wo Contrast  Result Date: 01/24/2023 CLINICAL DATA:  Delirium, last known normal on Monday EXAM: CT HEAD WITHOUT CONTRAST TECHNIQUE: Contiguous axial images were obtained from the base of the skull through the vertex without intravenous contrast. RADIATION DOSE REDUCTION: This exam was performed according to the departmental dose-optimization program which includes automated exposure control, adjustment of the mA and/or kV according to patient size and/or use of iterative reconstruction technique. COMPARISON:  No prior CT head available, correlation is made with MRI brain 02/28/2017 FINDINGS: Brain: No evidence of acute infarction, hemorrhage, mass, mass effect, or midline shift. No hydrocephalus or extra-axial fluid collection. Periventricular white matter changes, likely the sequela of chronic small vessel ischemic disease. Partial empty sella, with  expansion of the sella, similar to the prior MRI. Normal craniocervical junction Vascular: No hyperdense  vessel. Skull: Negative for fracture or focal lesion. Hyperostosis frontalis Sinuses/Orbits: Clear imaged paranasal sinuses. Tortuous optic nerves. Other: The mastoid air cells are well aerated. IMPRESSION: 1. No acute intracranial process. 2. Partial empty sella with expansion of the sella, similar to the prior MRI, with tortuous optic nerves, which can be seen in the setting of idiopathic intracranial hypertension. Electronically Signed   By: Wiliam Ke M.D.   On: 01/24/2023 23:23   DG Femur Portable 1 View Right  Result Date: 01/24/2023 CLINICAL DATA:  Lower extremity cellulitis EXAM: RIGHT FEMUR PORTABLE 1 VIEW COMPARISON:  None Available. FINDINGS: There is no evidence of fracture or other focal bone lesions. Soft tissues are unremarkable. IMPRESSION: No acute abnormality noted. Electronically Signed   By: Alcide Clever M.D.   On: 01/24/2023 23:14   DG Tibia/Fibula Right Port  Result Date: 01/24/2023 CLINICAL DATA:  Lower leg cellulitis EXAM: PORTABLE RIGHT TIBIA AND FIBULA - 2 VIEW COMPARISON:  12/22/2022 FINDINGS: Mild soft tissue edema is noted. No focal wound is seen. Underlying bony structures show no acute fracture or dislocation. Degenerative changes are noted in the knee joint worst in the lateral joint space similar to that seen on the prior exam. IMPRESSION: Degenerative change with soft tissue edema. No acute abnormality is noted. Electronically Signed   By: Alcide Clever M.D.   On: 01/24/2023 23:13   DG Chest 2 View  Result Date: 01/24/2023 CLINICAL DATA:  Sepsis. EXAM: CHEST - 2 VIEW COMPARISON:  Radiograph dated 07/13/2005. FINDINGS: No focal consolidation, pleural effusion, or pneumothorax. Enlargement of the right paratracheal soft tissue may be projectional and related to upper mediastinal vasculature. Right paratracheal adenopathy or mass is not excluded. Further evaluation  with CT is recommended. The cardiac silhouette is within normal limits. No acute osseous pathology. IMPRESSION: 1. No focal consolidation. 2. Right paratracheal soft tissue enlargement. Further evaluation with CT is recommended. Electronically Signed   By: Elgie Collard M.D.   On: 01/24/2023 23:08    Assessment/Plan Present on Admission:  Extensive Cellulitis RLE //  Severe sepsis //  B/L LE ulcerations -Patient did become hypotensive. Midodrine resumed -Sepsis evidenced by altered mentation, lactic acidosis, febrile state -Blood cultures ordered.  MRSA nasal swabs ordered -Continue Zosyn and Flagyl -CT RLE r/o abscess -IV fluid hydration -Wound care consult placed for ulcerations.  Patient with some skin tears on buttock -Tylenol as needed -Follow lactic acid curve until normalized   Acute metabolic encephalopathy -Due to above infection.  Treating underlying cause   Rhabdomyolysis, mild -IV fluids.  CK in a.m.   Right paratracheal soft tissue enlargement -CT chest with thyromegaly.  Previously worked up.     Multiple sclerosis   -CT head negative.  If patient with poor response antibiotics, consider MRI brain, cervical spine to evaluate for MS flare   Orthostatic hypotension -Patient did become hypotensive. Midodrine resumed   Attention deficit disorder (ADD) in adult  Hyperlipidemia, mixed -All PO meds on hold  Sydney Pace 01/25/2023, 12:24 AM

## 2023-01-26 DIAGNOSIS — L03115 Cellulitis of right lower limb: Secondary | ICD-10-CM | POA: Diagnosis not present

## 2023-01-26 LAB — BLOOD CULTURE ID PANEL (REFLEXED) - BCID2

## 2023-01-26 LAB — GLUCOSE, CAPILLARY: Glucose-Capillary: 90 mg/dL (ref 70–99)

## 2023-01-26 NOTE — Progress Notes (Signed)
PROGRESS NOTE  Atlean Lauersdorf  DOB: May 26, 1956  PCP: Bradd Canary, MD NFA:[id number]  DOA: 01/24/2023  LOS: 1 day  Hospital Day: 3  Brief narrative: Sydney Pace is a 67 y.o. female with PMH significant for morbid obesity, multiple sclerosis, HTN, GERD, hypothyroidism, depression, constipation who lives alone. 8/25, patient family went to check on her as they had not been able to contact her lately.  They found her confused, sitting on the toilet and brought to the ED  In the ED, patient had a fever of 100.7, blood pressure 171/76, breathing room air She was somnolent, disoriented She was noted to have extensive erythema and swelling of right lower extremity up to the groin.  Ulcerative skin on both lower extremity with drainage. Labs showed WBC count elevated to 16.2, lactic acid elevated to 2.2, CK level 1652 Respiratory virus panel unremarkable Urinalysis showed hazy yellow urine with large amount of hemoglobin, no bacteria nitrates or leukocytes Blood culture was sent CT head unremarkable for acute findings CT right lower extremity showed subcutaneous edema in the distal thigh and lower leg consistent with cellulitis.  No evidence of focal collection, foreign body or emphysema or osteomyelitis. Started on IV antibiotics, IV fluid Admitted to [facility] Wound care consulted  Subjective: Patient was seen and examined this morning.   More awake today more conversational.  Allowed to work with physical therapy Right lower extremity remains warm and red.  Assessment and plan: Severe sepsis POA Extensive Cellulitis RLE  B/L LE ulcerations Met severe sepsis criteria with fever, leukocytosis, lactic acidosis, altered mental status, source of infection Clinical and radiographic evidence of cellulitis without abscess Cultures sent Currently on bolus for IV antibiotics Wound care consulted WBC count improving.  Lactic acid normalized. Continue to monitor on antibiotics. Recent Labs   Lab 01/24/23 2240 01/24/23 2253 01/25/23 0343  WBC 16.2*  --  14.2*  LATICACIDVEN  --  2.2* 1.3   Acute metabolic encephalopathy Due to above infection.   Mental status gradually improving.  Continue monitor mental status change   Mild rhabdomyolysis Due to being in the same position for long time. CK level elevated to 1600s.  Adequately hydrated.  At risk of volume overload. Repeat CK level tomorrow. Recent Labs  Lab 01/24/23 2240  CKTOTAL 1,652*    Multiple sclerosis   CT head negative for new lesions. If patient with poor response to antibiotics, consider MRI brain, cervical spine to evaluate for MS flare   History of hypertension Orthostatic hypotension Per admission H&P, patient was hypotensive.  Blood pressure in 130s this morning.  Currently not on midodrine. It seems patient was on lisinopril, HCTZ at home.  Currently on hold.  Goiter CT chest showed diffuse enlarged thyroid slightly larger than last seen in 2010.  No underlying adenopathy.  Radiologist also reported that thyroid has been previously evaluated with ultrasound and biopsy   ADHD Depression PTA meds-Abilify, Zoloft, bupropion, Neurontin, Ritalin  Pulmonary nodules Per CT chest, patient had stable left upper lobe nodule from 20/10. No further follow-up is recommended.   Mobility: Encourage ambulation.  PT eval ordered.  Goals of care   Code Status: Full Code     DVT prophylaxis:  enoxaparin (LOVENOX) injection 40 mg Start: 01/25/23 1000   Antimicrobials: IV Zosyn Fluid: None Consultants: None Family Communication: None at bedside  Status: Inpatient Level of care:  Telemetry   Patient is from: Home Needs to continue in-hospital care: Needs IV antibiotics for 1-2 more days.  Pending PT  eval Anticipated d/c to: Pending clinical course    Diet:  Diet Order             Diet regular Room service appropriate? Yes; Fluid consistency: Thin  Diet effective now                    Scheduled Meds:  enoxaparin (LOVENOX) injection  40 mg Subcutaneous Q24H    PRN meds: acetaminophen **OR** acetaminophen, ondansetron **OR** ondansetron (ZOFRAN) IV, polyvinyl alcohol, senna-docusate   Infusions:   piperacillin-tazobactam (ZOSYN)  IV 3.375 g (01/26/23 0558)    Antimicrobials: Anti-infectives (From admission, onward)    Start     Dose/Rate Route Frequency Ordered Stop   01/25/23 0600  piperacillin-tazobactam (ZOSYN) IVPB 3.375 g        3.375 g 12.5 mL/hr over 240 Minutes Intravenous Every 8 hours 01/25/23 0025     01/24/23 2230  vancomycin (VANCOREADY) IVPB 2000 mg/400 mL        2,000 mg 200 mL/hr over 120 Minutes Intravenous  Once 01/24/23 2216 01/25/23 0200   01/24/23 2215  ceFEPIme (MAXIPIME) 2 g in sodium chloride 0.9 % 100 mL IVPB        2 g 200 mL/hr over 30 Minutes Intravenous  Once 01/24/23 2210 01/25/23 0104   01/24/23 2215  metroNIDAZOLE (FLAGYL) IVPB 500 mg        500 mg 100 mL/hr over 60 Minutes Intravenous  Once 01/24/23 2210 01/25/23 0104   01/24/23 2215  vancomycin (VANCOCIN) IVPB 1000 mg/200 mL premix  Status:  Discontinued        1,000 mg 200 mL/hr over 60 Minutes Intravenous  Once 01/24/23 2210 01/24/23 2216       Nutritional status:  Body mass index is 45.41 kg/m.          Objective: Vitals:   01/26/23 0450 01/26/23 0950  BP: 127/64 133/69  Pulse: 82 81  Resp: 19 17  Temp: 99.3 F (37.4 C) 100.3 F (37.9 C)  SpO2: 99% 99%    Intake/Output Summary (Last 24 hours) at 01/26/2023 1134 Last data filed at 01/26/2023 1100 Gross per 24 hour  Intake 1305.98 ml  Output 550 ml  Net 755.98 ml   Filed Weights   01/24/23 2200  Weight: 131.5 kg   Weight change:  Body mass index is 45.41 kg/m.   Physical Exam: General exam: Pleasant elderly African-American female.  Not in physical distress Skin: No rashes, lesions or ulcers. HEENT: Atraumatic, normocephalic, no obvious bleeding Lungs: Clear to auscultation  bilaterally CVS: Regular rate and rhythm, no murmur GI/Abd soft nontender, nondistended, bowel sound present CNS: Alert, awake, slow to respond but oriented x 3. Psychiatry: Mood-. Extremities: Mild pedal edema on the left.  Right lower extremities swollen, red and tender, gradually improving  Data Review: I have personally reviewed the laboratory data and studies available.  F/u labs ordered Unresulted Labs (From admission, onward)     Start     Ordered   02/01/23 0500  Creatinine, serum  (enoxaparin (LOVENOX)    CrCl >/= 30 ml/min)  Weekly,   R     Comments: while on enoxaparin therapy    01/25/23 0017   01/27/23 0500  CBC with Differential/Platelet  Daily,   R      01/26/23 1057   01/27/23 0500  Basic metabolic panel  Daily,   R      01/26/23 1057   01/27/23 0500  CK  Tomorrow morning,   R  01/26/23 1057            Total time spent in review of labs and imaging, patient evaluation, formulation of plan, documentation and communication with family: 45 minutes  Signed, Lorin Glass, MD [facility] 01/26/2023

## 2023-01-26 NOTE — Evaluation (Signed)
Physical Therapy Evaluation Patient Details Name: Sydney Pace MRN: [id number] DOB: 11-03-1955 Today's Date: 01/26/2023  History of Present Illness  67 yo female admitted with AMS, R LE cellulitis, severe sepsis. Hx of bil chronic LE wounds-goes to wound center, morbid obesity, multiple sclerosis, orthostatic hypotension, depression  Clinical Impression  On eval, pt required Mod A +2 for bed mobility. Pt presents with general weakness, decreased activity tolerance, and impaired gait and balance. Pt reported moderate pain with activity. She still has some mild confusion. She required multimodal cueing and encouragement for participation. She is weak and at risk for falls when mobilizing. Patient will benefit from continued inpatient follow up therapy, <3 hours/day         If plan is discharge home, recommend the following: Direct supervision/assist for financial management;Assist for transportation;Help with stairs or ramp for entrance;Two people to help with walking and/or transfers;Two people to help with bathing/dressing/bathroom   Can travel by private vehicle   No    Equipment Recommendations None recommended by PT  Recommendations for Other Services  OT consult    Functional Status Assessment Patient has had a recent decline in their functional status and demonstrates the ability to make significant improvements in function in a reasonable and predictable amount of time.     Precautions / Restrictions Precautions Precautions: Fall Precaution Comments: bil LE wounds Restrictions Weight Bearing Restrictions: No      Mobility  Bed Mobility Overal bed mobility: Needs Assistance Bed Mobility: Supine to Sit, Sit to Supine     Supine to sit: Mod assist, +2 for safety/equipment, HOB elevated, Used rails Sit to supine: Mod assist, +2 for safety/equipment, HOB elevated, Used rails   General bed mobility comments: Cues for safety, technique, sequencing, and for pt to do as much  for herself as she can. Increased time. Multimodal cueing. Increased time. Sat EOB for ~ 5 mintues with Min A-intermittent LOB posteriorly. Pt reported some dizziness. Fatigued easily. Weak.    Transfers                   General transfer comment: NT-too weak, fearful, high fall risk    Ambulation/Gait                  Stairs            Wheelchair Mobility     Tilt Bed    Modified Rankin (Stroke Patients Only)       Balance Overall balance assessment: Needs assistance Sitting-balance support: Bilateral upper extremity supported, Feet supported Sitting balance-Leahy Scale: Poor                                       Pertinent Vitals/Pain Pain Assessment Pain Assessment: 0-10 Pain Score: 6  Pain Location: bil LEs Pain Intervention(s): Limited activity within patient's tolerance, Monitored during session, Repositioned    Home Living Family/patient expects to be discharged to:: Private residence Living Arrangements: Alone   Type of Home: House Home Access: Stairs to enter       Home Layout: One level Home Equipment: Agricultural consultant (2 wheels);Cane - single point      Prior Function Prior Level of Function : Independent/Modified Independent             Mobility Comments: uses walker ADLs Comments: mod ind with ADLs     Extremity/Trunk Assessment   Upper Extremity Assessment Upper Extremity Assessment:  Defer to OT evaluation    Lower Extremity Assessment Lower Extremity Assessment: Generalized weakness (R LE redness, tenderness. bil lower leg wounds)    Cervical / Trunk Assessment Cervical / Trunk Assessment: Normal  Communication   Communication Communication: No apparent difficulties Cueing Techniques: Verbal cues  Cognition Arousal: Alert Behavior During Therapy: WFL for tasks assessed/performed Overall Cognitive Status: No family/caregiver present to determine baseline cognitive functioning                                  General Comments: mostly WFL-some memory deficits with history-taking, required cues        General Comments      Exercises     Assessment/Plan    PT Assessment Patient needs continued PT services  PT Problem List Decreased strength;Decreased range of motion;Decreased activity tolerance;Decreased balance;Decreased mobility;Pain;Decreased cognition;Decreased safety awareness;Decreased knowledge of use of DME;Obesity       PT Treatment Interventions DME instruction;Gait training;Functional mobility training;Therapeutic activities;Therapeutic exercise;Patient/family education;Balance training    PT Goals (Current goals can be found in the Care Plan section)  Acute Rehab PT Goals Patient Stated Goal: get stronger. less pain PT Goal Formulation: With patient Time For Goal Achievement: 02/09/23 Potential to Achieve Goals: Good    Frequency Min 1X/week     Co-evaluation               AM-PAC PT "6 Clicks" Mobility  Outcome Measure Help needed turning from your back to your side while in a flat bed without using bedrails?: A Lot Help needed moving from lying on your back to sitting on the side of a flat bed without using bedrails?: A Lot Help needed moving to and from a bed to a chair (including a wheelchair)?: Total Help needed standing up from a chair using your arms (e.g., wheelchair or bedside chair)?: Total Help needed to walk in hospital room?: Total Help needed climbing 3-5 steps with a railing? : Total 6 Click Score: 8    End of Session   Activity Tolerance: Patient limited by fatigue;Patient limited by pain Patient left: in bed;with call bell/phone within reach;with bed alarm set   PT Visit Diagnosis: Muscle weakness (generalized) (M62.81);Difficulty in walking, not elsewhere classified (R26.2);Pain Pain - part of body: Leg    Time: 3875-6433 PT Time Calculation (min) (ACUTE ONLY): 34 min   Charges:   PT Evaluation $PT Eval  Low Complexity: 1 Low PT Treatments $Therapeutic Activity: 8-22 mins PT General Charges $$ ACUTE PT VISIT: 1 Visit            Faye Ramsay, PT Acute Rehabilitation  Office: [phone]

## 2023-01-26 NOTE — Progress Notes (Signed)
PHARMACY - PHYSICIAN COMMUNICATION CRITICAL VALUE ALERT - BLOOD CULTURE IDENTIFICATION (BCID)  Sydney Pace is an 67 y.o. female who presented to [facility] on 01/24/2023 with a chief complaint of altered mental status  Assessment:  She was admitted with sepsis- presumed cause of RLE cellulitis or BLE ulcerations. She is currently afebrile & hemodynamically stable.   Blood cx growing GPC in aerobic bottle of 1 set and GNR in anaerobic & aerobic bottle of same set;  BCID is +methicillin resistance S. Epi.  GNR not identified on BCID.   S. Epi could be contaminant.  Name of physician (or Provider) Contacted: Johann Capers  Current antibiotics: Zosyn  Changes to prescribed antibiotics recommended:  Patient is on recommended antibiotics - No changes needed Low threshold to add Vancomycin if patient clinically worsens F/U cx data  Results for orders placed or performed during the hospital encounter of 01/24/23  Blood Culture ID Panel (Reflexed) (Collected: 01/24/2023 10:40 PM)  Result Value Ref Range   Enterococcus faecalis NOT DETECTED NOT DETECTED   Enterococcus Faecium NOT DETECTED NOT DETECTED   Listeria monocytogenes NOT DETECTED NOT DETECTED   Staphylococcus species DETECTED (A) NOT DETECTED   Staphylococcus aureus (BCID) NOT DETECTED NOT DETECTED   Staphylococcus epidermidis DETECTED (A) NOT DETECTED   Staphylococcus lugdunensis NOT DETECTED NOT DETECTED   Streptococcus species NOT DETECTED NOT DETECTED   Streptococcus agalactiae NOT DETECTED NOT DETECTED   Streptococcus pneumoniae NOT DETECTED NOT DETECTED   Streptococcus pyogenes NOT DETECTED NOT DETECTED   A.calcoaceticus-baumannii NOT DETECTED NOT DETECTED   Bacteroides fragilis NOT DETECTED NOT DETECTED   Enterobacterales NOT DETECTED NOT DETECTED   Enterobacter cloacae complex NOT DETECTED NOT DETECTED   Escherichia coli NOT DETECTED NOT DETECTED   Klebsiella aerogenes NOT DETECTED NOT DETECTED   Klebsiella oxytoca NOT  DETECTED NOT DETECTED   Klebsiella pneumoniae NOT DETECTED NOT DETECTED   Proteus species NOT DETECTED NOT DETECTED   Salmonella species NOT DETECTED NOT DETECTED   Serratia marcescens NOT DETECTED NOT DETECTED   Haemophilus influenzae NOT DETECTED NOT DETECTED   Neisseria meningitidis NOT DETECTED NOT DETECTED   Pseudomonas aeruginosa NOT DETECTED NOT DETECTED   Stenotrophomonas maltophilia NOT DETECTED NOT DETECTED   Candida albicans NOT DETECTED NOT DETECTED   Candida auris NOT DETECTED NOT DETECTED   Candida glabrata NOT DETECTED NOT DETECTED   Candida krusei NOT DETECTED NOT DETECTED   Candida parapsilosis NOT DETECTED NOT DETECTED   Candida tropicalis NOT DETECTED NOT DETECTED   Cryptococcus neoformans/gattii NOT DETECTED NOT DETECTED   Methicillin resistance mecA/C DETECTED (A) NOT DETECTED    Junita Push PharmD 01/26/2023  12:21 AM

## 2023-01-26 NOTE — Plan of Care (Signed)
  Problem: Clinical Measurements: Goal: Ability to maintain clinical measurements within normal limits will improve Outcome: Progressing Goal: Diagnostic test results will improve Outcome: Progressing Goal: Respiratory complications will improve Outcome: Progressing Goal: Cardiovascular complication will be avoided Outcome: Progressing   Problem: Elimination: Goal: Will not experience complications related to bowel motility Outcome: Progressing   Problem: Safety: Goal: Ability to remain free from injury will improve Outcome: Progressing

## 2023-01-26 NOTE — TOC Initial Note (Signed)
Transition of Care [facility]) - Initial/Assessment Note    Patient Details  Name: Sydney Pace MRN: [id number] Date of Birth: 1956-02-23  Transition of Care [facility]) CM/SW Contact:    Larrie Kass, LCSW Phone Number: 01/26/2023, 4:32 PM  Clinical Narrative:                 CSW met with pt to discuss, short-term rehab recommendations. Pt is from home and lives alone. She reports using a walker at baseline. Pt has agreed to rehab placement. CSW explained the process, pt will need insurance authorization. CSW to work pt up for SNF. TOC to follow.   Expected Discharge Plan: Skilled Nursing Facility Barriers to Discharge: Continued Medical Work up   Patient Goals and CMS Choice Patient states their goals for this hospitalization and ongoing recovery are:: short term rehab CMS Medicare.gov Compare Post Acute Care list provided to:: Patient Choice offered to / list presented to : Patient      Expected Discharge Plan and Services In-house Referral: Clinical Social Work     Living arrangements for the past 2 months: Single Family Home                                      Prior Living Arrangements/Services Living arrangements for the past 2 months: Single Family Home Lives with:: Self Patient language and need for interpreter reviewed:: Yes Do you feel safe going back to the place where you live?: Yes      Need for Family Participation in Patient Care: No (Comment)   Current home services: DME Criminal Activity/Legal Involvement Pertinent to Current Situation/Hospitalization: No - Comment as needed  Activities of Daily Living Home Assistive Devices/Equipment: Walker (specify type) ADL Screening (condition at time of admission) Patient's cognitive ability adequate to safely complete daily activities?: No Is the patient deaf or have difficulty hearing?: No Does the patient have difficulty seeing, even when wearing glasses/contacts?: No Does the patient have difficulty  concentrating, remembering, or making decisions?: Yes Patient able to express need for assistance with ADLs?: No Does the patient have difficulty dressing or bathing?: Yes Independently performs ADLs?: No Communication: Independent Dressing (OT): Needs assistance Is this a change from baseline?: Change from baseline, expected to last >3 days Grooming: Needs assistance Is this a change from baseline?: Change from baseline, expected to last >3 days Feeding: Independent Bathing: Needs assistance Is this a change from baseline?: Change from baseline, expected to last >3 days Toileting: Needs assistance Is this a change from baseline?: Change from baseline, expected to last >3days In/Out Bed: Needs assistance Is this a change from baseline?: Change from baseline, expected to last >3 days Walks in Home: Needs assistance Is this a change from baseline?: Change from baseline, expected to last >3 days Does the patient have difficulty walking or climbing stairs?: Yes Weakness of Legs: Both Weakness of Arms/Hands: Both  Permission Sought/Granted                  Emotional Assessment Appearance:: Appears stated age Attitude/Demeanor/Rapport: Gracious Affect (typically observed): Accepting Orientation: : Oriented to Self, Oriented to Place, Oriented to  Time, Oriented to Situation      Admission diagnosis:  Cellulitis [L03.90] Sepsis, due to unspecified organism, unspecified whether acute organ dysfunction present [facility]) [A41.9] Patient Active Problem List   Diagnosis Date Noted   Cellulitis 01/25/2023   Wound infection 12/23/2022   Vitamin D  deficiency 12/23/2022   Right leg pain 09/15/2022   Polyp of colon 07/14/2021   Urinary incontinence 01/30/2021   Restless leg syndrome 08/03/2019   Attention deficit disorder (ADD) in adult 08/03/2019   Weakness of both lower extremities 07/26/2019   Left shoulder pain 07/26/2019   Hyperglycemia 07/25/2019   Shingles 10/09/2018   Lumbar  radiculopathy 08/02/2018   Numbness 08/02/2018   Post-menopausal bleeding 03/17/2018   Dysuria 03/17/2018   High serum parathyroid hormone (PTH) 01/10/2018   Hypercalcemia 01/10/2018   Chronic heel pain, left 07/06/2017   Arthritis 07/06/2017   Osteopenia 07/06/2017   Hx of colonic polyp 07/06/2017   Cervical cancer screening 07/06/2017   Obesity 07/06/2017   Dry eyes, bilateral 10/10/2015   Low-tension glaucoma of both eyes, moderate stage 10/10/2015   Nuclear sclerotic cataract of both eyes 10/10/2015   Partial optic atrophy of left eye 10/10/2015   Insomnia 08/16/2014   Optic neuritis 08/16/2014   Ataxic gait 08/16/2014   Other fatigue 08/16/2014   Abdominal pain 04/03/2014   Colon cancer screening 04/03/2014   Helicobacter pylori (H. pylori) infection 03/21/2014   Preventative health care 08/27/2013   Sinusitis 08/08/2012   Hyperlipidemia, mixed 08/08/2012   Allergy 08/08/2012   Esophageal reflux 08/08/2012   Depression 01/23/2011   Leg swelling 01/23/2011   Multiple sclerosis (HCC) 09/23/2010   Essential hypertension 09/23/2010   Overactive bladder 09/23/2010   Thyroid nodule 09/23/2010   Pulmonary nodule 09/23/2010   Fatigue 09/23/2010   PCP:  Bradd Canary, MD Pharmacy:   [facility] [address] [place] - [address] [place] [address] Phone: [phone] Fax: [phone]  [facility] - [place], [place] - [address] [place] [address] Phone: [phone] Fax: [phone]     Social Determinants of Health (SDOH) Social History: SDOH Screenings   Food Insecurity: Food Insecurity Present (12/10/2022)  Housing: Patient Unable To Answer (12/10/2022)  Transportation Needs: No Transportation Needs (12/10/2022)  Utilities: Not At Risk (12/10/2022)  Alcohol Screen: Low Risk  (12/10/2022)  Depression (PHQ2-9): Low Risk  (12/31/2022)  Recent Concern: Depression (PHQ2-9)  - Medium Risk (12/10/2022)  Financial Resource Strain: Low Risk  (11/10/2021)  Physical Activity: Inactive (12/10/2022)  Social Connections: Socially Isolated (11/10/2021)  Stress: No Stress Concern Present (11/10/2021)  Tobacco Use: Low Risk  (01/25/2023)   SDOH Interventions:     Readmission Risk Interventions     No data to display

## 2023-01-27 ENCOUNTER — Ambulatory Visit (HOSPITAL_BASED_OUTPATIENT_CLINIC_OR_DEPARTMENT_OTHER): Payer: Medicare (Managed Care) | Admitting: General Surgery

## 2023-01-27 DIAGNOSIS — L03115 Cellulitis of right lower limb: Secondary | ICD-10-CM | POA: Diagnosis not present

## 2023-01-27 LAB — CBC WITH DIFFERENTIAL/PLATELET
Abs Immature Granulocytes: 0.17 10*3/uL — ABNORMAL HIGH (ref 0.00–0.07)
Basophils Absolute: 0.1 10*3/uL (ref 0.0–0.1)
Basophils Relative: 0 %
Eosinophils Absolute: 0.1 10*3/uL (ref 0.0–0.5)
Eosinophils Relative: 1 %
HCT: 31 % — ABNORMAL LOW (ref 36.0–46.0)
Hemoglobin: 9.9 g/dL — ABNORMAL LOW (ref 12.0–15.0)
Immature Granulocytes: 1 %
Lymphocytes Relative: 7 %
Lymphs Abs: 1.1 10*3/uL (ref 0.7–4.0)
MCH: 29 pg (ref 26.0–34.0)
MCHC: 31.9 g/dL (ref 30.0–36.0)
MCV: 90.9 fL (ref 80.0–100.0)
Monocytes Absolute: 0.5 10*3/uL (ref 0.1–1.0)
Monocytes Relative: 3 %
Neutro Abs: 13.7 10*3/uL — ABNORMAL HIGH (ref 1.7–7.7)
Neutrophils Relative %: 88 %
Platelets: 200 10*3/uL (ref 150–400)
RBC: 3.41 MIL/uL — ABNORMAL LOW (ref 3.87–5.11)
RDW: 15.9 % — ABNORMAL HIGH (ref 11.5–15.5)
WBC: 15.7 10*3/uL — ABNORMAL HIGH (ref 4.0–10.5)
nRBC: 0.1 % (ref 0.0–0.2)

## 2023-01-27 LAB — BASIC METABOLIC PANEL
Anion gap: 12 (ref 5–15)
BUN: 18 mg/dL (ref 8–23)
CO2: 20 mmol/L — ABNORMAL LOW (ref 22–32)
Calcium: 9 mg/dL (ref 8.9–10.3)
Chloride: 104 mmol/L (ref 98–111)
Creatinine, Ser: 1.03 mg/dL — ABNORMAL HIGH (ref 0.44–1.00)
GFR, Estimated: 59 mL/min — ABNORMAL LOW (ref >60)
Glucose, Bld: 114 mg/dL — ABNORMAL HIGH (ref 70–99)
Potassium: 3.1 mmol/L — ABNORMAL LOW (ref 3.5–5.1)
Sodium: 136 mmol/L (ref 135–145)

## 2023-01-27 LAB — CK: Total CK: 281 U/L — ABNORMAL HIGH (ref 38–234)

## 2023-01-27 MED ORDER — POTASSIUM CHLORIDE CRYS ER 20 MEQ PO TBCR
40.0000 meq | EXTENDED_RELEASE_TABLET | Freq: Once | ORAL | Status: AC
  Administered 2023-01-27: 40 meq via ORAL
  Filled 2023-01-27: qty 2

## 2023-01-27 MED ORDER — FUROSEMIDE 20 MG PO TABS
20.0000 mg | ORAL_TABLET | Freq: Every day | ORAL | Status: DC
  Administered 2023-01-27 – 2023-02-03 (×8): 20 mg via ORAL
  Filled 2023-01-27 (×8): qty 1

## 2023-01-27 MED ORDER — LORATADINE 10 MG PO TABS
10.0000 mg | ORAL_TABLET | Freq: Every day | ORAL | Status: DC
  Administered 2023-01-27 – 2023-02-03 (×8): 10 mg via ORAL
  Filled 2023-01-27 (×8): qty 1

## 2023-01-27 NOTE — Progress Notes (Signed)
Pt has an order for mattress replacement. Pt informed about changing from regular to low air mattress, pt educated, however she decided to stay in her own bed and refused mattress to be changed.

## 2023-01-27 NOTE — Progress Notes (Signed)
PROGRESS NOTE  Sydney Pace  DOB: May 21, 1956  PCP: Bradd Canary, MD ZOX:[id number]  DOA: 01/24/2023  LOS: 2 days  Hospital Day: 4  Brief narrative: Sydney Pace is a 67 y.o. female with PMH significant for morbid obesity, multiple sclerosis, HTN, GERD, hypothyroidism, depression, constipation who lives alone. 8/25, patient family went to check on her as they had not been able to contact her lately.  They found her confused, sitting on the toilet and brought to the ED  In the ED, patient had a fever of 100.7, blood pressure 171/76, breathing room air She was somnolent, disoriented She was noted to have extensive erythema and swelling of right lower extremity up to the groin.  Ulcerative skin on both lower extremity with drainage. Labs showed WBC count elevated to 16.2, lactic acid elevated to 2.2, CK level 1652 Respiratory virus panel unremarkable Urinalysis showed hazy yellow urine with large amount of hemoglobin, no bacteria nitrates or leukocytes Blood culture was sent CT head unremarkable for acute findings CT right lower extremity showed subcutaneous edema in the distal thigh and lower leg consistent with cellulitis.  No evidence of focal collection, foreign body or emphysema or osteomyelitis. Started on IV antibiotics, IV fluid Admitted to [facility] Wound care consulted  Subjective: Patient was seen and examined this morning.   Lying on bed.  Was working with occupational therapy.  Alert, awake, oriented and able to answer questions but very weak and needed much assistance to be repositioned in the bed. Patient had episodes of fever up to 103 last night.  Last fever episode was 100.5 at midnight.  Assessment and plan: Severe sepsis POA Extensive Cellulitis RLE  B/L LE ulcerations Met severe sepsis criteria with fever, leukocytosis, lactic acidosis, altered mental status, source of infection Clinical and radiographic evidence of cellulitis without abscess Blood culture sent  and obtained did not show any growth. Patient is currently on IV Zosyn.  She had breakthrough fever of 103 yesterday and had 2 more episodes overnight.  WBC count slightly up today. She continues to have right lower extremity swelling, redness which clinically does not seem to be worsening. For now we will continue IV Zosyn and continue to monitor. Recent Labs  Lab 01/24/23 2240 01/24/23 2253 01/25/23 0343 01/27/23 0416  WBC 16.2*  --  14.2* 15.7*  LATICACIDVEN  --  2.2* 1.3  --    Acute metabolic encephalopathy Due to above infection.   Mental status gradually improving.  She was delirious with high fever yesterday.   Mental status this morning is intact.  Continue monitor mental status change   Mild rhabdomyolysis Due to being in the same position for long time. CK level elevated to 1600s.  Adequately hydrated.   Repeat CK level today showed an improvement to 281. Recent Labs  Lab 01/24/23 2240 01/27/23 0416  CKTOTAL 1,652* 281*    Multiple sclerosis   CT head negative for new lesions. If patient with poor response to antibiotics, consider MRI brain, cervical spine to evaluate for MS flare   Essential hypertension Previously on lisinopril and HCTZ which are currently on hold.  She was also on Lasix 20 mg daily as needed. She required IV fluid without abnormalities.  Looks puffy.  I will start Lasix 20 mg today. Continue to monitor blood pressure  Goiter CT chest showed diffuse enlarged thyroid slightly larger than last seen in 2010.  No underlying adenopathy.  Radiologist also reported that thyroid has been previously evaluated with ultrasound and biopsy  ADHD Depression PTA meds-Abilify, Zoloft, bupropion, Neurontin, Ritalin  Pulmonary nodules Per CT chest, patient had stable left upper lobe nodule from 2010. No further follow-up is recommended.  Generalized weakness Impaired mobility PT eval obtained.  SNF recommended.  Goals of care   Code Status: Full Code      DVT prophylaxis:  enoxaparin (LOVENOX) injection 40 mg Start: 01/25/23 1000   Antimicrobials: IV Zosyn Fluid: None Consultants: None Family Communication: None at bedside  Status: Inpatient Level of care:  Telemetry   Patient is from: Home Needs to continue in-hospital care: Needs IV antibiotics for 1-2 more days.  Hopefully no fever for next 24 hours Anticipated d/c to: Pending clinical course    Diet:  Diet Order             Diet regular Room service appropriate? Yes; Fluid consistency: Thin  Diet effective now                   Scheduled Meds:  enoxaparin (LOVENOX) injection  40 mg Subcutaneous Q24H   loratadine  10 mg Oral Daily   potassium chloride  40 mEq Oral Once    PRN meds: acetaminophen **OR** acetaminophen, ondansetron **OR** ondansetron (ZOFRAN) IV, polyvinyl alcohol, senna-docusate   Infusions:   piperacillin-tazobactam (ZOSYN)  IV 3.375 g (01/27/23 0535)    Antimicrobials: Anti-infectives (From admission, onward)    Start     Dose/Rate Route Frequency Ordered Stop   01/25/23 0600  piperacillin-tazobactam (ZOSYN) IVPB 3.375 g        3.375 g 12.5 mL/hr over 240 Minutes Intravenous Every 8 hours 01/25/23 0025     01/24/23 2230  vancomycin (VANCOREADY) IVPB 2000 mg/400 mL        2,000 mg 200 mL/hr over 120 Minutes Intravenous  Once 01/24/23 2216 01/25/23 0200   01/24/23 2215  ceFEPIme (MAXIPIME) 2 g in sodium chloride 0.9 % 100 mL IVPB        2 g 200 mL/hr over 30 Minutes Intravenous  Once 01/24/23 2210 01/25/23 0104   01/24/23 2215  metroNIDAZOLE (FLAGYL) IVPB 500 mg        500 mg 100 mL/hr over 60 Minutes Intravenous  Once 01/24/23 2210 01/25/23 0104   01/24/23 2215  vancomycin (VANCOCIN) IVPB 1000 mg/200 mL premix  Status:  Discontinued        1,000 mg 200 mL/hr over 60 Minutes Intravenous  Once 01/24/23 2210 01/24/23 2216       Nutritional status:  Body mass index is 45.41 kg/m.          Objective: Vitals:   01/27/23  0504 01/27/23 0919  BP: (!) 146/64 132/64  Pulse: 89 80  Resp: 18 16  Temp: 98.2 F (36.8 C) 99.1 F (37.3 C)  SpO2: 100% 100%    Intake/Output Summary (Last 24 hours) at 01/27/2023 1248 Last data filed at 01/27/2023 0600 Gross per 24 hour  Intake 495.04 ml  Output 900 ml  Net -404.96 ml   Filed Weights   01/24/23 2200  Weight: 131.5 kg   Weight change:  Body mass index is 45.41 kg/m.   Physical Exam: General exam: Pleasant elderly African-American female.  Not in physical distress Skin: No rashes, lesions or ulcers. HEENT: Atraumatic, normocephalic, no obvious bleeding Lungs: Clear to auscultation bilaterally CVS: Regular rate and rhythm, no murmur GI/Abd soft nontender, nondistended, bowel sound present CNS: Alert, awake, slow to respond but oriented x 3. Psychiatry: Mood appropriate. Extremities: Mild pedal edema on the left.  Right lower extremities swollen, red and tender, gradually improving.  Wrinkles noted in right leg over the areas of redness.  Data Review: I have personally reviewed the laboratory data and studies available.  F/u labs ordered Unresulted Labs (From admission, onward)     Start     Ordered   02/01/23 0500  Creatinine, serum  (enoxaparin (LOVENOX)    CrCl >/= 30 ml/min)  Weekly,   R     Comments: while on enoxaparin therapy    01/25/23 0017   01/27/23 0500  CBC with Differential/Platelet  Daily,   R      01/26/23 1057   01/27/23 0500  Basic metabolic panel  Daily,   R      01/26/23 1057            Total time spent in review of labs and imaging, patient evaluation, formulation of plan, documentation and communication with family: 45 minutes  Signed, Lorin Glass, MD [facility] 01/27/2023

## 2023-01-27 NOTE — TOC Progression Note (Signed)
Transition of Care [facility]) - Progression Note    Patient Details  Name: Sydney Pace MRN: [id number] Date of Birth: 28-Sep-1955  Transition of Care [facility]) CM/SW Contact  Larrie Kass, LCSW Phone Number: 01/27/2023, 4:11 PM  Clinical Narrative:    CSW presented bed offers, pt would like time to review. TOC to follow.   Expected Discharge Plan: Skilled Nursing Facility Barriers to Discharge: Continued Medical Work up  Expected Discharge Plan and Services In-house Referral: Clinical Social Work     Living arrangements for the past 2 months: Single Family Home                                       Social Determinants of Health (SDOH) Interventions SDOH Screenings   Food Insecurity: Food Insecurity Present (12/10/2022)  Housing: Patient Unable To Answer (12/10/2022)  Transportation Needs: No Transportation Needs (12/10/2022)  Utilities: Not At Risk (12/10/2022)  Alcohol Screen: Low Risk  (12/10/2022)  Depression (PHQ2-9): Low Risk  (12/31/2022)  Recent Concern: Depression (PHQ2-9) - Medium Risk (12/10/2022)  Financial Resource Strain: Low Risk  (11/10/2021)  Physical Activity: Inactive (12/10/2022)  Social Connections: Socially Isolated (11/10/2021)  Stress: No Stress Concern Present (11/10/2021)  Tobacco Use: Low Risk  (01/25/2023)    Readmission Risk Interventions     No data to display

## 2023-01-27 NOTE — NC FL2 (Signed)
[place] MEDICAID FL2 LEVEL OF CARE FORM     IDENTIFICATION  Patient Name: Sydney Pace Birthdate: 1956-03-26 Sex: female Admission Date (Current Location): 01/24/2023  [facility] and [place] Number:  Producer, television/film/video and Address:  [facility],  [address], [place] [address]      Provider Number: [id number]  Attending Physician Name and Address:  Lorin Glass, MD  Relative Name and Phone Number:       Current Level of Care: Hospital Recommended Level of Care: Skilled Nursing Facility Prior Approval Number:    Date Approved/Denied:   PASRR Number: [id number]  Discharge Plan: SNF    Current Diagnoses: Patient Active Problem List   Diagnosis Date Noted   Cellulitis 01/25/2023   Wound infection 12/23/2022   Vitamin D deficiency 12/23/2022   Right leg pain 09/15/2022   Polyp of colon 07/14/2021   Urinary incontinence 01/30/2021   Restless leg syndrome 08/03/2019   Attention deficit disorder (ADD) in adult 08/03/2019   Weakness of both lower extremities 07/26/2019   Left shoulder pain 07/26/2019   Hyperglycemia 07/25/2019   Shingles 10/09/2018   Lumbar radiculopathy 08/02/2018   Numbness 08/02/2018   Post-menopausal bleeding 03/17/2018   Dysuria 03/17/2018   High serum parathyroid hormone (PTH) 01/10/2018   Hypercalcemia 01/10/2018   Chronic heel pain, left 07/06/2017   Arthritis 07/06/2017   Osteopenia 07/06/2017   Hx of colonic polyp 07/06/2017   Cervical cancer screening 07/06/2017   Obesity 07/06/2017   Dry eyes, bilateral 10/10/2015   Low-tension glaucoma of both eyes, moderate stage 10/10/2015   Nuclear sclerotic cataract of both eyes 10/10/2015   Partial optic atrophy of left eye 10/10/2015   Insomnia 08/16/2014   Optic neuritis 08/16/2014   Ataxic gait 08/16/2014   Other fatigue 08/16/2014   Abdominal pain 04/03/2014   Colon cancer screening 04/03/2014   Helicobacter pylori (H. pylori) infection 03/21/2014   Preventative  health care 08/27/2013   Sinusitis 08/08/2012   Hyperlipidemia, mixed 08/08/2012   Allergy 08/08/2012   Esophageal reflux 08/08/2012   Depression 01/23/2011   Leg swelling 01/23/2011   Multiple sclerosis (HCC) 09/23/2010   Essential hypertension 09/23/2010   Overactive bladder 09/23/2010   Thyroid nodule 09/23/2010   Pulmonary nodule 09/23/2010   Fatigue 09/23/2010    Orientation RESPIRATION BLADDER Height & Weight     Self, Time, Situation, Place  Normal Incontinent, External catheter Weight: 289 lb 14.5 oz (131.5 kg) Height:  5\' 7"  (170.2 cm)  BEHAVIORAL SYMPTOMS/MOOD NEUROLOGICAL BOWEL NUTRITION STATUS      Incontinent Diet (regular)  AMBULATORY STATUS COMMUNICATION OF NEEDS Skin   Limited Assist Verbally Other (Comment) (See D/c summary)                       Personal Care Assistance Level of Assistance  Bathing, Feeding, Dressing Bathing Assistance: Limited assistance Feeding assistance: Independent Dressing Assistance: Limited assistance     Functional Limitations Info  Sight, Hearing, Speech Sight Info: Impaired (glasses) Hearing Info: Adequate Speech Info: Adequate    SPECIAL CARE FACTORS FREQUENCY  PT (By licensed PT), OT (By licensed OT)     PT Frequency: 5 x a week OT Frequency: 5 x a week            Contractures Contractures Info: Not present    Additional Factors Info  Code Status, Allergies Code Status Info: full Allergies Info: Shellfish Allergy           Current Medications (01/27/2023):  This is the current hospital active medication list Current Facility-Administered Medications  Medication Dose Route Frequency Provider Last Rate Last Admin   acetaminophen (TYLENOL) tablet 650 mg  650 mg Oral Q6H PRN Joneen Roach, Debby, MD   650 mg at 01/26/23 1900   Or   acetaminophen (TYLENOL) suppository 650 mg  650 mg Rectal Q6H PRN Gery Pray, MD   650 mg at 01/25/23 0151   enoxaparin (LOVENOX) injection 40 mg  40 mg Subcutaneous Q24H  Crosley, Debby, MD   40 mg at 01/27/23 1005   ondansetron (ZOFRAN) tablet 4 mg  4 mg Oral Q6H PRN Crosley, Debby, MD       Or   ondansetron (ZOFRAN) injection 4 mg  4 mg Intravenous Q6H PRN Crosley, Debby, MD       piperacillin-tazobactam (ZOSYN) IVPB 3.375 g  3.375 g Intravenous Q8H Maurice March, RPH 12.5 mL/hr at 01/27/23 0535 3.375 g at 01/27/23 0535   polyvinyl alcohol (LIQUIFILM TEARS) 1.4 % ophthalmic solution   Both Eyes Daily PRN Crosley, Debby, MD       potassium chloride SA (KLOR-CON M) CR tablet 40 mEq  40 mEq Oral Once Dahal, Melina Schools, MD       senna-docusate (Senokot-S) tablet 1 tablet  1 tablet Oral QHS PRN Gery Pray, MD         Discharge Medications: Please see discharge summary for a list of discharge medications.  Relevant Imaging Results:  Relevant Lab Results:   Additional Information SSN [id number]  Valentina Shaggy Courtany Mcmurphy, LCSW

## 2023-01-27 NOTE — Evaluation (Signed)
Occupational Therapy Evaluation Patient Details Name: Sydney Pace MRN: [id number] DOB: 1956/01/24 Today's Date: 01/27/2023   History of Present Illness 67 yo female admitted with AMS, R LE cellulitis, severe sepsis. Hx of bil chronic LE wounds-goes to wound center, morbid obesity, multiple sclerosis, orthostatic hypotension, depression   Clinical Impression   Pt is typically independent in ADL and mobility with RW - although she states that things have been getting really difficult for her the past 5-6 months since her legs started requiring wound care. Today she is max A for LB ADL, mod A to set up for UB ADL. She was able to come EOB with me at mod A and maintain sitting EOB for 15 min with 2 unsuccessful attempts at sit to stand. She will require +2 and lift equipment currently for RN staff. Potentially try Stedy next session with extra padding at foot plate. She will benefit from skilled OT in the acute setting as well as afterwards at the <3 hours daily rehab level to maximize safety and independence in ADL and functional transfers      If plan is discharge home, recommend the following: Two people to help with walking and/or transfers;A lot of help with bathing/dressing/bathroom;Assistance with cooking/housework;Assist for transportation;Help with stairs or ramp for entrance    Functional Status Assessment  Patient has had a recent decline in their functional status and demonstrates the ability to make significant improvements in function in a reasonable and predictable amount of time.  Equipment Recommendations  Other (comment) (defer to next venue of care)    Recommendations for Other Services PT consult     Precautions / Restrictions Precautions Precautions: Fall Precaution Comments: bil LE wounds Restrictions Weight Bearing Restrictions: No      Mobility Bed Mobility Overal bed mobility: Needs Assistance Bed Mobility: Supine to Sit, Sit to Supine     Supine to sit:  Mod assist, HOB elevated, Used rails Sit to supine: Mod assist, +2 for safety/equipment, HOB elevated, Used rails (especially for boost in bed)   General bed mobility comments: Cues for safety, technique, sequencing, and for pt to do as much for herself as she can. Increased time. Multimodal cueing. Increased time. Sat EOB for ~ 5 mintues with Min A-intermittent LOB posteriorly. Pt reported some dizziness. Fatigued easily. Weak.    Transfers Overall transfer level: Needs assistance Equipment used: Rolling walker (2 wheels) (WIDE) Transfers: Sit to/from Stand Sit to Stand: Max assist, From elevated surface           General transfer comment: attempted x2 with max A of 1 and unsuccessful.      Balance Overall balance assessment: Needs assistance Sitting-balance support: Bilateral upper extremity supported, Feet supported Sitting balance-Leahy Scale: Poor Sitting balance - Comments: maintained sitting EOB, initially with posterior lean which improved as session went on                                   ADL either performed or assessed with clinical judgement   ADL Overall ADL's : Needs assistance/impaired Eating/Feeding: Modified independent   Grooming: Set up;Wash/dry face   Upper Body Bathing: Moderate assistance Upper Body Bathing Details (indicate cue type and reason): for back Lower Body Bathing: Moderate assistance Lower Body Bathing Details (indicate cue type and reason): knees down Upper Body Dressing : Set up   Lower Body Dressing: Maximal assistance   Toilet Transfer: Total assistance (lift currently)  Toileting- Clothing Manipulation and Hygiene: Maximal assistance;Bed level       Functional mobility during ADLs: Total assistance;+2 for physical assistance;+2 for safety/equipment (lift needed currently) General ADL Comments: confusion improving from admission, weak and in pain RLE>LLE.     Vision Baseline Vision/History: 1 Wears  glasses Ability to See in Adequate Light: 0 Adequate Patient Visual Report: No change from baseline Vision Assessment?: No apparent visual deficits     Perception         Praxis         Pertinent Vitals/Pain Pain Assessment Pain Assessment: 0-10 Pain Score: 5  Pain Location: bil LEs (R>L) Pain Descriptors / Indicators: Discomfort, Grimacing, Constant Pain Intervention(s): Limited activity within patient's tolerance, Monitored during session, Repositioned     Extremity/Trunk Assessment Upper Extremity Assessment Upper Extremity Assessment: Generalized weakness   Lower Extremity Assessment Lower Extremity Assessment: Defer to PT evaluation   Cervical / Trunk Assessment Cervical / Trunk Assessment: Normal   Communication Communication Communication: No apparent difficulties Cueing Techniques: Verbal cues   Cognition Arousal: Alert Behavior During Therapy: WFL for tasks assessed/performed Overall Cognitive Status: Within Functional Limits for tasks assessed                                       General Comments       Exercises Exercises: General Lower Extremity General Exercises - Lower Extremity Ankle Circles/Pumps: AROM, Both, 10 reps, Supine Heel Slides: AAROM, Left, Right, Supine, 5 reps Hip ABduction/ADduction: AAROM, Right, Left, 10 reps, Supine   Shoulder Instructions      Home Living Family/patient expects to be discharged to:: Private residence Living Arrangements: Alone Available Help at Discharge: Family;Friend(s);Available PRN/intermittently Type of Home: House Home Access: Stairs to enter     Home Layout: One level     Bathroom Shower/Tub: Chief Strategy Officer: Standard     Home Equipment: Agricultural consultant (2 wheels);Cane - single point;Tub bench   Additional Comments: does not work, hard for her to [employer] - and she used to love cooking especially for others      Prior Functioning/Environment Prior Level of  Function : Independent/Modified Independent             Mobility Comments: uses walker ADLs Comments: mod ind with ADLs, tub bench, struggle with IADL the past 5-6 months since Bil leg wounds started        OT Problem List: Decreased strength;Decreased range of motion;Decreased activity tolerance;Impaired balance (sitting and/or standing);Decreased safety awareness;Decreased knowledge of use of DME or AE;Obesity;Pain;Increased edema      OT Treatment/Interventions: Self-care/ADL training;DME and/or AE instruction;Manual therapy;Therapeutic activities;Patient/family education;Balance training    OT Goals(Current goals can be found in the care plan section) Acute Rehab OT Goals Patient Stated Goal: "get stronger again" OT Goal Formulation: With patient Time For Goal Achievement: 02/10/23 Potential to Achieve Goals: Good ADL Goals Pt Will Perform Grooming: with modified independence;sitting Pt Will Perform Upper Body Dressing: with contact guard assist;sitting Pt Will Perform Lower Body Dressing: sit to/from stand;with mod assist Pt Will Transfer to Toilet: with mod assist;stand pivot transfer;bedside commode Pt Will Perform Toileting - Clothing Manipulation and hygiene: with mod assist;sit to/from stand Pt/caregiver will Perform Home Exercise Program: Both right and left upper extremity;Independently;With written HEP provided Additional ADL Goal #1: Pt will verbalize at least 3 energy conservation strategies during ADL with no cues  OT Frequency: Min  1X/week    Co-evaluation              AM-PAC OT "6 Clicks" Daily Activity     Outcome Measure Help from another person eating meals?: None Help from another person taking care of personal grooming?: A Little Help from another person toileting, which includes using toliet, bedpan, or urinal?: A Lot Help from another person bathing (including washing, rinsing, drying)?: A Lot Help from another person to put on and taking off  regular upper body clothing?: A Lot Help from another person to put on and taking off regular lower body clothing?: A Lot 6 Click Score: 15   End of Session Equipment Utilized During Treatment: Gait belt;Rolling walker (2 wheels) (WIDE) Nurse Communication: Mobility status;Need for lift equipment;Precautions  Activity Tolerance: Patient tolerated treatment well Patient left: in bed;with call bell/phone within reach;with bed alarm set  OT Visit Diagnosis: Unsteadiness on feet (R26.81);Other abnormalities of gait and mobility (R26.89);Muscle weakness (generalized) (M62.81);Pain Pain - Right/Left: Right Pain - part of body: Leg                Time: 4034-7425 OT Time Calculation (min): 35 min Charges:  OT General Charges $OT Visit: 1 Visit OT Evaluation $OT Eval Moderate Complexity: 1 Mod OT Treatments $Self Care/Home Management : 8-22 mins Nyoka Cowden OTR/L Acute Rehabilitation Services Office: [phone]  Evern [facility] 01/27/2023, 12:50 PM

## 2023-01-28 ENCOUNTER — Ambulatory Visit: Payer: Medicare (Managed Care) | Admitting: Family Medicine

## 2023-01-28 ENCOUNTER — Inpatient Hospital Stay (HOSPITAL_COMMUNITY): Payer: Medicare (Managed Care)

## 2023-01-28 DIAGNOSIS — R7881 Bacteremia: Secondary | ICD-10-CM | POA: Diagnosis not present

## 2023-01-28 DIAGNOSIS — L089 Local infection of the skin and subcutaneous tissue, unspecified: Secondary | ICD-10-CM | POA: Diagnosis not present

## 2023-01-28 DIAGNOSIS — L03115 Cellulitis of right lower limb: Secondary | ICD-10-CM | POA: Diagnosis not present

## 2023-01-28 DIAGNOSIS — B9689 Other specified bacterial agents as the cause of diseases classified elsewhere: Secondary | ICD-10-CM

## 2023-01-28 DIAGNOSIS — T148XXA Other injury of unspecified body region, initial encounter: Secondary | ICD-10-CM | POA: Diagnosis not present

## 2023-01-28 LAB — CBC WITH DIFFERENTIAL/PLATELET
Abs Immature Granulocytes: 0.73 10*3/uL — ABNORMAL HIGH (ref 0.00–0.07)
Basophils Absolute: 0.1 10*3/uL (ref 0.0–0.1)
Basophils Relative: 1 %
Eosinophils Absolute: 0.4 10*3/uL (ref 0.0–0.5)
Eosinophils Relative: 2 %
HCT: 31.3 % — ABNORMAL LOW (ref 36.0–46.0)
Hemoglobin: 9.9 g/dL — ABNORMAL LOW (ref 12.0–15.0)
Immature Granulocytes: 4 %
Lymphocytes Relative: 12 %
Lymphs Abs: 2.1 10*3/uL (ref 0.7–4.0)
MCH: 29.3 pg (ref 26.0–34.0)
MCHC: 31.6 g/dL (ref 30.0–36.0)
MCV: 92.6 fL (ref 80.0–100.0)
Monocytes Absolute: 0.9 10*3/uL (ref 0.1–1.0)
Monocytes Relative: 5 %
Neutro Abs: 13.3 10*3/uL — ABNORMAL HIGH (ref 1.7–7.7)
Neutrophils Relative %: 76 %
Platelets: 235 10*3/uL (ref 150–400)
RBC: 3.38 MIL/uL — ABNORMAL LOW (ref 3.87–5.11)
RDW: 16.2 % — ABNORMAL HIGH (ref 11.5–15.5)
WBC: 17.6 10*3/uL — ABNORMAL HIGH (ref 4.0–10.5)
nRBC: 0.1 % (ref 0.0–0.2)

## 2023-01-28 LAB — BASIC METABOLIC PANEL
Anion gap: 9 (ref 5–15)
BUN: 19 mg/dL (ref 8–23)
CO2: 23 mmol/L (ref 22–32)
Calcium: 9 mg/dL (ref 8.9–10.3)
Chloride: 103 mmol/L (ref 98–111)
Creatinine, Ser: 1.05 mg/dL — ABNORMAL HIGH (ref 0.44–1.00)
GFR, Estimated: 58 mL/min — ABNORMAL LOW (ref >60)
Glucose, Bld: 108 mg/dL — ABNORMAL HIGH (ref 70–99)
Potassium: 3 mmol/L — ABNORMAL LOW (ref 3.5–5.1)
Sodium: 135 mmol/L (ref 135–145)

## 2023-01-28 LAB — ECHOCARDIOGRAM COMPLETE
Area-P 1/2: 3.28 cm2
Height: 67 in
S' Lateral: 2.9 cm
Single Plane A4C EF: 64.9 %
Weight: 4638.48 oz

## 2023-01-28 LAB — CULTURE, BLOOD (ROUTINE X 2)
Special Requests: ADEQUATE
Special Requests: ADEQUATE

## 2023-01-28 MED ORDER — SODIUM CHLORIDE 0.9 % IV SOLN
1.0000 g | Freq: Three times a day (TID) | INTRAVENOUS | Status: DC
  Filled 2023-01-28 (×5): qty 20

## 2023-01-28 MED ORDER — SODIUM CHLORIDE 0.9 % IV SOLN
1.0000 g | Freq: Three times a day (TID) | INTRAVENOUS | Status: DC
  Administered 2023-01-28 – 2023-02-02 (×14): 1 g via INTRAVENOUS
  Filled 2023-01-28 (×15): qty 20

## 2023-01-28 MED ORDER — SODIUM CHLORIDE 0.9 % IV SOLN
1.0000 g | Freq: Three times a day (TID) | INTRAVENOUS | Status: DC
  Filled 2023-01-28: qty 20

## 2023-01-28 NOTE — Progress Notes (Signed)
Physical Therapy Treatment Patient Details Name: Sydney Pace MRN: [id number] DOB: [birth year] Today's Date: 01/28/2023   History of Present Illness 67 yo female admitted with AMS, R LE cellulitis, severe sepsis. Hx of bil chronic LE wounds-goes to wound center, morbid obesity, multiple sclerosis, orthostatic hypotension, depression    PT Comments  Pt seen for PT tx with pt agreeable, niece present for session & providing hands on assistance throughout as needed. Pt is progressing well with mobility as pt is able to complete supine>sit with supervision & reliance on hospital bed features. Pt is able to transfer STS with +2 assist with cuing for hand placement; pt limited by fear of falling but able to pivot to recliner. Encouraged OOB mobility & BLE exercises while sitting up. Will continue to follow pt acutely to address strengthening, balance, endurance, transfers & gait with LRAD.    If plan is discharge home, recommend the following: Direct supervision/assist for financial management;Assist for transportation;Help with stairs or ramp for entrance;Two people to help with walking and/or transfers;Two people to help with bathing/dressing/bathroom   Can travel by private vehicle     No  Equipment Recommendations  None recommended by PT (defer to next venue)    Recommendations for Other Services       Precautions / Restrictions Precautions Precautions: Fall Precaution Comments: bil LE wounds Restrictions Weight Bearing Restrictions: No     Mobility  Bed Mobility Overal bed mobility: Needs Assistance Bed Mobility: Supine to Sit     Supine to sit: Supervision, HOB elevated, Used rails     General bed mobility comments: extra time but able to come to sitting EOB without physical assistance    Transfers Overall transfer level: Needs assistance Equipment used: Rolling walker (2 wheels) Transfers: Sit to/from Stand, Bed to chair/wheelchair/BSC Sit to Stand: +2 safety/equipment,  +2 physical assistance, Mod assist   Step pivot transfers: Min assist, +2 safety/equipment, +2 physical assistance       General transfer comment: Pt transfers STS from elevated EOB with max assist & 2nd person present for increased pt comfort & providing some assistance. Pt requires assistance to transition 1UE from pushing from EOB to recliner after PT provides cuing for hand placement during STS. Pt is able to complete step pivot bed>recliner with RW with extra time.    Ambulation/Gait                   Stairs             Wheelchair Mobility     Tilt Bed    Modified Rankin (Stroke Patients Only)       Balance Overall balance assessment: Needs assistance Sitting-balance support: Bilateral upper extremity supported, Feet supported Sitting balance-Leahy Scale: Fair Sitting balance - Comments: supervision static sitting EOB   Standing balance support: Reliant on assistive device for balance, Bilateral upper extremity supported, During functional activity Standing balance-Leahy Scale: Poor                              Cognition Arousal: Alert Behavior During Therapy: Anxious, WFL for tasks assessed/performed (fearful of falling)                                            Exercises General Exercises - Lower Extremity Long Arc Quad: AROM, Seated, Strengthening, Both, 10 reps  Hip Flexion/Marching: AROM, Seated, Strengthening, Both, 10 reps    General Comments General comments (skin integrity, edema, etc.): SpO2 >90% on room air, pt wheezing, feeling SOB. PT reviewed use of incentive spirometer & benefits of OOB mobility with pt return demonstrating use of IS fairly well.      Pertinent Vitals/Pain Pain Assessment Pain Assessment: Faces Faces Pain Scale: Hurts whole lot Pain Location: BLE Pain Descriptors / Indicators: Discomfort, Grimacing, Constant Pain Intervention(s): Monitored during session, Limited activity within  patient's tolerance    Home Living                          Prior Function            PT Goals (current goals can now be found in the care plan section) Acute Rehab PT Goals Patient Stated Goal: get stronger. less pain PT Goal Formulation: With patient Time For Goal Achievement: 02/09/23 Potential to Achieve Goals: Good Progress towards PT goals: Progressing toward goals    Frequency    Min 1X/week      PT Plan      Co-evaluation              AM-PAC PT "6 Clicks" Mobility   Outcome Measure  Help needed turning from your back to your side while in a flat bed without using bedrails?: A Lot Help needed moving from lying on your back to sitting on the side of a flat bed without using bedrails?: A Lot Help needed moving to and from a bed to a chair (including a wheelchair)?: A Lot Help needed standing up from a chair using your arms (e.g., wheelchair or bedside chair)?: Total Help needed to walk in hospital room?: Total Help needed climbing 3-5 steps with a railing? : Total 6 Click Score: 9    End of Session Equipment Utilized During Treatment: Gait belt Activity Tolerance: Patient limited by fatigue;Patient limited by pain Patient left: in chair;with chair alarm set;with call bell/phone within reach;with family/visitor present Nurse Communication: Mobility status PT Visit Diagnosis: Muscle weakness (generalized) (M62.81);Difficulty in walking, not elsewhere classified (R26.2);Pain;Other abnormalities of gait and mobility (R26.89);Unsteadiness on feet (R26.81) Pain - Right/Left:  (bilateral) Pain - part of body: Leg     Time: 1610-9604 PT Time Calculation (min) (ACUTE ONLY): 26 min  Charges:    $Therapeutic Activity: 23-37 mins PT General Charges $$ ACUTE PT VISIT: 1 Visit                     Aleda Grana, PT, DPT 01/28/23, 1:29 PM   Sandi Mariscal 01/28/2023, 1:28 PM

## 2023-01-28 NOTE — Consult Note (Signed)
[facility]  Total days of antibiotics 4 piptazo Reason for Consult: achromobacter bacteremia secondary to cellulitis/SSTI   Referring Physician: dahal  Principal Problem:   Cellulitis Active Problems:   Multiple sclerosis (HCC)   Essential hypertension   Hyperlipidemia, mixed   Attention deficit disorder (ADD) in adult   Wound infection    HPI: Sydney Pace is a 67 y.o. female with with hx of HTN, MS, also hx of non-healing wounds to lower extremity. Was seen by pcp at end of July where she had 2 rounds of oral abtx, doxy followed by cephalexin with little improvement. She denied having systemic symptoms at that time. On 7/23 was give a course of augmentin. She has been off of abtx for the past month, just the weeks prior to admission, increasingly right leg redness and swelling/ and drainage. She has upcoming wound care clinic visit this week, for the first time. Her wounds have been present for the past year per her report.  Past Medical History:  Diagnosis Date   Anemia    Arthritis    on meds   Arthritis 07/06/2017   Constipation    occ   Dehydration 06/09/2016   Depression    on meds   Esophageal reflux 08/08/2012   on meds   GERD (gastroesophageal reflux disease)    on meds   Hypertension    on meds   Inverted nipple    right nipple has always been inverted - per pt   MS (multiple sclerosis) (HCC)    Neuromuscular disorder (HCC)    MS   Other and unspecified hyperlipidemia 08/08/2012   on meds   Seasonal allergies    Thyroid disease     Allergies:  Allergies  Allergen Reactions   Shellfish Allergy Hives, Shortness Of Breath, Itching, Swelling and Rash    Pt had previously carried an epi-pen and had a history of severe reaction to shrimp with breathing problems and swelling of lips and tongue      MEDICATIONS:  enoxaparin (LOVENOX) injection  40 mg Subcutaneous Q24H   furosemide  20 mg Oral Daily   loratadine  10 mg Oral  Daily    Social History   Tobacco Use   Smoking status: Never   Smokeless tobacco: Never  Vaping Use   Vaping status: Never Used  Substance Use Topics   Alcohol use: Not Currently    Alcohol/week: 0.0 - 1.0 standard drinks of alcohol    Comment: occassional   Drug use: No    Family History  Problem Relation Age of Onset   Heart disease Mother        pacemaker   Emphysema Mother    Hypertension Mother    Heart disease Father    Diabetes Father    Heart disease Sister        cad   Sleep apnea Brother    Colon cancer Neg Hx    Esophageal cancer Neg Hx    Rectal cancer Neg Hx    Stomach cancer Neg Hx    Colon polyps Neg Hx      Review of Systems  Constitutional: Negative for fever, chills, diaphoresis, activity change, appetite change, fatigue and unexpected weight change.  HENT: Negative for congestion, sore throat, rhinorrhea, sneezing, trouble swallowing and sinus pressure.  Eyes: Negative for photophobia and visual disturbance.  Respiratory: Negative for cough, chest tightness, shortness of breath, wheezing and stridor.  Cardiovascular: Negative for chest pain, palpitations and  leg swelling.  Gastrointestinal: Negative for nausea, vomiting, abdominal pain, diarrhea, constipation, blood in stool, abdominal distention and anal bleeding.  Genitourinary: Negative for dysuria, hematuria, flank pain and difficulty urinating.  Musculoskeletal: Negative for myalgias, back pain, joint swelling, arthralgias and gait problem.  Skin: + redness, pain, swelling and drainage Neurological: Negative for dizziness, tremors, weakness and light-headedness.  Hematological: Negative for adenopathy. Does not bruise/bleed easily.  Psychiatric/Behavioral: Negative for behavioral problems, confusion, sleep disturbance, dysphoric mood, decreased concentration and agitation.    OBJECTIVE: Temp:  [98.1 F (36.7 C)-99.7 F (37.6 C)] 98.1 F (36.7 C) (08/29 1300) Pulse Rate:  [77-86] 84  (08/29 1300) Resp:  [19-20] 19 (08/29 1300) BP: (120-147)/(50-79) 137/79 (08/29 1300) SpO2:  [97 %-99 %] 99 % (08/29 1300) Physical Exam  Constitutional:  oriented to person, place, and time. appears well-developed and well-nourished. No distress.  HENT: [place]/AT, PERRLA, no scleral icterus Mouth/Throat: Oropharynx is clear and moist. No oropharyngeal exudate.  Cardiovascular: Normal rate, regular rhythm and normal heart sounds. Exam reveals no gallop and no friction rub.  No murmur heard.  Pulmonary/Chest: Effort normal and breath sounds normal. No respiratory distress.  has no wheezes.  Neck = supple, no nuchal rigidity Abdominal: Soft. Bowel sounds are normal.  exhibits no distension. There is no tenderness.  Lymphadenopathy: no cervical adenopathy. No axillary adenopathy Neurological: alert and oriented to person, place, and time.  Skin: Skin is warm and dry. No rash noted. No erythema.  Psychiatric: a normal mood and affect.  behavior is normal.    LABS: Results for orders placed or performed during the hospital encounter of 01/24/23 (from the past 48 hour(s))  Glucose, capillary     Status: None   Collection Time: 01/26/23  7:11 PM  Result Value Ref Range   Glucose-Capillary 90 70 - 99 mg/dL    Comment: Glucose reference range applies only to samples taken after fasting for at least 8 hours.  CBC with Differential/Platelet     Status: Abnormal   Collection Time: 01/27/23  4:16 AM  Result Value Ref Range   WBC 15.7 (H) 4.0 - 10.5 K/uL   RBC 3.41 (L) 3.87 - 5.11 MIL/uL   Hemoglobin 9.9 (L) 12.0 - 15.0 g/dL   HCT 16.1 (L) 09.6 - 04.5 %   MCV 90.9 80.0 - 100.0 fL   MCH 29.0 26.0 - 34.0 pg   MCHC 31.9 30.0 - 36.0 g/dL   RDW 40.9 (H) 81.1 - 91.4 %   Platelets 200 150 - 400 K/uL   nRBC 0.1 0.0 - 0.2 %   Neutrophils Relative % 88 %   Neutro Abs 13.7 (H) 1.7 - 7.7 K/uL   Lymphocytes Relative 7 %   Lymphs Abs 1.1 0.7 - 4.0 K/uL   Monocytes Relative 3 %   Monocytes Absolute 0.5  0.1 - 1.0 K/uL   Eosinophils Relative 1 %   Eosinophils Absolute 0.1 0.0 - 0.5 K/uL   Basophils Relative 0 %   Basophils Absolute 0.1 0.0 - 0.1 K/uL   Immature Granulocytes 1 %   Abs Immature Granulocytes 0.17 (H) 0.00 - 0.07 K/uL    Comment: Performed at [facility], [address], [place], [place] [address]  Basic metabolic panel     Status: Abnormal   Collection Time: 01/27/23  4:16 AM  Result Value Ref Range   Sodium 136 135 - 145 mmol/L   Potassium 3.1 (L) 3.5 - 5.1 mmol/L   Chloride 104 98 - 111 mmol/L  CO2 20 (L) 22 - 32 mmol/L   Glucose, Bld 114 (H) 70 - 99 mg/dL    Comment: Glucose reference range applies only to samples taken after fasting for at least 8 hours.   BUN 18 8 - 23 mg/dL   Creatinine, Ser 9.62 (H) 0.44 - 1.00 mg/dL   Calcium 9.0 8.9 - 95.2 mg/dL   GFR, Estimated 59 (L) >60 mL/min    Comment: (NOTE) Calculated using the CKD-EPI Creatinine Equation (2021)    Anion gap 12 5 - 15    Comment: Performed at [facility], [address] [place], [place] [address]  CK     Status: Abnormal   Collection Time: 01/27/23  4:16 AM  Result Value Ref Range   Total CK 281 (H) 38 - 234 U/L    Comment: Performed at [facility], [address], [place], [place] [address]  CBC with Differential/Platelet     Status: Abnormal   Collection Time: 01/28/23  6:22 AM  Result Value Ref Range   WBC 17.6 (H) 4.0 - 10.5 K/uL   RBC 3.38 (L) 3.87 - 5.11 MIL/uL   Hemoglobin 9.9 (L) 12.0 - 15.0 g/dL   HCT 27.2 (L) 53.6 - 64.4 %   MCV 92.6 80.0 - 100.0 fL   MCH 29.3 26.0 - 34.0 pg   MCHC 31.6 30.0 - 36.0 g/dL   RDW 03.4 (H) 74.2 - 59.5 %   Platelets 235 150 - 400 K/uL   nRBC 0.1 0.0 - 0.2 %   Neutrophils Relative % 76 %   Neutro Abs 13.3 (H) 1.7 - 7.7 K/uL   Lymphocytes Relative 12 %   Lymphs Abs 2.1 0.7 - 4.0 K/uL   Monocytes Relative 5 %   Monocytes Absolute 0.9 0.1 - 1.0 K/uL   Eosinophils Relative 2 %   Eosinophils  Absolute 0.4 0.0 - 0.5 K/uL   Basophils Relative 1 %   Basophils Absolute 0.1 0.0 - 0.1 K/uL   Immature Granulocytes 4 %   Abs Immature Granulocytes 0.73 (H) 0.00 - 0.07 K/uL    Comment: Performed at [facility], [address] [place], [place] [address]  Basic metabolic panel     Status: Abnormal   Collection Time: 01/28/23  6:22 AM  Result Value Ref Range   Sodium 135 135 - 145 mmol/L   Potassium 3.0 (L) 3.5 - 5.1 mmol/L   Chloride 103 98 - 111 mmol/L   CO2 23 22 - 32 mmol/L   Glucose, Bld 108 (H) 70 - 99 mg/dL    Comment: Glucose reference range applies only to samples taken after fasting for at least 8 hours.   BUN 19 8 - 23 mg/dL   Creatinine, Ser 6.43 (H) 0.44 - 1.00 mg/dL   Calcium 9.0 8.9 - 32.9 mg/dL   GFR, Estimated 58 (L) >60 mL/min    Comment: (NOTE) Calculated using the CKD-EPI Creatinine Equation (2021)    Anion gap 9 5 - 15    Comment: Performed at [facility], [address] [place], [place] [address]    MICRO: 8/29: blood cx NGTD IMAGING: No results found.  Assessment/Plan:  67yo. F hypertension, MS, obesity who has had chronic wounds to her lower extremity in the last year not responding to oral abtx - now admitted with sepsis with cellulitis, increasing drainage to wounds to lower extremity - with secondary achromobacter.= bacteremia  - pip/tazo won't work ag Lawyer -- will plan  to change to meropenem, would like to see response to abtx before switching to orals - continue with wound care recs -still recommend that she sees wound care as an outpatient  - leukocytosis = anticipate to improve on proper abtx -

## 2023-01-28 NOTE — TOC Progression Note (Signed)
Transition of Care [facility]) - Progression Note    Patient Details  Name: Sydney Pace MRN: [id number] Date of Birth: [birth year]  Transition of Care [facility]) CM/SW Contact  Larrie Kass, LCSW Phone Number: 01/28/2023, 10:12 AM  Clinical Narrative:    CSW spoke with pt and her Niece Sydney Pace , they have chosen [facility]. Pt will need insurance authorization. TOC to follow.   Expected Discharge Plan: Skilled Nursing Facility Barriers to Discharge: Continued Medical Work up  Expected Discharge Plan and Services In-house Referral: Clinical Social Work     Living arrangements for the past 2 months: Single Family Home                                       Social Determinants of Health (SDOH) Interventions SDOH Screenings   Food Insecurity: Food Insecurity Present (12/10/2022)  Housing: Patient Unable To Answer (12/10/2022)  Transportation Needs: No Transportation Needs (12/10/2022)  Utilities: Not At Risk (12/10/2022)  Alcohol Screen: Low Risk  (12/10/2022)  Depression (PHQ2-9): Low Risk  (12/31/2022)  Recent Concern: Depression (PHQ2-9) - Medium Risk (12/10/2022)  Financial Resource Strain: Low Risk  (11/10/2021)  Physical Activity: Inactive (12/10/2022)  Social Connections: Socially Isolated (11/10/2021)  Stress: No Stress Concern Present (11/10/2021)  Tobacco Use: Low Risk  (01/25/2023)    Readmission Risk Interventions     No data to display

## 2023-01-28 NOTE — Progress Notes (Signed)
  Echocardiogram 2D Echocardiogram has been performed.  Milda Smart 01/28/2023, 3:50 PM

## 2023-01-28 NOTE — Progress Notes (Signed)
Chaplain engaged in an initial visit with Ghina and her niece, Ladona Ridgel. Chaplain provided Advanced Directive, Healthcare POA, education and support. Chaplain facilitated the notarization, witnessing, and completion of the Advanced Directive. Chaplain provided [place] with three copies, including the original. Chaplain input one copy in her medical chart and scanned one to ACP.    01/28/23 1300  Spiritual Encounters  Type of Visit Initial  Care provided to: Pt and family  Reason for visit Advance directives

## 2023-01-28 NOTE — Progress Notes (Signed)
PROGRESS NOTE  Sydney Pace  DOB: 05/25/1956  PCP: Bradd Canary, MD QMV:[id number]  DOA: 01/24/2023  LOS: 3 days  Hospital Day: 5  Brief narrative: Sydney Pace is a 67 y.o. female with PMH significant for morbid obesity, multiple sclerosis, HTN, GERD, hypothyroidism, depression, constipation who lives alone. 8/25, patient family went to check on her as they had not been able to contact her lately.  They found her confused, sitting on the toilet and brought to the ED  In the ED, patient had a fever of 100.7, blood pressure 171/76, breathing room air She was somnolent, disoriented She was noted to have extensive erythema and swelling of right lower extremity up to the groin.  Ulcerative skin on both lower extremity with drainage. Labs showed WBC count elevated to 16.2, lactic acid elevated to 2.2, CK level 1652 Respiratory virus panel unremarkable Urinalysis showed hazy yellow urine with large amount of hemoglobin, no bacteria nitrates or leukocytes Blood culture was sent CT head unremarkable for acute findings CT right lower extremity showed subcutaneous edema in the distal thigh and lower leg consistent with cellulitis.  No evidence of focal collection, foreign body or emphysema or osteomyelitis. Started on IV antibiotics, IV fluid Admitted to [facility] Wound care consulted  Subjective: Patient was seen and examined this morning.   Sitting up in recliner.  Not in distress.  Niece at bedside.   No fever in last 24 hours.  But WBC count is worse today.  Assessment and plan: Severe sepsis POA Extensive Cellulitis RLE  B/L LE ulcerations Met severe sepsis criteria with fever, leukocytosis, lactic acidosis, altered mental status, source of infection Clinical and radiographic evidence of cellulitis without abscess Blood culture sent and obtained did not show any growth. Patient is currently on IV Zosyn.  She had breakthrough fever of 103 yesterday and had 2 more episodes overnight.   WBC count slightly up today. She continues to have right lower extremity swelling, redness which clinically does not seem to be worsening. Recent Labs  Lab 01/24/23 2240 01/24/23 2253 01/25/23 0343 01/27/23 0416 01/28/23 0622  WBC 16.2*  --  14.2* 15.7* 17.6*  LATICACIDVEN  --  2.2* 1.3  --   --    Polymicrobial bacteremia Blood culture is growing Staph epidermidis and Achromobacter xylosoxidans on all bottles. True versus contamination. Repeat blood culture ordered.  Echocardiogram ordered. ID consulted. Switched from IV Zosyn to IV meropenem today  Acute metabolic encephalopathy Due to above infection.   Mental status gradually improving.  She was delirious with high fever yesterday.   Mental status this morning is intact.  Continue monitor mental status change   Mild rhabdomyolysis Due to being in the same position for long time. CK level elevated to 1600s.  Adequately hydrated.   Repeat CK level on 8/28 showed an improvement to 281. Recent Labs  Lab 01/24/23 2240 01/27/23 0416  CKTOTAL 1,652* 281*    Multiple sclerosis   CT head negative for new lesions. If patient with poor response to antibiotics, consider MRI brain, cervical spine to evaluate for MS flare   Essential hypertension Previously on lisinopril and HCTZ which are currently on hold.  She was also on Lasix 20 mg daily as needed. She required IV fluid without abnormalities. 8/28, Lasix 20 mg daily was restarted.  Goiter CT chest showed diffuse enlarged thyroid slightly larger than last seen in 2010.  No underlying adenopathy.  Radiologist also reported that thyroid has been previously evaluated with ultrasound and biopsy  ADHD Depression PTA meds-Abilify, Zoloft, bupropion, Neurontin, Ritalin  Pulmonary nodules Per CT chest, patient had stable left upper lobe nodule from 2010. No further follow-up is recommended.  Generalized weakness Impaired mobility PT eval obtained.  SNF recommended.  Goals  of care   Code Status: Full Code     DVT prophylaxis:  enoxaparin (LOVENOX) injection 40 mg Start: 01/25/23 1000   Antimicrobials: IV meropenem Fluid: None Consultants: None Family Communication: Niece at bedside  Status: Inpatient Level of care:  Telemetry   Patient is from: Home Needs to continue in-hospital care: Antibiotics changed today because of polymicrobial bacteremia Anticipated d/c to: Pending clinical course    Diet:  Diet Order             Diet regular Room service appropriate? Yes; Fluid consistency: Thin  Diet effective now                   Scheduled Meds:  enoxaparin (LOVENOX) injection  40 mg Subcutaneous Q24H   furosemide  20 mg Oral Daily   loratadine  10 mg Oral Daily    PRN meds: acetaminophen **OR** acetaminophen, ondansetron **OR** ondansetron (ZOFRAN) IV, polyvinyl alcohol, senna-docusate   Infusions:   meropenem (MERREM) IV      Antimicrobials: Anti-infectives (From admission, onward)    Start     Dose/Rate Route Frequency Ordered Stop   01/28/23 1045  meropenem (MERREM) 1 g in sodium chloride 0.9 % 100 mL IVPB        1 g 200 mL/hr over 30 Minutes Intravenous Every 8 hours 01/28/23 0955     01/25/23 0600  piperacillin-tazobactam (ZOSYN) IVPB 3.375 g  Status:  Discontinued        3.375 g 12.5 mL/hr over 240 Minutes Intravenous Every 8 hours 01/25/23 0025 01/28/23 0955   01/24/23 2230  vancomycin (VANCOREADY) IVPB 2000 mg/400 mL        2,000 mg 200 mL/hr over 120 Minutes Intravenous  Once 01/24/23 2216 01/25/23 0200   01/24/23 2215  ceFEPIme (MAXIPIME) 2 g in sodium chloride 0.9 % 100 mL IVPB        2 g 200 mL/hr over 30 Minutes Intravenous  Once 01/24/23 2210 01/25/23 0104   01/24/23 2215  metroNIDAZOLE (FLAGYL) IVPB 500 mg        500 mg 100 mL/hr over 60 Minutes Intravenous  Once 01/24/23 2210 01/25/23 0104   01/24/23 2215  vancomycin (VANCOCIN) IVPB 1000 mg/200 mL premix  Status:  Discontinued        1,000 mg 200 mL/hr  over 60 Minutes Intravenous  Once 01/24/23 2210 01/24/23 2216       Nutritional status:  Body mass index is 45.41 kg/m.          Objective: Vitals:   01/28/23 0800 01/28/23 1300  BP: (!) 124/56 137/79  Pulse: 79 84  Resp:  19  Temp: 98.6 F (37 C) 98.1 F (36.7 C)  SpO2: 98% 99%    Intake/Output Summary (Last 24 hours) at 01/28/2023 1359 Last data filed at 01/28/2023 0700 Gross per 24 hour  Intake 207.16 ml  Output --  Net 207.16 ml   Filed Weights   01/24/23 2200  Weight: 131.5 kg   Weight change:  Body mass index is 45.41 kg/m.   Physical Exam: General exam: Pleasant elderly African-American female.  Not in physical distress Skin: No rashes, lesions or ulcers. HEENT: Atraumatic, normocephalic, no obvious bleeding Lungs: Clear to auscultation bilaterally CVS: Regular rate and rhythm,  no murmur GI/Abd soft nontender, nondistended, bowel sound present CNS: Alert, awake, slow to respond but oriented x 3. Psychiatry: Mood appropriate Extremities: Mild pedal edema on the left.  Right lower extremities swollen, red and tender, gradually improving.  Wrinkles noted in right leg over the areas of redness.  Data Review: I have personally reviewed the laboratory data and studies available.  F/u labs ordered Unresulted Labs (From admission, onward)     Start     Ordered   02/01/23 0500  Creatinine, serum  (enoxaparin (LOVENOX)    CrCl >/= 30 ml/min)  Weekly,   R     Comments: while on enoxaparin therapy    01/25/23 0017   01/28/23 0758  Culture, blood (Routine X 2) w Reflex to ID Panel  BLOOD CULTURE X 2,   R      01/28/23 0757   01/27/23 0500  CBC with Differential/Platelet  Daily,   R      01/26/23 1057   01/27/23 0500  Basic metabolic panel  Daily,   R      01/26/23 1057            Total time spent in review of labs and imaging, patient evaluation, formulation of plan, documentation and communication with family: 45 minutes  Signed, Lorin Glass,  MD [facility] 01/28/2023

## 2023-01-29 ENCOUNTER — Ambulatory Visit (HOSPITAL_BASED_OUTPATIENT_CLINIC_OR_DEPARTMENT_OTHER): Payer: Medicare (Managed Care) | Admitting: Internal Medicine

## 2023-01-29 DIAGNOSIS — L03115 Cellulitis of right lower limb: Secondary | ICD-10-CM | POA: Diagnosis not present

## 2023-01-29 DIAGNOSIS — D72829 Elevated white blood cell count, unspecified: Secondary | ICD-10-CM

## 2023-01-29 DIAGNOSIS — R600 Localized edema: Secondary | ICD-10-CM | POA: Diagnosis not present

## 2023-01-29 LAB — CBC WITH DIFFERENTIAL/PLATELET
Abs Immature Granulocytes: 0.1 10*3/uL — ABNORMAL HIGH (ref 0.00–0.07)
Basophils Absolute: 0 10*3/uL (ref 0.0–0.1)
Basophils Relative: 0 %
Eosinophils Absolute: 0.4 10*3/uL (ref 0.0–0.5)
Eosinophils Relative: 3 %
HCT: 28.4 % — ABNORMAL LOW (ref 36.0–46.0)
Hemoglobin: 9 g/dL — ABNORMAL LOW (ref 12.0–15.0)
Lymphocytes Relative: 9 %
Lymphs Abs: 1.1 10*3/uL (ref 0.7–4.0)
MCH: 28.7 pg (ref 26.0–34.0)
MCHC: 31.7 g/dL (ref 30.0–36.0)
MCV: 90.4 fL (ref 80.0–100.0)
Metamyelocytes Relative: 1 %
Monocytes Absolute: 0.1 10*3/uL (ref 0.1–1.0)
Monocytes Relative: 1 %
Neutro Abs: 10.8 10*3/uL — ABNORMAL HIGH (ref 1.7–7.7)
Neutrophils Relative %: 86 %
Platelets: 233 10*3/uL (ref 150–400)
RBC: 3.14 MIL/uL — ABNORMAL LOW (ref 3.87–5.11)
RDW: 16.2 % — ABNORMAL HIGH (ref 11.5–15.5)
WBC: 12.6 10*3/uL — ABNORMAL HIGH (ref 4.0–10.5)
nRBC: 0.2 % (ref 0.0–0.2)

## 2023-01-29 LAB — BASIC METABOLIC PANEL WITH GFR
Anion gap: 7 (ref 5–15)
BUN: 20 mg/dL (ref 8–23)
CO2: 26 mmol/L (ref 22–32)
Calcium: 8.8 mg/dL — ABNORMAL LOW (ref 8.9–10.3)
Chloride: 107 mmol/L (ref 98–111)
Creatinine, Ser: 0.91 mg/dL (ref 0.44–1.00)
GFR, Estimated: >60 mL/min
Glucose, Bld: 120 mg/dL — ABNORMAL HIGH (ref 70–99)
Potassium: 3 mmol/L — ABNORMAL LOW (ref 3.5–5.1)
Sodium: 140 mmol/L (ref 135–145)

## 2023-01-29 MED ORDER — SERTRALINE HCL 100 MG PO TABS
100.0000 mg | ORAL_TABLET | Freq: Every day | ORAL | Status: DC
  Administered 2023-01-29 – 2023-02-03 (×6): 100 mg via ORAL
  Filled 2023-01-29 (×6): qty 1

## 2023-01-29 MED ORDER — GABAPENTIN 300 MG PO CAPS
300.0000 mg | ORAL_CAPSULE | Freq: Every day | ORAL | Status: DC
  Administered 2023-01-29 – 2023-02-02 (×5): 300 mg via ORAL
  Filled 2023-01-29 (×5): qty 1

## 2023-01-29 MED ORDER — METHYLPHENIDATE HCL 5 MG PO TABS
20.0000 mg | ORAL_TABLET | Freq: Two times a day (BID) | ORAL | Status: DC
  Administered 2023-01-31 – 2023-02-03 (×6): 20 mg via ORAL
  Filled 2023-01-29 (×6): qty 4
  Filled 2023-01-29: qty 1
  Filled 2023-01-29: qty 4

## 2023-01-29 MED ORDER — BUPROPION HCL ER (XL) 300 MG PO TB24
300.0000 mg | ORAL_TABLET | Freq: Every day | ORAL | Status: DC
  Administered 2023-01-29 – 2023-02-03 (×6): 300 mg via ORAL
  Filled 2023-01-29 (×6): qty 1

## 2023-01-29 MED ORDER — POTASSIUM CHLORIDE CRYS ER 20 MEQ PO TBCR
30.0000 meq | EXTENDED_RELEASE_TABLET | Freq: Two times a day (BID) | ORAL | Status: AC
  Administered 2023-01-29 (×2): 30 meq via ORAL
  Filled 2023-01-29 (×2): qty 1

## 2023-01-29 NOTE — Care Management Important Message (Signed)
Important Message  Patient Details IM Letter given. Name: Sydney Pace MRN: [id number] Date of Birth: February 20, 1956   Medicare Important Message Given:  Yes     [name] 01/29/2023, 2:41 PM

## 2023-01-29 NOTE — Progress Notes (Signed)
[facility]    Date of Admission:  01/24/2023   Total days of antibiotics 6/day 2 meropenem   ID: Sydney Pace is a 67 y.o. female with achromobacter bacteremia and cellulitis Principal Problem:   Cellulitis Active Problems:   Multiple sclerosis (HCC)   Essential hypertension   Hyperlipidemia, mixed   Attention deficit disorder (ADD) in adult   Wound infection    Subjective: Afebrile. Slightly less pain to right leg, but both still remain swollen  Medications:   buPROPion  300 mg Oral Daily   enoxaparin (LOVENOX) injection  40 mg Subcutaneous Q24H   furosemide  20 mg Oral Daily   gabapentin  300 mg Oral QHS   loratadine  10 mg Oral Daily   [START ON 01/30/2023] methylphenidate  20 mg Oral BID WC   potassium chloride  30 mEq Oral BID   sertraline  100 mg Oral Daily    Objective: Vital signs in last 24 hours: Temp:  [97.6 F (36.4 C)-98.2 F (36.8 C)] 98.2 F (36.8 C) (08/30 1321) Pulse Rate:  [78-81] 81 (08/30 1321) Resp:  [19] 19 (08/30 0436) BP: (131-135)/(64-68) 131/68 (08/30 1321) SpO2:  [98 %-100 %] 100 % (08/30 1321) Physical Exam  Constitutional:  oriented to person, place, and time. appears well-developed and well-nourished. No distress.  HENT: [place]/AT, PERRLA, no scleral icterus Mouth/Throat: Oropharynx is clear and moist. No oropharyngeal exudate.  Cardiovascular: Normal rate, regular rhythm and normal heart sounds. Exam reveals no gallop and no friction rub.  No murmur heard.  Pulmonary/Chest: Effort normal and breath sounds normal. No respiratory distress.  has no wheezes.  Neck = supple, no nuchal rigidity Ext: slightly less erythema to right leg. +1 edema still present Lymphadenopathy: no cervical adenopathy. No axillary adenopathy Neurological: alert and oriented to person, place, and time.  Skin: Skin is warm and dry. No rash noted. No erythema.  Psychiatric: a normal mood and affect.  behavior is normal.    Lab  Results Recent Labs    01/28/23 0622 01/29/23 0408  WBC 17.6* 12.6*  HGB 9.9* 9.0*  HCT 31.3* 28.4*  NA 135 140  K 3.0* 3.0*  CL 103 107  CO2 23 26  BUN 19 20  CREATININE 1.05* 0.91   Liver Panel No results for input(s): "PROT", "ALBUMIN", "AST", "ALT", "ALKPHOS", "BILITOT", "BILIDIR", "IBILI" in the last 72 hours. Sedimentation Rate No results for input(s): "ESRSEDRATE" in the last 72 hours. C-Reactive Protein No results for input(s): "CRP" in the last 72 hours.  Microbiology: reviewed Studies/Results: ECHOCARDIOGRAM COMPLETE  Result Date: 01/28/2023    ECHOCARDIOGRAM REPORT   Patient Name:   Sydney Pace Date of Exam: 01/28/2023 Medical Rec #:  [id number]      Height:       67.0 in Accession #:    [id number]     Weight:       289.9 lb Date of Birth:  [birth year]       BSA:          2.368 m Patient Age:    67 years       BP:           137/79 mmHg Patient Gender: F              HR:           80 bpm. Exam Location:  Inpatient Procedure: 2D Echo, Color Doppler and Cardiac Doppler Indications:     Bacteremia  History:  Patient has no prior history of Echocardiogram examinations.                  Signs/Symptoms:Edema; Risk Factors:Hypertension and                  Dyslipidemia.  Sonographer:     Milda Smart Referring Phys:  [id number] QIONGE DAHAL Diagnosing Phys: Weston Brass MD  Sonographer Comments: Suboptimal parasternal window. Image acquisition challenging due to patient body habitus and Image acquisition challenging due to respiratory motion. Definity not used due to lack of IV access. IMPRESSIONS  1. Left ventricular ejection fraction, by estimation, is 60 to 65%. The left ventricle has normal function. The left ventricle has no regional wall motion abnormalities. Left ventricular diastolic parameters were normal.  2. Right ventricular systolic function is normal. The right ventricular size is normal. Tricuspid regurgitation signal is inadequate for assessing PA pressure.  3.  The mitral valve is normal in structure. No evidence of mitral valve regurgitation. No evidence of mitral stenosis.  4. The aortic valve is tricuspid. Aortic valve regurgitation is not visualized. No aortic stenosis is present.  5. The inferior vena cava is dilated in size with >50% respiratory variability, suggesting right atrial pressure of 8 mmHg. Conclusion(s)/Recommendation(s): No evidence of valvular vegetations on this transthoracic echocardiogram. Consider a transesophageal echocardiogram to exclude infective endocarditis if clinically indicated. FINDINGS  Left Ventricle: Left ventricular ejection fraction, by estimation, is 60 to 65%. The left ventricle has normal function. The left ventricle has no regional wall motion abnormalities. The left ventricular internal cavity size was normal in size. There is  borderline left ventricular hypertrophy. Left ventricular diastolic parameters were normal. Right Ventricle: The right ventricular size is normal. No increase in right ventricular wall thickness. Right ventricular systolic function is normal. Tricuspid regurgitation signal is inadequate for assessing PA pressure. Left Atrium: Left atrial size was normal in size. Right Atrium: Right atrial size was normal in size. Pericardium: There is no evidence of pericardial effusion. Mitral Valve: The mitral valve is normal in structure. No evidence of mitral valve regurgitation. No evidence of mitral valve stenosis. Tricuspid Valve: The tricuspid valve is normal in structure. Tricuspid valve regurgitation is trivial. No evidence of tricuspid stenosis. Aortic Valve: The aortic valve is tricuspid. Aortic valve regurgitation is not visualized. No aortic stenosis is present. Pulmonic Valve: The pulmonic valve was normal in structure. Pulmonic valve regurgitation is not visualized. No evidence of pulmonic stenosis. Aorta: The aortic root is normal in size and structure. Venous: The inferior vena cava is dilated in size  with greater than 50% respiratory variability, suggesting right atrial pressure of 8 mmHg. IAS/Shunts: No atrial level shunt detected by color flow Doppler.  LEFT VENTRICLE PLAX 2D LVIDd:         3.40 cm     Diastology LVIDs:         2.90 cm     LV e' medial:    7.29 cm/s LV PW:         1.00 cm     LV E/e' medial:  12.0 LV IVS:        0.90 cm     LV e' lateral:   8.70 cm/s LVOT diam:     2.20 cm     LV E/e' lateral: 10.0 LV SV:         110 LV SV Index:   46 LVOT Area:     3.80 cm  LV Volumes (MOD) LV vol d, MOD  A4C: 84.6 ml LV vol s, MOD A4C: 29.7 ml LV SV MOD A4C:     84.6 ml RIGHT VENTRICLE             IVC RV S prime:     11.50 cm/s  IVC diam: 2.60 cm TAPSE (M-mode): 2.0 cm LEFT ATRIUM             Index        RIGHT ATRIUM           Index LA diam:        3.00 cm 1.27 cm/m   RA Area:     10.50 cm LA Vol (A2C):   68.5 ml 28.93 ml/m  RA Volume:   17.60 ml  7.43 ml/m LA Vol (A4C):   59.9 ml 25.30 ml/m LA Biplane Vol: 65.2 ml 27.54 ml/m  AORTIC VALVE LVOT Vmax:   174.00 cm/s LVOT Vmean:  97.100 cm/s LVOT VTI:    0.289 m  AORTA Ao Root diam: 2.90 cm Ao Asc diam:  3.20 cm MITRAL VALVE MV Area (PHT): 3.28 cm    SHUNTS MV Decel Time: 231 msec    Systemic VTI:  0.29 m MV E velocity: 87.40 cm/s  Systemic Diam: 2.20 cm MV A velocity: 74.10 cm/s MV E/A ratio:  1.18 Weston Brass MD Electronically signed by Weston Brass MD Signature Date/Time: 01/28/2023/11:32:47 PM    Final (Updated)      Assessment/Plan: Achromobacter bacteremia and cellulitis = continue with meropenem, appears to be responding to abtx Less erythema  Leukocytosis = slowly trending downward back to baseline  Pedal edema = continue with wrapping and elevated legs  [facility] Pager: [phone]  01/29/2023, 3:29 PM

## 2023-01-29 NOTE — Plan of Care (Signed)

## 2023-01-29 NOTE — Progress Notes (Signed)
PROGRESS NOTE  Sydney Pace  DOB: July 05, 1955  PCP: Bradd Canary, MD ZOX:[id number]  DOA: 01/24/2023  LOS: 4 days  Hospital Day: 6  Brief narrative: Sydney Pace is a 67 y.o. female with PMH significant for morbid obesity, multiple sclerosis, HTN, GERD, hypothyroidism, depression, constipation who lives alone. 8/25, patient family went to check on her as they had not been able to contact her lately.  They found her confused, sitting on the toilet and brought to the ED  In the ED, patient had a fever of 100.7, blood pressure 171/76, breathing room air She was somnolent, disoriented She was noted to have extensive erythema and swelling of right lower extremity up to the groin.  Ulcerative skin on both lower extremity with drainage. Labs showed WBC count elevated to 16.2, lactic acid elevated to 2.2, CK level 1652 Respiratory virus panel unremarkable Urinalysis showed hazy yellow urine with large amount of hemoglobin, no bacteria nitrates or leukocytes Blood culture was sent CT head unremarkable for acute findings CT right lower extremity showed subcutaneous edema in the distal thigh and lower leg consistent with cellulitis.  No evidence of focal collection, foreign body or emphysema or osteomyelitis. Started on IV antibiotics, IV fluid Admitted to [facility] Wound care consulted  Subjective: Patient was seen and examined this morning.   Lying down in bed.  Not in distress.  Feels better.  Family not at bedside today.  No fever in last 24 hours.  Assessment and plan: Severe sepsis POA Extensive Cellulitis RLE  B/L LE ulcerations Bacteremia with Staph epidermidis and Achromobacter xylosoxidans Met severe sepsis criteria with fever, leukocytosis, lactic acidosis, altered mental status, source of infection Clinical and radiographic evidence of cellulitis without abscess Blood culture grew Staph epidermidis and Achromobacter xylosoxidans on all bottles. 8/29, ID consultation was  obtained.  Antibiotics switched from IV Zosyn to IV meropenem. Repeat blood culture sent.  Transthoracic echocardiogram without vegetation Cellulitis improving.  WBC count improving.  Local nursing wound care to continue Recent Labs  Lab 01/24/23 2240 01/24/23 2253 01/25/23 0343 01/27/23 0416 01/28/23 0622 01/29/23 0408  WBC 16.2*  --  14.2* 15.7* 17.6* 12.6*  LATICACIDVEN  --  2.2* 1.3  --   --   --    Acute metabolic encephalopathy H/o ADHD, depression Patient was significantly altered on presentation secondary to infection.  Mental status gradually improving. PTA meds-Abilify, Zoloft, bupropion, Neurontin, Ritalin Initially held because of altered mental status.  At patient's request, I resumed them today. Continue to monitor mental status change  Hypokalemia Potassium low at 3 today.  Oral replacement ordered.  Recheck. Recent Labs  Lab 01/24/23 2240 01/25/23 0343 01/27/23 0416 01/28/23 0622 01/29/23 0408  K 3.4* 3.4* 3.1* 3.0* 3.0*  MG  --  2.0  --   --   --     Mild rhabdomyolysis Due to being in the same position for long time. CK level was elevated to 1600s.  Adequately hydrated.   Recent Labs  Lab 01/24/23 2240 01/27/23 0416  CKTOTAL 1,652* 281*    Mild chronic anemia No active bleeding.  Hemoglobin mostly stable.  Continue to monitor Recent Labs    01/24/23 2240 01/25/23 0343 01/27/23 0416 01/28/23 0622 01/29/23 0408  HGB 10.9* 10.0* 9.9* 9.9* 9.0*  MCV 90.4 92.8 90.9 92.6 90.4   Essential hypertension PTA on lisinopril/HCTZ and Lasix PRN BP meds were held. Initially required IV fluid 8/28, patient was noted to have volume retention.  Lasix 20 mg daily was started. Blood  pressure stable  H/o multiple sclerosis generalized weakness Impaired mobility CT head negative for new lesions. PT eval obtained.  SNF recommended.  Goiter CT chest showed diffuse enlarged thyroid slightly larger than last seen in 2010.  No underlying adenopathy.   Radiologist also reported that thyroid has been previously evaluated with ultrasound and biopsy   Pulmonary nodules Per CT chest, patient had stable left upper lobe nodule from 2010. No further follow-up is recommended.  Goals of care   Code Status: Full Code     DVT prophylaxis:  enoxaparin (LOVENOX) injection 40 mg Start: 01/25/23 1000   Antimicrobials: IV meropenem Fluid: None Consultants: None Family Communication: Family not at bedside  Status: Inpatient Level of care:  Telemetry   Patient is from: Home Needs to continue in-hospital care: Antibiotics changed to IV meropenem Anticipated d/c to: Pending clinical course.  Ultimate plan to discharge to SNF    Diet:  Diet Order             Diet regular Room service appropriate? Yes; Fluid consistency: Thin  Diet effective now                   Scheduled Meds:  buPROPion  300 mg Oral Daily   enoxaparin (LOVENOX) injection  40 mg Subcutaneous Q24H   furosemide  20 mg Oral Daily   gabapentin  300 mg Oral QHS   loratadine  10 mg Oral Daily   [START ON 01/30/2023] methylphenidate  20 mg Oral BID WC   potassium chloride  30 mEq Oral BID   sertraline  100 mg Oral Daily    PRN meds: acetaminophen **OR** acetaminophen, ondansetron **OR** ondansetron (ZOFRAN) IV, polyvinyl alcohol, senna-docusate   Infusions:   meropenem (MERREM) IV 1 g (01/29/23 1317)    Antimicrobials: Anti-infectives (From admission, onward)    Start     Dose/Rate Route Frequency Ordered Stop   01/28/23 2000  meropenem (MERREM) 1 g in sodium chloride 0.9 % 100 mL IVPB        1 g 200 mL/hr over 30 Minutes Intravenous Every 8 hours 01/28/23 2005     01/28/23 1515  meropenem (MERREM) 1 g in sodium chloride 0.9 % 100 mL IVPB  Status:  Discontinued        1 g 200 mL/hr over 30 Minutes Intravenous Every 8 hours 01/28/23 1428 01/28/23 2005   01/28/23 1045  meropenem (MERREM) 1 g in sodium chloride 0.9 % 100 mL IVPB  Status:  Discontinued        1  g 200 mL/hr over 30 Minutes Intravenous Every 8 hours 01/28/23 0955 01/28/23 1428   01/25/23 0600  piperacillin-tazobactam (ZOSYN) IVPB 3.375 g  Status:  Discontinued        3.375 g 12.5 mL/hr over 240 Minutes Intravenous Every 8 hours 01/25/23 0025 01/28/23 0955   01/24/23 2230  vancomycin (VANCOREADY) IVPB 2000 mg/400 mL        2,000 mg 200 mL/hr over 120 Minutes Intravenous  Once 01/24/23 2216 01/25/23 0200   01/24/23 2215  ceFEPIme (MAXIPIME) 2 g in sodium chloride 0.9 % 100 mL IVPB        2 g 200 mL/hr over 30 Minutes Intravenous  Once 01/24/23 2210 01/25/23 0104   01/24/23 2215  metroNIDAZOLE (FLAGYL) IVPB 500 mg        500 mg 100 mL/hr over 60 Minutes Intravenous  Once 01/24/23 2210 01/25/23 0104   01/24/23 2215  vancomycin (VANCOCIN) IVPB 1000 mg/200 mL premix  Status:  Discontinued        1,000 mg 200 mL/hr over 60 Minutes Intravenous  Once 01/24/23 2210 01/24/23 2216       Nutritional status:  Body mass index is 45.41 kg/m.          Objective: Vitals:   01/29/23 0442 01/29/23 1321  BP: 135/64 131/68  Pulse: 78 81  Resp:    Temp: 97.6 F (36.4 C) 98.2 F (36.8 C)  SpO2: 98% 100%    Intake/Output Summary (Last 24 hours) at 01/29/2023 1343 Last data filed at 01/29/2023 1229 Gross per 24 hour  Intake 559.16 ml  Output 1950 ml  Net -1390.84 ml   Filed Weights   01/24/23 2200  Weight: 131.5 kg   Weight change:  Body mass index is 45.41 kg/m.   Physical Exam: General exam: Pleasant elderly African-American female.  Not in physical distress Skin: No rashes, lesions or ulcers. HEENT: Atraumatic, normocephalic, no obvious bleeding Lungs: Clear to auscultation bilaterally CVS: Regular rate and rhythm, no murmur GI/Abd soft nontender, nondistended, bowel sound present CNS: Alert, awake, slow to respond but oriented x 3. Psychiatry: Mood appropriate. Extremities: Mild pedal edema on the left.  Right lower extremity cellulitis improving.  Data Review: I  have personally reviewed the laboratory data and studies available.  F/u labs ordered Unresulted Labs (From admission, onward)     Start     Ordered   02/01/23 0500  Creatinine, serum  (enoxaparin (LOVENOX)    CrCl >/= 30 ml/min)  Weekly,   R     Comments: while on enoxaparin therapy    01/25/23 0017            Total time spent in review of labs and imaging, patient evaluation, formulation of plan, documentation and communication appointment oriented x 3 family: 45 minutes  Signed, Lorin Glass, MD [facility] 01/29/2023

## 2023-01-30 DIAGNOSIS — R7881 Bacteremia: Secondary | ICD-10-CM | POA: Diagnosis not present

## 2023-01-30 NOTE — Progress Notes (Signed)
PROGRESS NOTE    Sydney Pace  XBM:[id number]  DOB: 1955-10-09  DOA: 01/24/2023 PCP: Bradd Canary, MD Outpatient Specialists:   Hospital course:  67 year old female with MS, morbid obesity, HTN, hypothyroidism was admitted with altered mental status and fever to 100.7.  Workup revealed ulcerative skin lesions on both lower extremities and extensive erythema on top of her baseline edema.  Patient met severe sepsis criteria on admission and blood cultures subsequently grew out Staph epidermidis and Achromobacter xylosoxidans.  Patient is being followed by infectious disease and is now on meropenem.  Subjective:  Patient states that she hopes she is getting better.  She does feel better.  Denies feeling confused, does know that she is in the hospital because of "a rash in my legs".  As she lives home alone and notes the closest person to her is her niece Sydney Pace.   Objective: Vitals:   01/29/23 1321 01/29/23 2032 01/30/23 0406 01/30/23 1240  BP: 131/68 139/69 (!) 140/68 136/60  Pulse: 81 79 85 79  Resp:  20 18 16   Temp: 98.2 F (36.8 C) 98.7 F (37.1 C) 97.6 F (36.4 C) 99.4 F (37.4 C)  TempSrc: Oral Oral Oral Oral  SpO2: 100% 99% 97% 96%  Weight:      Height:        Intake/Output Summary (Last 24 hours) at 01/30/2023 1703 Last data filed at 01/30/2023 1020 Gross per 24 hour  Intake 1020 ml  Output 1080 ml  Net -60 ml   Filed Weights   01/24/23 2200  Weight: 131.5 kg     Exam:  General: Patient in good spirits sitting up in NAD Eyes: sclera anicteric, conjuctiva mild injection bilaterally CVS: S1-S2, regular  Respiratory:  decreased air entry bilaterally secondary to decreased inspiratory effort, rales at bases  GI: NABS, soft, NT  LE: Brawny lower extremity edema bilaterally, legs are wrapped bilaterally.  Less erythema with an area of outline on thigh. Neuro: A/O x 3,  grossly nonfocal.  Psych: patient is logical and coherent, judgement and insight appear  normal, mood and affect appropriate to situation.  Data Reviewed:  Basic Metabolic Panel: Recent Labs  Lab 01/24/23 2240 01/25/23 0343 01/27/23 0416 01/28/23 0622 01/29/23 0408  NA 134* 138 136 135 140  K 3.4* 3.4* 3.1* 3.0* 3.0*  CL 104 109 104 103 107  CO2 23 20* 20* 23 26  GLUCOSE 171* 131* 114* 108* 120*  BUN 22 21 18 19 20   CREATININE 1.19* 1.03* 1.03* 1.05* 0.91  CALCIUM 9.3 9.2 9.0 9.0 8.8*  MG  --  2.0  --   --   --     CBC: Recent Labs  Lab 01/24/23 2240 01/25/23 0343 01/27/23 0416 01/28/23 0622 01/29/23 0408  WBC 16.2* 14.2* 15.7* 17.6* 12.6*  NEUTROABS 15.0* 12.6* 13.7* 13.3* 10.8*  HGB 10.9* 10.0* 9.9* 9.9* 9.0*  HCT 33.9* 31.1* 31.0* 31.3* 28.4*  MCV 90.4 92.8 90.9 92.6 90.4  PLT 206 162 200 235 233     Scheduled Meds:  buPROPion  300 mg Oral Daily   enoxaparin (LOVENOX) injection  40 mg Subcutaneous Q24H   furosemide  20 mg Oral Daily   gabapentin  300 mg Oral QHS   loratadine  10 mg Oral Daily   methylphenidate  20 mg Oral BID WC   sertraline  100 mg Oral Daily   Continuous Infusions:  meropenem (MERREM) IV 1 g (01/30/23 1434)     Assessment & Plan:   RLE  soft tissue infection including ulceration/cellulitis Bacteremia with Staph epidermidis and Achromobacter xylosoxidans Bilateral lower extremity edema/venous stasis disease Sepsis physiology has resolved Very much appreciate ID consultation Started switched from Zosyn to meropenem yesterday Repeat blood cultures are ordered and pending TTE WNL Patient is on Lasix to help with lower extremity edema although it appears to be mostly venous stasis disease given normal EF on echo.  Patient will need outpatient Unna boots after discharge to keep LE edema down and prevent recurrence of cellulitis  Hypokalemia Recheck in the morning, patient received repletion yesterday  HTN Under good control off of her lisinopril/hydrochlorothiazide She is on Lasix for LE edema as noted  above  Disposition H/O MS with generalized weakness Impaired mobility Appreciate PT evaluation, recommendation is for SNF Discussed with patient's niece Sydney Pace who agrees  Goiter Pulmonary nodules Large goiter noted, stable since 2010 LUL nodule noted stable since 2010, no further workup is recommended  AKI Solved  Mild rhabdomyolysis CK decreased markedly with hydration  Metabolic encephalopathy Resolved   DVT prophylaxis: Lovenox Code Status: Full Family Communication: Spoke with patient's niece Sydney Pace   Studies: No results found.  Principal Problem:   Cellulitis Active Problems:   Multiple sclerosis (HCC)   Essential hypertension   Hyperlipidemia, mixed   Attention deficit disorder (ADD) in adult   Wound infection     Sydney Pace Tublu Karryn Kosinski, [facility]  If 7PM-7AM, please contact night-coverage [email or web address]   LOS: 5 days

## 2023-01-31 DIAGNOSIS — L03115 Cellulitis of right lower limb: Secondary | ICD-10-CM | POA: Diagnosis not present

## 2023-01-31 LAB — BASIC METABOLIC PANEL
Anion gap: 7 (ref 5–15)
BUN: 13 mg/dL (ref 8–23)
CO2: 28 mmol/L (ref 22–32)
Calcium: 8.9 mg/dL (ref 8.9–10.3)
Chloride: 103 mmol/L (ref 98–111)
Creatinine, Ser: 0.92 mg/dL (ref 0.44–1.00)
GFR, Estimated: >60 mL/min (ref >60)
Glucose, Bld: 87 mg/dL (ref 70–99)
Potassium: 3.7 mmol/L (ref 3.5–5.1)
Sodium: 138 mmol/L (ref 135–145)

## 2023-01-31 NOTE — Plan of Care (Signed)
  Problem: Clinical Measurements: Goal: Diagnostic test results will improve Outcome: Progressing Goal: Respiratory complications will improve Outcome: Progressing Goal: Cardiovascular complication will be avoided Outcome: Progressing   Problem: Safety: Goal: Ability to remain free from injury will improve Outcome: Progressing   

## 2023-01-31 NOTE — Progress Notes (Signed)
PROGRESS NOTE    Sydney Pace  ZOX:[id number]  DOB: 23-Mar-1956  DOA: 01/24/2023 PCP: Sydney Canary, MD Outpatient Specialists:   Hospital course:  67 year old female with MS, morbid obesity, HTN, hypothyroidism was admitted with altered mental status and fever to 100.7.  Workup revealed ulcerative skin lesions on both lower extremities and extensive erythema on top of her baseline edema.  Patient met severe sepsis criteria on admission and blood cultures subsequently grew out Staph epidermidis and Achromobacter xylosoxidans.  Patient is being followed by infectious disease and is now on meropenem.  Subjective:  Patient states that she feels "completely normal".  Notes that yesterday she was still a little confused and a little sleepy but right now she feels like she is back to her baseline.  She is wondering what she can do to prevent the cellulitis again and also what to do about her leg swelling.   Objective: Vitals:   01/30/23 1240 01/30/23 2006 01/31/23 0432 01/31/23 1356  BP: 136/60 (!) 150/69 133/63 (!) 133/57  Pulse: 79 76 72 73  Resp: 16 16 20 18   Temp: 99.4 F (37.4 C) 99.1 F (37.3 C) 98.4 F (36.9 C) 98.1 F (36.7 C)  TempSrc: Oral Oral Oral Oral  SpO2: 96% 97% 97% 98%  Weight:      Height:        Intake/Output Summary (Last 24 hours) at 01/31/2023 1703 Last data filed at 01/31/2023 1407 Gross per 24 hour  Intake 387.92 ml  Output 1775 ml  Net -1387.08 ml   Filed Weights   01/24/23 2200  Weight: 131.5 kg     Exam:  General: Patient in good spirits sitting up in NAD Eyes: sclera anicteric, conjuctiva mild injection bilaterally CVS: S1-S2, regular  Respiratory:  decreased air entry bilaterally secondary to decreased inspiratory effort, rales at bases  GI: NABS, soft, NT  LE: Brawny lower extremity edema bilaterally, legs are wrapped bilaterally.  Less erythema with an area of outline on thigh. Neuro: A/O x 3,  grossly nonfocal.  Psych: patient is  logical and coherent, judgement and insight appear normal, mood and affect appropriate to situation.  Data Reviewed:  Basic Metabolic Panel: Recent Labs  Lab 01/25/23 0343 01/27/23 0416 01/28/23 0622 01/29/23 0408 01/31/23 0414  NA 138 136 135 140 138  K 3.4* 3.1* 3.0* 3.0* 3.7  CL 109 104 103 107 103  CO2 20* 20* 23 26 28   GLUCOSE 131* 114* 108* 120* 87  BUN 21 18 19 20 13   CREATININE 1.03* 1.03* 1.05* 0.91 0.92  CALCIUM 9.2 9.0 9.0 8.8* 8.9  MG 2.0  --   --   --   --     CBC: Recent Labs  Lab 01/24/23 2240 01/25/23 0343 01/27/23 0416 01/28/23 0622 01/29/23 0408  WBC 16.2* 14.2* 15.7* 17.6* 12.6*  NEUTROABS 15.0* 12.6* 13.7* 13.3* 10.8*  HGB 10.9* 10.0* 9.9* 9.9* 9.0*  HCT 33.9* 31.1* 31.0* 31.3* 28.4*  MCV 90.4 92.8 90.9 92.6 90.4  PLT 206 162 200 235 233     Scheduled Meds:  buPROPion  300 mg Oral Daily   enoxaparin (LOVENOX) injection  40 mg Subcutaneous Q24H   furosemide  20 mg Oral Daily   gabapentin  300 mg Oral QHS   loratadine  10 mg Oral Daily   methylphenidate  20 mg Oral BID WC   sertraline  100 mg Oral Daily   Continuous Infusions:  meropenem (MERREM) IV 1 g (01/31/23 1405)  Assessment & Plan:   RLE soft tissue infection including ulceration/cellulitis Bacteremia with Staph epidermidis and Achromobacter xylosoxidans Bilateral lower extremity edema/venous stasis disease Sepsis physiology has resolved Very much appreciate ID consultation Switched to meropenem from Zosyn on 8/30 Repeat blood cultures are ordered and pending TTE WNL Patient is on Lasix to help with lower extremity edema although it appears to be mostly venous stasis disease given normal EF on echo.  Patient will need outpatient Unna boots after discharge to keep LE edema down and prevent recurrence of cellulitis  Hypokalemia Resolved with repletion  HTN Under good control off of her lisinopril/hydrochlorothiazide She is on Lasix for LE edema as noted  above  Disposition H/O MS with generalized weakness Impaired mobility Appreciate PT evaluation, recommendation is for SNF Discussed with patient's niece Sydney Pace who agrees  Goiter Pulmonary nodules Large goiter noted, stable since 2010 LUL nodule noted stable since 2010, no further workup is recommended  AKI Resolved  Mild rhabdomyolysis CK decreased markedly with hydration  Metabolic encephalopathy Resolved   DVT prophylaxis: Lovenox Code Status: Full Family Communication: Spoke with patient's niece Sydney Pace   Studies: No results found.  Principal Problem:   Cellulitis Active Problems:   Multiple sclerosis (HCC)   Essential hypertension   Hyperlipidemia, mixed   Attention deficit disorder (ADD) in adult   Wound infection     Sydney Pace, [facility]  If 7PM-7AM, please contact night-coverage [email or web address]   LOS: 6 days

## 2023-02-01 DIAGNOSIS — L03115 Cellulitis of right lower limb: Secondary | ICD-10-CM | POA: Diagnosis not present

## 2023-02-01 LAB — CBC WITH DIFFERENTIAL/PLATELET
Abs Immature Granulocytes: 0.72 10*3/uL — ABNORMAL HIGH (ref 0.00–0.07)
Basophils Absolute: 0.1 10*3/uL (ref 0.0–0.1)
Basophils Relative: 1 %
Eosinophils Absolute: 0.2 10*3/uL (ref 0.0–0.5)
Eosinophils Relative: 2 %
HCT: 32.1 % — ABNORMAL LOW (ref 36.0–46.0)
Hemoglobin: 10.3 g/dL — ABNORMAL LOW (ref 12.0–15.0)
Immature Granulocytes: 6 %
Lymphocytes Relative: 12 %
Lymphs Abs: 1.6 10*3/uL (ref 0.7–4.0)
MCH: 28.9 pg (ref 26.0–34.0)
MCHC: 32.1 g/dL (ref 30.0–36.0)
MCV: 90.2 fL (ref 80.0–100.0)
Monocytes Absolute: 0.7 10*3/uL (ref 0.1–1.0)
Monocytes Relative: 6 %
Neutro Abs: 9.9 10*3/uL — ABNORMAL HIGH (ref 1.7–7.7)
Neutrophils Relative %: 73 %
Platelets: 335 10*3/uL (ref 150–400)
RBC: 3.56 MIL/uL — ABNORMAL LOW (ref 3.87–5.11)
RDW: 16 % — ABNORMAL HIGH (ref 11.5–15.5)
WBC: 13.2 10*3/uL — ABNORMAL HIGH (ref 4.0–10.5)
nRBC: 0.2 % (ref 0.0–0.2)

## 2023-02-01 LAB — BASIC METABOLIC PANEL
Anion gap: 9 (ref 5–15)
BUN: 14 mg/dL (ref 8–23)
CO2: 28 mmol/L (ref 22–32)
Calcium: 9.3 mg/dL (ref 8.9–10.3)
Chloride: 101 mmol/L (ref 98–111)
Creatinine, Ser: 0.93 mg/dL (ref 0.44–1.00)
GFR, Estimated: >60 mL/min (ref >60)
Glucose, Bld: 99 mg/dL (ref 70–99)
Potassium: 3.8 mmol/L (ref 3.5–5.1)
Sodium: 138 mmol/L (ref 135–145)

## 2023-02-01 LAB — CREATININE, SERUM
Creatinine, Ser: 0.81 mg/dL (ref 0.44–1.00)
GFR, Estimated: >60 mL/min (ref >60)

## 2023-02-01 NOTE — Plan of Care (Signed)

## 2023-02-01 NOTE — Plan of Care (Signed)
  Problem: Education: Goal: Knowledge of General Education information will improve Description: Including pain rating scale, medication(s)/side effects and non-pharmacologic comfort measures Outcome: Progressing   Problem: Health Behavior/Discharge Planning: Goal: Ability to manage health-related needs will improve Outcome: Progressing   Problem: Clinical Measurements: Goal: Ability to maintain clinical measurements within normal limits will improve Outcome: Progressing   Problem: Activity: Goal: Risk for activity intolerance will decrease Outcome: Not Progressing   Problem: Elimination: Goal: Will not experience complications related to bowel motility Outcome: Not Progressing

## 2023-02-01 NOTE — Progress Notes (Signed)
PROGRESS NOTE  Sydney Pace  DOB: [birth year]  PCP: Bradd Canary, MD WUJ:[id number]  DOA: 01/24/2023  LOS: 7 days  Hospital Day: 9  Brief narrative: Sydney Pace is a 67 y.o. female with PMH significant for morbid obesity, multiple sclerosis, HTN, GERD, hypothyroidism, depression, constipation who lives alone. 8/25, patient family went to check on her as they had not been able to contact her lately.  They found her confused, sitting on the toilet and brought to the ED  In the ED, patient had a fever of 100.7, blood pressure 171/76, breathing room air She was somnolent, disoriented She was noted to have extensive erythema and swelling of right lower extremity up to the groin.  Ulcerative skin on both lower extremity with drainage. Labs showed WBC count elevated to 16.2, lactic acid elevated to 2.2, CK level 1652 Respiratory virus panel unremarkable Urinalysis showed hazy yellow urine with large amount of hemoglobin, no bacteria nitrates or leukocytes Blood culture was sent CT head unremarkable for acute findings CT right lower extremity showed subcutaneous edema in the distal thigh and lower leg consistent with cellulitis.  No evidence of focal collection, foreign body or emphysema or osteomyelitis. Started on IV antibiotics, IV fluid Admitted to [facility] Wound care consulted  Subjective: Patient was seen and examined this morning.  Lying on bed.  Not in distress.  Mental status intact. Remains on IV meropenem.  Assessment and plan: Severe sepsis POA Extensive Cellulitis RLE  B/L LE ulcerations Bacteremia with Staph epidermidis and Achromobacter xylosoxidans Met severe sepsis criteria with fever, leukocytosis, lactic acidosis, altered mental status, source of infection Clinical and radiographic evidence of cellulitis without abscess Blood culture grew Staph epidermidis and Achromobacter xylosoxidans on all bottles. 8/29, ID consultation was obtained.  Antibiotics switched from  IV Zosyn to IV meropenem. Repeat blood culture 8/29 did not show any growth..  Transthoracic echocardiogram did not show any vegetation Cellulitis improving.  WBC count improving.  Local nursing wound care to continue ID plans to decide tomorrow if patient needs PICC line at discharge or can switch to oral antibiotics. Recent Labs  Lab 01/27/23 0416 01/28/23 0622 01/29/23 0408  WBC 15.7* 17.6* 12.6*   Acute metabolic encephalopathy H/o ADHD, depression Patient was significantly altered on presentation secondary to infection.  Mental status gradually improving. PTA meds-Abilify, Zoloft, bupropion, Neurontin, Ritalin Initially held because of altered mental status.  Gradually resumed. Continue to monitor mental status change  Hypokalemia Potassium level improved with replacement. Recent Labs  Lab 01/27/23 0416 01/28/23 0622 01/29/23 0408 01/31/23 0414  K 3.1* 3.0* 3.0* 3.7    Mild rhabdomyolysis Due to being in the same position for long time. CK level was elevated to 1600s.  Adequately hydrated.   Recent Labs  Lab 01/27/23 0416  CKTOTAL 281*    Mild chronic anemia No active bleeding.  Hemoglobin mostly stable.  Continue to monitor Recent Labs    01/24/23 2240 01/25/23 0343 01/27/23 0416 01/28/23 0622 01/29/23 0408  HGB 10.9* 10.0* 9.9* 9.9* 9.0*  MCV 90.4 92.8 90.9 92.6 90.4   Essential hypertension PTA on lisinopril/HCTZ and Lasix PRN BP meds were held. Initially required IV fluid 8/28, patient was noted to have volume retention.  Lasix 20 mg daily was started. Blood pressure stable  H/o multiple sclerosis generalized weakness Impaired mobility CT head negative for new lesions. PT eval obtained.  SNF recommended.  Goiter CT chest showed diffuse enlarged thyroid slightly larger than last seen in 2010.  No underlying adenopathy.  Radiologist  also reported that thyroid has been previously evaluated with ultrasound and biopsy   Pulmonary nodules Per CT  chest, patient had stable left upper lobe nodule from 2010. No further follow-up is recommended.  Goals of care   Code Status: Full Code     DVT prophylaxis:  enoxaparin (LOVENOX) injection 40 mg Start: 01/25/23 1000   Antimicrobials: IV meropenem Fluid: None Consultants: None Family Communication: Family not at bedside  Status: Inpatient Level of care:  Telemetry   Patient is from: Home Needs to continue in-hospital care: IV meropenem Anticipated d/c to: IV antibiotics through PICC line versus oral antibiotics at discharge    Diet:  Diet Order             Diet regular Room service appropriate? Yes; Fluid consistency: Thin  Diet effective now                   Scheduled Meds:  buPROPion  300 mg Oral Daily   enoxaparin (LOVENOX) injection  40 mg Subcutaneous Q24H   furosemide  20 mg Oral Daily   gabapentin  300 mg Oral QHS   loratadine  10 mg Oral Daily   methylphenidate  20 mg Oral BID WC   sertraline  100 mg Oral Daily    PRN meds: acetaminophen **OR** acetaminophen, ondansetron **OR** ondansetron (ZOFRAN) IV, polyvinyl alcohol, senna-docusate   Infusions:   meropenem (MERREM) IV 1 g (02/01/23 1552)    Antimicrobials: Anti-infectives (From admission, onward)    Start     Dose/Rate Route Frequency Ordered Stop   01/28/23 2000  meropenem (MERREM) 1 g in sodium chloride 0.9 % 100 mL IVPB        1 g 200 mL/hr over 30 Minutes Intravenous Every 8 hours 01/28/23 2005     01/28/23 1515  meropenem (MERREM) 1 g in sodium chloride 0.9 % 100 mL IVPB  Status:  Discontinued        1 g 200 mL/hr over 30 Minutes Intravenous Every 8 hours 01/28/23 1428 01/28/23 2005   01/28/23 1045  meropenem (MERREM) 1 g in sodium chloride 0.9 % 100 mL IVPB  Status:  Discontinued        1 g 200 mL/hr over 30 Minutes Intravenous Every 8 hours 01/28/23 0955 01/28/23 1428   01/25/23 0600  piperacillin-tazobactam (ZOSYN) IVPB 3.375 g  Status:  Discontinued        3.375 g 12.5 mL/hr  over 240 Minutes Intravenous Every 8 hours 01/25/23 0025 01/28/23 0955   01/24/23 2230  vancomycin (VANCOREADY) IVPB 2000 mg/400 mL        2,000 mg 200 mL/hr over 120 Minutes Intravenous  Once 01/24/23 2216 01/25/23 0200   01/24/23 2215  ceFEPIme (MAXIPIME) 2 g in sodium chloride 0.9 % 100 mL IVPB        2 g 200 mL/hr over 30 Minutes Intravenous  Once 01/24/23 2210 01/25/23 0104   01/24/23 2215  metroNIDAZOLE (FLAGYL) IVPB 500 mg        500 mg 100 mL/hr over 60 Minutes Intravenous  Once 01/24/23 2210 01/25/23 0104   01/24/23 2215  vancomycin (VANCOCIN) IVPB 1000 mg/200 mL premix  Status:  Discontinued        1,000 mg 200 mL/hr over 60 Minutes Intravenous  Once 01/24/23 2210 01/24/23 2216       Nutritional status:  Body mass index is 45.41 kg/m.          Objective: Vitals:   02/01/23 0529 02/01/23 1511  BP: (!) 157/80 (!) 155/66  Pulse: 78 77  Resp: 17 14  Temp: 98.3 F (36.8 C) 98.4 F (36.9 C)  SpO2: 93% 97%    Intake/Output Summary (Last 24 hours) at 02/01/2023 1629 Last data filed at 02/01/2023 1517 Gross per 24 hour  Intake 800 ml  Output 3300 ml  Net -2500 ml   Filed Weights   01/24/23 2200  Weight: 131.5 kg   Weight change:  Body mass index is 45.41 kg/m.   Physical Exam: General exam: Pleasant elderly African-American female.  Not in physical distress Skin: No rashes, lesions or ulcers. HEENT: Atraumatic, normocephalic, no obvious bleeding Lungs: Clear to auscultation bilaterally CVS: Regular rate and rhythm, no murmur GI/Abd soft nontender, nondistended, bowel sound present CNS: Alert, awake, slow to respond but oriented x 3. Psychiatry: Mood appropriate. Extremities: Right lower EXTR cellulitis much improved  Data Review: I have personally reviewed the laboratory data and studies available.  F/u labs ordered Unresulted Labs (From admission, onward)     Start     Ordered   02/01/23 1604  CBC with Differential/Platelet  Once,   R         02/01/23 1603   02/01/23 1604  Basic metabolic panel  Once,   R        02/01/23 1603   02/01/23 0500  Creatinine, serum  (enoxaparin (LOVENOX)    CrCl >/= 30 ml/min)  Weekly,   R     Comments: while on enoxaparin therapy    01/25/23 0017            Total time spent in review of labs and imaging, patient evaluation, formulation of plan, documentation and communication appointment oriented x 3 family: 45 minutes  Signed, Lorin Glass, MD [facility] 02/01/2023

## 2023-02-01 NOTE — Progress Notes (Signed)
Occupational Therapy Treatment Patient Details Name: Angeliah Pace MRN: [id number] DOB: 1955-10-27 Today's Date: 02/01/2023   History of present illness 67 yo female admitted with AMS, R LE cellulitis, severe sepsis. Hx of bil chronic LE wounds-goes to wound center, morbid obesity, multiple sclerosis, orthostatic hypotension, depression   OT comments  Patient was able to participate in BUE strengthening exercises sitting EOB with no LOB with yellow theraband. Patient preformed bed mobility with CGA with increased time to advance to sitting upright EOB. Patient is motivated to participate but continues to have decreased functional activity tolerance and increased pain. Patient would continue to benefit from skilled OT services at this time while admitted and after d/c to address noted deficits in order to improve overall safety and independence in ADLs.        If plan is discharge home, recommend the following:  Two people to help with walking and/or transfers;A lot of help with bathing/dressing/bathroom;Assistance with cooking/housework;Assist for transportation;Help with stairs or ramp for entrance   Equipment Recommendations  None recommended by OT       Precautions / Restrictions Precautions Precautions: Fall Precaution Comments: bil LE wounds Restrictions Weight Bearing Restrictions: No              ADL either performed or assessed with clinical judgement   ADL Overall ADL's : Needs assistance/impaired     Grooming: Wash/dry face;Sitting;Set up Grooming Details (indicate cue type and reason): EOB              Cognition Arousal: Alert Behavior During Therapy: WFL for tasks assessed/performed Overall Cognitive Status: Within Functional Limits for tasks assessed              Exercises Other Exercises Other Exercises: patient was educated on using stress ball. patient demonstrated understanding. Other Exercises: patient was educated on using yellow theraband for  shoulder and elbow strengthening. patient completed 10 reps of each task with increased time on all three planes. need further review in next session.    Shoulder Instructions       General Comments      Pertinent Vitals/ Pain       Pain Assessment Pain Assessment: 0-10 Pain Score: 6  Pain Location: R LE Pain Descriptors / Indicators: Discomfort, Aching Pain Intervention(s): Limited activity within patient's tolerance, Monitored during session, Repositioned         Frequency  Min 1X/week        Progress Toward Goals  OT Goals(current goals can now be found in the care plan section)  Progress towards OT goals: Progressing toward goals     Plan      Co-evaluation    PT/OT/SLP Co-Evaluation/Treatment: Yes Reason for Co-Treatment: For patient/therapist safety PT goals addressed during session: Mobility/safety with mobility OT goals addressed during session: ADL's and self-care      AM-PAC OT "6 Clicks" Daily Activity     Outcome Measure   Help from another person eating meals?: None Help from another person taking care of personal grooming?: A Little Help from another person toileting, which includes using toliet, bedpan, or urinal?: A Lot Help from another person bathing (including washing, rinsing, drying)?: A Lot Help from another person to put on and taking off regular upper body clothing?: A Lot Help from another person to put on and taking off regular lower body clothing?: A Lot 6 Click Score: 15    End of Session Equipment Utilized During Treatment: Gait belt;Rolling walker (2 wheels) (wide walker)  OT Visit Diagnosis:  Unsteadiness on feet (R26.81);Other abnormalities of gait and mobility (R26.89);Muscle weakness (generalized) (M62.81);Pain   Activity Tolerance Patient tolerated treatment well   Patient Left in bed;with call bell/phone within reach;with bed alarm set   Nurse Communication Mobility status;Need for lift equipment;Precautions         Time: 2694-8546 OT Time Calculation (min): 31 min  Charges: OT General Charges $OT Visit: 1 Visit OT Treatments $Therapeutic Activity: 8-22 mins  Sydney Loud, MS Acute Rehabilitation Department Office# [phone]   Sydney Pace 02/01/2023, 4:02 PM

## 2023-02-01 NOTE — Progress Notes (Signed)
Physical Therapy Treatment Patient Details Name: Sydney Pace MRN: [id number] DOB: 06-30-1955 Today's Date: 02/01/2023   History of Present Illness 67 yo female admitted with AMS, R LE cellulitis, severe sepsis. Hx of bil chronic LE wounds-goes to wound center, morbid obesity, multiple sclerosis, orthostatic hypotension, depression    PT Comments  Pt agreeable to working with therapy. Mobility is progressing. Pt reports she is feeling better. Still having pain in LEs. Fatigues fairly easily with activity. She is motivated to regain her independence/PLOF. Patient will benefit from continued inpatient follow up therapy, <3 hours/day     If plan is discharge home, recommend the following: Direct supervision/assist for financial management;Assist for transportation;Help with stairs or ramp for entrance;A lot of help with walking and/or transfers;A lot of help with bathing/dressing/bathroom   Can travel by private vehicle     No  Equipment Recommendations  None recommended by PT    Recommendations for Other Services       Precautions / Restrictions Precautions Precautions: Fall Precaution Comments: bil LE wounds Restrictions Weight Bearing Restrictions: No     Mobility  Bed Mobility Overal bed mobility: Needs Assistance Bed Mobility: Supine to Sit, Sit to Supine     Supine to sit: Supervision, Used rails, HOB elevated Sit to supine: Min assist, HOB elevated, Used rails   General bed mobility comments: Increased time and encouragement for pt to do as much for herself as she could. Min A for LEs back onto bed. Increased time.    Transfers Overall transfer level: Needs assistance Equipment used: Rolling walker (2 wheels) Transfers: Sit to/from Stand Sit to Stand: Mod assist, +2 physical assistance, +2 safety/equipment, From elevated surface           General transfer comment: Assist to power up, steady, control descent. Cues for safety, technique, hand/feet placement.  Increased time and effort for pt.    Ambulation/Gait Min Assist; +2 safety/equipment  Gait Distance (Feet): 12 Feet Assistive device: Rolling walker (2 wheels) Gait Pattern/deviations: Step-to pattern       General Gait Details: Assist to stabilize pt throughout distance. Close follow with recliner. Ambulation distance limited by fatigue, LE pain, general weakness. Fall risk.   Stairs             Wheelchair Mobility     Tilt Bed    Modified Rankin (Stroke Patients Only)       Balance Overall balance assessment: Needs assistance         Standing balance support: Reliant on assistive device for balance, Bilateral upper extremity supported, During functional activity Standing balance-Leahy Scale: Poor                              Cognition Arousal: Alert Behavior During Therapy: WFL for tasks assessed/performed Overall Cognitive Status: Within Functional Limits for tasks assessed                                          Exercises      General Comments        Pertinent Vitals/Pain Pain Assessment Pain Assessment: 0-10 Pain Score: 6  Pain Location: R LE Pain Descriptors / Indicators: Discomfort, Aching Pain Intervention(s): Limited activity within patient's tolerance, Monitored during session, Repositioned    Home Living  Prior Function            PT Goals (current goals can now be found in the care plan section) Progress towards PT goals: Progressing toward goals    Frequency    Min 1X/week      PT Plan      Co-evaluation              AM-PAC PT "6 Clicks" Mobility   Outcome Measure  Help needed turning from your back to your side while in a flat bed without using bedrails?: A Lot Help needed moving from lying on your back to sitting on the side of a flat bed without using bedrails?: A Lot Help needed moving to and from a bed to a chair (including a wheelchair)?:  A Lot Help needed standing up from a chair using your arms (e.g., wheelchair or bedside chair)?: A Lot Help needed to walk in hospital room?: A Lot Help needed climbing 3-5 steps with a railing? : Total 6 Click Score: 11    End of Session Equipment Utilized During Treatment: Gait belt Activity Tolerance: Patient limited by fatigue;Patient limited by pain Patient left: in bed;with call bell/phone within reach;with bed alarm set   PT Visit Diagnosis: Muscle weakness (generalized) (M62.81);Difficulty in walking, not elsewhere classified (R26.2);Pain;Other abnormalities of gait and mobility (R26.89);Unsteadiness on feet (R26.81) Pain - part of body: Leg     Time: 5284-1324 PT Time Calculation (min) (ACUTE ONLY): 31 min  Charges:    $Gait Training: 8-22 mins PT General Charges $$ ACUTE PT VISIT: 1 Visit                         Faye Ramsay, PT Acute Rehabilitation  Office: [phone]

## 2023-02-01 NOTE — Plan of Care (Signed)
  Problem: Education: Goal: Knowledge of General Education information will improve Description: Including pain rating scale, medication(s)/side effects and non-pharmacologic comfort measures Outcome: Progressing   Problem: Health Behavior/Discharge Planning: Goal: Ability to manage health-related needs will improve Outcome: Progressing   Problem: Clinical Measurements: Goal: Ability to maintain clinical measurements within normal limits will improve Outcome: Progressing   Problem: Skin Integrity: Goal: Risk for impaired skin integrity will decrease Outcome: Progressing   Problem: Activity: Goal: Risk for activity intolerance will decrease Outcome: Not Progressing

## 2023-02-01 NOTE — TOC Progression Note (Addendum)
Transition of Care [facility]) - Progression Note    Patient Details  Name: Sydney Pace MRN: [id number] Date of Birth: [birth year]  Transition of Care [facility]) CM/SW Contact  Princella Ion, LCSW Phone Number: 02/01/2023, 2:31 PM  Clinical Narrative:    CSW followed up with [employer] this morning regarding insurance auth. Rep that is covering for [facility] informed they'd check. Awaiting response. CSW attempted to contact by phone without success. Will follow for call back.   Addend @ 2:40 PM Rep reported they have been unable to find any information regarding authorization. They will defer to [place] when she returns in the AM. TOC to follow.    Expected Discharge Plan: Skilled Nursing Facility Barriers to Discharge: Continued Medical Work up  Expected Discharge Plan and Services In-house Referral: Clinical Social Work     Living arrangements for the past 2 months: Single Family Home                                       Social Determinants of Health (SDOH) Interventions SDOH Screenings   Food Insecurity: Food Insecurity Present (12/10/2022)  Housing: Patient Unable To Answer (12/10/2022)  Transportation Needs: No Transportation Needs (12/10/2022)  Utilities: Not At Risk (12/10/2022)  Alcohol Screen: Low Risk  (12/10/2022)  Depression (PHQ2-9): Low Risk  (12/31/2022)  Recent Concern: Depression (PHQ2-9) - Medium Risk (12/10/2022)  Financial Resource Strain: Low Risk  (11/10/2021)  Physical Activity: Inactive (12/10/2022)  Social Connections: Socially Isolated (11/10/2021)  Stress: No Stress Concern Present (11/10/2021)  Tobacco Use: Low Risk  (01/25/2023)    Readmission Risk Interventions     No data to display

## 2023-02-01 NOTE — Progress Notes (Signed)
ID PROGRESS NOTE   Achromobacter wounds with bacteremia.  Will plan to see if can do picc line tomorrow to see if can treat at home vs. Convert over to oral abtx Will check cbc with diff and bmp today Continue with wound care  More recs in the morning  Sydney Pace B. Drue Second MD MPH [facility] [phone]

## 2023-02-02 DIAGNOSIS — L03115 Cellulitis of right lower limb: Secondary | ICD-10-CM | POA: Diagnosis not present

## 2023-02-02 LAB — CULTURE, BLOOD (ROUTINE X 2)
Culture: NO GROWTH
Culture: NO GROWTH
Special Requests: ADEQUATE
Special Requests: ADEQUATE

## 2023-02-02 MED ORDER — SULFAMETHOXAZOLE-TRIMETHOPRIM 800-160 MG PO TABS
2.0000 | ORAL_TABLET | Freq: Two times a day (BID) | ORAL | Status: DC
  Administered 2023-02-02 – 2023-02-03 (×3): 2 via ORAL
  Filled 2023-02-02 (×3): qty 2

## 2023-02-02 NOTE — Progress Notes (Addendum)
PROGRESS NOTE  Sydney Pace  DOB: 31-May-1956  PCP: Bradd Canary, MD UUV:[id number]  DOA: 01/24/2023  LOS: 8 days  Hospital Day: 10  Brief narrative: Sydney Pace is a 67 y.o. female with PMH significant for morbid obesity, multiple sclerosis, HTN, GERD, hypothyroidism, depression, constipation who lives alone. 8/25, patient family went to check on her as they had not been able to contact her lately.  They found her confused, sitting on the toilet and brought to the ED  In the ED, patient had a fever of 100.7, blood pressure 171/76, breathing room air She was somnolent, disoriented She was noted to have extensive erythema and swelling of right lower extremity up to the groin.  Ulcerative skin on both lower extremity with drainage. Labs showed WBC count elevated to 16.2, lactic acid elevated to 2.2, CK level 1652 Respiratory virus panel unremarkable Urinalysis showed hazy yellow urine with large amount of hemoglobin, no bacteria nitrates or leukocytes Blood culture was sent CT head unremarkable for acute findings CT right lower extremity showed subcutaneous edema in the distal thigh and lower leg consistent with cellulitis.  No evidence of focal collection, foreign body or emphysema or osteomyelitis. Started on IV antibiotics, IV fluid Admitted to [facility] Wound care consulted  Subjective: Patient was seen and examined this morning.  Sitting up in recliner.  Not in distress. On IV meropenem. No new symptoms.  Assessment and plan: Severe sepsis POA Extensive Cellulitis RLE  B/L LE ulcerations Bacteremia with Staph epidermidis and Achromobacter xylosoxidans Met severe sepsis criteria with fever, leukocytosis, lactic acidosis, altered mental status, source of infection Clinical and radiographic evidence of cellulitis without abscess Blood culture grew Staph epidermidis and Achromobacter xylosoxidans on all bottles. 8/29, ID consultation was obtained.  Antibiotics switched from IV  Zosyn to IV meropenem. Repeat blood culture 8/29 did not show any growth..  Transthoracic echocardiogram did not show any vegetation Cellulitis improving.  WBC count improving.  Local nursing wound care to continue Noted plan from ID to switch from IV meropenem to oral Bactrim and monitor. Recent Labs  Lab 01/27/23 0416 01/28/23 0622 01/29/23 0408 02/01/23 1622  WBC 15.7* 17.6* 12.6* 13.2*   Acute metabolic encephalopathy H/o ADHD, depression Patient was significantly altered on presentation secondary to infection.  Mental status gradually improving. PTA meds-Abilify, Zoloft, bupropion, Neurontin, Ritalin Initially held because of altered mental status.  Gradually resumed. Continue to monitor mental status change  Hypokalemia Potassium level improved with replacement. Recent Labs  Lab 01/27/23 0416 01/28/23 0622 01/29/23 0408 01/31/23 0414 02/01/23 1622  K 3.1* 3.0* 3.0* 3.7 3.8    Mild rhabdomyolysis Due to being in the same position for long time. CK level was elevated to 1600s.  Adequately hydrated.   Recent Labs  Lab 01/27/23 0416  CKTOTAL 281*    Mild chronic anemia No active bleeding.  Hemoglobin mostly stable.  Continue to monitor Recent Labs    01/25/23 0343 01/27/23 0416 01/28/23 0622 01/29/23 0408 02/01/23 1622  HGB 10.0* 9.9* 9.9* 9.0* 10.3*  MCV 92.8 90.9 92.6 90.4 90.2   Essential hypertension PTA on lisinopril/HCTZ and Lasix PRN BP meds were held. Initially required IV fluid 8/28, patient was noted to have volume retention.  Lasix 20 mg daily was started. Blood pressure stable  H/o multiple sclerosis generalized weakness Impaired mobility CT head negative for new lesions. PT eval obtained.  SNF recommended.  Goiter CT chest showed diffuse enlarged thyroid slightly larger than last seen in 2010.  No underlying adenopathy.  Radiologist also reported that thyroid has been previously evaluated with ultrasound and biopsy   Pulmonary  nodules Per CT chest, patient had stable left upper lobe nodule from 2010. No further follow-up is recommended.  Urinary incontinence  Patient states she has an appointment with [facility].  I called and check to confirm it.  She has an appointment on September 11 at 9 AM with Dr. Jorje Guild  Goals of care   Code Status: Full Code     DVT prophylaxis:  enoxaparin (LOVENOX) injection 40 mg Start: 01/25/23 1000   Antimicrobials: IV meropenem Fluid: None Consultants: None Family Communication: Family not at bedside  Status: Inpatient Level of care:  Telemetry   Patient is from: Home Needs to continue in-hospital care: Oral Bactrim started.  To repeat labs tomorrow. Anticipated d/c to: Hopefully SNF tomorrow if authorized    Diet:  Diet Order             Diet regular Room service appropriate? Yes; Fluid consistency: Thin  Diet effective now                   Scheduled Meds:  buPROPion  300 mg Oral Daily   enoxaparin (LOVENOX) injection  40 mg Subcutaneous Q24H   furosemide  20 mg Oral Daily   gabapentin  300 mg Oral QHS   loratadine  10 mg Oral Daily   methylphenidate  20 mg Oral BID WC   sertraline  100 mg Oral Daily   sulfamethoxazole-trimethoprim  2 tablet Oral Q12H    PRN meds: acetaminophen **OR** acetaminophen, ondansetron **OR** ondansetron (ZOFRAN) IV, polyvinyl alcohol, senna-docusate   Infusions:     Antimicrobials: Anti-infectives (From admission, onward)    Start     Dose/Rate Route Frequency Ordered Stop   02/02/23 1300  sulfamethoxazole-trimethoprim (BACTRIM DS) 800-160 MG per tablet 2 tablet        2 tablet Oral Every 12 hours 02/02/23 1210     01/28/23 2000  meropenem (MERREM) 1 g in sodium chloride 0.9 % 100 mL IVPB  Status:  Discontinued        1 g 200 mL/hr over 30 Minutes Intravenous Every 8 hours 01/28/23 2005 02/02/23 1210   01/28/23 1515  meropenem (MERREM) 1 g in sodium chloride 0.9 % 100 mL IVPB  Status:  Discontinued         1 g 200 mL/hr over 30 Minutes Intravenous Every 8 hours 01/28/23 1428 01/28/23 2005   01/28/23 1045  meropenem (MERREM) 1 g in sodium chloride 0.9 % 100 mL IVPB  Status:  Discontinued        1 g 200 mL/hr over 30 Minutes Intravenous Every 8 hours 01/28/23 0955 01/28/23 1428   01/25/23 0600  piperacillin-tazobactam (ZOSYN) IVPB 3.375 g  Status:  Discontinued        3.375 g 12.5 mL/hr over 240 Minutes Intravenous Every 8 hours 01/25/23 0025 01/28/23 0955   01/24/23 2230  vancomycin (VANCOREADY) IVPB 2000 mg/400 mL        2,000 mg 200 mL/hr over 120 Minutes Intravenous  Once 01/24/23 2216 01/25/23 0200   01/24/23 2215  ceFEPIme (MAXIPIME) 2 g in sodium chloride 0.9 % 100 mL IVPB        2 g 200 mL/hr over 30 Minutes Intravenous  Once 01/24/23 2210 01/25/23 0104   01/24/23 2215  metroNIDAZOLE (FLAGYL) IVPB 500 mg        500 mg 100 mL/hr over 60 Minutes Intravenous  Once 01/24/23 2210  01/25/23 0104   01/24/23 2215  vancomycin (VANCOCIN) IVPB 1000 mg/200 mL premix  Status:  Discontinued        1,000 mg 200 mL/hr over 60 Minutes Intravenous  Once 01/24/23 2210 01/24/23 2216       Nutritional status:  Body mass index is 45.41 kg/m.          Objective: Vitals:   02/02/23 0433 02/02/23 1248  BP: 136/79 126/72  Pulse: 80 81  Resp:  18  Temp: 98 F (36.7 C) 98.5 F (36.9 C)  SpO2: 100% 98%    Intake/Output Summary (Last 24 hours) at 02/02/2023 1542 Last data filed at 02/02/2023 0600 Gross per 24 hour  Intake 540 ml  Output --  Net 540 ml   Filed Weights   01/24/23 2200  Weight: 131.5 kg   Weight change:  Body mass index is 45.41 kg/m.   Physical Exam: General exam: Pleasant elderly African-American female.  Not in physical distress Skin: No rashes, lesions or ulcers. HEENT: Atraumatic, normocephalic, no obvious bleeding Lungs: Clear to auscultation bilaterally CVS: Regular rate and rhythm, no murmur GI/Abd soft nontender, nondistended, bowel sound present CNS:  Alert, awake, slow to respond but oriented x 3. Psychiatry: Mood appropriate. Extremities: Right lower EXTR cellulitis much improved.  Both legs remain edematous.  Data Review: I have personally reviewed the laboratory data and studies available.  F/u labs ordered Unresulted Labs (From admission, onward)     Start     Ordered   02/03/23 0500  Basic metabolic panel  Tomorrow morning,   R        02/02/23 1210   02/01/23 0500  Creatinine, serum  (enoxaparin (LOVENOX)    CrCl >/= 30 ml/min)  Weekly,   R     Comments: while on enoxaparin therapy    01/25/23 0017            Total time spent in review of labs and imaging, patient evaluation, formulation of plan, documentation and communication appointment oriented x 3 family: 25 minutes  Signed, Lorin Glass, MD [facility] 02/02/2023

## 2023-02-02 NOTE — Progress Notes (Signed)
Mobility Specialist - Progress Note   02/02/23 1130  Mobility  Activity Transferred to/from [facility];Ambulated with assistance in room  Level of Assistance Moderate assist, patient does 50-74%  Assistive Device BSC;Front wheel walker  Distance Ambulated (ft) 18 ft  Range of Motion/Exercises Active Assistive  Activity Response Tolerated fair  $Mobility charge 1 Mobility  Mobility Specialist Start Time (ACUTE ONLY) 1050  Mobility Specialist Stop Time (ACUTE ONLY) 1130  Mobility Specialist Time Calculation (min) (ACUTE ONLY) 40 min   Pt was found in bed and agreeable to ambulate. Transfer to [facility] prior to ambulating in room. Was mod-A for STS. Stated having more pain on R leg than L left. At EOS was left in bed with all needs met. NT in room.  Billey Chang Mobility Specialist

## 2023-02-02 NOTE — TOC Progression Note (Signed)
Transition of Care [facility]) - Progression Note    Patient Details  Name: Sydney Pace MRN: [id number] Date of Birth: [birth year]  Transition of Care [facility]) CM/SW Contact  Larrie Kass, LCSW Phone Number: 02/02/2023, 9:25 AM  Clinical Narrative:    Facility has started insurance auth today auth pending.TOC to follow.   Expected Discharge Plan: Skilled Nursing Facility Barriers to Discharge: Continued Medical Work up  Expected Discharge Plan and Services In-house Referral: Clinical Social Work     Living arrangements for the past 2 months: Single Family Home                                       Social Determinants of Health (SDOH) Interventions SDOH Screenings   Food Insecurity: Food Insecurity Present (12/10/2022)  Housing: Patient Unable To Answer (12/10/2022)  Transportation Needs: No Transportation Needs (12/10/2022)  Utilities: Not At Risk (12/10/2022)  Alcohol Screen: Low Risk  (12/10/2022)  Depression (PHQ2-9): Low Risk  (12/31/2022)  Recent Concern: Depression (PHQ2-9) - Medium Risk (12/10/2022)  Financial Resource Strain: Low Risk  (11/10/2021)  Physical Activity: Inactive (12/10/2022)  Social Connections: Socially Isolated (11/10/2021)  Stress: No Stress Concern Present (11/10/2021)  Tobacco Use: Low Risk  (01/25/2023)    Readmission Risk Interventions     No data to display

## 2023-02-02 NOTE — Progress Notes (Addendum)
    [facility]    Date of Admission:  01/24/2023   Total days of antibiotics 6   ID: Sydney Pace is a 67 y.o. female with  achromobacter bacteremia and chronic leg wounds Principal Problem:   Cellulitis Active Problems:   Multiple sclerosis (HCC)   Essential hypertension   Hyperlipidemia, mixed   Attention deficit disorder (ADD) in adult   Wound infection    Subjective: Afebrile. Feeling less swelling to lower extremities  Medications:   buPROPion  300 mg Oral Daily   enoxaparin (LOVENOX) injection  40 mg Subcutaneous Q24H   furosemide  20 mg Oral Daily   gabapentin  300 mg Oral QHS   loratadine  10 mg Oral Daily   methylphenidate  20 mg Oral BID WC   sertraline  100 mg Oral Daily   sulfamethoxazole-trimethoprim  2 tablet Oral Q12H    Objective: Vital signs in last 24 hours: Temp:  [98 F (36.7 C)-98.9 F (37.2 C)] 98.5 F (36.9 C) (09/03 1248) Pulse Rate:  [77-81] 81 (09/03 1248) Resp:  [14-18] 18 (09/03 1248) BP: (126-155)/(62-79) 126/72 (09/03 1248) SpO2:  [97 %-100 %] 98 % (09/03 1248)  Physical Exam  Constitutional:  oriented to person, place, and time. appears well-developed and well-nourished. No distress.  HENT: [place]/AT, PERRLA, no scleral icterus Mouth/Throat: Oropharynx is clear and moist. No oropharyngeal exudate.  Cardiovascular: Normal rate, regular rhythm and normal heart sounds. Exam reveals no gallop and no friction rub.  No murmur heard.  Pulmonary/Chest: Effort normal and breath sounds normal. No respiratory distress.  has no wheezes.  Neck = supple, no nuchal rigidity Abdominal: Soft. Bowel sounds are normal.  exhibits no distension. There is no tenderness.  Lymphadenopathy: no cervical adenopathy. No axillary adenopathy Neurological: alert and oriented to person, place, and time.  Skin: Skin is warm and dry. No rash noted. No erythema.  Ext: bilateral lower legs wrapped Psychiatric: a normal mood and affect.   behavior is normal.    Lab Results Recent Labs    01/31/23 0414 02/01/23 0431 02/01/23 1622  WBC  --   --  13.2*  HGB  --   --  10.3*  HCT  --   --  32.1*  NA 138  --  138  K 3.7  --  3.8  CL 103  --  101  CO2 28  --  28  BUN 13  --  14  CREATININE 0.92 0.81 0.93    Microbiology: reviewed Studies/Results: No results found.   Assessment/Plan: Achromobacter bacteremia = will switch to bactrim ds 2 tabs bid to see if she tolerates it. Will follow renal function tomorrow. Will need 7 days plus please check bmp in 3-4 days while on bactrim.  Continue with wound care recs, and still needs to follow up with wound care as outpatient  [facility] Pager: [phone]  02/02/2023, 1:14 PM

## 2023-02-03 DIAGNOSIS — L97929 Non-pressure chronic ulcer of unspecified part of left lower leg with unspecified severity: Secondary | ICD-10-CM | POA: Diagnosis not present

## 2023-02-03 DIAGNOSIS — K219 Gastro-esophageal reflux disease without esophagitis: Secondary | ICD-10-CM | POA: Diagnosis not present

## 2023-02-03 DIAGNOSIS — M1990 Unspecified osteoarthritis, unspecified site: Secondary | ICD-10-CM | POA: Diagnosis not present

## 2023-02-03 DIAGNOSIS — R109 Unspecified abdominal pain: Secondary | ICD-10-CM | POA: Diagnosis not present

## 2023-02-03 DIAGNOSIS — R600 Localized edema: Secondary | ICD-10-CM

## 2023-02-03 DIAGNOSIS — R531 Weakness: Secondary | ICD-10-CM | POA: Diagnosis not present

## 2023-02-03 DIAGNOSIS — E559 Vitamin D deficiency, unspecified: Secondary | ICD-10-CM | POA: Diagnosis not present

## 2023-02-03 DIAGNOSIS — L97811 Non-pressure chronic ulcer of other part of right lower leg limited to breakdown of skin: Secondary | ICD-10-CM | POA: Diagnosis not present

## 2023-02-03 DIAGNOSIS — R32 Unspecified urinary incontinence: Secondary | ICD-10-CM | POA: Diagnosis not present

## 2023-02-03 DIAGNOSIS — R7881 Bacteremia: Secondary | ICD-10-CM | POA: Diagnosis not present

## 2023-02-03 DIAGNOSIS — R739 Hyperglycemia, unspecified: Secondary | ICD-10-CM | POA: Diagnosis not present

## 2023-02-03 DIAGNOSIS — N3281 Overactive bladder: Secondary | ICD-10-CM | POA: Diagnosis not present

## 2023-02-03 DIAGNOSIS — M6281 Muscle weakness (generalized): Secondary | ICD-10-CM | POA: Diagnosis not present

## 2023-02-03 DIAGNOSIS — Z7401 Bed confinement status: Secondary | ICD-10-CM | POA: Diagnosis not present

## 2023-02-03 DIAGNOSIS — F32A Depression, unspecified: Secondary | ICD-10-CM | POA: Diagnosis not present

## 2023-02-03 DIAGNOSIS — M1712 Unilateral primary osteoarthritis, left knee: Secondary | ICD-10-CM | POA: Diagnosis not present

## 2023-02-03 DIAGNOSIS — D72829 Elevated white blood cell count, unspecified: Secondary | ICD-10-CM

## 2023-02-03 DIAGNOSIS — I70238 Atherosclerosis of native arteries of right leg with ulceration of other part of lower right leg: Secondary | ICD-10-CM | POA: Diagnosis not present

## 2023-02-03 DIAGNOSIS — R2689 Other abnormalities of gait and mobility: Secondary | ICD-10-CM | POA: Diagnosis not present

## 2023-02-03 DIAGNOSIS — L03115 Cellulitis of right lower limb: Secondary | ICD-10-CM | POA: Diagnosis not present

## 2023-02-03 DIAGNOSIS — R41841 Cognitive communication deficit: Secondary | ICD-10-CM | POA: Diagnosis not present

## 2023-02-03 DIAGNOSIS — G4700 Insomnia, unspecified: Secondary | ICD-10-CM | POA: Diagnosis not present

## 2023-02-03 DIAGNOSIS — M1711 Unilateral primary osteoarthritis, right knee: Secondary | ICD-10-CM | POA: Diagnosis not present

## 2023-02-03 DIAGNOSIS — I10 Essential (primary) hypertension: Secondary | ICD-10-CM | POA: Diagnosis not present

## 2023-02-03 DIAGNOSIS — M170 Bilateral primary osteoarthritis of knee: Secondary | ICD-10-CM | POA: Diagnosis not present

## 2023-02-03 LAB — BASIC METABOLIC PANEL
Anion gap: 10 (ref 5–15)
BUN: 15 mg/dL (ref 8–23)
CO2: 27 mmol/L (ref 22–32)
Calcium: 9.5 mg/dL (ref 8.9–10.3)
Chloride: 103 mmol/L (ref 98–111)
Creatinine, Ser: 0.98 mg/dL (ref 0.44–1.00)
GFR, Estimated: >60 mL/min (ref >60)
Glucose, Bld: 88 mg/dL (ref 70–99)
Potassium: 3.9 mmol/L (ref 3.5–5.1)
Sodium: 140 mmol/L (ref 135–145)

## 2023-02-03 MED ORDER — SENNOSIDES-DOCUSATE SODIUM 8.6-50 MG PO TABS: 1.0000 | ORAL_TABLET | Freq: Every evening | ORAL | Status: DC | PRN

## 2023-02-03 MED ORDER — SULFAMETHOXAZOLE-TRIMETHOPRIM 800-160 MG PO TABS: 2.0000 | ORAL_TABLET | Freq: Two times a day (BID) | ORAL | Status: AC

## 2023-02-03 MED ORDER — METHYLPHENIDATE HCL 20 MG PO TABS: ORAL_TABLET | ORAL | 0 refills | Status: DC

## 2023-02-03 NOTE — TOC Transition Note (Signed)
Transition of Care [facility]) - CM/SW Discharge Note   Patient Details  Name: Sydney Pace MRN: [id number] Date of Birth: [birth year]  Transition of Care [facility]) CM/SW Contact:  Larrie Kass, LCSW Phone Number: 02/03/2023, 1:14 PM   Clinical Narrative:    Pt to d/c to [facility], spoke with pt's niece Ladona Ridgel she agrees with d/c plan. Pt's room 603b , call reports [phone] . No further, TOC needs, TOC sign off.    Final next level of care: Skilled Nursing Facility Barriers to Discharge: No Barriers Identified   Patient Goals and CMS Choice CMS Medicare.gov Compare Post Acute Care list provided to:: Patient Choice offered to / list presented to : Patient  Discharge Placement                  Patient to be transferred to facility by: ems Name of family member notified: Janus,Taylor (Niece) [phone] (Mobile) Patient and family notified of of transfer: 02/03/23  Discharge Plan and Services Additional resources added to the After Visit Summary for   In-house Referral: Clinical Social Work                                   Social Determinants of Health (SDOH) Interventions SDOH Screenings   Food Insecurity: Food Insecurity Present (12/10/2022)  Housing: Patient Unable To Answer (12/10/2022)  Transportation Needs: No Transportation Needs (12/10/2022)  Utilities: Not At Risk (12/10/2022)  Alcohol Screen: Low Risk  (12/10/2022)  Depression (PHQ2-9): Low Risk  (12/31/2022)  Recent Concern: Depression (PHQ2-9) - Medium Risk (12/10/2022)  Financial Resource Strain: Low Risk  (11/10/2021)  Physical Activity: Inactive (12/10/2022)  Social Connections: Socially Isolated (11/10/2021)  Stress: No Stress Concern Present (11/10/2021)  Tobacco Use: Low Risk  (01/25/2023)     Readmission Risk Interventions     No data to display

## 2023-02-03 NOTE — TOC Progression Note (Addendum)
Transition of Care [facility]) - Progression Note    Patient Details  Name: Sydney Pace MRN: [id number] Date of Birth: June 05, 1955  Transition of Care [facility]) CM/SW Contact  Larrie Kass, LCSW Phone Number: 02/03/2023, 9:59 AM  Clinical Narrative:     Insurance auth still pending. TOC to follow.  Adden 1030am Received message from [place] with [employer] , pt's insurance Berkley Harvey was approved pt can admit today if stable. TOC TOC to follow.   Expected Discharge Plan: Skilled Nursing Facility   Expected Discharge Plan and Services In-house Referral: Clinical Social Work     Living arrangements for the past 2 months: Single Family Home                                       Social Determinants of Health (SDOH) Interventions SDOH Screenings   Food Insecurity: Food Insecurity Present (12/10/2022)  Housing: Patient Unable To Answer (12/10/2022)  Transportation Needs: No Transportation Needs (12/10/2022)  Utilities: Not At Risk (12/10/2022)  Alcohol Screen: Low Risk  (12/10/2022)  Depression (PHQ2-9): Low Risk  (12/31/2022)  Recent Concern: Depression (PHQ2-9) - Medium Risk (12/10/2022)  Financial Resource Strain: Low Risk  (11/10/2021)  Physical Activity: Inactive (12/10/2022)  Social Connections: Socially Isolated (11/10/2021)  Stress: No Stress Concern Present (11/10/2021)  Tobacco Use: Low Risk  (01/25/2023)    Readmission Risk Interventions     No data to display

## 2023-02-03 NOTE — Progress Notes (Signed)
Occupational Therapy Treatment Patient Details Name: Sydney Pace MRN: [id number] DOB: March 15, 1956 Today's Date: 02/03/2023   History of present illness 67 yo female admitted with AMS, R LE cellulitis, severe sepsis. Hx of bil chronic LE wounds-goes to wound center, morbid obesity, multiple sclerosis, orthostatic hypotension, depression   OT comments  Pt with report of fatigue and B LE pain, but willing to push through and ambulated in hall with chair follow. Pt requires elevated bed and 2 person assist to stand. Total assist for pericare. Pt with B LE wounds on lower legs and backside of thighs she is unable to access to perform wound care. Agreed to remain up in chair at end of session. Patient will benefit from continued inpatient follow up therapy, <3 hours/day as she lives alone.        If plan is discharge home, recommend the following:  Two people to help with walking and/or transfers;A lot of help with bathing/dressing/bathroom;Assistance with cooking/housework;Assist for transportation;Help with stairs or ramp for entrance   Equipment Recommendations  None recommended by OT    Recommendations for Other Services      Precautions / Restrictions Precautions Precautions: Fall Precaution Comments: bil lower leg and back of leg wounds Restrictions Weight Bearing Restrictions: No       Mobility Bed Mobility Overal bed mobility: Needs Assistance Bed Mobility: Supine to Sit     Supine to sit: Min assist     General bed mobility comments: assist to raise trunk    Transfers Overall transfer level: Needs assistance Equipment used: Rolling walker (2 wheels) Transfers: Sit to/from Stand Sit to Stand: From elevated surface, Mod assist, +2 physical assistance           General transfer comment: assist to rise and steady from elevated bed, requires 2 attempts prior to standing for pericare and ambulation in hall     Balance Overall balance assessment: Needs  assistance Sitting-balance support: Feet supported Sitting balance-Leahy Scale: Fair     Standing balance support: Reliant on assistive device for balance, Bilateral upper extremity supported, During functional activity Standing balance-Leahy Scale: Poor Standing balance comment: reliant on B UE support                           ADL either performed or assessed with clinical judgement   ADL Overall ADL's : Needs assistance/impaired     Grooming: Set up;Sitting                       Toileting- Clothing Manipulation and Hygiene: Total assistance;Sit to/from stand       Functional mobility during ADLs: Minimal assistance;+2 for safety/equipment;Rolling walker (2 wheels) General ADL Comments: Pt is aware she will be unable to care for her wounds if she returns home alone.    Extremity/Trunk Assessment              Vision       Perception     Praxis      Cognition Arousal: Alert Behavior During Therapy: WFL for tasks assessed/performed Overall Cognitive Status: Within Functional Limits for tasks assessed                                          Exercises      Shoulder Instructions       General Comments  Pertinent Vitals/ Pain       Pain Assessment Pain Assessment: Faces Faces Pain Scale: Hurts little more Pain Location: LEs Pain Descriptors / Indicators: Grimacing, Guarding, Discomfort Pain Intervention(s): Monitored during session, Repositioned  Home Living                                          Prior Functioning/Environment              Frequency  Min 1X/week        Progress Toward Goals  OT Goals(current goals can now be found in the care plan section)  Progress towards OT goals: Progressing toward goals  Acute Rehab OT Goals OT Goal Formulation: With patient Time For Goal Achievement: 02/10/23 Potential to Achieve Goals: Good  Plan      Co-evaluation    PT/OT/SLP  Co-Evaluation/Treatment: Yes Reason for Co-Treatment: For patient/therapist safety   OT goals addressed during session: ADL's and self-care      AM-PAC OT "6 Clicks" Daily Activity     Outcome Measure   Help from another person eating meals?: None Help from another person taking care of personal grooming?: A Little Help from another person toileting, which includes using toliet, bedpan, or urinal?: Total Help from another person bathing (including washing, rinsing, drying)?: A Lot Help from another person to put on and taking off regular upper body clothing?: A Little Help from another person to put on and taking off regular lower body clothing?: Total 6 Click Score: 14    End of Session Equipment Utilized During Treatment: Gait belt;Rolling walker (2 wheels)  OT Visit Diagnosis: Unsteadiness on feet (R26.81);Other abnormalities of gait and mobility (R26.89);Muscle weakness (generalized) (M62.81);Pain   Activity Tolerance Patient tolerated treatment well   Patient Left in chair;with call bell/phone within reach;with chair alarm set   Nurse Communication Mobility status;Other (comment) (needs purewick)        Time: 4540-9811 OT Time Calculation (min): 23 min  Charges: OT General Charges $OT Visit: 1 Visit OT Treatments $Self Care/Home Management : 8-22 mins  Berna Spare, OTR/L Acute Rehabilitation Services Office: [phone]   Evern Bio 02/03/2023, 9:48 AM

## 2023-02-03 NOTE — Progress Notes (Signed)
Physical Therapy Treatment Patient Details Name: Sydney Pace MRN: [id number] DOB: 05/23/1956 Today's Date: 02/03/2023   History of Present Illness 67 yo female admitted with AMS, R LE cellulitis, severe sepsis. Hx of bil chronic LE wounds-goes to wound center, morbid obesity, multiple sclerosis, orthostatic hypotension, depression    PT Comments  The patient ambulated x 100' using RW, +2 for safety for progressive mobility and safety as  patient reports that right leg fatigues and may buckle.   Continue PT for progressive  mobility.   If plan is discharge home, recommend the following: Direct supervision/assist for financial management;Assist for transportation;Help with stairs or ramp for entrance;A lot of help with walking and/or transfers;A lot of help with bathing/dressing/bathroom   Can travel by private vehicle     No  Equipment Recommendations       Recommendations for Other Services       Precautions / Restrictions Precautions Precautions: Fall Precaution Comments: bil lower leg and back of leg wounds Restrictions Weight Bearing Restrictions: No     Mobility  Bed Mobility   Bed Mobility: Supine to Sit     Supine to sit: Min assist     General bed mobility comments: assist to raise trunk    Transfers Overall transfer level: Needs assistance Equipment used: Rolling walker (2 wheels) Transfers: Sit to/from Stand Sit to Stand: From elevated surface, Mod assist, +2 physical assistance   Step pivot transfers: Min assist, +2 safety/equipment, +2 physical assistance, From elevated surface       General transfer comment: assist to rise and steady from elevated bed, requires 2 attempts prior to standing for pericare and ambulation in hall    Ambulation/Gait Ambulation/Gait assistance: Min assist, +2 safety/equipment Gait Distance (Feet): 100 Feet Assistive device: Rolling walker (2 wheels) Gait Pattern/deviations: Step-to pattern Gait velocity: decr      General Gait Details: gait  slow, steady, Patient realy pushed self today and tolerated well   Stairs             Wheelchair Mobility     Tilt Bed    Modified Rankin (Stroke Patients Only)       Balance Overall balance assessment: Needs assistance Sitting-balance support: Feet supported Sitting balance-Leahy Scale: Fair Sitting balance - Comments: supervision static sitting EOB   Standing balance support: Reliant on assistive device for balance, Bilateral upper extremity supported, During functional activity Standing balance-Leahy Scale: Poor Standing balance comment: reliant on B UE support                            Cognition Arousal: Alert Behavior During Therapy: WFL for tasks assessed/performed Overall Cognitive Status: Within Functional Limits for tasks assessed                                          Exercises      General Comments        Pertinent Vitals/Pain Pain Assessment Faces Pain Scale: Hurts little more Pain Location: LEs Pain Descriptors / Indicators: Grimacing, Guarding, Discomfort Pain Intervention(s): Monitored during session    Home Living                          Prior Function            PT Goals (current goals can now be found  in the care plan section) Progress towards PT goals: Progressing toward goals    Frequency    Min 1X/week      PT Plan      Co-evaluation PT/OT/SLP Co-Evaluation/Treatment: Yes Reason for Co-Treatment: For patient/therapist safety PT goals addressed during session: Mobility/safety with mobility OT goals addressed during session: ADL's and self-care      AM-PAC PT "6 Clicks" Mobility   Outcome Measure  Help needed turning from your back to your side while in a flat bed without using bedrails?: A Little Help needed moving from lying on your back to sitting on the side of a flat bed without using bedrails?: A Little Help needed moving to and from a  bed to a chair (including a wheelchair)?: A Lot Help needed standing up from a chair using your arms (e.g., wheelchair or bedside chair)?: A Lot Help needed to walk in hospital room?: A Lot Help needed climbing 3-5 steps with a railing? : Total 6 Click Score: 13    End of Session Equipment Utilized During Treatment: Gait belt Activity Tolerance: Patient tolerated treatment well Patient left: in chair;with call bell/phone within reach;with chair alarm set;with nursing/sitter in room Nurse Communication: Mobility status PT Visit Diagnosis: Muscle weakness (generalized) (M62.81);Difficulty in walking, not elsewhere classified (R26.2);Pain;Other abnormalities of gait and mobility (R26.89);Unsteadiness on feet (R26.81) Pain - part of body: Leg     Time: 1610-9604 PT Time Calculation (min) (ACUTE ONLY): 24 min  Charges:    $Gait Training: 8-22 mins PT General Charges $$ ACUTE PT VISIT: 1 Visit                     Blanchard Kelch PT Acute Rehabilitation Services Office [phone] Weekend [phone]    Rada Hay 02/03/2023, 11:01 AM

## 2023-02-03 NOTE — Discharge Summary (Signed)
Physician Discharge Summary  Mukta Svatos ZOX:[id number] DOB: May 16, 1956 DOA: 01/24/2023  PCP: Bradd Canary, MD  Admit date: 01/24/2023 Discharge date: 02/03/2023  Admitted From: Home Discharge disposition: SNF  Recommendations at discharge:  Complete the course of antibiotics as recommended Needs renal function test next week. Urology appointment with Dr. Jorje Guild at [facility] urology on 9/11 at 9 AM Encourage Ace wrapping to both legs   Brief narrative: Altheia Shadwell is a 67 y.o. female with PMH significant for morbid obesity, multiple sclerosis, HTN, GERD, hypothyroidism, depression, constipation who lives alone. 8/25, patient family went to check on her as they had not been able to contact her lately.  They found her confused, sitting on the toilet and brought to the ED  In the ED, patient had a fever of 100.7, blood pressure 171/76, breathing room air She was somnolent, disoriented She was noted to have extensive erythema and swelling of right lower extremity up to the groin.  Ulcerative skin on both lower extremity with drainage. Labs showed WBC count elevated to 16.2, lactic acid elevated to 2.2, CK level 1652 Respiratory virus panel unremarkable Urinalysis showed hazy yellow urine with large amount of hemoglobin, no bacteria nitrates or leukocytes Blood culture was sent CT head unremarkable for acute findings CT right lower extremity showed subcutaneous edema in the distal thigh and lower leg consistent with cellulitis.  No evidence of focal collection, foreign body or emphysema or osteomyelitis. Started on IV antibiotics, IV fluid Admitted to [facility] Wound care consulted  Subjective: Patient was seen and examined this morning.  Sitting up in recliner.  Not in distress.  Mental status intact  Hospital course: Severe sepsis POA Extensive Cellulitis RLE  B/L LE ulcerations Bacteremia with Staph epidermidis and Achromobacter xylosoxidans Met severe sepsis criteria with  fever, leukocytosis, lactic acidosis, altered mental status, source of infection Clinical and radiographic evidence of cellulitis without abscess Blood culture grew Staph epidermidis and Achromobacter xylosoxidans on all bottles. 8/29, ID consultation was obtained.  Antibiotics switched from IV Zosyn to IV meropenem. Repeat blood culture 8/29 did not show any growth..  Transthoracic echocardiogram did not show any vegetation Cellulitis improving.  WBC count improving.  Local nursing wound care to continue 9/3, ID switched from IV meropenem to oral Bactrim.  Renal function normal.  Continue Bactrim for next 7 days to complete the course of antibiotics.  To monitor renal function test next week. Recent Labs  Lab 01/28/23 0622 01/29/23 0408 02/01/23 1622  WBC 17.6* 12.6* 13.2*   Acute metabolic encephalopathy H/o ADHD, depression Patient was significantly altered on presentation secondary to infection.  Mental status gradually improving. PTA meds-Abilify, Zoloft, bupropion, Neurontin, Ritalin Initially held because of altered mental status.  Gradually resumed. Mental status stable.  Hypokalemia Potassium level improved with replacement. Recent Labs  Lab 01/28/23 0622 01/29/23 0408 01/31/23 0414 02/01/23 1622 02/03/23 0405  K 3.0* 3.0* 3.7 3.8 3.9    Mild rhabdomyolysis Due to being in the same position for long time. CK level was elevated to 1600s.  Adequately hydrated.  Gradually improved CK level.   Mild chronic anemia No active bleeding.  Hemoglobin mostly stable.  Continue to monitor Recent Labs    01/25/23 0343 01/27/23 0416 01/28/23 0622 01/29/23 0408 02/01/23 1622  HGB 10.0* 9.9* 9.9* 9.0* 10.3*  MCV 92.8 90.9 92.6 90.4 90.2   Essential hypertension PTA on lisinopril/HCTZ and Lasix PRN BP meds were held. Initially required IV fluid 8/28, patient was noted to have volume retention.  Gradually resumed blood pressure pills. Encourage Ace wrapping to both  legs  H/o multiple sclerosis generalized weakness Impaired mobility CT head negative for new lesions. PT eval obtained.  SNF recommended.  Goiter CT chest showed diffuse enlarged thyroid slightly larger than last seen in 2010.  No underlying adenopathy.  Radiologist also reported that thyroid has been previously evaluated with ultrasound and biopsy   Pulmonary nodules Per CT chest, patient had stable left upper lobe nodule from 2010. No further follow-up is recommended.  Urinary incontinence  Patient states she has an appointment [facility].  I called and check to confirm it.  She has an appointment on September 11 at 9 AM with Dr. Jorje Guild  Goals of care   Code Status: Full Code   Wounds:  - Wound / Incision (Open or Dehisced) Pretibial Proximal;Right dry, brown, pink (Active)  No Date First Assessed or Time First Assessed found.   Location: Pretibial  Location Orientation: Proximal;Right  Wound Description (Comments): dry, brown, pink  Present on Admission: Yes    Assessments 01/25/2023  1:45 AM 02/03/2023 10:00 AM  Wound Image     Dressing Type Petroleum;Abdominal pads;Gauze (Comment) Gauze (Comment);Other (Comment)  Dressing Changed New Changed  Dressing Status -- Clean, Dry, Intact  Dressing Change Frequency -- Daily  Site / Wound Assessment Dry;Brown;Pink Pink  Peri-wound Assessment Erythema (non-blanchable);Edema --  Wound Length (cm) 3 cm 2 cm  Wound Width (cm) 4 cm 2 cm  Wound Surface Area (cm^2) 12 cm^2 4 cm^2  Drainage Amount Minimal --  Drainage Description Serous --  Non-staged Wound Description Partial thickness --  Treatment Cleansed Other (Comment)     No associated orders.     Wound / Incision (Open or Dehisced) 01/25/23 Non-pressure wound Pretibial Right;Medial;Lower dry yellow, pink, red (Active)  Date First Assessed/Time First Assessed: 01/25/23 0145   Wound Type: Non-pressure wound  Location: Pretibial  Location Orientation: Right;Medial;Lower   Wound Description (Comments): dry yellow, pink, red  Present on Admission: Yes    Assessments 01/25/2023  1:45 AM 02/03/2023 10:00 AM  Dressing Type Abdominal pads;Petroleum;Gauze (Comment) Gauze (Comment);Other (Comment)  Dressing Changed New Changed  Dressing Status -- Clean, Dry, Intact  Dressing Change Frequency -- Daily  Site / Wound Assessment Pink;Red;Yellow Pink  Peri-wound Assessment Erythema (non-blanchable);Edema --  Wound Length (cm) 6 cm 5 cm  Wound Width (cm) 7 cm 4 cm  Wound Surface Area (cm^2) 42 cm^2 20 cm^2  Drainage Amount Minimal --  Drainage Description Serous --  Non-staged Wound Description Partial thickness --  Treatment Cleansed Other (Comment)     No associated orders.     Wound / Incision (Open or Dehisced) 01/25/23 Non-pressure wound Pretibial Distal;Right;Medial dry yellow, pink, red (Active)  Date First Assessed/Time First Assessed: 01/25/23 0145   Wound Type: Non-pressure wound  Location: Pretibial  Location Orientation: Distal;Right;Medial  Wound Description (Comments): dry yellow, pink, red  Present on Admission: Yes    Assessments 01/25/2023  1:45 AM 02/03/2023 10:00 AM  Dressing Type Gauze (Comment);Abdominal pads;Petroleum Gauze (Comment)  Dressing Changed New Changed  Dressing Status -- Clean, Dry, Intact  Dressing Change Frequency -- Daily  Site / Wound Assessment Dry;Pink;Red;Yellow Yellow;Dry  Peri-wound Assessment Erythema (non-blanchable) --  Wound Length (cm) 5 cm 4 cm  Wound Width (cm) 5 cm 4 cm  Wound Surface Area (cm^2) 25 cm^2 16 cm^2  Drainage Amount Minimal --  Drainage Description Serous --  Non-staged Wound Description Partial thickness --  Treatment -- Other (Comment)  No associated orders.     Wound / Incision (Open or Dehisced) 01/25/23 Non-pressure wound Pretibial Left dry brown, yellow, pink, red (Active)  Date First Assessed/Time First Assessed: 01/25/23 0145   Wound Type: Non-pressure wound  Location: Pretibial  Location  Orientation: Left  Wound Description (Comments): dry brown, yellow, pink, red  Present on Admission: Yes    Assessments 01/25/2023  1:45 AM 02/03/2023 10:00 AM  Dressing Type Gauze (Comment);Abdominal pads;Petroleum Gauze (Comment)  Dressing Changed New Changed  Dressing Status -- Clean, Dry, Intact  Dressing Change Frequency -- Daily  Site / Wound Assessment Brown;Pink;Red;Yellow Yellow  Peri-wound Assessment Erythema (non-blanchable) --  Wound Length (cm) 10 cm 8 cm  Wound Width (cm) 4 cm 2 cm  Wound Surface Area (cm^2) 40 cm^2 16 cm^2  Drainage Amount Minimal --  Drainage Description Serous --  Non-staged Wound Description Partial thickness --  Treatment Cleansed Other (Comment)     No associated orders.     Wound / Incision (Open or Dehisced) 01/25/23 Skin tear Buttocks Left;Lateral pink, red skin tear (Active)  Date First Assessed/Time First Assessed: 01/25/23 0145   Wound Type: Skin tear  Location: Buttocks  Location Orientation: Left;Lateral  Wound Description (Comments): pink, red skin tear  Present on Admission: Yes    Assessments 01/25/2023  1:45 AM 02/02/2023  9:41 AM  Dressing Type Petroleum;Gauze (Comment) Gauze (Comment)  Dressing Changed -- New  Dressing Status -- Clean, Dry, Intact  Site / Wound Assessment Pink --  Wound Length (cm) 6 cm --  Wound Width (cm) 0.25 cm --  Wound Surface Area (cm^2) 1.5 cm^2 --     No associated orders.     Wound / Incision (Open or Dehisced) 01/25/23 Irritant Dermatitis (Moisture Associated Skin Damage) Breast Lateral;Left;Lower red irrated (Active)  Date First Assessed/Time First Assessed: 01/25/23 0145   Wound Type: Irritant Dermatitis (Moisture Associated Skin Damage)  Location: Breast  Location Orientation: Lateral;Left;Lower  Wound Description (Comments): red irrated  Present on Admission: Yes    Assessments 01/25/2023  1:45 AM 02/03/2023 10:00 AM  Dressing Type None None  Site / Wound Assessment Red --  Wound Length (cm) -- 2 cm   Wound Width (cm) -- 1 cm  Wound Surface Area (cm^2) -- 2 cm^2  Treatment -- Other (Comment)     No associated orders.     Pressure Injury 01/25/23 Buttocks Right Deep Tissue Pressure Injury - Purple or maroon localized area of discolored intact skin or blood-filled blister due to damage of underlying soft tissue from pressure and/or shear. right lower buttocks and upper thi (Active)  Date First Assessed/Time First Assessed: 01/25/23 0145   Location: Buttocks  Location Orientation: Right  Staging: Deep Tissue Pressure Injury - Purple or maroon localized area of discolored intact skin or blood-filled blister due to damage of underly...    Assessments 01/25/2023  1:45 AM 02/03/2023  5:00 AM  Dressing Type None Foam - Lift dressing to assess site every shift;Other (Comment)  Dressing -- Changed  Dressing Change Frequency -- Daily  Site / Wound Assessment Purple Pink;Black  Wound Length (cm) 14 cm --  Wound Width (cm) 8 cm --  Wound Surface Area (cm^2) 112 cm^2 --     No associated orders.     Wound / Incision (Open or Dehisced) 01/25/23 Non-pressure wound Tibial Left;Posterior dry yellow and pink (Active)  Date First Assessed/Time First Assessed: 01/25/23 0145   Wound Type: Non-pressure wound  Location: Tibial  Location Orientation: Left;Posterior  Wound  Description (Comments): dry yellow and pink  Present on Admission: Yes    Assessments 01/25/2023  1:45 AM 02/03/2023 10:00 AM  Dressing Type Gauze (Comment) --  Dressing Changed New --  Site / Wound Assessment Pink;Yellow --  Wound Length (cm) 2 cm 1 cm  Wound Width (cm) 2 cm 1 cm  Wound Surface Area (cm^2) 4 cm^2 1 cm^2  Treatment Cleansed --     No associated orders.    Discharge Exam:   Vitals:   02/02/23 2055 02/03/23 0653 02/03/23 0939 02/03/23 1202  BP: (!) 151/71 (!) 140/67 (!) 158/57 129/79  Pulse: 81 81 79 77  Resp: 14 18 18 18   Temp: 97.9 F (36.6 C) 98.5 F (36.9 C) 98.8 F (37.1 C) 98.4 F (36.9 C)  TempSrc: Oral  Oral Oral Oral  SpO2: 98% 97% 98% 97%  Weight:      Height:        Body mass index is 45.41 kg/m.   General exam: Pleasant elderly African-American female.  Not in physical distress Skin: No rashes, lesions or ulcers. HEENT: Atraumatic, normocephalic, no obvious bleeding Lungs: Clear to auscultation bilaterally CVS: Regular rate and rhythm, no murmur GI/Abd soft nontender, nondistended, bowel sound present CNS: Alert, awake, slow to respond but oriented x 3. Psychiatry: Mood appropriate. Extremities: Right lower EXTR cellulitis much improved.  Bilateral pedal edema improving.  Follow ups:    Contact information for follow-up providers     Bradd Canary, MD Follow up.   Specialty: Family Medicine Contact information: [address] [place] [address] [phone]              Contact information for after-discharge care     Destination     [facility] .   Service: Skilled Nursing Contact information: [address] Test Update Address [place] [address] [phone]                     Discharge Instructions:   Discharge Instructions     Apply wrap   Complete by: As directed    Both legs   Call MD for:  difficulty breathing, headache or visual disturbances   Complete by: As directed    Call MD for:  extreme fatigue   Complete by: As directed    Call MD for:  hives   Complete by: As directed    Call MD for:  persistant dizziness or light-headedness   Complete by: As directed    Call MD for:  persistant nausea and vomiting   Complete by: As directed    Call MD for:  severe uncontrolled pain   Complete by: As directed    Call MD for:  temperature >100.4   Complete by: As directed    Diet general   Complete by: As directed    Discharge instructions   Complete by: As directed    Recommendations at discharge:   Complete the course of antibiotics as recommended  Needs renal  function test next week.  Urology appointment with Dr. Jorje Guild at [facility] urology on 9/11 at 9 AM  Encourage Ace wrapping to both legs  General discharge instructions: Follow with Primary MD Bradd Canary, MD in 7 days  Please request your PCP  to go over your hospital tests, procedures, radiology results at the follow up. Please get your medicines reviewed and adjusted.  Your PCP may decide to repeat certain labs or tests as needed.  Do not drive, operate heavy machinery, perform activities at heights, swimming or participation in water activities or provide baby sitting services if your were admitted for syncope or siezures until you have seen by Primary MD or a Neurologist and advised to do so again. [place] Controlled Substance Reporting System database was reviewed. Do not drive, operate heavy machinery, perform activities at heights, swim, participate in water activities or provide baby-sitting services while on medications for pain, sleep and mood until your outpatient physician has reevaluated you and advised to do so again.  You are strongly recommended to comply with the dose, frequency and duration of prescribed medications. Activity: As tolerated with Full fall precautions use walker/cane & assistance as needed Avoid using any recreational substances like cigarette, tobacco, alcohol, or non-prescribed drug. If you experience worsening of your admission symptoms, develop shortness of breath, life threatening emergency, suicidal or homicidal thoughts you must seek medical attention immediately by calling 911 or calling your MD immediately  if symptoms less severe. You must read complete instructions/literature along with all the possible adverse reactions/side effects for all the medicines you take and that have been prescribed to you. Take any new medicine only after you have completely understood and accepted all the possible adverse reactions/side effects.  Wear Seat belts  while driving. You were cared for by a hospitalist during your hospital stay. If you have any questions about your discharge medications or the care you received while you were in the hospital after you are discharged, you can call the unit and ask to speak with the hospitalist or the covering physician. Once you are discharged, your primary care physician will handle any further medical issues. Please note that NO REFILLS for any discharge medications will be authorized once you are discharged, as it is imperative that you return to your primary care physician (or establish a relationship with a primary care physician if you do not have one).   Discharge wound care:   Complete by: As directed    Increase activity slowly   Complete by: As directed        Discharge Medications:   Allergies as of 02/03/2023       Reactions   Shellfish Allergy Hives, Shortness Of Breath, Itching, Swelling, Rash   Pt had previously carried an epi-pen and had a history of severe reaction to shrimp with breathing problems and swelling of lips and tongue         Medication List     STOP taking these medications    cephALEXin 500 MG capsule Commonly known as: KEFLEX   chlorhexidine 4 % external liquid Commonly known as: HIBICLENS   dorzolamide-timolol 2-0.5 % ophthalmic solution Commonly known as: COSOPT   naproxen sodium 220 MG tablet Commonly known as: ALEVE       TAKE these medications    ARIPiprazole 5 MG tablet Commonly known as: ABILIFY Take 1 tablet (5 mg total) by mouth daily.   AZO-CRANBERRY PO Take 1 tablet by mouth daily as needed.   brimonidine 0.2 % ophthalmic solution Commonly known as: ALPHAGAN Place 1 drop into both eyes 2 (two) times daily.   buPROPion 300 MG 24 hr tablet Commonly known as: Wellbutrin XL Take 1 tablet (300 mg total) by mouth daily.   cetirizine 10 MG tablet Commonly known as: ZYRTEC Take 10 mg by mouth every evening.   DIALYVITE VITAMIN D 5000  PO Take 4,000 Units by mouth daily.   fluticasone 50 MCG/ACT nasal spray Commonly known  as: FLONASE USE 2 SPRAY(S) IN EACH NOSTRIL ONCE DAILY AS NEEDED FOR ALLERGIES OR RUNNY NOSE   furosemide 20 MG tablet Commonly known as: LASIX Take 1 tablet (20 mg total) by mouth daily as needed.   gabapentin 600 MG tablet Commonly known as: NEURONTIN Take one po at night   lisinopril-hydrochlorothiazide 20-25 MG tablet Commonly known as: ZESTORETIC Take 1 tablet by mouth once daily   LUBRICATING EYE DROPS OP Place 1 drop into both eyes daily as needed (dry eyes).   methylphenidate 20 MG tablet Commonly known as: Ritalin One po qAM and one po qNoon   multivitamin tablet Take 1 tablet by mouth daily.   mupirocin ointment 2 % Commonly known as: BACTROBAN Apply 1 Application topically 2 (two) times daily.   Myrbetriq 50 MG Tb24 tablet Generic drug: mirabegron ER Take 50 mg by mouth daily.   potassium chloride SA 20 MEQ tablet Commonly known as: KLOR-CON M 1 tab po prn 2nd Furosemide dose in a day   PROBIOTIC DAILY PO Take 1 tablet by mouth daily.   senna-docusate 8.6-50 MG tablet Commonly known as: Senokot-S Take 1 tablet by mouth at bedtime as needed for mild constipation.   sertraline 100 MG tablet Commonly known as: ZOLOFT TAKE 1 & 1/2 (ONE & ONE-HALF) TABLETS BY MOUTH ONCE DAILY   sulfamethoxazole-trimethoprim 800-160 MG tablet Commonly known as: BACTRIM DS Take 2 tablets by mouth every 12 (twelve) hours for 7 days.               Discharge Care Instructions  (From admission, onward)           Start     Ordered   02/03/23 0000  Discharge wound care:        02/03/23 1255             The results of significant diagnostics from this hospitalization (including imaging, microbiology, ancillary and laboratory) are listed below for reference.    Procedures and Diagnostic Studies:   CT TIBIA FIBULA RIGHT W CONTRAST  Result Date: 01/25/2023 CLINICAL  DATA:  Right lower extremity cellulitis. Clinical concern for abscess. EXAM: CT OF THE LOWER RIGHT EXTREMITY WITH CONTRAST TECHNIQUE: Multidetector CT imaging of the lower right extremity was performed according to the standard protocol following intravenous contrast administration. RADIATION DOSE REDUCTION: This exam was performed according to the departmental dose-optimization program which includes automated exposure control, adjustment of the mA and/or kV according to patient size and/or use of iterative reconstruction technique. CONTRAST:  OMNIPAQUE IOHEXOL 300 MG/ML  SOLN COMPARISON:  Radiographs 01/24/2023 and 12/22/2022. FINDINGS: Bones/Joint/Cartilage Separate studies of the right thigh and right lower leg were obtained with overlapping images at the knee. The images extend superiorly to the upper pelvis and inferiorly to the midfoot. No evidence of acute fracture, dislocation or osteomyelitis. There is no evidence of femoral head osteonecrosis or significant right hip arthropathy. There is facet hypertrophy in the lower lumbar spine. Advanced tricompartmental degenerative changes are present within the right knee with a small knee joint effusion. No significant ankle arthropathy or effusion. Mild subtalar degenerative changes. Ligaments Suboptimally assessed by CT. Muscles and Tendons No intramuscular fluid collection or abnormal enhancement identified. The quadriceps and patellar tendons are intact. The visualized ankle tendons appear intact without significant tenosynovitis. Soft tissues Mild nonspecific subcutaneous edema in the distal thigh, greatest posteriorly and laterally. Edema extends into the lower leg where it is more diffuse and greatest distally. No focal fluid collection, foreign body or  soft tissue emphysema identified. No evidence of acute vascular abnormality. There are scattered subcutaneous calcifications anteriorly in the lower leg. Prominent right inguinal lymph nodes, likely  reactive. Probable posterior uterine fibroid. IMPRESSION: 1. Nonspecific subcutaneous edema in the distal thigh and lower leg, as described, consistent with the given history of cellulitis. No focal fluid collection, foreign body or soft tissue emphysema identified. 2. No evidence of osteomyelitis or acute osseous findings. 3. Advanced tricompartmental degenerative changes in the right knee with a small knee joint effusion. 4. Prominent right inguinal lymph nodes, likely reactive. Electronically Signed   By: Carey Bullocks M.D.   On: 01/25/2023 14:42   CT FEMUR RIGHT W CONTRAST  Result Date: 01/25/2023 CLINICAL DATA:  Right lower extremity cellulitis. Clinical concern for abscess. EXAM: CT OF THE LOWER RIGHT EXTREMITY WITH CONTRAST TECHNIQUE: Multidetector CT imaging of the lower right extremity was performed according to the standard protocol following intravenous contrast administration. RADIATION DOSE REDUCTION: This exam was performed according to the departmental dose-optimization program which includes automated exposure control, adjustment of the mA and/or kV according to patient size and/or use of iterative reconstruction technique. CONTRAST:  OMNIPAQUE IOHEXOL 300 MG/ML  SOLN COMPARISON:  Radiographs 01/24/2023 and 12/22/2022. FINDINGS: Bones/Joint/Cartilage Separate studies of the right thigh and right lower leg were obtained with overlapping images at the knee. The images extend superiorly to the upper pelvis and inferiorly to the midfoot. No evidence of acute fracture, dislocation or osteomyelitis. There is no evidence of femoral head osteonecrosis or significant right hip arthropathy. There is facet hypertrophy in the lower lumbar spine. Advanced tricompartmental degenerative changes are present within the right knee with a small knee joint effusion. No significant ankle arthropathy or effusion. Mild subtalar degenerative changes. Ligaments Suboptimally assessed by CT. Muscles and Tendons No  intramuscular fluid collection or abnormal enhancement identified. The quadriceps and patellar tendons are intact. The visualized ankle tendons appear intact without significant tenosynovitis. Soft tissues Mild nonspecific subcutaneous edema in the distal thigh, greatest posteriorly and laterally. Edema extends into the lower leg where it is more diffuse and greatest distally. No focal fluid collection, foreign body or soft tissue emphysema identified. No evidence of acute vascular abnormality. There are scattered subcutaneous calcifications anteriorly in the lower leg. Prominent right inguinal lymph nodes, likely reactive. Probable posterior uterine fibroid. IMPRESSION: 1. Nonspecific subcutaneous edema in the distal thigh and lower leg, as described, consistent with the given history of cellulitis. No focal fluid collection, foreign body or soft tissue emphysema identified. 2. No evidence of osteomyelitis or acute osseous findings. 3. Advanced tricompartmental degenerative changes in the right knee with a small knee joint effusion. 4. Prominent right inguinal lymph nodes, likely reactive. Electronically Signed   By: Carey Bullocks M.D.   On: 01/25/2023 14:42   CT Chest W Contrast  Result Date: 01/25/2023 CLINICAL DATA:  Abnormal mediastinal silhouette, possible lymphadenopathy EXAM: CT CHEST WITH CONTRAST TECHNIQUE: Multidetector CT imaging of the chest was performed during intravenous contrast administration. RADIATION DOSE REDUCTION: This exam was performed according to the departmental dose-optimization program which includes automated exposure control, adjustment of the mA and/or kV according to patient size and/or use of iterative reconstruction technique. CONTRAST:  75mL OMNIPAQUE IOHEXOL 300 MG/ML  SOLN COMPARISON:  Chest x-ray from earlier in the same day, CT from 01/23/2009 FINDINGS: Cardiovascular: Thoracic aorta is within normal limits. Pulmonary artery shows no large central pulmonary embolus.  Timing was not performed for embolus evaluation. No coronary calcifications are noted. No cardiac enlargement  is seen. Mediastinum/Nodes: Thoracic inlet demonstrates diffuse goiter sin large mint of the thyroid bilaterally. Scattered small nodules are noted within. This is slightly more prominent than that seen in 2010 and has been evaluated on prior ultrasound with subsequent biopsy. No hilar or mediastinal adenopathy is noted. The esophagus is within normal limits. Lungs/Pleura: Lungs are well aerated bilaterally. No focal infiltrate or sizable effusion is seen. Stable left upper lobe nodule is again seen on image number 44 of series 6. Given its long-term stability, no further follow-up is recommended. No other nodules are seen. Upper Abdomen: No acute abnormality. Musculoskeletal: No chest wall abnormality. No acute or significant osseous findings. IMPRESSION: Previously seen abnormality is related to a diffusely enlarged thyroid. It is slightly larger than that seen in 2010. No underlying adenopathy is noted. Thyroid has been previously evaluated with ultrasound and prior biopsy. Stable left upper lobe nodule from 20/10. No further follow-up is recommended. Electronically Signed   By: Alcide Clever M.D.   On: 01/25/2023 00:53   CT Head Wo Contrast  Result Date: 01/24/2023 CLINICAL DATA:  Delirium, last known normal on Monday EXAM: CT HEAD WITHOUT CONTRAST TECHNIQUE: Contiguous axial images were obtained from the base of the skull through the vertex without intravenous contrast. RADIATION DOSE REDUCTION: This exam was performed according to the departmental dose-optimization program which includes automated exposure control, adjustment of the mA and/or kV according to patient size and/or use of iterative reconstruction technique. COMPARISON:  No prior CT head available, correlation is made with MRI brain 02/28/2017 FINDINGS: Brain: No evidence of acute infarction, hemorrhage, mass, mass effect, or midline  shift. No hydrocephalus or extra-axial fluid collection. Periventricular white matter changes, likely the sequela of chronic small vessel ischemic disease. Partial empty sella, with expansion of the sella, similar to the prior MRI. Normal craniocervical junction Vascular: No hyperdense vessel. Skull: Negative for fracture or focal lesion. Hyperostosis frontalis Sinuses/Orbits: Clear imaged paranasal sinuses. Tortuous optic nerves. Other: The mastoid air cells are well aerated. IMPRESSION: 1. No acute intracranial process. 2. Partial empty sella with expansion of the sella, similar to the prior MRI, with tortuous optic nerves, which can be seen in the setting of idiopathic intracranial hypertension. Electronically Signed   By: Wiliam Ke M.D.   On: 01/24/2023 23:23   DG Femur Portable 1 View Right  Result Date: 01/24/2023 CLINICAL DATA:  Lower extremity cellulitis EXAM: RIGHT FEMUR PORTABLE 1 VIEW COMPARISON:  None Available. FINDINGS: There is no evidence of fracture or other focal bone lesions. Soft tissues are unremarkable. IMPRESSION: No acute abnormality noted. Electronically Signed   By: Alcide Clever M.D.   On: 01/24/2023 23:14   DG Tibia/Fibula Right Port  Result Date: 01/24/2023 CLINICAL DATA:  Lower leg cellulitis EXAM: PORTABLE RIGHT TIBIA AND FIBULA - 2 VIEW COMPARISON:  12/22/2022 FINDINGS: Mild soft tissue edema is noted. No focal wound is seen. Underlying bony structures show no acute fracture or dislocation. Degenerative changes are noted in the knee joint worst in the lateral joint space similar to that seen on the prior exam. IMPRESSION: Degenerative change with soft tissue edema. No acute abnormality is noted. Electronically Signed   By: Alcide Clever M.D.   On: 01/24/2023 23:13   DG Chest 2 View  Result Date: 01/24/2023 CLINICAL DATA:  Sepsis. EXAM: CHEST - 2 VIEW COMPARISON:  Radiograph dated 07/13/2005. FINDINGS: No focal consolidation, pleural effusion, or pneumothorax. Enlargement  of the right paratracheal soft tissue may be projectional and related to upper mediastinal  vasculature. Right paratracheal adenopathy or mass is not excluded. Further evaluation with CT is recommended. The cardiac silhouette is within normal limits. No acute osseous pathology. IMPRESSION: 1. No focal consolidation. 2. Right paratracheal soft tissue enlargement. Further evaluation with CT is recommended. Electronically Signed   By: Elgie Collard M.D.   On: 01/24/2023 23:08     Labs:   Basic Metabolic Panel: Recent Labs  Lab 01/28/23 0622 01/29/23 0408 01/31/23 0414 02/01/23 0431 02/01/23 1622 02/03/23 0405  NA 135 140 138  --  138 140  K 3.0* 3.0* 3.7  --  3.8 3.9  CL 103 107 103  --  101 103  CO2 23 26 28   --  28 27  GLUCOSE 108* 120* 87  --  99 88  BUN 19 20 13   --  14 15  CREATININE 1.05* 0.91 0.92 0.81 0.93 0.98  CALCIUM 9.0 8.8* 8.9  --  9.3 9.5   GFR Estimated Creatinine Clearance: 77.7 mL/min (by C-G formula based on SCr of 0.98 mg/dL). Liver Function Tests: No results for input(s): "AST", "ALT", "ALKPHOS", "BILITOT", "PROT", "ALBUMIN" in the last 168 hours. No results for input(s): "LIPASE", "AMYLASE" in the last 168 hours. No results for input(s): "AMMONIA" in the last 168 hours. Coagulation profile No results for input(s): "INR", "PROTIME" in the last 168 hours.  CBC: Recent Labs  Lab 01/28/23 0622 01/29/23 0408 02/01/23 1622  WBC 17.6* 12.6* 13.2*  NEUTROABS 13.3* 10.8* 9.9*  HGB 9.9* 9.0* 10.3*  HCT 31.3* 28.4* 32.1*  MCV 92.6 90.4 90.2  PLT 235 233 335   Cardiac Enzymes: No results for input(s): "CKTOTAL", "CKMB", "CKMBINDEX", "TROPONINI" in the last 168 hours. BNP: Invalid input(s): "POCBNP" CBG: No results for input(s): "GLUCAP" in the last 168 hours. D-Dimer No results for input(s): "DDIMER" in the last 72 hours. Hgb A1c No results for input(s): "HGBA1C" in the last 72 hours. Lipid Profile No results for input(s): "CHOL", "HDL", "LDLCALC",  "TRIG", "CHOLHDL", "LDLDIRECT" in the last 72 hours. Thyroid function studies No results for input(s): "TSH", "T4TOTAL", "T3FREE", "THYROIDAB" in the last 72 hours.  Invalid input(s): "FREET3" Anemia work up No results for input(s): "VITAMINB12", "FOLATE", "FERRITIN", "TIBC", "IRON", "RETICCTPCT" in the last 72 hours. Microbiology Recent Results (from the past 240 hour(s))  Blood Culture (routine x 2)     Status: Abnormal   Collection Time: 01/24/23 10:15 PM   Specimen: BLOOD RIGHT FOREARM  Result Value Ref Range Status   Specimen Description   Final    BLOOD RIGHT FOREARM Performed at [facility], [address], [place], [place] [address]    Special Requests   Final    BOTTLES DRAWN AEROBIC AND ANAEROBIC Blood Culture adequate volume Performed at [facility], [address] [place], [place] [address]    Culture  Setup Time   Final    GRAM NEGATIVE RODS IN BOTH AEROBIC AND ANAEROBIC BOTTLES CRITICAL VALUE NOTED.  VALUE IS CONSISTENT WITH PREVIOUSLY REPORTED AND CALLED VALUE. Performed at [facility], [address], [place], [place] [address]    Culture ACHROMOBACTER XYLOSOXIDANS (A)  Final   Report Status 01/28/2023 FINAL  Final   Organism ID, Bacteria ACHROMOBACTER XYLOSOXIDANS  Final      Susceptibility   Achromobacter xylosoxidans - MIC*    GENTAMICIN 8 INTERMEDIATE Intermediate     CIPROFLOXACIN >=4 RESISTANT Resistant     IMIPENEM 4 SENSITIVE Sensitive     TRIMETH/SULFA <=20 SENSITIVE Sensitive     *  ACHROMOBACTER XYLOSOXIDANS  Resp panel by RT-PCR (RSV, Flu A&B, Covid) Anterior Nasal Swab     Status: None   Collection Time: 01/24/23 10:16 PM   Specimen: Anterior Nasal Swab  Result Value Ref Range Status   SARS Coronavirus 2 by RT PCR NEGATIVE NEGATIVE Final    Comment: (NOTE) SARS-CoV-2 target nucleic acids are NOT DETECTED.  The SARS-CoV-2 RNA is generally detectable in upper respiratory specimens during the acute  phase of infection. The lowest concentration of SARS-CoV-2 viral copies this assay can detect is 138 copies/mL. A negative result does not preclude SARS-Cov-2 infection and should not be used as the sole basis for treatment or other patient management decisions. A negative result may occur with  improper specimen collection/handling, submission of specimen other than nasopharyngeal swab, presence of viral mutation(s) within the areas targeted by this assay, and inadequate number of viral copies(<138 copies/mL). A negative result must be combined with clinical observations, patient history, and epidemiological information. The expected result is Negative.  Fact Sheet for Patients:  BloggerCourse.com  Fact Sheet for Healthcare Providers:  SeriousBroker.it  This test is no t yet approved or cleared by the [place] FDA and  has been authorized for detection and/or diagnosis of SARS-CoV-2 by FDA under an Emergency Use Authorization (EUA). This EUA will remain  in effect (meaning this test can be used) for the duration of the COVID-19 declaration under Section 564(b)(1) of the Act, 21 U.S.C.section 360bbb-3(b)(1), unless the authorization is terminated  or revoked sooner.       Influenza A by PCR NEGATIVE NEGATIVE Final   Influenza B by PCR NEGATIVE NEGATIVE Final    Comment: (NOTE) The Xpert Xpress SARS-CoV-2/FLU/RSV plus assay is intended as an aid in the diagnosis of influenza from Nasopharyngeal swab specimens and should not be used as a sole basis for treatment. Nasal washings and aspirates are unacceptable for Xpert Xpress SARS-CoV-2/FLU/RSV testing.  Fact Sheet for Patients: BloggerCourse.com  Fact Sheet for Healthcare Providers: SeriousBroker.it  This test is not yet approved or cleared by the [place] FDA and has been authorized for detection and/or diagnosis of  SARS-CoV-2 by FDA under an Emergency Use Authorization (EUA). This EUA will remain in effect (meaning this test can be used) for the duration of the COVID-19 declaration under Section 564(b)(1) of the Act, 21 U.S.C. section 360bbb-3(b)(1), unless the authorization is terminated or revoked.     Resp Syncytial Virus by PCR NEGATIVE NEGATIVE Final    Comment: (NOTE) Fact Sheet for Patients: BloggerCourse.com  Fact Sheet for Healthcare Providers: SeriousBroker.it  This test is not yet approved or cleared by the [place] FDA and has been authorized for detection and/or diagnosis of SARS-CoV-2 by FDA under an Emergency Use Authorization (EUA). This EUA will remain in effect (meaning this test can be used) for the duration of the COVID-19 declaration under Section 564(b)(1) of the Act, 21 U.S.C. section 360bbb-3(b)(1), unless the authorization is terminated or revoked.  Performed at [facility], [address], [place], [place] [address]   SARS Coronavirus 2 by RT PCR (hospital order, performed in [facility] hospital lab) *cepheid single result test* Anterior Nasal Swab     Status: None   Collection Time: 01/24/23 10:16 PM   Specimen: Anterior Nasal Swab  Result Value Ref Range Status   SARS Coronavirus 2 by RT PCR NEGATIVE NEGATIVE Final    Comment: (NOTE) SARS-CoV-2 target nucleic acids are NOT DETECTED.  The SARS-CoV-2 RNA is generally detectable in  upper and lower respiratory specimens during the acute phase of infection. The lowest concentration of SARS-CoV-2 viral copies this assay can detect is 250 copies / mL. A negative result does not preclude SARS-CoV-2 infection and should not be used as the sole basis for treatment or other patient management decisions.  A negative result may occur with improper specimen collection / handling, submission of specimen other than nasopharyngeal swab, presence of  viral mutation(s) within the areas targeted by this assay, and inadequate number of viral copies (<250 copies / mL). A negative result must be combined with clinical observations, patient history, and epidemiological information.  Fact Sheet for Patients:   RoadLapTop.co.za  Fact Sheet for Healthcare Providers: http://kim-miller.com/  This test is not yet approved or  cleared by the [place] FDA and has been authorized for detection and/or diagnosis of SARS-CoV-2 by FDA under an Emergency Use Authorization (EUA).  This EUA will remain in effect (meaning this test can be used) for the duration of the COVID-19 declaration under Section 564(b)(1) of the Act, 21 U.S.C. section 360bbb-3(b)(1), unless the authorization is terminated or revoked sooner.  Performed at [facility], [address] [place], [place] [address]   Blood Culture (routine x 2)     Status: Abnormal   Collection Time: 01/24/23 10:40 PM   Specimen: BLOOD  Result Value Ref Range Status   Specimen Description   Final    BLOOD LEFT ANTECUBITAL Performed at [facility], [address] [place], [place] [address]    Special Requests   Final    BOTTLES DRAWN AEROBIC AND ANAEROBIC Blood Culture adequate volume Performed at [facility], [address] [place], [place] [address]    Culture  Setup Time   Final    GRAM POSITIVE COCCI IN CLUSTERS AEROBIC BOTTLE ONLY GRAM NEGATIVE RODS IN BOTH AEROBIC AND ANAEROBIC BOTTLES CRITICAL RESULT CALLED TO, READ BACK BY AND VERIFIED WITH: PHARMD M LILLISTON 01/26/23 @ 0013 BY AB    Culture (A)  Final    STAPHYLOCOCCUS EPIDERMIDIS THE SIGNIFICANCE OF ISOLATING THIS ORGANISM FROM A SINGLE SET OF BLOOD CULTURES WHEN MULTIPLE SETS ARE DRAWN IS UNCERTAIN. PLEASE NOTIFY THE MICROBIOLOGY DEPARTMENT WITHIN ONE WEEK IF SPECIATION AND SENSITIVITIES ARE REQUIRED. ACHROMOBACTER  SPECIES SUSCEPTIBILITIES PERFORMED ON PREVIOUS CULTURE WITHIN THE LAST 5 DAYS. Performed at [facility], [address] [place], [place] [address]    Report Status 01/28/2023 FINAL  Final  Blood Culture ID Panel (Reflexed)     Status: Abnormal   Collection Time: 01/24/23 10:40 PM  Result Value Ref Range Status   Enterococcus faecalis NOT DETECTED NOT DETECTED Final   Enterococcus Faecium NOT DETECTED NOT DETECTED Final   Listeria monocytogenes NOT DETECTED NOT DETECTED Final   Staphylococcus species DETECTED (A) NOT DETECTED Final    Comment: CRITICAL RESULT CALLED TO, READ BACK BY AND VERIFIED WITH: PHARMD M LILLISTON 01/26/23 @ 0013 BY AB    Staphylococcus aureus (BCID) NOT DETECTED NOT DETECTED Final   Staphylococcus epidermidis DETECTED (A) NOT DETECTED Final    Comment: Methicillin (oxacillin) resistant coagulase negative staphylococcus. Possible blood culture contaminant (unless isolated from more than one blood culture draw or clinical case suggests pathogenicity). No antibiotic treatment is indicated for blood  culture contaminants. CRITICAL RESULT CALLED TO, READ BACK BY AND VERIFIED WITH: PHARMD M LILLISTON 01/26/23 @ 0013 BY AB    Staphylococcus lugdunensis NOT DETECTED NOT DETECTED Final   Streptococcus species NOT DETECTED NOT  DETECTED Final   Streptococcus agalactiae NOT DETECTED NOT DETECTED Final   Streptococcus pneumoniae NOT DETECTED NOT DETECTED Final   Streptococcus pyogenes NOT DETECTED NOT DETECTED Final   A.calcoaceticus-baumannii NOT DETECTED NOT DETECTED Final   Bacteroides fragilis NOT DETECTED NOT DETECTED Final   Enterobacterales NOT DETECTED NOT DETECTED Final   Enterobacter cloacae complex NOT DETECTED NOT DETECTED Final   Escherichia coli NOT DETECTED NOT DETECTED Final   Klebsiella aerogenes NOT DETECTED NOT DETECTED Final   Klebsiella oxytoca NOT DETECTED NOT DETECTED Final   Klebsiella pneumoniae NOT DETECTED NOT DETECTED Final   Proteus  species NOT DETECTED NOT DETECTED Final   Salmonella species NOT DETECTED NOT DETECTED Final   Serratia marcescens NOT DETECTED NOT DETECTED Final   Haemophilus influenzae NOT DETECTED NOT DETECTED Final   Neisseria meningitidis NOT DETECTED NOT DETECTED Final   Pseudomonas aeruginosa NOT DETECTED NOT DETECTED Final   Stenotrophomonas maltophilia NOT DETECTED NOT DETECTED Final   Candida albicans NOT DETECTED NOT DETECTED Final   Candida auris NOT DETECTED NOT DETECTED Final   Candida glabrata NOT DETECTED NOT DETECTED Final   Candida krusei NOT DETECTED NOT DETECTED Final   Candida parapsilosis NOT DETECTED NOT DETECTED Final   Candida tropicalis NOT DETECTED NOT DETECTED Final   Cryptococcus neoformans/gattii NOT DETECTED NOT DETECTED Final   Methicillin resistance mecA/C DETECTED (A) NOT DETECTED Final    Comment: CRITICAL RESULT CALLED TO, READ BACK BY AND VERIFIED WITH: PHARMD M LILLISTON 01/26/23 @ 0013 BY AB Performed at [facility], [address], [place], [place] [address]   MRSA Next Gen by PCR, Nasal     Status: None   Collection Time: 01/25/23  2:00 AM   Specimen: Nasal Mucosa; Nasal Swab  Result Value Ref Range Status   MRSA by PCR Next Gen NOT DETECTED NOT DETECTED Final    Comment: (NOTE) The GeneXpert MRSA Assay (FDA approved for NASAL specimens only), is one component of a comprehensive MRSA colonization surveillance program. It is not intended to diagnose MRSA infection nor to guide or monitor treatment for MRSA infections. Test performance is not FDA approved in patients less than 25 years old. Performed at [facility], [address] [place], [place] [address]   Culture, blood (Routine X 2) w Reflex to ID Panel     Status: None   Collection Time: 01/28/23  9:49 AM   Specimen: BLOOD  Result Value Ref Range Status   Specimen Description   Final    BLOOD BLOOD RIGHT ARM Performed at [facility], [address], [place], [place] [address]    Special Requests   Final    BOTTLES DRAWN AEROBIC AND ANAEROBIC Blood Culture adequate volume Performed at [facility], [address], [place], [place] [address]    Culture   Final    NO GROWTH 5 DAYS Performed at [facility], [address], [place], [place] [address]    Report Status 02/02/2023 FINAL  Final  Culture, blood (Routine X 2) w Reflex to ID Panel     Status: None   Collection Time: 01/28/23  9:57 AM   Specimen: BLOOD  Result Value Ref Range Status   Specimen Description   Final    BLOOD BLOOD LEFT ARM Performed at [facility], [address], [place], [place] [address]    Special Requests   Final    BOTTLES DRAWN AEROBIC AND ANAEROBIC Blood Culture  adequate volume Performed at [facility], [address], [place], [place] [address]    Culture   Final    NO GROWTH 5 DAYS Performed at [facility], [address], [place], [place] [address]    Report Status 02/02/2023 FINAL  Final    Time coordinating discharge: 45 minutes  Signed: Vidya Bamford  [facility] 02/03/2023, 12:55 PM

## 2023-02-03 NOTE — Progress Notes (Signed)
Called [facility] times, unable to reach RN for report. Left voice mail and phone number for call back.

## 2023-02-03 NOTE — Progress Notes (Signed)
Called report to facility and RN, All questions answered. Called niece per request. Belongins packed. PTAR at bedside.

## 2023-02-03 NOTE — Plan of Care (Signed)
  Problem: Education: Goal: Knowledge of General Education information will improve Description: Including pain rating scale, medication(s)/side effects and non-pharmacologic comfort measures Outcome: Progressing   Problem: Health Behavior/Discharge Planning: Goal: Ability to manage health-related needs will improve Outcome: Progressing   Problem: Clinical Measurements: Goal: Ability to maintain clinical measurements within normal limits will improve Outcome: Progressing   Problem: Activity: Goal: Risk for activity intolerance will decrease Outcome: Progressing   Problem: Skin Integrity: Goal: Risk for impaired skin integrity will decrease Outcome: Progressing   

## 2023-02-04 DIAGNOSIS — R32 Unspecified urinary incontinence: Secondary | ICD-10-CM | POA: Diagnosis not present

## 2023-02-04 DIAGNOSIS — R7881 Bacteremia: Secondary | ICD-10-CM | POA: Diagnosis not present

## 2023-02-04 DIAGNOSIS — L97929 Non-pressure chronic ulcer of unspecified part of left lower leg with unspecified severity: Secondary | ICD-10-CM | POA: Diagnosis not present

## 2023-02-04 DIAGNOSIS — I10 Essential (primary) hypertension: Secondary | ICD-10-CM | POA: Diagnosis not present

## 2023-02-04 DIAGNOSIS — L03115 Cellulitis of right lower limb: Secondary | ICD-10-CM | POA: Diagnosis not present

## 2023-02-07 ENCOUNTER — Other Ambulatory Visit: Payer: Medicare (Managed Care)

## 2023-02-10 DIAGNOSIS — I10 Essential (primary) hypertension: Secondary | ICD-10-CM | POA: Diagnosis not present

## 2023-02-10 DIAGNOSIS — E559 Vitamin D deficiency, unspecified: Secondary | ICD-10-CM | POA: Diagnosis not present

## 2023-02-10 DIAGNOSIS — I70238 Atherosclerosis of native arteries of right leg with ulceration of other part of lower right leg: Secondary | ICD-10-CM | POA: Diagnosis not present

## 2023-02-10 DIAGNOSIS — L97811 Non-pressure chronic ulcer of other part of right lower leg limited to breakdown of skin: Secondary | ICD-10-CM | POA: Diagnosis not present

## 2023-02-12 ENCOUNTER — Telehealth (HOSPITAL_BASED_OUTPATIENT_CLINIC_OR_DEPARTMENT_OTHER): Payer: Self-pay

## 2023-02-17 DIAGNOSIS — L97811 Non-pressure chronic ulcer of other part of right lower leg limited to breakdown of skin: Secondary | ICD-10-CM | POA: Diagnosis not present

## 2023-02-17 DIAGNOSIS — I70238 Atherosclerosis of native arteries of right leg with ulceration of other part of lower right leg: Secondary | ICD-10-CM | POA: Diagnosis not present

## 2023-02-17 DIAGNOSIS — I10 Essential (primary) hypertension: Secondary | ICD-10-CM | POA: Diagnosis not present

## 2023-02-17 DIAGNOSIS — E559 Vitamin D deficiency, unspecified: Secondary | ICD-10-CM | POA: Diagnosis not present

## 2023-02-18 ENCOUNTER — Other Ambulatory Visit: Payer: Self-pay | Admitting: *Deleted

## 2023-02-18 DIAGNOSIS — R600 Localized edema: Secondary | ICD-10-CM

## 2023-02-23 DIAGNOSIS — R32 Unspecified urinary incontinence: Secondary | ICD-10-CM | POA: Diagnosis not present

## 2023-02-23 DIAGNOSIS — L03115 Cellulitis of right lower limb: Secondary | ICD-10-CM | POA: Diagnosis not present

## 2023-02-23 DIAGNOSIS — L97929 Non-pressure chronic ulcer of unspecified part of left lower leg with unspecified severity: Secondary | ICD-10-CM | POA: Diagnosis not present

## 2023-02-23 DIAGNOSIS — M170 Bilateral primary osteoarthritis of knee: Secondary | ICD-10-CM | POA: Diagnosis not present

## 2023-02-23 DIAGNOSIS — M1712 Unilateral primary osteoarthritis, left knee: Secondary | ICD-10-CM | POA: Diagnosis not present

## 2023-02-23 DIAGNOSIS — M1711 Unilateral primary osteoarthritis, right knee: Secondary | ICD-10-CM | POA: Diagnosis not present

## 2023-02-23 DIAGNOSIS — R7881 Bacteremia: Secondary | ICD-10-CM | POA: Diagnosis not present

## 2023-02-23 DIAGNOSIS — I10 Essential (primary) hypertension: Secondary | ICD-10-CM | POA: Diagnosis not present

## 2023-02-25 ENCOUNTER — Ambulatory Visit (HOSPITAL_COMMUNITY): Payer: Medicare (Managed Care)

## 2023-03-01 ENCOUNTER — Ambulatory Visit (HOSPITAL_BASED_OUTPATIENT_CLINIC_OR_DEPARTMENT_OTHER): Payer: Medicare (Managed Care) | Admitting: General Surgery

## 2023-03-02 DIAGNOSIS — Z79899 Other long term (current) drug therapy: Secondary | ICD-10-CM | POA: Diagnosis not present

## 2023-03-02 DIAGNOSIS — M799 Soft tissue disorder, unspecified: Secondary | ICD-10-CM | POA: Diagnosis not present

## 2023-03-02 DIAGNOSIS — Z556 Problems related to health literacy: Secondary | ICD-10-CM | POA: Diagnosis not present

## 2023-03-02 DIAGNOSIS — I951 Orthostatic hypotension: Secondary | ICD-10-CM | POA: Diagnosis not present

## 2023-03-02 DIAGNOSIS — D649 Anemia, unspecified: Secondary | ICD-10-CM | POA: Diagnosis not present

## 2023-03-02 DIAGNOSIS — Z602 Problems related to living alone: Secondary | ICD-10-CM | POA: Diagnosis not present

## 2023-03-02 DIAGNOSIS — E872 Acidosis, unspecified: Secondary | ICD-10-CM | POA: Diagnosis not present

## 2023-03-02 DIAGNOSIS — L03115 Cellulitis of right lower limb: Secondary | ICD-10-CM | POA: Diagnosis not present

## 2023-03-02 DIAGNOSIS — M6282 Rhabdomyolysis: Secondary | ICD-10-CM | POA: Diagnosis not present

## 2023-03-02 DIAGNOSIS — F988 Other specified behavioral and emotional disorders with onset usually occurring in childhood and adolescence: Secondary | ICD-10-CM | POA: Diagnosis not present

## 2023-03-02 DIAGNOSIS — E039 Hypothyroidism, unspecified: Secondary | ICD-10-CM | POA: Diagnosis not present

## 2023-03-02 DIAGNOSIS — F32A Depression, unspecified: Secondary | ICD-10-CM | POA: Diagnosis not present

## 2023-03-02 DIAGNOSIS — G9341 Metabolic encephalopathy: Secondary | ICD-10-CM | POA: Diagnosis not present

## 2023-03-03 ENCOUNTER — Other Ambulatory Visit: Payer: Self-pay | Admitting: Neurology

## 2023-03-03 MED ORDER — METHYLPHENIDATE HCL 20 MG PO TABS: ORAL_TABLET | ORAL | 0 refills | Status: DC

## 2023-03-03 NOTE — Telephone Encounter (Signed)
Last seen on 11/18/22 Follow up scheduled on 05/31/23 Last filled on 02/03/23 #5 tablets  Rx pending to be signed

## 2023-03-03 NOTE — Assessment & Plan Note (Addendum)
Recently admitted with sepsis and is now established with the wound clinic they are seeing once to twice weekly. Wound is improving. She is advised that if her wound worsens again once her antibiotics are completed she needs to reach out for further treatment.

## 2023-03-03 NOTE — Assessment & Plan Note (Signed)
hgba1c acceptable, minimize simple carbs. Increase exercise as tolerated.  

## 2023-03-03 NOTE — Assessment & Plan Note (Signed)
Encourage heart healthy diet such as MIND or DASH diet, increase exercise, avoid trans fats, simple carbohydrates and processed foods, consider a krill or fish or flaxseed oil cap daily.  °

## 2023-03-03 NOTE — Assessment & Plan Note (Signed)
Encouraged DASH or MIND diet, decrease po intake and increase exercise as tolerated. Needs 7-8 hours of sleep nightly. Avoid trans fats, eat small, frequent meals every 4-5 hours with lean proteins, complex carbs and healthy fats. Minimize simple carbs, high fat foods and processed foods 

## 2023-03-03 NOTE — Assessment & Plan Note (Signed)
No recent flare.  

## 2023-03-03 NOTE — Assessment & Plan Note (Signed)
Supplement and monitor 

## 2023-03-03 NOTE — Assessment & Plan Note (Signed)
Well controlled, no changes to meds. Encouraged heart healthy diet such as the DASH diet and exercise as tolerated.  °

## 2023-03-03 NOTE — Telephone Encounter (Signed)
Pt is requesting a refill for methylphenidate (RITALIN) 20 MG tablet.  Pharmacy:  [facility] [phone]

## 2023-03-04 ENCOUNTER — Ambulatory Visit: Payer: Medicare (Managed Care) | Admitting: Family Medicine

## 2023-03-04 VITALS — BP 124/72 | HR 81 | Temp 98.0°F | Resp 16 | Ht 67.0 in | Wt 289.0 lb

## 2023-03-04 DIAGNOSIS — L089 Local infection of the skin and subcutaneous tissue, unspecified: Secondary | ICD-10-CM | POA: Diagnosis not present

## 2023-03-04 DIAGNOSIS — E669 Obesity, unspecified: Secondary | ICD-10-CM | POA: Diagnosis not present

## 2023-03-04 DIAGNOSIS — G35 Multiple sclerosis: Secondary | ICD-10-CM

## 2023-03-04 DIAGNOSIS — Z23 Encounter for immunization: Secondary | ICD-10-CM | POA: Diagnosis not present

## 2023-03-04 DIAGNOSIS — R739 Hyperglycemia, unspecified: Secondary | ICD-10-CM | POA: Diagnosis not present

## 2023-03-04 DIAGNOSIS — E559 Vitamin D deficiency, unspecified: Secondary | ICD-10-CM

## 2023-03-04 DIAGNOSIS — I10 Essential (primary) hypertension: Secondary | ICD-10-CM

## 2023-03-04 DIAGNOSIS — E782 Mixed hyperlipidemia: Secondary | ICD-10-CM | POA: Diagnosis not present

## 2023-03-04 DIAGNOSIS — T148XXA Other injury of unspecified body region, initial encounter: Secondary | ICD-10-CM | POA: Diagnosis not present

## 2023-03-04 MED ORDER — MONTELUKAST SODIUM 10 MG PO TABS: 10.0000 mg | ORAL_TABLET | Freq: Every day | ORAL | 3 refills | Status: DC

## 2023-03-04 NOTE — Patient Instructions (Signed)

## 2023-03-05 ENCOUNTER — Telehealth: Payer: Self-pay | Admitting: Family Medicine

## 2023-03-05 DIAGNOSIS — F988 Other specified behavioral and emotional disorders with onset usually occurring in childhood and adolescence: Secondary | ICD-10-CM | POA: Diagnosis not present

## 2023-03-05 DIAGNOSIS — Z602 Problems related to living alone: Secondary | ICD-10-CM | POA: Diagnosis not present

## 2023-03-05 DIAGNOSIS — E872 Acidosis, unspecified: Secondary | ICD-10-CM | POA: Diagnosis not present

## 2023-03-05 DIAGNOSIS — E039 Hypothyroidism, unspecified: Secondary | ICD-10-CM | POA: Diagnosis not present

## 2023-03-05 DIAGNOSIS — G9341 Metabolic encephalopathy: Secondary | ICD-10-CM | POA: Diagnosis not present

## 2023-03-05 DIAGNOSIS — Z79899 Other long term (current) drug therapy: Secondary | ICD-10-CM | POA: Diagnosis not present

## 2023-03-05 DIAGNOSIS — F32A Depression, unspecified: Secondary | ICD-10-CM | POA: Diagnosis not present

## 2023-03-05 DIAGNOSIS — M6282 Rhabdomyolysis: Secondary | ICD-10-CM | POA: Diagnosis not present

## 2023-03-05 DIAGNOSIS — M799 Soft tissue disorder, unspecified: Secondary | ICD-10-CM | POA: Diagnosis not present

## 2023-03-05 DIAGNOSIS — I951 Orthostatic hypotension: Secondary | ICD-10-CM | POA: Diagnosis not present

## 2023-03-05 DIAGNOSIS — L03115 Cellulitis of right lower limb: Secondary | ICD-10-CM | POA: Diagnosis not present

## 2023-03-05 DIAGNOSIS — Z556 Problems related to health literacy: Secondary | ICD-10-CM | POA: Diagnosis not present

## 2023-03-05 DIAGNOSIS — D649 Anemia, unspecified: Secondary | ICD-10-CM | POA: Diagnosis not present

## 2023-03-05 LAB — CBC WITH DIFFERENTIAL/PLATELET
Basophils Absolute: 0.1 10*3/uL (ref 0.0–0.1)
Basophils Relative: 0.8 % (ref 0.0–3.0)
Eosinophils Absolute: 0.2 10*3/uL (ref 0.0–0.7)
Eosinophils Relative: 2.2 % (ref 0.0–5.0)
HCT: 32.4 % — ABNORMAL LOW (ref 36.0–46.0)
Hemoglobin: 10.4 g/dL — ABNORMAL LOW (ref 12.0–15.0)
Lymphocytes Relative: 18.8 % (ref 12.0–46.0)
Lymphs Abs: 1.8 10*3/uL (ref 0.7–4.0)
MCHC: 32.1 g/dL (ref 30.0–36.0)
MCV: 88.7 fL (ref 78.0–100.0)
Monocytes Absolute: 0.7 10*3/uL (ref 0.1–1.0)
Monocytes Relative: 7.8 % (ref 3.0–12.0)
Neutro Abs: 6.7 10*3/uL (ref 1.4–7.7)
Neutrophils Relative %: 70.4 % (ref 43.0–77.0)
Platelets: 172 10*3/uL (ref 150.0–400.0)
RBC: 3.65 Mil/uL — ABNORMAL LOW (ref 3.87–5.11)
RDW: 17.3 % — ABNORMAL HIGH (ref 11.5–15.5)
WBC: 9.4 10*3/uL (ref 4.0–10.5)

## 2023-03-05 LAB — COMPREHENSIVE METABOLIC PANEL
ALT: 11 U/L (ref 0–35)
AST: 16 U/L (ref 0–37)
Albumin: 3.7 g/dL (ref 3.5–5.2)
Alkaline Phosphatase: 90 U/L (ref 39–117)
BUN: 22 mg/dL (ref 6–23)
CO2: 27 meq/L (ref 19–32)
Calcium: 10.1 mg/dL (ref 8.4–10.5)
Chloride: 103 meq/L (ref 96–112)
Creatinine, Ser: 1.22 mg/dL — ABNORMAL HIGH (ref 0.40–1.20)
GFR: 45.71 mL/min — ABNORMAL LOW (ref >60.00)
Glucose, Bld: 91 mg/dL (ref 70–99)
Potassium: 4 meq/L (ref 3.5–5.1)
Sodium: 139 meq/L (ref 135–145)
Total Bilirubin: 0.6 mg/dL (ref 0.2–1.2)
Total Protein: 7.2 g/dL (ref 6.0–8.3)

## 2023-03-05 NOTE — Telephone Encounter (Signed)
Caller/Agency: [facility]  Callback Number: [phone] (secure line) Requesting OT/PT/Skilled Nursing/Social Work/Speech Therapy: PT  Frequency: 2x3, 1x5

## 2023-03-07 ENCOUNTER — Encounter: Payer: Self-pay | Admitting: Family Medicine

## 2023-03-07 NOTE — Progress Notes (Signed)
Subjective:    Patient ID: Sydney Pace, female    DOB: 1955/10/21, 67 y.o.   MRN: [id number]  Chief Complaint  Patient presents with   Follow-up    Follow up    HPI Discussed the use of AI scribe software for clinical note transcription with the patient, who gave verbal consent to proceed.  History of Present Illness   The patient, with a history of a wound infection that required hospitalization, presents for a follow-up visit. The patient reports that the wound appears to be healing well with home health wound care. The patient also reports a productive cough that started before leaving the rehabilitation facility. The cough is not associated with fever, chills, or other systemic symptoms. The patient also reports a decreased appetite since being discharged from the hospital. The patient was referred to vascular surgery, but the appointment was canceled for unclear reasons.        Past Medical History:  Diagnosis Date   Anemia    Arthritis    on meds   Arthritis 07/06/2017   Constipation    occ   Dehydration 06/09/2016   Depression    on meds   Esophageal reflux 08/08/2012   on meds   GERD (gastroesophageal reflux disease)    on meds   Hypertension    on meds   Inverted nipple    right nipple has always been inverted - per pt   MS (multiple sclerosis) (HCC)    Neuromuscular disorder (HCC)    MS   Other and unspecified hyperlipidemia 08/08/2012   on meds   Seasonal allergies    Thyroid disease     Past Surgical History:  Procedure Laterality Date   COLONOSCOPY  2020   VC-MAC-prep adeq-TA X 3;   HYSTEROSCOPY  02, 04, 2008   with D&C   ORIF WRIST FRACTURE Right 04/07/2018   Procedure: RIGHT WRIST OPEN REDUCTION INTERNAL FIXATION (ORIF);  Surgeon: Cammy Copa, MD;  Location: [facility] OR;  Service: Orthopedics;  Laterality: Right;   POLYPECTOMY  2020   TA x 3;   TMJ ARTHROSCOPY     TONSILLECTOMY     UPPER GASTROINTESTINAL ENDOSCOPY      Family History   Problem Relation Age of Onset   Heart disease Mother        pacemaker   Emphysema Mother    Hypertension Mother    Heart disease Father    Diabetes Father    Heart disease Sister        cad   Sleep apnea Brother    Colon cancer Neg Hx    Esophageal cancer Neg Hx    Rectal cancer Neg Hx    Stomach cancer Neg Hx    Colon polyps Neg Hx     Social History   Socioeconomic History   Marital status: Single    Spouse name: Not on file   Number of children: Not on file   Years of education: Not on file   Highest education level: Not on file  Occupational History   Not on file  Tobacco Use   Smoking status: Never   Smokeless tobacco: Never  Vaping Use   Vaping status: Never Used  Substance and Sexual Activity   Alcohol use: Not Currently    Alcohol/week: 0.0 - 1.0 standard drinks of alcohol    Comment: occassional   Drug use: No   Sexual activity: Never    Comment: 1st intercourse 89 yo-5 partners  Other  Topics Concern   Not on file  Social History Narrative   Not on file   Social Determinants of Health   Financial Resource Strain: Low Risk  (11/10/2021)   Overall Financial Resource Strain (CARDIA)    Difficulty of Paying Living Expenses: Not hard at all  Food Insecurity: Food Insecurity Present (12/10/2022)   Hunger Vital Sign    Worried About Running Out of Food in the Last Year: Sometimes true    Ran Out of Food in the Last Year: Sometimes true  Transportation Needs: No Transportation Needs (12/10/2022)   PRAPARE - Administrator, Civil Service (Medical): No    Lack of Transportation (Non-Medical): No  Physical Activity: Inactive (12/10/2022)   Exercise Vital Sign    Days of Exercise per Week: 0 days    Minutes of Exercise per Session: 0 min  Stress: No Stress Concern Present (11/10/2021)   [employer] of Occupational Health - Occupational Stress Questionnaire    Feeling of Stress : Not at all  Social Connections: Socially Isolated (11/10/2021)    Social Connection and Isolation Panel [NHANES]    Frequency of Communication with Friends and Family: Once a week    Frequency of Social Gatherings with Friends and Family: Never    Attends Religious Services: Never    Database administrator or Organizations: No    Attends Banker Meetings: Never    Marital Status: Never married  Intimate Partner Violence: Not At Risk (11/10/2021)   Humiliation, Afraid, Rape, and Kick questionnaire    Fear of Current or Ex-Partner: No    Emotionally Abused: No    Physically Abused: No    Sexually Abused: No    Outpatient Medications Prior to Visit  Medication Sig Dispense Refill   ARIPiprazole (ABILIFY) 5 MG tablet Take 1 tablet (5 mg total) by mouth daily. 30 tablet 3   AZO-CRANBERRY PO Take 1 tablet by mouth daily as needed.     brimonidine (ALPHAGAN) 0.2 % ophthalmic solution Place 1 drop into both eyes 2 (two) times daily.     buPROPion (WELLBUTRIN XL) 300 MG 24 hr tablet Take 1 tablet (300 mg total) by mouth daily. 90 tablet 1   Carboxymethylcellul-Glycerin (LUBRICATING EYE DROPS OP) Place 1 drop into both eyes daily as needed (dry eyes).     cetirizine (ZYRTEC) 10 MG tablet Take 10 mg by mouth every evening.      Cholecalciferol (DIALYVITE VITAMIN D 5000 PO) Take 4,000 Units by mouth daily.      fluticasone (FLONASE) 50 MCG/ACT nasal spray USE 2 SPRAY(S) IN EACH NOSTRIL ONCE DAILY AS NEEDED FOR ALLERGIES OR RUNNY NOSE 16 g 5   furosemide (LASIX) 20 MG tablet Take 1 tablet (20 mg total) by mouth daily as needed. 90 tablet 2   gabapentin (NEURONTIN) 600 MG tablet Take one po at night 90 tablet 3   lisinopril-hydrochlorothiazide (ZESTORETIC) 20-25 MG tablet Take 1 tablet by mouth once daily 90 tablet 1   methylphenidate (RITALIN) 20 MG tablet 1 TABLET BY MOUTH IN THE MORNING AND 1 TABLET AT NOON. 60 tablet 0   Multiple Vitamin (MULTIVITAMIN) tablet Take 1 tablet by mouth daily.     mupirocin ointment (BACTROBAN) 2 % Apply 1 Application  topically 2 (two) times daily. 22 g 0   MYRBETRIQ 50 MG TB24 tablet Take 50 mg by mouth daily.     potassium chloride SA (KLOR-CON M) 20 MEQ tablet 1 tab po prn 2nd Furosemide dose  in a day 90 tablet 1   Probiotic Product (PROBIOTIC DAILY PO) Take 1 tablet by mouth daily.     senna-docusate (SENOKOT-S) 8.6-50 MG tablet Take 1 tablet by mouth at bedtime as needed for mild constipation.     sertraline (ZOLOFT) 100 MG tablet TAKE 1 & 1/2 (ONE & ONE-HALF) TABLETS BY MOUTH ONCE DAILY 135 tablet 0   No facility-administered medications prior to visit.    Allergies  Allergen Reactions   Shellfish Allergy Hives, Shortness Of Breath, Itching, Swelling and Rash    Pt had previously carried an epi-pen and had a history of severe reaction to shrimp with breathing problems and swelling of lips and tongue     Review of Systems  Constitutional:  Positive for malaise/fatigue. Negative for fever.  HENT:  Negative for congestion.   Eyes:  Negative for blurred vision.  Respiratory:  Negative for shortness of breath.   Cardiovascular:  Negative for chest pain, palpitations and leg swelling.  Gastrointestinal:  Negative for abdominal pain, blood in stool and nausea.  Genitourinary:  Negative for dysuria and frequency.  Musculoskeletal:  Positive for myalgias. Negative for falls.  Skin:  Positive for rash.  Neurological:  Negative for dizziness, loss of consciousness and headaches.  Endo/Heme/Allergies:  Negative for environmental allergies.  Psychiatric/Behavioral:  Negative for depression. The patient is not nervous/anxious.        Objective:    Physical Exam Constitutional:      General: She is not in acute distress.    Appearance: Normal appearance. She is well-developed. She is not toxic-appearing.  HENT:     Head: Normocephalic and atraumatic.     Right Ear: External ear normal.     Left Ear: External ear normal.     Nose: Nose normal.  Eyes:     General:        Right eye: No discharge.         Left eye: No discharge.     Conjunctiva/sclera: Conjunctivae normal.  Neck:     Thyroid: No thyromegaly.  Cardiovascular:     Rate and Rhythm: Normal rate and regular rhythm.     Heart sounds: Normal heart sounds. No murmur heard. Pulmonary:     Effort: Pulmonary effort is normal. No respiratory distress.     Breath sounds: Normal breath sounds.  Abdominal:     General: Bowel sounds are normal.     Palpations: Abdomen is soft.     Tenderness: There is no abdominal tenderness. There is no guarding.  Musculoskeletal:        General: Normal range of motion.     Cervical back: Neck supple.  Lymphadenopathy:     Cervical: No cervical adenopathy.  Skin:    General: Skin is warm and dry.     Findings: Lesion present.     Comments: Right leg, lower anterior leg. Healing lesion, no surrounding fluctuance or discharge.   Neurological:     Mental Status: She is alert and oriented to person, place, and time.  Psychiatric:        Mood and Affect: Mood normal.        Behavior: Behavior normal.        Thought Content: Thought content normal.        Judgment: Judgment normal.     BP 124/72 (BP Location: Left Arm, Patient Position: Sitting, Cuff Size: Normal)   Pulse 81   Temp 98 F (36.7 C) (Oral)   Resp 16   Ht  5\' 7"  (1.702 m)   Wt 289 lb (131.1 kg)   LMP  (LMP Unknown)   SpO2 97%   BMI 45.26 kg/m  Wt Readings from Last 3 Encounters:  03/04/23 289 lb (131.1 kg)  01/24/23 289 lb 14.5 oz (131.5 kg)  12/31/22 290 lb (131.5 kg)    Diabetic Foot Exam - Simple   No data filed    Lab Results  Component Value Date   WBC 9.4 03/04/2023   HGB 10.4 (L) 03/04/2023   HCT 32.4 (L) 03/04/2023   PLT 172.0 03/04/2023   GLUCOSE 91 03/04/2023   CHOL 154 12/31/2022   TRIG 46.0 12/31/2022   HDL 55.10 12/31/2022   LDLCALC 90 12/31/2022   ALT 11 03/04/2023   AST 16 03/04/2023   NA 139 03/04/2023   K 4.0 03/04/2023   CL 103 03/04/2023   CREATININE 1.22 (H) 03/04/2023   BUN 22  03/04/2023   CO2 27 03/04/2023   TSH 0.89 12/31/2022   HGBA1C 5.8 12/31/2022   MICROALBUR 1.17 01/02/2009    Lab Results  Component Value Date   TSH 0.89 12/31/2022   Lab Results  Component Value Date   WBC 9.4 03/04/2023   HGB 10.4 (L) 03/04/2023   HCT 32.4 (L) 03/04/2023   MCV 88.7 03/04/2023   PLT 172.0 03/04/2023   Lab Results  Component Value Date   NA 139 03/04/2023   K 4.0 03/04/2023   CO2 27 03/04/2023   GLUCOSE 91 03/04/2023   BUN 22 03/04/2023   CREATININE 1.22 (H) 03/04/2023   BILITOT 0.6 03/04/2023   ALKPHOS 90 03/04/2023   AST 16 03/04/2023   ALT 11 03/04/2023   PROT 7.2 03/04/2023   ALBUMIN 3.7 03/04/2023   CALCIUM 10.1 03/04/2023   ANIONGAP 10 02/03/2023   GFR 45.71 (L) 03/04/2023   Lab Results  Component Value Date   CHOL 154 12/31/2022   Lab Results  Component Value Date   HDL 55.10 12/31/2022   Lab Results  Component Value Date   LDLCALC 90 12/31/2022   Lab Results  Component Value Date   TRIG 46.0 12/31/2022   Lab Results  Component Value Date   CHOLHDL 3 12/31/2022   Lab Results  Component Value Date   HGBA1C 5.8 12/31/2022       Assessment & Plan:  Essential hypertension Assessment & Plan: Well controlled, no changes to meds. Encouraged heart healthy diet such as the DASH diet and exercise as tolerated.   Orders: -     Comprehensive metabolic panel -     CBC with Differential/Platelet  Hyperglycemia Assessment & Plan: hgba1c acceptable, minimize simple carbs. Increase exercise as tolerated.    Hyperlipidemia, mixed Assessment & Plan: Encourage heart healthy diet such as MIND or DASH diet, increase exercise, avoid trans fats, simple carbohydrates and processed foods, consider a krill or fish or flaxseed oil cap daily.    Obesity, unspecified class, unspecified obesity type, unspecified whether serious comorbidity present Assessment & Plan: Encouraged DASH or MIND diet, decrease po intake and increase exercise as  tolerated. Needs 7-8 hours of sleep nightly. Avoid trans fats, eat small, frequent meals every 4-5 hours with lean proteins, complex carbs and healthy fats. Minimize simple carbs, high fat foods and processed foods   Vitamin D deficiency Assessment & Plan: Supplement and monitor    Wound infection Assessment & Plan: Recently admitted with sepsis   Multiple sclerosis (HCC) Assessment & Plan: No recent flare   Need for influenza vaccination -  Flu Vaccine Trivalent High Dose (Fluad)  Other orders -     Montelukast Sodium; Take 1 tablet (10 mg total) by mouth at bedtime.  Dispense: 30 tablet; Refill: 3    Assessment and Plan    Leg Wound Healing Recent hospitalization for wound sepsis. Current improvement noted but with some residual leg swelling. Low albumin levels indicating need for increased protein intake for wound healing. -Refer for vascular consultation to ensure adequate circulation for wound healing. -Increase protein intake, take multivitamin and fish or krill oil daily. -Repeat CBC and metabolic panel to monitor white blood cell count and protein levels.  Chronic Cough New onset productive cough without fever, chills, or other respiratory symptoms. Possible allergy-related. -Continue current allergy medications (Zyrtec, Fluticasone). -Add Singulair to allergy regimen. -Monitor for changes in cough, particularly if it becomes associated with fever or changes in sputum.  General Health Maintenance -Plan for COVID-19 booster shot in two weeks. -Schedule virtual follow-up visit in 6-8 weeks, with in-person follow-up 6-8 weeks after that.         Danise Edge, MD

## 2023-03-11 DIAGNOSIS — L03115 Cellulitis of right lower limb: Secondary | ICD-10-CM | POA: Diagnosis not present

## 2023-03-11 DIAGNOSIS — D649 Anemia, unspecified: Secondary | ICD-10-CM | POA: Diagnosis not present

## 2023-03-11 DIAGNOSIS — Z602 Problems related to living alone: Secondary | ICD-10-CM | POA: Diagnosis not present

## 2023-03-11 DIAGNOSIS — M6282 Rhabdomyolysis: Secondary | ICD-10-CM | POA: Diagnosis not present

## 2023-03-11 DIAGNOSIS — Z79899 Other long term (current) drug therapy: Secondary | ICD-10-CM | POA: Diagnosis not present

## 2023-03-11 DIAGNOSIS — E872 Acidosis, unspecified: Secondary | ICD-10-CM | POA: Diagnosis not present

## 2023-03-11 DIAGNOSIS — F32A Depression, unspecified: Secondary | ICD-10-CM | POA: Diagnosis not present

## 2023-03-11 DIAGNOSIS — F988 Other specified behavioral and emotional disorders with onset usually occurring in childhood and adolescence: Secondary | ICD-10-CM | POA: Diagnosis not present

## 2023-03-11 DIAGNOSIS — E039 Hypothyroidism, unspecified: Secondary | ICD-10-CM | POA: Diagnosis not present

## 2023-03-11 DIAGNOSIS — G9341 Metabolic encephalopathy: Secondary | ICD-10-CM | POA: Diagnosis not present

## 2023-03-11 DIAGNOSIS — M799 Soft tissue disorder, unspecified: Secondary | ICD-10-CM | POA: Diagnosis not present

## 2023-03-11 DIAGNOSIS — Z556 Problems related to health literacy: Secondary | ICD-10-CM | POA: Diagnosis not present

## 2023-03-11 DIAGNOSIS — I951 Orthostatic hypotension: Secondary | ICD-10-CM | POA: Diagnosis not present

## 2023-03-12 DIAGNOSIS — F32A Depression, unspecified: Secondary | ICD-10-CM | POA: Diagnosis not present

## 2023-03-12 DIAGNOSIS — Z556 Problems related to health literacy: Secondary | ICD-10-CM | POA: Diagnosis not present

## 2023-03-12 DIAGNOSIS — E872 Acidosis, unspecified: Secondary | ICD-10-CM | POA: Diagnosis not present

## 2023-03-12 DIAGNOSIS — G9341 Metabolic encephalopathy: Secondary | ICD-10-CM | POA: Diagnosis not present

## 2023-03-12 DIAGNOSIS — F988 Other specified behavioral and emotional disorders with onset usually occurring in childhood and adolescence: Secondary | ICD-10-CM | POA: Diagnosis not present

## 2023-03-12 DIAGNOSIS — M6282 Rhabdomyolysis: Secondary | ICD-10-CM | POA: Diagnosis not present

## 2023-03-12 DIAGNOSIS — D649 Anemia, unspecified: Secondary | ICD-10-CM | POA: Diagnosis not present

## 2023-03-12 DIAGNOSIS — I951 Orthostatic hypotension: Secondary | ICD-10-CM | POA: Diagnosis not present

## 2023-03-12 DIAGNOSIS — L03115 Cellulitis of right lower limb: Secondary | ICD-10-CM | POA: Diagnosis not present

## 2023-03-12 DIAGNOSIS — M799 Soft tissue disorder, unspecified: Secondary | ICD-10-CM | POA: Diagnosis not present

## 2023-03-12 DIAGNOSIS — Z79899 Other long term (current) drug therapy: Secondary | ICD-10-CM | POA: Diagnosis not present

## 2023-03-12 DIAGNOSIS — Z602 Problems related to living alone: Secondary | ICD-10-CM | POA: Diagnosis not present

## 2023-03-12 DIAGNOSIS — E039 Hypothyroidism, unspecified: Secondary | ICD-10-CM | POA: Diagnosis not present

## 2023-03-16 DIAGNOSIS — F32A Depression, unspecified: Secondary | ICD-10-CM | POA: Diagnosis not present

## 2023-03-16 DIAGNOSIS — G9341 Metabolic encephalopathy: Secondary | ICD-10-CM | POA: Diagnosis not present

## 2023-03-16 DIAGNOSIS — E039 Hypothyroidism, unspecified: Secondary | ICD-10-CM | POA: Diagnosis not present

## 2023-03-16 DIAGNOSIS — D649 Anemia, unspecified: Secondary | ICD-10-CM | POA: Diagnosis not present

## 2023-03-16 DIAGNOSIS — E872 Acidosis, unspecified: Secondary | ICD-10-CM | POA: Diagnosis not present

## 2023-03-16 DIAGNOSIS — L03115 Cellulitis of right lower limb: Secondary | ICD-10-CM | POA: Diagnosis not present

## 2023-03-16 DIAGNOSIS — M6282 Rhabdomyolysis: Secondary | ICD-10-CM | POA: Diagnosis not present

## 2023-03-16 DIAGNOSIS — Z602 Problems related to living alone: Secondary | ICD-10-CM | POA: Diagnosis not present

## 2023-03-16 DIAGNOSIS — Z556 Problems related to health literacy: Secondary | ICD-10-CM | POA: Diagnosis not present

## 2023-03-16 DIAGNOSIS — F988 Other specified behavioral and emotional disorders with onset usually occurring in childhood and adolescence: Secondary | ICD-10-CM | POA: Diagnosis not present

## 2023-03-16 DIAGNOSIS — Z79899 Other long term (current) drug therapy: Secondary | ICD-10-CM | POA: Diagnosis not present

## 2023-03-16 DIAGNOSIS — M799 Soft tissue disorder, unspecified: Secondary | ICD-10-CM | POA: Diagnosis not present

## 2023-03-16 DIAGNOSIS — I951 Orthostatic hypotension: Secondary | ICD-10-CM | POA: Diagnosis not present

## 2023-03-17 DIAGNOSIS — M799 Soft tissue disorder, unspecified: Secondary | ICD-10-CM | POA: Diagnosis not present

## 2023-03-17 DIAGNOSIS — Z556 Problems related to health literacy: Secondary | ICD-10-CM | POA: Diagnosis not present

## 2023-03-17 DIAGNOSIS — G9341 Metabolic encephalopathy: Secondary | ICD-10-CM | POA: Diagnosis not present

## 2023-03-17 DIAGNOSIS — I951 Orthostatic hypotension: Secondary | ICD-10-CM | POA: Diagnosis not present

## 2023-03-17 DIAGNOSIS — F32A Depression, unspecified: Secondary | ICD-10-CM | POA: Diagnosis not present

## 2023-03-17 DIAGNOSIS — L03115 Cellulitis of right lower limb: Secondary | ICD-10-CM | POA: Diagnosis not present

## 2023-03-17 DIAGNOSIS — E872 Acidosis, unspecified: Secondary | ICD-10-CM | POA: Diagnosis not present

## 2023-03-17 DIAGNOSIS — F988 Other specified behavioral and emotional disorders with onset usually occurring in childhood and adolescence: Secondary | ICD-10-CM | POA: Diagnosis not present

## 2023-03-17 DIAGNOSIS — E039 Hypothyroidism, unspecified: Secondary | ICD-10-CM | POA: Diagnosis not present

## 2023-03-17 DIAGNOSIS — Z602 Problems related to living alone: Secondary | ICD-10-CM | POA: Diagnosis not present

## 2023-03-17 DIAGNOSIS — D649 Anemia, unspecified: Secondary | ICD-10-CM | POA: Diagnosis not present

## 2023-03-17 DIAGNOSIS — M6282 Rhabdomyolysis: Secondary | ICD-10-CM | POA: Diagnosis not present

## 2023-03-17 DIAGNOSIS — Z79899 Other long term (current) drug therapy: Secondary | ICD-10-CM | POA: Diagnosis not present

## 2023-03-18 DIAGNOSIS — G9341 Metabolic encephalopathy: Secondary | ICD-10-CM | POA: Diagnosis not present

## 2023-03-18 DIAGNOSIS — A419 Sepsis, unspecified organism: Secondary | ICD-10-CM | POA: Diagnosis not present

## 2023-03-18 DIAGNOSIS — Z556 Problems related to health literacy: Secondary | ICD-10-CM | POA: Diagnosis not present

## 2023-03-18 DIAGNOSIS — I10 Essential (primary) hypertension: Secondary | ICD-10-CM | POA: Diagnosis not present

## 2023-03-18 DIAGNOSIS — F988 Other specified behavioral and emotional disorders with onset usually occurring in childhood and adolescence: Secondary | ICD-10-CM | POA: Diagnosis not present

## 2023-03-18 DIAGNOSIS — G709 Myoneural disorder, unspecified: Secondary | ICD-10-CM | POA: Diagnosis not present

## 2023-03-18 DIAGNOSIS — Z79899 Other long term (current) drug therapy: Secondary | ICD-10-CM | POA: Diagnosis not present

## 2023-03-18 DIAGNOSIS — E039 Hypothyroidism, unspecified: Secondary | ICD-10-CM | POA: Diagnosis not present

## 2023-03-18 DIAGNOSIS — I951 Orthostatic hypotension: Secondary | ICD-10-CM | POA: Diagnosis not present

## 2023-03-18 DIAGNOSIS — M6282 Rhabdomyolysis: Secondary | ICD-10-CM | POA: Diagnosis not present

## 2023-03-18 DIAGNOSIS — D649 Anemia, unspecified: Secondary | ICD-10-CM | POA: Diagnosis not present

## 2023-03-18 DIAGNOSIS — G35 Multiple sclerosis: Secondary | ICD-10-CM | POA: Diagnosis not present

## 2023-03-18 DIAGNOSIS — L03115 Cellulitis of right lower limb: Secondary | ICD-10-CM | POA: Diagnosis not present

## 2023-03-18 DIAGNOSIS — M799 Soft tissue disorder, unspecified: Secondary | ICD-10-CM | POA: Diagnosis not present

## 2023-03-18 DIAGNOSIS — E872 Acidosis, unspecified: Secondary | ICD-10-CM | POA: Diagnosis not present

## 2023-03-18 DIAGNOSIS — J45909 Unspecified asthma, uncomplicated: Secondary | ICD-10-CM | POA: Diagnosis not present

## 2023-03-18 DIAGNOSIS — F32A Depression, unspecified: Secondary | ICD-10-CM | POA: Diagnosis not present

## 2023-03-18 DIAGNOSIS — R6520 Severe sepsis without septic shock: Secondary | ICD-10-CM | POA: Diagnosis not present

## 2023-03-18 DIAGNOSIS — Z602 Problems related to living alone: Secondary | ICD-10-CM | POA: Diagnosis not present

## 2023-03-18 DIAGNOSIS — E782 Mixed hyperlipidemia: Secondary | ICD-10-CM | POA: Diagnosis not present

## 2023-03-22 DIAGNOSIS — Z556 Problems related to health literacy: Secondary | ICD-10-CM | POA: Diagnosis not present

## 2023-03-22 DIAGNOSIS — Z79899 Other long term (current) drug therapy: Secondary | ICD-10-CM | POA: Diagnosis not present

## 2023-03-22 DIAGNOSIS — Z602 Problems related to living alone: Secondary | ICD-10-CM | POA: Diagnosis not present

## 2023-03-22 DIAGNOSIS — D649 Anemia, unspecified: Secondary | ICD-10-CM | POA: Diagnosis not present

## 2023-03-22 DIAGNOSIS — G9341 Metabolic encephalopathy: Secondary | ICD-10-CM | POA: Diagnosis not present

## 2023-03-22 DIAGNOSIS — F988 Other specified behavioral and emotional disorders with onset usually occurring in childhood and adolescence: Secondary | ICD-10-CM | POA: Diagnosis not present

## 2023-03-22 DIAGNOSIS — F32A Depression, unspecified: Secondary | ICD-10-CM | POA: Diagnosis not present

## 2023-03-22 DIAGNOSIS — E039 Hypothyroidism, unspecified: Secondary | ICD-10-CM | POA: Diagnosis not present

## 2023-03-22 DIAGNOSIS — L03115 Cellulitis of right lower limb: Secondary | ICD-10-CM | POA: Diagnosis not present

## 2023-03-22 DIAGNOSIS — M6282 Rhabdomyolysis: Secondary | ICD-10-CM | POA: Diagnosis not present

## 2023-03-22 DIAGNOSIS — I951 Orthostatic hypotension: Secondary | ICD-10-CM | POA: Diagnosis not present

## 2023-03-22 DIAGNOSIS — E872 Acidosis, unspecified: Secondary | ICD-10-CM | POA: Diagnosis not present

## 2023-03-22 DIAGNOSIS — M799 Soft tissue disorder, unspecified: Secondary | ICD-10-CM | POA: Diagnosis not present

## 2023-03-23 DIAGNOSIS — E039 Hypothyroidism, unspecified: Secondary | ICD-10-CM | POA: Diagnosis not present

## 2023-03-23 DIAGNOSIS — F32A Depression, unspecified: Secondary | ICD-10-CM | POA: Diagnosis not present

## 2023-03-23 DIAGNOSIS — D649 Anemia, unspecified: Secondary | ICD-10-CM | POA: Diagnosis not present

## 2023-03-23 DIAGNOSIS — Z556 Problems related to health literacy: Secondary | ICD-10-CM | POA: Diagnosis not present

## 2023-03-23 DIAGNOSIS — Z79899 Other long term (current) drug therapy: Secondary | ICD-10-CM | POA: Diagnosis not present

## 2023-03-23 DIAGNOSIS — Z602 Problems related to living alone: Secondary | ICD-10-CM | POA: Diagnosis not present

## 2023-03-23 DIAGNOSIS — G9341 Metabolic encephalopathy: Secondary | ICD-10-CM | POA: Diagnosis not present

## 2023-03-23 DIAGNOSIS — E872 Acidosis, unspecified: Secondary | ICD-10-CM | POA: Diagnosis not present

## 2023-03-23 DIAGNOSIS — L03115 Cellulitis of right lower limb: Secondary | ICD-10-CM | POA: Diagnosis not present

## 2023-03-23 DIAGNOSIS — F988 Other specified behavioral and emotional disorders with onset usually occurring in childhood and adolescence: Secondary | ICD-10-CM | POA: Diagnosis not present

## 2023-03-23 DIAGNOSIS — M6282 Rhabdomyolysis: Secondary | ICD-10-CM | POA: Diagnosis not present

## 2023-03-23 DIAGNOSIS — I951 Orthostatic hypotension: Secondary | ICD-10-CM | POA: Diagnosis not present

## 2023-03-23 DIAGNOSIS — M799 Soft tissue disorder, unspecified: Secondary | ICD-10-CM | POA: Diagnosis not present

## 2023-03-25 DIAGNOSIS — M6282 Rhabdomyolysis: Secondary | ICD-10-CM | POA: Diagnosis not present

## 2023-03-25 DIAGNOSIS — F32A Depression, unspecified: Secondary | ICD-10-CM | POA: Diagnosis not present

## 2023-03-25 DIAGNOSIS — D649 Anemia, unspecified: Secondary | ICD-10-CM | POA: Diagnosis not present

## 2023-03-25 DIAGNOSIS — E872 Acidosis, unspecified: Secondary | ICD-10-CM | POA: Diagnosis not present

## 2023-03-25 DIAGNOSIS — E039 Hypothyroidism, unspecified: Secondary | ICD-10-CM | POA: Diagnosis not present

## 2023-03-25 DIAGNOSIS — F988 Other specified behavioral and emotional disorders with onset usually occurring in childhood and adolescence: Secondary | ICD-10-CM | POA: Diagnosis not present

## 2023-03-25 DIAGNOSIS — G9341 Metabolic encephalopathy: Secondary | ICD-10-CM | POA: Diagnosis not present

## 2023-03-25 DIAGNOSIS — M799 Soft tissue disorder, unspecified: Secondary | ICD-10-CM | POA: Diagnosis not present

## 2023-03-25 DIAGNOSIS — Z556 Problems related to health literacy: Secondary | ICD-10-CM | POA: Diagnosis not present

## 2023-03-25 DIAGNOSIS — Z79899 Other long term (current) drug therapy: Secondary | ICD-10-CM | POA: Diagnosis not present

## 2023-03-25 DIAGNOSIS — Z602 Problems related to living alone: Secondary | ICD-10-CM | POA: Diagnosis not present

## 2023-03-25 DIAGNOSIS — L03115 Cellulitis of right lower limb: Secondary | ICD-10-CM | POA: Diagnosis not present

## 2023-03-25 DIAGNOSIS — I951 Orthostatic hypotension: Secondary | ICD-10-CM | POA: Diagnosis not present

## 2023-03-26 DIAGNOSIS — D649 Anemia, unspecified: Secondary | ICD-10-CM | POA: Diagnosis not present

## 2023-03-26 DIAGNOSIS — G9341 Metabolic encephalopathy: Secondary | ICD-10-CM | POA: Diagnosis not present

## 2023-03-26 DIAGNOSIS — Z602 Problems related to living alone: Secondary | ICD-10-CM | POA: Diagnosis not present

## 2023-03-26 DIAGNOSIS — I951 Orthostatic hypotension: Secondary | ICD-10-CM | POA: Diagnosis not present

## 2023-03-26 DIAGNOSIS — M6282 Rhabdomyolysis: Secondary | ICD-10-CM | POA: Diagnosis not present

## 2023-03-26 DIAGNOSIS — Z556 Problems related to health literacy: Secondary | ICD-10-CM | POA: Diagnosis not present

## 2023-03-26 DIAGNOSIS — M799 Soft tissue disorder, unspecified: Secondary | ICD-10-CM | POA: Diagnosis not present

## 2023-03-26 DIAGNOSIS — F988 Other specified behavioral and emotional disorders with onset usually occurring in childhood and adolescence: Secondary | ICD-10-CM | POA: Diagnosis not present

## 2023-03-26 DIAGNOSIS — F32A Depression, unspecified: Secondary | ICD-10-CM | POA: Diagnosis not present

## 2023-03-26 DIAGNOSIS — E039 Hypothyroidism, unspecified: Secondary | ICD-10-CM | POA: Diagnosis not present

## 2023-03-26 DIAGNOSIS — E872 Acidosis, unspecified: Secondary | ICD-10-CM | POA: Diagnosis not present

## 2023-03-26 DIAGNOSIS — L03115 Cellulitis of right lower limb: Secondary | ICD-10-CM | POA: Diagnosis not present

## 2023-03-26 DIAGNOSIS — Z79899 Other long term (current) drug therapy: Secondary | ICD-10-CM | POA: Diagnosis not present

## 2023-03-30 DIAGNOSIS — F32A Depression, unspecified: Secondary | ICD-10-CM | POA: Diagnosis not present

## 2023-03-30 DIAGNOSIS — F988 Other specified behavioral and emotional disorders with onset usually occurring in childhood and adolescence: Secondary | ICD-10-CM | POA: Diagnosis not present

## 2023-03-30 DIAGNOSIS — D649 Anemia, unspecified: Secondary | ICD-10-CM | POA: Diagnosis not present

## 2023-03-30 DIAGNOSIS — L03115 Cellulitis of right lower limb: Secondary | ICD-10-CM | POA: Diagnosis not present

## 2023-03-30 DIAGNOSIS — M6282 Rhabdomyolysis: Secondary | ICD-10-CM | POA: Diagnosis not present

## 2023-03-30 DIAGNOSIS — E039 Hypothyroidism, unspecified: Secondary | ICD-10-CM | POA: Diagnosis not present

## 2023-03-30 DIAGNOSIS — I951 Orthostatic hypotension: Secondary | ICD-10-CM | POA: Diagnosis not present

## 2023-03-30 DIAGNOSIS — E872 Acidosis, unspecified: Secondary | ICD-10-CM | POA: Diagnosis not present

## 2023-03-30 DIAGNOSIS — Z556 Problems related to health literacy: Secondary | ICD-10-CM | POA: Diagnosis not present

## 2023-03-30 DIAGNOSIS — Z79899 Other long term (current) drug therapy: Secondary | ICD-10-CM | POA: Diagnosis not present

## 2023-03-30 DIAGNOSIS — Z602 Problems related to living alone: Secondary | ICD-10-CM | POA: Diagnosis not present

## 2023-03-30 DIAGNOSIS — M799 Soft tissue disorder, unspecified: Secondary | ICD-10-CM | POA: Diagnosis not present

## 2023-03-30 DIAGNOSIS — G9341 Metabolic encephalopathy: Secondary | ICD-10-CM | POA: Diagnosis not present

## 2023-04-01 DIAGNOSIS — I951 Orthostatic hypotension: Secondary | ICD-10-CM | POA: Diagnosis not present

## 2023-04-01 DIAGNOSIS — Z79899 Other long term (current) drug therapy: Secondary | ICD-10-CM | POA: Diagnosis not present

## 2023-04-01 DIAGNOSIS — E039 Hypothyroidism, unspecified: Secondary | ICD-10-CM | POA: Diagnosis not present

## 2023-04-01 DIAGNOSIS — Z602 Problems related to living alone: Secondary | ICD-10-CM | POA: Diagnosis not present

## 2023-04-01 DIAGNOSIS — M6282 Rhabdomyolysis: Secondary | ICD-10-CM | POA: Diagnosis not present

## 2023-04-01 DIAGNOSIS — Z556 Problems related to health literacy: Secondary | ICD-10-CM | POA: Diagnosis not present

## 2023-04-01 DIAGNOSIS — E872 Acidosis, unspecified: Secondary | ICD-10-CM | POA: Diagnosis not present

## 2023-04-01 DIAGNOSIS — D649 Anemia, unspecified: Secondary | ICD-10-CM | POA: Diagnosis not present

## 2023-04-01 DIAGNOSIS — M799 Soft tissue disorder, unspecified: Secondary | ICD-10-CM | POA: Diagnosis not present

## 2023-04-01 DIAGNOSIS — F32A Depression, unspecified: Secondary | ICD-10-CM | POA: Diagnosis not present

## 2023-04-01 DIAGNOSIS — L03115 Cellulitis of right lower limb: Secondary | ICD-10-CM | POA: Diagnosis not present

## 2023-04-01 DIAGNOSIS — G9341 Metabolic encephalopathy: Secondary | ICD-10-CM | POA: Diagnosis not present

## 2023-04-01 DIAGNOSIS — F988 Other specified behavioral and emotional disorders with onset usually occurring in childhood and adolescence: Secondary | ICD-10-CM | POA: Diagnosis not present

## 2023-04-02 DIAGNOSIS — F988 Other specified behavioral and emotional disorders with onset usually occurring in childhood and adolescence: Secondary | ICD-10-CM | POA: Diagnosis not present

## 2023-04-02 DIAGNOSIS — E872 Acidosis, unspecified: Secondary | ICD-10-CM | POA: Diagnosis not present

## 2023-04-02 DIAGNOSIS — Z556 Problems related to health literacy: Secondary | ICD-10-CM | POA: Diagnosis not present

## 2023-04-02 DIAGNOSIS — G9341 Metabolic encephalopathy: Secondary | ICD-10-CM | POA: Diagnosis not present

## 2023-04-02 DIAGNOSIS — D649 Anemia, unspecified: Secondary | ICD-10-CM | POA: Diagnosis not present

## 2023-04-02 DIAGNOSIS — L03115 Cellulitis of right lower limb: Secondary | ICD-10-CM | POA: Diagnosis not present

## 2023-04-02 DIAGNOSIS — Z602 Problems related to living alone: Secondary | ICD-10-CM | POA: Diagnosis not present

## 2023-04-02 DIAGNOSIS — M6282 Rhabdomyolysis: Secondary | ICD-10-CM | POA: Diagnosis not present

## 2023-04-02 DIAGNOSIS — Z79899 Other long term (current) drug therapy: Secondary | ICD-10-CM | POA: Diagnosis not present

## 2023-04-02 DIAGNOSIS — F32A Depression, unspecified: Secondary | ICD-10-CM | POA: Diagnosis not present

## 2023-04-02 DIAGNOSIS — E039 Hypothyroidism, unspecified: Secondary | ICD-10-CM | POA: Diagnosis not present

## 2023-04-02 DIAGNOSIS — I951 Orthostatic hypotension: Secondary | ICD-10-CM | POA: Diagnosis not present

## 2023-04-02 DIAGNOSIS — M799 Soft tissue disorder, unspecified: Secondary | ICD-10-CM | POA: Diagnosis not present

## 2023-04-06 DIAGNOSIS — Z79899 Other long term (current) drug therapy: Secondary | ICD-10-CM | POA: Diagnosis not present

## 2023-04-06 DIAGNOSIS — L03115 Cellulitis of right lower limb: Secondary | ICD-10-CM | POA: Diagnosis not present

## 2023-04-06 DIAGNOSIS — F32A Depression, unspecified: Secondary | ICD-10-CM | POA: Diagnosis not present

## 2023-04-06 DIAGNOSIS — Z602 Problems related to living alone: Secondary | ICD-10-CM | POA: Diagnosis not present

## 2023-04-06 DIAGNOSIS — E039 Hypothyroidism, unspecified: Secondary | ICD-10-CM | POA: Diagnosis not present

## 2023-04-06 DIAGNOSIS — Z556 Problems related to health literacy: Secondary | ICD-10-CM | POA: Diagnosis not present

## 2023-04-06 DIAGNOSIS — G9341 Metabolic encephalopathy: Secondary | ICD-10-CM | POA: Diagnosis not present

## 2023-04-06 DIAGNOSIS — D649 Anemia, unspecified: Secondary | ICD-10-CM | POA: Diagnosis not present

## 2023-04-06 DIAGNOSIS — I951 Orthostatic hypotension: Secondary | ICD-10-CM | POA: Diagnosis not present

## 2023-04-06 DIAGNOSIS — M6282 Rhabdomyolysis: Secondary | ICD-10-CM | POA: Diagnosis not present

## 2023-04-06 DIAGNOSIS — M799 Soft tissue disorder, unspecified: Secondary | ICD-10-CM | POA: Diagnosis not present

## 2023-04-06 DIAGNOSIS — E872 Acidosis, unspecified: Secondary | ICD-10-CM | POA: Diagnosis not present

## 2023-04-06 DIAGNOSIS — F988 Other specified behavioral and emotional disorders with onset usually occurring in childhood and adolescence: Secondary | ICD-10-CM | POA: Diagnosis not present

## 2023-04-08 DIAGNOSIS — F32A Depression, unspecified: Secondary | ICD-10-CM | POA: Diagnosis not present

## 2023-04-08 DIAGNOSIS — E039 Hypothyroidism, unspecified: Secondary | ICD-10-CM | POA: Diagnosis not present

## 2023-04-08 DIAGNOSIS — M6282 Rhabdomyolysis: Secondary | ICD-10-CM | POA: Diagnosis not present

## 2023-04-08 DIAGNOSIS — I951 Orthostatic hypotension: Secondary | ICD-10-CM | POA: Diagnosis not present

## 2023-04-08 DIAGNOSIS — Z79899 Other long term (current) drug therapy: Secondary | ICD-10-CM | POA: Diagnosis not present

## 2023-04-08 DIAGNOSIS — L03115 Cellulitis of right lower limb: Secondary | ICD-10-CM | POA: Diagnosis not present

## 2023-04-08 DIAGNOSIS — E872 Acidosis, unspecified: Secondary | ICD-10-CM | POA: Diagnosis not present

## 2023-04-08 DIAGNOSIS — G9341 Metabolic encephalopathy: Secondary | ICD-10-CM | POA: Diagnosis not present

## 2023-04-08 DIAGNOSIS — F988 Other specified behavioral and emotional disorders with onset usually occurring in childhood and adolescence: Secondary | ICD-10-CM | POA: Diagnosis not present

## 2023-04-08 DIAGNOSIS — M799 Soft tissue disorder, unspecified: Secondary | ICD-10-CM | POA: Diagnosis not present

## 2023-04-08 DIAGNOSIS — Z556 Problems related to health literacy: Secondary | ICD-10-CM | POA: Diagnosis not present

## 2023-04-08 DIAGNOSIS — Z602 Problems related to living alone: Secondary | ICD-10-CM | POA: Diagnosis not present

## 2023-04-08 DIAGNOSIS — D649 Anemia, unspecified: Secondary | ICD-10-CM | POA: Diagnosis not present

## 2023-04-09 DIAGNOSIS — M799 Soft tissue disorder, unspecified: Secondary | ICD-10-CM | POA: Diagnosis not present

## 2023-04-09 DIAGNOSIS — Z79899 Other long term (current) drug therapy: Secondary | ICD-10-CM | POA: Diagnosis not present

## 2023-04-09 DIAGNOSIS — L03115 Cellulitis of right lower limb: Secondary | ICD-10-CM | POA: Diagnosis not present

## 2023-04-09 DIAGNOSIS — Z556 Problems related to health literacy: Secondary | ICD-10-CM | POA: Diagnosis not present

## 2023-04-09 DIAGNOSIS — F32A Depression, unspecified: Secondary | ICD-10-CM | POA: Diagnosis not present

## 2023-04-09 DIAGNOSIS — G9341 Metabolic encephalopathy: Secondary | ICD-10-CM | POA: Diagnosis not present

## 2023-04-09 DIAGNOSIS — I951 Orthostatic hypotension: Secondary | ICD-10-CM | POA: Diagnosis not present

## 2023-04-09 DIAGNOSIS — M6282 Rhabdomyolysis: Secondary | ICD-10-CM | POA: Diagnosis not present

## 2023-04-09 DIAGNOSIS — E872 Acidosis, unspecified: Secondary | ICD-10-CM | POA: Diagnosis not present

## 2023-04-09 DIAGNOSIS — D649 Anemia, unspecified: Secondary | ICD-10-CM | POA: Diagnosis not present

## 2023-04-09 DIAGNOSIS — E039 Hypothyroidism, unspecified: Secondary | ICD-10-CM | POA: Diagnosis not present

## 2023-04-09 DIAGNOSIS — F988 Other specified behavioral and emotional disorders with onset usually occurring in childhood and adolescence: Secondary | ICD-10-CM | POA: Diagnosis not present

## 2023-04-09 DIAGNOSIS — Z602 Problems related to living alone: Secondary | ICD-10-CM | POA: Diagnosis not present

## 2023-04-13 DIAGNOSIS — G9341 Metabolic encephalopathy: Secondary | ICD-10-CM | POA: Diagnosis not present

## 2023-04-13 DIAGNOSIS — I951 Orthostatic hypotension: Secondary | ICD-10-CM | POA: Diagnosis not present

## 2023-04-13 DIAGNOSIS — Z602 Problems related to living alone: Secondary | ICD-10-CM | POA: Diagnosis not present

## 2023-04-13 DIAGNOSIS — E039 Hypothyroidism, unspecified: Secondary | ICD-10-CM | POA: Diagnosis not present

## 2023-04-13 DIAGNOSIS — F32A Depression, unspecified: Secondary | ICD-10-CM | POA: Diagnosis not present

## 2023-04-13 DIAGNOSIS — D649 Anemia, unspecified: Secondary | ICD-10-CM | POA: Diagnosis not present

## 2023-04-13 DIAGNOSIS — L03115 Cellulitis of right lower limb: Secondary | ICD-10-CM | POA: Diagnosis not present

## 2023-04-13 DIAGNOSIS — Z79899 Other long term (current) drug therapy: Secondary | ICD-10-CM | POA: Diagnosis not present

## 2023-04-13 DIAGNOSIS — F988 Other specified behavioral and emotional disorders with onset usually occurring in childhood and adolescence: Secondary | ICD-10-CM | POA: Diagnosis not present

## 2023-04-13 DIAGNOSIS — Z556 Problems related to health literacy: Secondary | ICD-10-CM | POA: Diagnosis not present

## 2023-04-13 DIAGNOSIS — M6282 Rhabdomyolysis: Secondary | ICD-10-CM | POA: Diagnosis not present

## 2023-04-13 DIAGNOSIS — E872 Acidosis, unspecified: Secondary | ICD-10-CM | POA: Diagnosis not present

## 2023-04-13 DIAGNOSIS — M799 Soft tissue disorder, unspecified: Secondary | ICD-10-CM | POA: Diagnosis not present

## 2023-04-19 ENCOUNTER — Telehealth: Payer: Medicare (Managed Care) | Admitting: Family Medicine

## 2023-04-23 DIAGNOSIS — D649 Anemia, unspecified: Secondary | ICD-10-CM | POA: Diagnosis not present

## 2023-04-23 DIAGNOSIS — F988 Other specified behavioral and emotional disorders with onset usually occurring in childhood and adolescence: Secondary | ICD-10-CM | POA: Diagnosis not present

## 2023-04-23 DIAGNOSIS — Z79899 Other long term (current) drug therapy: Secondary | ICD-10-CM | POA: Diagnosis not present

## 2023-04-23 DIAGNOSIS — M6282 Rhabdomyolysis: Secondary | ICD-10-CM | POA: Diagnosis not present

## 2023-04-23 DIAGNOSIS — E872 Acidosis, unspecified: Secondary | ICD-10-CM | POA: Diagnosis not present

## 2023-04-23 DIAGNOSIS — F32A Depression, unspecified: Secondary | ICD-10-CM | POA: Diagnosis not present

## 2023-04-23 DIAGNOSIS — Z556 Problems related to health literacy: Secondary | ICD-10-CM | POA: Diagnosis not present

## 2023-04-23 DIAGNOSIS — E039 Hypothyroidism, unspecified: Secondary | ICD-10-CM | POA: Diagnosis not present

## 2023-04-23 DIAGNOSIS — L03115 Cellulitis of right lower limb: Secondary | ICD-10-CM | POA: Diagnosis not present

## 2023-04-23 DIAGNOSIS — G9341 Metabolic encephalopathy: Secondary | ICD-10-CM | POA: Diagnosis not present

## 2023-04-23 DIAGNOSIS — I951 Orthostatic hypotension: Secondary | ICD-10-CM | POA: Diagnosis not present

## 2023-04-23 DIAGNOSIS — Z602 Problems related to living alone: Secondary | ICD-10-CM | POA: Diagnosis not present

## 2023-04-23 DIAGNOSIS — M799 Soft tissue disorder, unspecified: Secondary | ICD-10-CM | POA: Diagnosis not present

## 2023-04-26 DIAGNOSIS — E039 Hypothyroidism, unspecified: Secondary | ICD-10-CM | POA: Diagnosis not present

## 2023-04-26 DIAGNOSIS — F988 Other specified behavioral and emotional disorders with onset usually occurring in childhood and adolescence: Secondary | ICD-10-CM | POA: Diagnosis not present

## 2023-04-26 DIAGNOSIS — Z602 Problems related to living alone: Secondary | ICD-10-CM | POA: Diagnosis not present

## 2023-04-26 DIAGNOSIS — L03115 Cellulitis of right lower limb: Secondary | ICD-10-CM | POA: Diagnosis not present

## 2023-04-26 DIAGNOSIS — M799 Soft tissue disorder, unspecified: Secondary | ICD-10-CM | POA: Diagnosis not present

## 2023-04-26 DIAGNOSIS — F32A Depression, unspecified: Secondary | ICD-10-CM | POA: Diagnosis not present

## 2023-04-26 DIAGNOSIS — I951 Orthostatic hypotension: Secondary | ICD-10-CM | POA: Diagnosis not present

## 2023-04-26 DIAGNOSIS — D649 Anemia, unspecified: Secondary | ICD-10-CM | POA: Diagnosis not present

## 2023-04-26 DIAGNOSIS — Z556 Problems related to health literacy: Secondary | ICD-10-CM | POA: Diagnosis not present

## 2023-04-26 DIAGNOSIS — E872 Acidosis, unspecified: Secondary | ICD-10-CM | POA: Diagnosis not present

## 2023-04-26 DIAGNOSIS — Z79899 Other long term (current) drug therapy: Secondary | ICD-10-CM | POA: Diagnosis not present

## 2023-04-26 DIAGNOSIS — M6282 Rhabdomyolysis: Secondary | ICD-10-CM | POA: Diagnosis not present

## 2023-04-26 DIAGNOSIS — G9341 Metabolic encephalopathy: Secondary | ICD-10-CM | POA: Diagnosis not present

## 2023-05-03 ENCOUNTER — Telehealth: Payer: Self-pay | Admitting: *Deleted

## 2023-05-03 ENCOUNTER — Ambulatory Visit: Payer: Medicare (Managed Care) | Admitting: Physician Assistant

## 2023-05-03 ENCOUNTER — Other Ambulatory Visit (HOSPITAL_COMMUNITY): Payer: Self-pay

## 2023-05-03 ENCOUNTER — Other Ambulatory Visit: Payer: Self-pay | Admitting: Physician Assistant

## 2023-05-03 ENCOUNTER — Ambulatory Visit (HOSPITAL_COMMUNITY)
Admission: RE | Admit: 2023-05-03 | Discharge: 2023-05-03 | Disposition: A | Discharge status: Home / Self Care | Payer: Medicare (Managed Care) | Source: Ambulatory Visit | Attending: Physician Assistant | Admitting: Physician Assistant

## 2023-05-03 VITALS — BP 152/79 | HR 83 | Temp 98.1°F | Ht 67.0 in | Wt 270.0 lb

## 2023-05-03 DIAGNOSIS — M7989 Other specified soft tissue disorders: Secondary | ICD-10-CM

## 2023-05-03 DIAGNOSIS — S81809A Unspecified open wound, unspecified lower leg, initial encounter: Secondary | ICD-10-CM | POA: Diagnosis not present

## 2023-05-03 DIAGNOSIS — I10 Essential (primary) hypertension: Secondary | ICD-10-CM | POA: Diagnosis not present

## 2023-05-03 DIAGNOSIS — E785 Hyperlipidemia, unspecified: Secondary | ICD-10-CM | POA: Diagnosis not present

## 2023-05-03 DIAGNOSIS — L97818 Non-pressure chronic ulcer of other part of right lower leg with other specified severity: Secondary | ICD-10-CM | POA: Diagnosis not present

## 2023-05-03 DIAGNOSIS — S81801A Unspecified open wound, right lower leg, initial encounter: Secondary | ICD-10-CM

## 2023-05-03 DIAGNOSIS — R600 Localized edema: Secondary | ICD-10-CM | POA: Insufficient documentation

## 2023-05-03 LAB — VAS US ABI WITH/WO TBI
Left ABI: 1.07
Right ABI: 1.11

## 2023-05-03 NOTE — Progress Notes (Signed)
HISTORY AND PHYSICAL     CC:  follow up. Requesting Provider:  Bradd Canary, MD  HPI: This is a 67 y.o. female who is here today for evaluation of right lower leg wound.    The pt was hospitalized in August/September with bacteremia with Staph epidermidis and Achromobacter xylosoxidans and extensive cellulitis of the lower legs.  ID was consulted and she was placed on appropriate IV abx.    She was seen by her PCP in early October and a  referral was placed to be evaluated by vascular.  A venous duplex was ordered by her PCP, however, this was changed to ABI to evaluate her arterial flow given ulceration.   Pt states that these started out as blisters that popped and now is almost healed.  There is no drainage from the wound.  She does have discoloration of her lower legs with right worse than left.  She also had wounds on the left leg and these have completely healed.  She denies any claudication or rest pain.  She states she does have swelling and it does improve after having elevated her legs.  Her nephew states they did use a compression wrap to help with the swelling for the wounds but pt did not remember this.  She states this wound was very painful and much better now.  She has never had a DVT.  There is no family hx of varicose veins that she is aware of.  She denies any hx of chest pain, MI or stroke.    Her PMH significant for morbid obesity, multiple sclerosis, HTN, GERD, hypothyroidism, depression, constipation.  The pt is not on a statin for cholesterol management.    The pt is not on an aspirin.    Other AC:  none The pt is not on medication for hypertension.  The pt is not on medication for diabetes. Tobacco hx:  never    Past Medical History:  Diagnosis Date   Anemia    Arthritis    on meds   Arthritis 07/06/2017   Constipation    occ   Dehydration 06/09/2016   Depression    on meds   Esophageal reflux 08/08/2012   on meds   GERD (gastroesophageal reflux  disease)    on meds   Hypertension    on meds   Inverted nipple    right nipple has always been inverted - per pt   MS (multiple sclerosis) (HCC)    Neuromuscular disorder (HCC)    MS   Other and unspecified hyperlipidemia 08/08/2012   on meds   Seasonal allergies    Thyroid disease     Past Surgical History:  Procedure Laterality Date   COLONOSCOPY  2020   VC-MAC-prep adeq-TA X 3;   HYSTEROSCOPY  02, 04, 2008   with D&C   ORIF WRIST FRACTURE Right 04/07/2018   Procedure: RIGHT WRIST OPEN REDUCTION INTERNAL FIXATION (ORIF);  Surgeon: Cammy Copa, MD;  Location: [facility] OR;  Service: Orthopedics;  Laterality: Right;   POLYPECTOMY  2020   TA x 3;   TMJ ARTHROSCOPY     TONSILLECTOMY     UPPER GASTROINTESTINAL ENDOSCOPY      Allergies  Allergen Reactions   Shellfish Allergy Hives, Shortness Of Breath, Itching, Swelling and Rash    Pt had previously carried an epi-pen and had a history of severe reaction to shrimp with breathing problems and swelling of lips and tongue     Current Outpatient Medications  Medication Sig Dispense Refill   ARIPiprazole (ABILIFY) 5 MG tablet Take 1 tablet (5 mg total) by mouth daily. 30 tablet 3   AZO-CRANBERRY PO Take 1 tablet by mouth daily as needed.     brimonidine (ALPHAGAN) 0.2 % ophthalmic solution Place 1 drop into both eyes 2 (two) times daily.     buPROPion (WELLBUTRIN XL) 300 MG 24 hr tablet Take 1 tablet (300 mg total) by mouth daily. 90 tablet 1   Carboxymethylcellul-Glycerin (LUBRICATING EYE DROPS OP) Place 1 drop into both eyes daily as needed (dry eyes).     cetirizine (ZYRTEC) 10 MG tablet Take 10 mg by mouth every evening.      Cholecalciferol (DIALYVITE VITAMIN D 5000 PO) Take 4,000 Units by mouth daily.      fluticasone (FLONASE) 50 MCG/ACT nasal spray USE 2 SPRAY(S) IN EACH NOSTRIL ONCE DAILY AS NEEDED FOR ALLERGIES OR RUNNY NOSE 16 g 5   furosemide (LASIX) 20 MG tablet Take 1 tablet (20 mg total) by mouth daily as needed.  90 tablet 2   gabapentin (NEURONTIN) 600 MG tablet Take one po at night 90 tablet 3   lisinopril-hydrochlorothiazide (ZESTORETIC) 20-25 MG tablet Take 1 tablet by mouth once daily 90 tablet 1   methylphenidate (RITALIN) 20 MG tablet 1 TABLET BY MOUTH IN THE MORNING AND 1 TABLET AT NOON. 60 tablet 0   montelukast (SINGULAIR) 10 MG tablet Take 1 tablet (10 mg total) by mouth at bedtime. 30 tablet 3   Multiple Vitamin (MULTIVITAMIN) tablet Take 1 tablet by mouth daily.     mupirocin ointment (BACTROBAN) 2 % Apply 1 Application topically 2 (two) times daily. 22 g 0   MYRBETRIQ 50 MG TB24 tablet Take 50 mg by mouth daily.     potassium chloride SA (KLOR-CON M) 20 MEQ tablet 1 tab po prn 2nd Furosemide dose in a day 90 tablet 1   Probiotic Product (PROBIOTIC DAILY PO) Take 1 tablet by mouth daily.     senna-docusate (SENOKOT-S) 8.6-50 MG tablet Take 1 tablet by mouth at bedtime as needed for mild constipation.     sertraline (ZOLOFT) 100 MG tablet TAKE 1 & 1/2 (ONE & ONE-HALF) TABLETS BY MOUTH ONCE DAILY 135 tablet 0   No current facility-administered medications for this visit.    Family History  Problem Relation Age of Onset   Heart disease Mother        pacemaker   Emphysema Mother    Hypertension Mother    Heart disease Father    Diabetes Father    Heart disease Sister        cad   Sleep apnea Brother    Colon cancer Neg Hx    Esophageal cancer Neg Hx    Rectal cancer Neg Hx    Stomach cancer Neg Hx    Colon polyps Neg Hx     Social History   Socioeconomic History   Marital status: Single    Spouse name: Not on file   Number of children: Not on file   Years of education: Not on file   Highest education level: Not on file  Occupational History   Not on file  Tobacco Use   Smoking status: Never   Smokeless tobacco: Never  Vaping Use   Vaping status: Never Used  Substance and Sexual Activity   Alcohol use: Not Currently    Alcohol/week: 0.0 - 1.0 standard drinks of  alcohol    Comment: occassional   Drug use: No  Sexual activity: Never    Comment: 1st intercourse 48 yo-5 partners  Other Topics Concern   Not on file  Social History Narrative   Not on file   Social Determinants of Health   Financial Resource Strain: Low Risk  (11/10/2021)   Overall Financial Resource Strain (CARDIA)    Difficulty of Paying Living Expenses: Not hard at all  Food Insecurity: Food Insecurity Present (12/10/2022)   Hunger Vital Sign    Worried About Running Out of Food in the Last Year: Sometimes true    Ran Out of Food in the Last Year: Sometimes true  Transportation Needs: No Transportation Needs (12/10/2022)   PRAPARE - Administrator, Civil Service (Medical): No    Lack of Transportation (Non-Medical): No  Physical Activity: Inactive (12/10/2022)   Exercise Vital Sign    Days of Exercise per Week: 0 days    Minutes of Exercise per Session: 0 min  Stress: No Stress Concern Present (11/10/2021)   [employer] of Occupational Health - Occupational Stress Questionnaire    Feeling of Stress : Not at all  Social Connections: Socially Isolated (11/10/2021)   Social Connection and Isolation Panel [NHANES]    Frequency of Communication with Friends and Family: Once a week    Frequency of Social Gatherings with Friends and Family: Never    Attends Religious Services: Never    Database administrator or Organizations: No    Attends Banker Meetings: Never    Marital Status: Never married  Intimate Partner Violence: Not At Risk (11/10/2021)   Humiliation, Afraid, Rape, and Kick questionnaire    Fear of Current or Ex-Partner: No    Emotionally Abused: No    Physically Abused: No    Sexually Abused: No     REVIEW OF SYSTEMS:   [X]  denotes positive finding, [ ]  denotes negative finding Cardiac  Comments:  Chest pain or chest pressure:    Shortness of breath upon exertion:    Short of breath when lying flat:    Irregular heart rhythm:         Vascular    Pain in calf, thigh, or hip brought on by ambulation:    Pain in feet at night that wakes you up from your sleep:     Blood clot in your veins:    Leg swelling:  x       Pulmonary    Oxygen at home:    Productive cough:     Wheezing:         Neurologic    Sudden weakness in arms or legs:     Sudden numbness in arms or legs:     Sudden onset of difficulty speaking or slurred speech:    Temporary loss of vision in one eye:     Problems with dizziness:         Gastrointestinal    Blood in stool:     Vomited blood:         Genitourinary    Burning when urinating:     Blood in urine:        Psychiatric    Major depression:         Hematologic    Bleeding problems:    Problems with blood clotting too easily:        Skin    Rashes or ulcers: x       Constitutional    Fever or chills:  PHYSICAL EXAMINATION:  Today's Vitals   05/03/23 1449  BP: (!) 152/79  Pulse: 83  Temp: 98.1 F (36.7 C)  TempSrc: Temporal  SpO2: 98%  Weight: 270 lb (122.5 kg)  Height: 5\' 7"  (1.702 m)   Body mass index is 42.29 kg/m.   General:  WDWN in NAD; vital signs documented above Gait: Not observed HENT: WNL, normocephalic Pulmonary: normal non-labored breathing , without wheezing Cardiac: regular HR, without carotid bruits Abdomen: obese Skin: with hemosiderin staining right leg Vascular Exam/Pulses:  Right Left  Radial 2+ (normal) 2+ (normal)  DP 2+ (normal) 2+ (normal)   Extremities: without ischemic changes, without Gangrene , without cellulitis; nearly healed wound right lower leg as pictured      Musculoskeletal: no muscle wasting or atrophy  Neurologic: A&O X 3 Psychiatric:  The pt has Normal affect.   Non-Invasive Vascular Imaging:   ABI's/TBI's on 05/03/2023: Right:  1.11/0.61 - Great toe pressure: 97 Left:  1.07/0.59 - Great toe pressure: 94    ASSESSMENT/PLAN:: 67 y.o. female here for follow up for PAD with hx of non healing wound  to BLE with right worse than left    -pt with slow healing ulceration right lower leg that is now nearly healed.  She did have ABI showing normal arterial flow with easily palpable DP pulses bilaterally.  -she does have swelling in both legs with right worse than left with hemosiderin staining.  Discussed that she most likely does have venous insufficiency, which can cause venous ulcerations.  I have recommended 15-46mmHg knee high compression socks and leg elevation.  She was measured today for correct size and did get a pair.  We will have her return in the next few weeks to get a venous reflux study of the right leg since that was not done today.  I discussed that if she returns and has significant reflux that she is a candidate for laser ablation, that would change compression requirements per insurance requirements.  She expressed understanding.  -she is in agreement with this plan.   Doreatha Massed, [facility] [phone]  Clinic MD:   Karin Lieu on call MD

## 2023-05-03 NOTE — Telephone Encounter (Addendum)
     Refill is too soon due to just filled 30DS on 04/30/2023. Med is covered by the plan.

## 2023-05-06 ENCOUNTER — Other Ambulatory Visit: Payer: Self-pay

## 2023-05-06 DIAGNOSIS — R600 Localized edema: Secondary | ICD-10-CM

## 2023-05-12 ENCOUNTER — Other Ambulatory Visit: Payer: Self-pay | Admitting: Neurology

## 2023-05-12 MED ORDER — METHYLPHENIDATE HCL 20 MG PO TABS: ORAL_TABLET | ORAL | 0 refills | Status: DC

## 2023-05-12 NOTE — Telephone Encounter (Signed)
Last seen on 11/18/22 Follow up scheduled on 05/31/23 Last filled on 03/03/23 #60 tablets (30 day supply) Rx pending to be signed

## 2023-05-12 NOTE — Telephone Encounter (Signed)
Pt requesting refill of methylphenidate (RITALIN) 20 MG tablet sent to [facility] [phone]

## 2023-05-31 ENCOUNTER — Encounter: Payer: Self-pay | Admitting: Neurology

## 2023-05-31 ENCOUNTER — Ambulatory Visit: Payer: Medicare (Managed Care) | Admitting: Neurology

## 2023-06-03 ENCOUNTER — Ambulatory Visit: Payer: Medicare (Managed Care) | Admitting: Neurology

## 2023-06-03 ENCOUNTER — Encounter: Payer: Self-pay | Admitting: Neurology

## 2023-06-03 VITALS — BP 132/70 | HR 74 | Ht 66.0 in | Wt 258.5 lb

## 2023-06-03 DIAGNOSIS — F988 Other specified behavioral and emotional disorders with onset usually occurring in childhood and adolescence: Secondary | ICD-10-CM

## 2023-06-03 DIAGNOSIS — G35 Multiple sclerosis: Secondary | ICD-10-CM | POA: Diagnosis not present

## 2023-06-03 DIAGNOSIS — N3281 Overactive bladder: Secondary | ICD-10-CM

## 2023-06-03 DIAGNOSIS — F32A Depression, unspecified: Secondary | ICD-10-CM

## 2023-06-03 DIAGNOSIS — G2581 Restless legs syndrome: Secondary | ICD-10-CM

## 2023-06-03 DIAGNOSIS — R260 Ataxic gait: Secondary | ICD-10-CM | POA: Diagnosis not present

## 2023-06-03 MED ORDER — SOLIFENACIN SUCCINATE 10 MG PO TABS: ORAL_TABLET | ORAL | 3 refills | Status: DC

## 2023-06-03 MED ORDER — GABAPENTIN 600 MG PO TABS: ORAL_TABLET | ORAL | 3 refills | Status: DC

## 2023-06-03 NOTE — Progress Notes (Signed)
 u  [facility]  PATIENT: Sydney Pace DOB: 03/25/1956  REFERRING DOCTOR OR PCP:  Harlene Horton MD _________________________________   HISTORICAL  CHIEF COMPLAINT:  Chief Complaint  Patient presents with   Follow-up    Pt in room 11 alone. Here for MS follow up. Pt reports doing well, pt said last fall was christmas eve, no head injury. Pt reports having bad thoughts that something bad it going to happen. Pt reports she watches a lot of new and hears the bad news.     HISTORY OF PRESENT ILLNESS:  Sydney Pace is a 68 y.o. woman with multiple sclerosis.      Update 06/03/2023 She reports more knee pain and has been told she needs bilateral knee replacements.    She is hoping to lose some weight before the surgery.     She feels her MS is mostly stable though gait has slowly worsened    She has not had any MS exacerbations and has been off DMTs since late 2014   MRIs between 2007 and 2018 were stable.      Gait is off balanced.  She uses a Rollator style walker and feels more stable than using a cane. The left leg drags    She has dysesthetic pain, worse in R > L leg.    Gabapentin  only helps a little bit.     She feels her knee pain bothers her more and she feels has affected her gait more than MS.    She has urge incontinence that has gradually worsened.   Oxybutynin  did not help.  Solifenacin  helped  a bit only.  Myrbetriq  was better (had samples from urology) but is not covered by her insurance and is expensive.   No recent UTI's.    Vision is stable.     She has depression though is generally better than she was years ago with a major depression..     She did best on Abilify  and Zoloft . She stopped Abilify  when it ran out and feels it did help her but she would like to go a little longer before considering a restart.  Still on sertraline .   She used to do counseling with benefit but not well covered now.   She does not note anxiety or agitation.    Med's have  helped some.    She notes that she has poor focus and attention.   She feels it is and it is helped more by methylphenidate  compared to Adderall.    She tolerates it well.   She takes at least one every day and two some days.     She has insomnia.   The RLS is doing better.and she stopped gabapentin .   She often sleeps in her chair if she dozes off watching TV - better with methylphenidate   She still has insomnia  We discussed doing more walking and exercise.  She is poorly motivated to walk but sometimes walks with a friend .  She walks some in the neihtborhood and does some chair exercises.     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  MRI was performed in 1983 and was consistent with MS.     In the late 1990's, she was started on Copaxone but stopped after several weeks due to skin reactions. She also tried Avonex but had a rash and stopped. She started Gilenya in January 2014 but stopped after  7 or 8 months due to being more fatigue and having flulike symptoms. In May 2015 we had discussed Aubagio. She decided not to start.     MRI 02/28/2017 showed Multiple T2/FLAIR hyperintense foci in the hemispheres and thalamus in a pattern and configuration consistent with chronic demyelinating plaque associated with multiple sclerosis. None of the foci appears to be acute. When compared to the MRI dated 04/03/2006, there is no significant interval change.      Generalized cortical atrophy and corpus callosum atrophy  REVIEW OF SYSTEMS: Constitutional: No fevers, chills, sweats, or change in appetite.  She has fatigue and insomnia Eyes: see above.  No double vision, eye pain..  Some eye redness Ear, nose and throat: No hearing loss, ear pain, nasal congestion, sore throat Cardiovascular: No chest pain, palpitations Respiratory:  No shortness of breath at rest or with exertion.   No wheezes GastrointestinaI: No nausea, vomiting, diarrhea, abdominal  pain, fecal incontinence Genitourinary:  Mild urinary frequency.  No nocturia. Musculoskeletal:  No neck pain, back pain.  Some hip pain Integumentary: No rash, pruritus, skin lesions Neurological: as above Psychiatric: Mild depression at this time, rarely cries, less anxiety Endocrine: No palpitations, diaphoresis, change in appetite, change in weigh or increased thirst Hematologic/Lymphatic:  No anemia, purpura, petechiae. Allergic/Immunologic: No itchy/runny eyes, nasal congestion, recent allergic reactions, rashes  ALLERGIES: Allergies  Allergen Reactions   Shellfish Allergy Hives, Shortness Of Breath, Itching, Swelling and Rash    Pt had previously carried an epi-pen and had a history of severe reaction to shrimp with breathing problems and swelling of lips and tongue     HOME MEDICATIONS:  Current Outpatient Medications:    buPROPion  (WELLBUTRIN  XL) 300 MG 24 hr tablet, Take 1 tablet (300 mg total) by mouth daily., Disp: 90 tablet, Rfl: 1   cetirizine (ZYRTEC) 10 MG tablet, Take 10 mg by mouth every evening. , Disp: , Rfl:    Cholecalciferol (DIALYVITE VITAMIN D  5000 PO), Take 4,000 Units by mouth daily. , Disp: , Rfl:    fluticasone  (FLONASE ) 50 MCG/ACT nasal spray, USE 2 SPRAY(S) IN EACH NOSTRIL ONCE DAILY AS NEEDED FOR ALLERGIES OR RUNNY NOSE, Disp: 16 g, Rfl: 5   furosemide  (LASIX ) 20 MG tablet, Take 1 tablet (20 mg total) by mouth daily as needed., Disp: 90 tablet, Rfl: 2   lisinopril -hydrochlorothiazide  (ZESTORETIC ) 20-25 MG tablet, Take 1 tablet by mouth once daily, Disp: 90 tablet, Rfl: 1   methylphenidate  (RITALIN ) 20 MG tablet, 1 TABLET BY MOUTH IN THE MORNING AND 1 TABLET AT NOON., Disp: 60 tablet, Rfl: 0   montelukast  (SINGULAIR ) 10 MG tablet, Take 1 tablet (10 mg total) by mouth at bedtime., Disp: 30 tablet, Rfl: 3   Multiple Vitamin (MULTIVITAMIN) tablet, Take 1 tablet by mouth daily., Disp: , Rfl:    MYRBETRIQ  50 MG TB24 tablet, Take 50 mg by mouth daily., Disp: ,  Rfl:    potassium chloride  SA (KLOR-CON  M) 20 MEQ tablet, 1 tab po prn 2nd Furosemide  dose in a day, Disp: 90 tablet, Rfl: 1   Probiotic Product (PROBIOTIC DAILY PO), Take 1 tablet by mouth daily., Disp: , Rfl:    senna-docusate (SENOKOT-S) 8.6-50 MG tablet, Take 1 tablet by mouth at bedtime as needed for mild constipation., Disp: , Rfl:    sertraline  (ZOLOFT ) 100 MG tablet, TAKE 1 & 1/2 (ONE & ONE-HALF) TABLETS BY MOUTH ONCE DAILY, Disp: 135 tablet, Rfl: 0   solifenacin  (VESICARE ) 10 MG tablet, One po qd, Disp: 90 tablet,  Rfl: 3   ARIPiprazole  (ABILIFY ) 5 MG tablet, Take 1 tablet (5 mg total) by mouth daily., Disp: 30 tablet, Rfl: 3   AZO-CRANBERRY PO, Take 1 tablet by mouth daily as needed. (Patient not taking: Reported on 06/03/2023), Disp: , Rfl:    brimonidine (ALPHAGAN) 0.2 % ophthalmic solution, Place 1 drop into both eyes 2 (two) times daily. (Patient not taking: Reported on 06/03/2023), Disp: , Rfl:    Carboxymethylcellul-Glycerin (LUBRICATING EYE DROPS OP), Place 1 drop into both eyes daily as needed (dry eyes). (Patient not taking: Reported on 06/03/2023), Disp: , Rfl:    gabapentin  (NEURONTIN ) 600 MG tablet, Take one po at night, Disp: 90 tablet, Rfl: 3   mupirocin  ointment (BACTROBAN ) 2 %, Apply 1 Application topically 2 (two) times daily. (Patient not taking: Reported on 06/03/2023), Disp: 22 g, Rfl: 0  PAST MEDICAL HISTORY: Past Medical History:  Diagnosis Date   Anemia    Arthritis    on meds   Arthritis 07/06/2017   Constipation    occ   Dehydration 06/09/2016   Depression    on meds   Esophageal reflux 08/08/2012   on meds   GERD (gastroesophageal reflux disease)    on meds   Hypertension    on meds   Inverted nipple    right nipple has always been inverted - per pt   MS (multiple sclerosis) (HCC)    Neuromuscular disorder (HCC)    MS   Other and unspecified hyperlipidemia 08/08/2012   on meds   Seasonal allergies    Thyroid  disease     PAST SURGICAL HISTORY: Past  Surgical History:  Procedure Laterality Date   COLONOSCOPY  2020   VC-MAC-prep adeq-TA X 3;   HYSTEROSCOPY  02, 04, 2008   with D&C   ORIF WRIST FRACTURE Right 04/07/2018   Procedure: RIGHT WRIST OPEN REDUCTION INTERNAL FIXATION (ORIF);  Surgeon: Addie Cordella Hamilton, MD;  Location: [facility] OR;  Service: Orthopedics;  Laterality: Right;   POLYPECTOMY  2020   TA x 3;   TMJ ARTHROSCOPY     TONSILLECTOMY     UPPER GASTROINTESTINAL ENDOSCOPY      FAMILY HISTORY: Family History  Problem Relation Age of Onset   Heart disease Mother        pacemaker   Emphysema Mother    Hypertension Mother    Heart disease Father    Diabetes Father    Heart disease Sister        cad   Sleep apnea Brother    Colon cancer Neg Hx    Esophageal cancer Neg Hx    Rectal cancer Neg Hx    Stomach cancer Neg Hx    Colon polyps Neg Hx     SOCIAL HISTORY:  Social History   Socioeconomic History   Marital status: Single    Spouse name: Not on file   Number of children: Not on file   Years of education: Not on file   Highest education level: Not on file  Occupational History   Not on file  Tobacco Use   Smoking status: Never   Smokeless tobacco: Never  Vaping Use   Vaping status: Never Used  Substance and Sexual Activity   Alcohol  use: Not Currently    Alcohol /week: 0.0 - 1.0 standard drinks of alcohol     Comment: occassional   Drug use: No   Sexual activity: Never    Comment: 1st intercourse 61 yo-5 partners  Other Topics Concern   Not  on file  Social History Narrative   Not on file   Social Drivers of Health   Financial Resource Strain: Low Risk  (11/10/2021)   Overall Financial Resource Strain (CARDIA)    Difficulty of Paying Living Expenses: Not hard at all  Food Insecurity: Food Insecurity Present (12/10/2022)   Hunger Vital Sign    Worried About Running Out of Food in the Last Year: Sometimes true    Ran Out of Food in the Last Year: Sometimes true  Transportation Needs: No  Transportation Needs (12/10/2022)   PRAPARE - Administrator, Civil Service (Medical): No    Lack of Transportation (Non-Medical): No  Physical Activity: Inactive (12/10/2022)   Exercise Vital Sign    Days of Exercise per Week: 0 days    Minutes of Exercise per Session: 0 min  Stress: No Stress Concern Present (11/10/2021)   [employer] of Occupational Health - Occupational Stress Questionnaire    Feeling of Stress : Not at all  Social Connections: Socially Isolated (11/10/2021)   Social Connection and Isolation Panel [NHANES]    Frequency of Communication with Friends and Family: Once a week    Frequency of Social Gatherings with Friends and Family: Never    Attends Religious Services: Never    Database Administrator or Organizations: No    Attends Banker Meetings: Never    Marital Status: Never married  Intimate Partner Violence: Not At Risk (11/10/2021)   Humiliation, Afraid, Rape, and Kick questionnaire    Fear of Current or Ex-Partner: No    Emotionally Abused: No    Physically Abused: No    Sexually Abused: No     PHYSICAL EXAM  Vitals:   06/03/23 1334  BP: 132/70  Pulse: 74  Weight: 258 lb 8 oz (117.3 kg)  Height: 5' 6 (1.676 m)    Body mass index is 41.72 kg/m.   General: The patient is well-developed and well-nourished and in no acute distress  Neurologic Exam  Mental status: The patient is alert and oriented x 3 at the time of the examination. The patient has apparent normal recent and remote memory, with an apparently normal attention span and concentration ability.   Speech is normal.  Cranial nerves: Extraocular movements are full.  She has reduced color vision on the left.  Facial strength was normal.   No dysarthria is noted.   Hearing seems normal.   Motor:  Muscle bulk is normal.  Strength is 5/5 except for 4/5 strength in the ulnar innervated hand muscles and 4+/5 in the EHL muscles.  Sensory: Symmetric sensation in  proximal arms to touch, temp and vibration.  She has reduced left leg temperature sensation.   Coordination: Cerebellar testing reveals good finger-nose-finger bilaterally.  Gait and station: She needs to use her arms to stand up but once up can stand without support.   She has hip/leg pain.  She has a reduced stride and wide gait.  She has difficulty walking without her walker.   Unable to do a tandem walk Romberg is negative.  Reflexes: Deep tendon reflexes are symmetric and normal in arms, 3+ at knees with mild spread and 2 at the ankles.Sydney Pace        DIAGNOSTIC DATA (LABS, IMAGING, TESTING) - I reviewed patient records, labs, notes, testing and imaging myself where available.  Lab Results  Component Value Date   WBC 9.4 03/04/2023   HGB 10.4 (L) 03/04/2023   HCT 32.4 (L) 03/04/2023  MCV 88.7 03/04/2023   PLT 172.0 03/04/2023      Component Value Date/Time   NA 139 03/04/2023 1420   K 4.0 03/04/2023 1420   CL 103 03/04/2023 1420   CO2 27 03/04/2023 1420   GLUCOSE 91 03/04/2023 1420   BUN 22 03/04/2023 1420   CREATININE 1.22 (H) 03/04/2023 1420   CREATININE 0.91 04/22/2016 1730   CALCIUM 10.1 03/04/2023 1420   PROT 7.2 03/04/2023 1420   ALBUMIN 3.7 03/04/2023 1420   AST 16 03/04/2023 1420   ALT 11 03/04/2023 1420   ALKPHOS 90 03/04/2023 1420   BILITOT 0.6 03/04/2023 1420   GFRNONAA >60 02/03/2023 0405   GFRAA >60 04/07/2018 1330   Lab Results  Component Value Date   CHOL 154 12/31/2022   HDL 55.10 12/31/2022   LDLCALC 90 12/31/2022   TRIG 46.0 12/31/2022   CHOLHDL 3 12/31/2022   Lab Results  Component Value Date   HGBA1C 5.8 12/31/2022   Lab Results  Component Value Date   VITAMINB12 513 07/25/2019   Lab Results  Component Value Date   TSH 0.89 12/31/2022       ASSESSMENT AND PLAN  Multiple sclerosis (HCC)  Ataxic gait  Attention deficit disorder (ADD) in adult  Overactive bladder  Depression, unspecified depression type  Restless leg  syndrome   1.   She has inactive mild SPMS.   She will remain off of a disease modifying therapy.  She has had no exacerbations for many years.  She prefers not to do another MRI (previous MRi 2018 was stable)  2.   Continue Ritalin  for attention, wakefulness and fatigue 3.   Continue gabapentin  for restless leg syndrome/insomnia.  Continue Zoloft  for mood.   Consider restating Abilify  if it worsens 4.   Continue Vit D supplements and stay active. 5.   Stay active.   Try to walk daily out of the house in good weather 6.  Solifenacin  for bladder.  Consider re-adding Myrbetriq  7.  She will return to see us  in 6 months or sooner if she has new or worsening neurologic symptoms.  This visit is part of a comprehensive longitudinal care medical relationship regarding the patients primary diagnosis of MS and related concerns.   Jeromiah Ohalloran A. Vear, MD, PhD 06/03/2023, 2:01 PM Certified in Neurology, Clinical Neurophysiology, Sleep Medicine, Pain Medicine and Neuroimaging  [facility] [address], [address] [place], [place] [address] [phone]

## 2023-06-15 ENCOUNTER — Ambulatory Visit (HOSPITAL_COMMUNITY)
Admission: RE | Admit: 2023-06-15 | Discharge: 2023-06-15 | Disposition: A | Discharge status: Home / Self Care | Payer: Medicare (Managed Care) | Source: Ambulatory Visit | Attending: Surgery | Admitting: Surgery

## 2023-06-15 ENCOUNTER — Encounter: Payer: Self-pay | Admitting: Physician Assistant

## 2023-06-15 ENCOUNTER — Ambulatory Visit: Payer: Medicare (Managed Care) | Admitting: Physician Assistant

## 2023-06-15 VITALS — BP 133/82 | HR 86 | Temp 98.2°F | Resp 20 | Ht 66.0 in | Wt 255.9 lb

## 2023-06-15 DIAGNOSIS — R600 Localized edema: Secondary | ICD-10-CM | POA: Diagnosis not present

## 2023-06-15 DIAGNOSIS — I872 Venous insufficiency (chronic) (peripheral): Secondary | ICD-10-CM

## 2023-06-15 NOTE — Progress Notes (Signed)
 Follow up visit    CC:  F/u for surgery  HPI:  This is a 68 y.o. female who was seen 05/03/23 in our office with a history of bacteremia with Staph epidermidis and Achromobacter xylosoxidans and extensive cellulitis of the lower legs. ID was consulted and she was placed on appropriate IV abx. She denies any claudication or rest pain. She states she does have swelling and it does improve after having elevated her legs.  She has not history of DVT or varicose veins.     She did have ABI showing normal arterial flow with easily palpable DP pulses bilaterally.  She was placed in 15-20mmHg knee high compression socks and leg elevation.    Her PMH significant for morbid obesity, multiple sclerosis, HTN, GERD, hypothyroidism, depression, constipation.   The pt is not on a statin for cholesterol management.    The pt is not on an aspirin.    Other AC:  none The pt is not on medication for hypertension.  The pt is not on medication for diabetes. Tobacco hx:  never   Allergies  Allergen Reactions   Shellfish Allergy Hives, Shortness Of Breath, Itching, Swelling and Rash    Pt had previously carried an epi-pen and had a history of severe reaction to shrimp with breathing problems and swelling of lips and tongue     Current Outpatient Medications  Medication Sig Dispense Refill   ARIPiprazole  (ABILIFY ) 5 MG tablet Take 1 tablet (5 mg total) by mouth daily. 30 tablet 3   AZO-CRANBERRY PO Take 1 tablet by mouth daily as needed. (Patient not taking: Reported on 06/03/2023)     brimonidine (ALPHAGAN) 0.2 % ophthalmic solution Place 1 drop into both eyes 2 (two) times daily. (Patient not taking: Reported on 06/03/2023)     buPROPion  (WELLBUTRIN  XL) 300 MG 24 hr tablet Take 1 tablet (300 mg total) by mouth daily. 90 tablet 1   Carboxymethylcellul-Glycerin (LUBRICATING EYE DROPS OP) Place 1 drop into both eyes daily as needed (dry eyes). (Patient not taking: Reported on 06/03/2023)     cetirizine (ZYRTEC) 10  MG tablet Take 10 mg by mouth every evening.      Cholecalciferol (DIALYVITE VITAMIN D  5000 PO) Take 4,000 Units by mouth daily.      fluticasone  (FLONASE ) 50 MCG/ACT nasal spray USE 2 SPRAY(S) IN EACH NOSTRIL ONCE DAILY AS NEEDED FOR ALLERGIES OR RUNNY NOSE 16 g 5   furosemide  (LASIX ) 20 MG tablet Take 1 tablet (20 mg total) by mouth daily as needed. 90 tablet 2   gabapentin  (NEURONTIN ) 600 MG tablet Take one po at night 90 tablet 3   lisinopril -hydrochlorothiazide  (ZESTORETIC ) 20-25 MG tablet Take 1 tablet by mouth once daily 90 tablet 1   methylphenidate  (RITALIN ) 20 MG tablet 1 TABLET BY MOUTH IN THE MORNING AND 1 TABLET AT NOON. 60 tablet 0   montelukast  (SINGULAIR ) 10 MG tablet Take 1 tablet (10 mg total) by mouth at bedtime. 30 tablet 3   Multiple Vitamin (MULTIVITAMIN) tablet Take 1 tablet by mouth daily.     mupirocin  ointment (BACTROBAN ) 2 % Apply 1 Application topically 2 (two) times daily. (Patient not taking: Reported on 06/03/2023) 22 g 0   MYRBETRIQ  50 MG TB24 tablet Take 50 mg by mouth daily.     potassium chloride  SA (KLOR-CON  M) 20 MEQ tablet 1 tab po prn 2nd Furosemide  dose in a day 90 tablet 1   Probiotic Product (PROBIOTIC DAILY PO) Take 1 tablet by mouth daily.  senna-docusate (SENOKOT-S) 8.6-50 MG tablet Take 1 tablet by mouth at bedtime as needed for mild constipation.     sertraline  (ZOLOFT ) 100 MG tablet TAKE 1 & 1/2 (ONE & ONE-HALF) TABLETS BY MOUTH ONCE DAILY 135 tablet 0   solifenacin  (VESICARE ) 10 MG tablet One po qd 90 tablet 3   No current facility-administered medications for this visit.     ROS:  See HPI  Physical Exam:     Venous Reflux Times  +--------------+---------+------+-----------+------------+--------+  RIGHT        Reflux NoRefluxReflux TimeDiameter cmsComments                          Yes                                   +--------------+---------+------+-----------+------------+--------+  CFV                    yes   >1  second                       +--------------+---------+------+-----------+------------+--------+  FV mid        no                                              +--------------+---------+------+-----------+------------+--------+  Popliteal    no                                              +--------------+---------+------+-----------+------------+--------+  GSV at SFJ              yes    >500 ms      .866              +--------------+---------+------+-----------+------------+--------+  GSV prox thighno                            .472              +--------------+---------+------+-----------+------------+--------+  GSV mid thigh no                            .518              +--------------+---------+------+-----------+------------+--------+  GSV dist thighno                            .512              +--------------+---------+------+-----------+------------+--------+  GSV at knee   no                            .506              +--------------+---------+------+-----------+------------+--------+  GSV prox calf no                            .376              +--------------+---------+------+-----------+------------+--------+  SSV Pop Fossa no                            .  301              +--------------+---------+------+-----------+------------+--------+  SSV prox calf no                            .308              +--------------+---------+------+-----------+------------+--------+    Summary:  Right:  - No evidence of deep vein thrombosis seen in the right lower extremity,  from the common femoral through the popliteal veins.  - No evidence of superficial venous thrombosis in the right lower  extremity.  - Venous reflux is noted in the right common femoral vein.  - Venous reflux is noted in the right sapheno-femoral junction.    Extremities:  without ischemic changes, without Gangrene , without cellulitis;  nearly healed wound right lower leg as pictured  Palpable DP pulses  Permanent skin pigmentation changes wit scars B LE, no open drainage wounds Lungs non labored breathing    Assessment/Plan:  This is a 68 y.o. female who is for follow up venous duplex.  The skin appearance indicated possible venous reflux changes with hyperpigmentation and scars over the lower legs.  The reflux study shows 2 areas of reflux CFV and SFJ without further reflux.  She will continue to manage her symptoms with compression , elevation and increased activity as she tolerates.  She has palpable pedal pulses.  F/U as needed in the future.    Maurilio Deland Collet PA-C [facility] [phone]   Clinic MD:  Gretta

## 2023-07-19 ENCOUNTER — Other Ambulatory Visit: Payer: Self-pay | Admitting: Neurology

## 2023-07-19 MED ORDER — METHYLPHENIDATE HCL 20 MG PO TABS: ORAL_TABLET | ORAL | 0 refills | Status: DC

## 2023-07-19 NOTE — Telephone Encounter (Signed)
 Last seen 06/03/23 and next f/u 12/16/23. Last refilled 05/13/23 #60.

## 2023-07-19 NOTE — Telephone Encounter (Signed)
Pt is requesting a refill for methylphenidate (RITALIN) 20 MG tablet.  Pharmacy:  [facility] [phone]

## 2023-07-29 ENCOUNTER — Other Ambulatory Visit: Payer: Self-pay | Admitting: Family Medicine

## 2023-07-29 DIAGNOSIS — F332 Major depressive disorder, recurrent severe without psychotic features: Secondary | ICD-10-CM

## 2023-08-12 ENCOUNTER — Other Ambulatory Visit: Payer: Self-pay | Admitting: Family Medicine

## 2023-08-12 ENCOUNTER — Other Ambulatory Visit: Payer: Self-pay | Admitting: Neurology

## 2023-08-12 NOTE — Telephone Encounter (Signed)
 Last note stated: Myrbetriq was better (had samples from urology) but is not covered by her insurance and is expensive.   Refusing refill

## 2023-08-17 ENCOUNTER — Other Ambulatory Visit: Payer: Self-pay | Admitting: Neurology

## 2023-09-20 ENCOUNTER — Other Ambulatory Visit: Payer: Self-pay | Admitting: Neurology

## 2023-09-20 MED ORDER — METHYLPHENIDATE HCL 20 MG PO TABS: ORAL_TABLET | ORAL | 0 refills | Status: DC

## 2023-09-20 NOTE — Telephone Encounter (Signed)
 Last seen 06/03/23 and next f/u 12/16/23. Last refilled 07/19/23 #60.

## 2023-09-20 NOTE — Telephone Encounter (Signed)
 Pt called requesting a refill on her methylphenidate  (RITALIN ) 20 MG tablet and needing it sent to the [facility] on [address].

## 2023-10-09 ENCOUNTER — Other Ambulatory Visit: Payer: Self-pay | Admitting: Family Medicine

## 2023-10-09 DIAGNOSIS — F332 Major depressive disorder, recurrent severe without psychotic features: Secondary | ICD-10-CM

## 2023-10-12 ENCOUNTER — Other Ambulatory Visit: Payer: Self-pay | Admitting: Neurology

## 2023-10-13 NOTE — Telephone Encounter (Signed)
 Last seen on 06/03/23 Follow up scheduled on 11/30/23

## 2023-11-06 ENCOUNTER — Other Ambulatory Visit: Payer: Self-pay | Admitting: Family Medicine

## 2023-11-06 DIAGNOSIS — F332 Major depressive disorder, recurrent severe without psychotic features: Secondary | ICD-10-CM

## 2023-11-25 ENCOUNTER — Other Ambulatory Visit: Payer: Self-pay | Admitting: Neurology

## 2023-11-25 MED ORDER — METHYLPHENIDATE HCL 20 MG PO TABS: ORAL_TABLET | ORAL | 0 refills | Status: DC

## 2023-11-25 NOTE — Telephone Encounter (Signed)
 Patient request refill for methylphenidate  (RITALIN ) 20 MG tablet send to  [id number]

## 2023-11-25 NOTE — Telephone Encounter (Signed)
 Last seen 06/03/23 and next f/u 12/16/23. Last refilled 09/20/23 #60.

## 2023-12-10 ENCOUNTER — Ambulatory Visit: Payer: Medicare (Managed Care)

## 2023-12-10 ENCOUNTER — Telehealth: Payer: Self-pay | Admitting: *Deleted

## 2023-12-10 NOTE — Telephone Encounter (Signed)
 Pt was scheduled for in person AWV with me today at 10:20.  Pt did not show.  Left message for pt to call the office and reschedule with wellness visit 1 schedule at [facility].

## 2023-12-16 ENCOUNTER — Ambulatory Visit: Payer: Medicare (Managed Care) | Admitting: Neurology

## 2023-12-16 ENCOUNTER — Encounter: Payer: Self-pay | Admitting: Neurology

## 2023-12-21 ENCOUNTER — Encounter: Payer: Self-pay | Admitting: Pharmacist

## 2023-12-21 NOTE — Progress Notes (Signed)
 Pharmacy Quality Measure Review  This patient is appearing on a report for being at risk of failing the adherence measure for hypertension (ACEi/ARB) medications this calendar year.   Medication: lisinopril  HCTZ Last fill date: 07/21/2023 for 90 day supply  Last Rx sent in for lisinopril  hydrochlorothiazide  was 08/2022  Spoke with patient today - she endorses that she has enough lisinopril  hydrochlorothiazide  to last until she has an office visit 12/28/2023. She also endorses that she has been taking lisinopril  hydrochlorothiazide  every day.   BP Readings from Last 3 Encounters:  06/15/23 133/82  06/03/23 132/70  05/03/23 (!) 152/79    Offered to send in updated prescription today but pateint state she will wait until her appointment next week to get updated prescriptions for all her medications.   Madelin Ray, PharmD Clinical Pharmacist [facility]  Population Health [phone]

## 2023-12-27 ENCOUNTER — Ambulatory Visit: Payer: Medicare (Managed Care) | Admitting: Family Medicine

## 2023-12-28 ENCOUNTER — Ambulatory Visit (INDEPENDENT_AMBULATORY_CARE_PROVIDER_SITE_OTHER): Payer: Medicare (Managed Care) | Admitting: Student

## 2023-12-28 ENCOUNTER — Encounter: Payer: Self-pay | Admitting: Student

## 2023-12-28 VITALS — BP 122/80 | HR 69 | Temp 98.3°F | Resp 12 | Ht 66.0 in | Wt 248.8 lb

## 2023-12-28 DIAGNOSIS — I10 Essential (primary) hypertension: Secondary | ICD-10-CM | POA: Diagnosis not present

## 2023-12-28 DIAGNOSIS — R739 Hyperglycemia, unspecified: Secondary | ICD-10-CM

## 2023-12-28 DIAGNOSIS — D649 Anemia, unspecified: Secondary | ICD-10-CM | POA: Diagnosis not present

## 2023-12-28 DIAGNOSIS — M8580 Other specified disorders of bone density and structure, unspecified site: Secondary | ICD-10-CM | POA: Diagnosis not present

## 2023-12-28 DIAGNOSIS — R32 Unspecified urinary incontinence: Secondary | ICD-10-CM

## 2023-12-28 DIAGNOSIS — T7840XD Allergy, unspecified, subsequent encounter: Secondary | ICD-10-CM

## 2023-12-28 DIAGNOSIS — Z0000 Encounter for general adult medical examination without abnormal findings: Secondary | ICD-10-CM

## 2023-12-28 DIAGNOSIS — F419 Anxiety disorder, unspecified: Secondary | ICD-10-CM | POA: Diagnosis not present

## 2023-12-28 DIAGNOSIS — E559 Vitamin D deficiency, unspecified: Secondary | ICD-10-CM

## 2023-12-28 DIAGNOSIS — E669 Obesity, unspecified: Secondary | ICD-10-CM | POA: Diagnosis not present

## 2023-12-28 DIAGNOSIS — F32A Depression, unspecified: Secondary | ICD-10-CM

## 2023-12-28 DIAGNOSIS — N3281 Overactive bladder: Secondary | ICD-10-CM

## 2023-12-28 DIAGNOSIS — G35 Multiple sclerosis: Secondary | ICD-10-CM

## 2023-12-28 MED ORDER — LISINOPRIL-HYDROCHLOROTHIAZIDE 20-25 MG PO TABS: 1.0000 | ORAL_TABLET | Freq: Every day | ORAL | 1 refills | Status: DC

## 2023-12-28 NOTE — Assessment & Plan Note (Signed)
 HCM Last Dexa- 07/2019- Osteopenia, Repeat Dexa Pap 2019 with Dr Rockney, GYN Colonoscopy- Last 2023, Repeat 2028 Immunizations-Tdap and COVID due-patient to receive at pharmacy Discussed need for ACP documents.   Labs ordered and reviewed.

## 2023-12-28 NOTE — Assessment & Plan Note (Signed)
 hgba1c acceptable, minimize simple carbs. Increase exercise as tolerated. Continue current meds

## 2023-12-28 NOTE — Assessment & Plan Note (Signed)
 Follows with neurology-Dr. Suanne at [facility].  Continue to follow-up.  No recent flares.

## 2023-12-28 NOTE — Assessment & Plan Note (Addendum)
Refer to urology for further evaluation. 

## 2023-12-28 NOTE — Assessment & Plan Note (Signed)
 Well controlled, no changes to meds. Encouraged heart healthy diet such as the DASH diet and exercise as tolerated.

## 2023-12-28 NOTE — Patient Instructions (Addendum)
 Schedule Annual Wellness Visit   Get TDAP and Covid Vaccines at pharmacy   [facility] Providers: [facility] located at [address], [place], [place].  [phone].    [facility])- [address], [place], [place] [address] - [phone]  [facility] located at [address], [place], [place]. [phone].  [facility] located at [address] [place], [place].  [phone].  [facility] located at [address] [place], [place], [place].  [phone].  [facility] located at [address], [place], [place].  [phone].    [facility] located at [address], [place], [place].  [phone].    [facility] - 4 local practices located at: [address], [place], [place].  [phone]. [address] [place], [place], [place].  [phone]. [address], [place], [place].  [phone]. [address], [place], [place]. [phone].  [facility] located at [address], [place], [place]. [phone].    [facility] located at [address], [place], [place] [address]- [phone]   [facility] located at [address], [place], [place] [address] [phone]   [facility] located at [address] [place], [place], [place] [address] = [phone]   [facility] located at [address], [place], [place] [address] = [phone]   [facility] [address] [place], [place] [address] - [phone]  [facility] [address] [place], [place], [place] [address]- [phone]   [facility] [address] [place], [place] [address]- [phone]  [facility] [address] [place], [place] -[phone]  [facility]- [address], [place], [place] [address]- [phone]  [facility]- [address] [place], [place], [place]- [phone] (several Locations)  [facility] - [address] [place], [place] [address]- [phone]   [facility]- [address] [place], [place], [place] [phone]  [facility]- [address], [place], [place] [address] [phone]    [facility]- [address], [place], [place] [address]- [phone]  [facility]- [address] [place], [place] [address]- [phone]  [facility] [address], [place], [place] [address]- [phone]. [facility], [facility] [place] [phone] [facility] [address] [place] [phone] [facility]- [address] [place], [place], [place] - [phone]  [facility]- [address] [place], [place], [place] [address]- [phone]  [facility]- [address], [place] -  [phone]  [facility] - [address], [place]- [phone]

## 2023-12-28 NOTE — Assessment & Plan Note (Signed)
 Encouraged to get adequate exercise, calcium and vitamin d  intake.  Repeat DEXA scan.

## 2023-12-28 NOTE — Assessment & Plan Note (Signed)
 Has been referred to Urology. Still having issues nighttime incontinence. Consider  PT referral for pelvic floor strengthening.

## 2023-12-28 NOTE — Assessment & Plan Note (Signed)
 Stable on current medications

## 2023-12-28 NOTE — Progress Notes (Signed)
 Subjective:     Patient ID: Sydney Pace, female    DOB: Oct 18, 1955, 68 y.o.   MRN: [id number]  Chief Complaint  Patient presents with   follow up    HPI Patient presents for follow-up of chronic conditions.  PMHx of thyroid  disease, GERD, MS, arthritis.  Patient lives alone, she is not married.   Patient Care Team: Domenica Harlene LABOR, MD as PCP - General (Family Medicine) Debrah Lamar BIRCH, MD (Inactive) as Consulting Physician (Gastroenterology) Vear, Charlie LABOR, MD (Neurology)    Followed Ab:Neyuyjofnonhb-Wzlmnonhb-Im. Vear  HTN Lisinopril -hydrochlorothiazide  (Zestoretic ) 20-25 mg daily  Furosemide  (Lasix ) 20 mg as needed BP at home: Not taking  MS-followed by neurology-Dr. Charlie [facility] Methylphenidate  (Ritalin ) 20 mg twice daily  OAB/ Urine Incontinence Myrbetriq  50 mg daily, Solifenacin  (Vesicare ) 10 mg daily Reports episodes incontinence at night, she has not been able to take Myrbetriq  consistently due to cost.  Incontinence episodes affecting daily life.  Depression Wellbutrin  300 mg daily, sertraline  (Zoloft ) 150 mg daily. SI/HI: No thoughts of harming self or others. Denies self-medication with alcohol , prescription drugs or illicit drugs. -Pt is not following with a counselor/psychologist.  Allergy-Zyrtec 10 mg at bedtime, Flonase   Patient denies fever, chills, SOB, CP, palpitations, dyspnea, edema, HA, vision changes, N/V/D, abdominal pain, rash, weight changes, and recent illness or hospitalizations.    History of Present Illness              Health Maintenance Due  Topic Date Due   Medicare Annual Wellness (AWV)  12/10/2023    Past Medical History:  Diagnosis Date   Anemia    Arthritis    on meds   Arthritis 07/06/2017   Constipation    occ   Dehydration 06/09/2016   Depression    on meds   Esophageal reflux 08/08/2012   on meds   GERD (gastroesophageal reflux disease)    on meds   Hypertension    on  meds   Inverted nipple    right nipple has always been inverted - per pt   MS (multiple sclerosis) (HCC)    Neuromuscular disorder (HCC)    MS   Other and unspecified hyperlipidemia 08/08/2012   on meds   Seasonal allergies    Thyroid  disease     Past Surgical History:  Procedure Laterality Date   COLONOSCOPY  2020   VC-MAC-prep adeq-TA X 3;   HYSTEROSCOPY  02, 04, 2008   with D&C   ORIF WRIST FRACTURE Right 04/07/2018   Procedure: RIGHT WRIST OPEN REDUCTION INTERNAL FIXATION (ORIF);  Surgeon: Addie Cordella Hamilton, MD;  Location[facility] OR;  Service: Orthopedics;  Laterality: Right;   POLYPECTOMY  2020   TA x 3;   TMJ ARTHROSCOPY     TONSILLECTOMY     UPPER GASTROINTESTINAL ENDOSCOPY      Family History  Problem Relation Age of Onset   Heart disease Mother        pacemaker   Emphysema Mother    Hypertension Mother    Heart disease Father    Diabetes Father    Heart disease Sister        cad   Sleep apnea Brother    Colon cancer Neg Hx    Esophageal cancer Neg Hx    Rectal cancer Neg Hx    Stomach cancer Neg Hx    Colon polyps Neg Hx     Social History   Socioeconomic History   Marital status: Single  Spouse name: Not on file   Number of children: Not on file   Years of education: Not on file   Highest education level: Not on file  Occupational History   Not on file  Tobacco Use   Smoking status: Never   Smokeless tobacco: Never  Vaping Use   Vaping status: Never Used  Substance and Sexual Activity   Alcohol  use: Not Currently    Alcohol /week: 0.0 - 1.0 standard drinks of alcohol     Comment: occassional   Drug use: No   Sexual activity: Never    Comment: 1st intercourse 31 yo-5 partners  Other Topics Concern   Not on file  Social History Narrative   Not on file   Social Drivers of Health   Financial Resource Strain: Low Risk  (11/10/2021)   Overall Financial Resource Strain (CARDIA)    Difficulty of Paying Living Expenses: Not hard at all  Food  Insecurity: Food Insecurity Present (12/10/2022)   Hunger Vital Sign    Worried About Running Out of Food in the Last Year: Sometimes true    Ran Out of Food in the Last Year: Sometimes true  Transportation Needs: No Transportation Needs (12/10/2022)   PRAPARE - Administrator, Civil Service (Medical): No    Lack of Transportation (Non-Medical): No  Physical Activity: Inactive (12/10/2022)   Exercise Vital Sign    Days of Exercise per Week: 0 days    Minutes of Exercise per Session: 0 min  Stress: No Stress Concern Present (11/10/2021)   [employer] of Occupational Health - Occupational Stress Questionnaire    Feeling of Stress : Not at all  Social Connections: Socially Isolated (11/10/2021)   Social Connection and Isolation Panel    Frequency of Communication with Friends and Family: Once a week    Frequency of Social Gatherings with Friends and Family: Never    Attends Religious Services: Never    Database administrator or Organizations: No    Attends Banker Meetings: Never    Marital Status: Never married  Intimate Partner Violence: Not At Risk (11/10/2021)   Humiliation, Afraid, Rape, and Kick questionnaire    Fear of Current or Ex-Partner: No    Emotionally Abused: No    Physically Abused: No    Sexually Abused: No    Outpatient Medications Prior to Visit  Medication Sig Dispense Refill   ARIPiprazole  (ABILIFY ) 5 MG tablet Take 1 tablet (5 mg total) by mouth daily. 30 tablet 3   AZO-CRANBERRY PO Take 1 tablet by mouth daily as needed.     brimonidine (ALPHAGAN) 0.2 % ophthalmic solution Place 1 drop into both eyes 2 (two) times daily.     buPROPion  (WELLBUTRIN  XL) 300 MG 24 hr tablet Take 1 tablet (300 mg total) by mouth daily. 90 tablet 1   Carboxymethylcellul-Glycerin (LUBRICATING EYE DROPS OP) Place 1 drop into both eyes daily as needed (dry eyes).     cetirizine (ZYRTEC) 10 MG tablet Take 10 mg by mouth every evening.      Cholecalciferol  (DIALYVITE VITAMIN D  5000 PO) Take 4,000 Units by mouth daily.      fluticasone  (FLONASE ) 50 MCG/ACT nasal spray Place 2 sprays into both nostrils daily as needed for allergies or rhinitis. 16 g 5   furosemide  (LASIX ) 20 MG tablet Take 1 tablet (20 mg total) by mouth daily as needed. 90 tablet 2   methylphenidate  (RITALIN ) 20 MG tablet 1 TABLET BY MOUTH IN THE MORNING  AND 1 TABLET AT NOON. 60 tablet 0   montelukast  (SINGULAIR ) 10 MG tablet Take 1 tablet (10 mg total) by mouth at bedtime. 30 tablet 3   Multiple Vitamin (MULTIVITAMIN) tablet Take 1 tablet by mouth daily.     MYRBETRIQ  50 MG TB24 tablet Take 1 tablet by mouth once daily 30 tablet 2   potassium chloride  SA (KLOR-CON  M) 20 MEQ tablet 1 tab po prn 2nd Furosemide  dose in a day 90 tablet 1   Probiotic Product (PROBIOTIC DAILY PO) Take 1 tablet by mouth daily.     sertraline  (ZOLOFT ) 100 MG tablet Take 1.5 tablets (150 mg total) by mouth daily. 135 tablet 0   solifenacin  (VESICARE ) 10 MG tablet One po qd 90 tablet 3   gabapentin  (NEURONTIN ) 600 MG tablet Take one po at night 90 tablet 3   lisinopril -hydrochlorothiazide  (ZESTORETIC ) 20-25 MG tablet Take 1 tablet by mouth once daily 90 tablet 1   mupirocin  ointment (BACTROBAN ) 2 % Apply 1 Application topically 2 (two) times daily. 22 g 0   senna-docusate (SENOKOT-S) 8.6-50 MG tablet Take 1 tablet by mouth at bedtime as needed for mild constipation.     No facility-administered medications prior to visit.    Allergies  Allergen Reactions   Shellfish Allergy Hives, Shortness Of Breath, Itching, Swelling and Rash    Pt had previously carried an epi-pen and had a history of severe reaction to shrimp with breathing problems and swelling of lips and tongue     ROS    See HPI Objective:    Physical Exam  General: No acute distress. Awake and conversant. +obese Eyes: Normal conjunctiva, anicteric. Round symmetric pupils.  ENT: Hearing grossly intact. No nasal discharge.  Neck: Neck  is supple. No masses or thyromegaly.  Respiratory: CTAB. Respirations are non-labored. No wheezing.  Skin: Warm. No rashes or ulcers.  Psych: Alert and oriented. Cooperative, Appropriate mood and affect, Normal judgment.  CV: RRR. No murmur. No lower extremity edema.  MSK: Ambulates with front wheel walker.  No clubbing or cyanosis. +dark, hyperpigmented skin b/l LEs Neuro:  CN II-XII grossly normal.      Wt Readings from Last 3 Encounters:  12/28/23 248 lb 12.8 oz (112.9 kg)  06/15/23 255 lb 14.4 oz (116.1 kg)  06/03/23 258 lb 8 oz (117.3 kg)       Assessment & Plan:   Problem List Items Addressed This Visit     Allergy   Stable on current medications.      Essential hypertension - Primary   Well controlled, no changes to meds. Encouraged heart healthy diet such as the DASH diet and exercise as tolerated.        Relevant Medications   lisinopril -hydrochlorothiazide  (ZESTORETIC ) 20-25 MG tablet   Other Relevant Orders   CBC with Differential/Platelet   TSH   Hyperglycemia   hgba1c acceptable, minimize simple carbs. Increase exercise as tolerated. Continue current meds       Relevant Orders   Comprehensive metabolic panel with GFR   Hemoglobin A1c   Multiple sclerosis (HCC)   Follow[facility].  Continue to follow-up.  No recent flares.      Obesity   Encouraged DASH or MIND diet, decrease po intake and increase exercise as tolerated. Needs 7-8 hours of sleep nightly. Avoid trans fats, eat small, frequent meals every 4-5 hours with lean proteins, complex carbs and healthy fats. Minimize simple carbs, high fat foods and processed foods  Relevant Orders   Lipid panel   Osteopenia   Encouraged to get adequate exercise, calcium and vitamin d  intake.  Repeat DEXA scan.      Overactive bladder   Refer to urology for further evaluation.      Preventative health care   HCM Last Dexa- 07/2019- Osteopenia, Repeat  Dexa Pap 2019 with Dr Rockney, GYN Colonoscopy- Last 2023, Repeat 2028 Immunizations-Tdap and COVID due-patient to receive at pharmacy Discussed need for ACP documents.   Labs ordered and reviewed.      Urinary incontinence   Has been referred to Urology. Still having issues nighttime incontinence. Consider  PT referral for pelvic floor strengthening.       Relevant Orders   Ambulatory referral to Urology   Vitamin D  deficiency   Relevant Orders   VITAMIN D  25 Hydroxy (Vit-D Deficiency, Fractures)   DG Bone Density   Other Visit Diagnoses       Anemia, unspecified type       Relevant Orders   B12 and Folate Panel     Anxiety and depression       Relevant Orders   Ambulatory referral to Behavioral Health      Depression/ Anxiety Patient reports previously receiving care through [facility] and was prescribed Abilify , which she has since discontinued after stopping follow-up. She noted feeling improvement in mood while on Abilify . Currently, she reports stability on her current medications: Zoloft  100?mg daily and Wellbutrin  300?mg daily, but feels she still struggles with mood and secluding self. Counseling was encouraged, and a list of local behavioral health counselors was provided. A referral to [facility] has also been submitted.   Portions of this note were dictated using DRAGON voice recognition software. Please disregard any errors in transcription.    I have discontinued Jamee Bologna mupirocin  ointment, senna-docusate, and gabapentin . I have also changed her lisinopril -hydrochlorothiazide . Additionally, I am having her maintain her multivitamin, cetirizine, Cholecalciferol (DIALYVITE VITAMIN D  5000 PO), Carboxymethylcellul-Glycerin (LUBRICATING EYE DROPS OP), brimonidine, Probiotic Product (PROBIOTIC DAILY PO), AZO-CRANBERRY PO, ARIPiprazole , buPROPion , furosemide , potassium chloride  SA, montelukast , solifenacin , fluticasone , Myrbetriq , sertraline , and  methylphenidate .  Meds ordered this encounter  Medications   lisinopril -hydrochlorothiazide  (ZESTORETIC ) 20-25 MG tablet    Sig: Take 1 tablet by mouth daily.    Dispense:  90 tablet    Refill:  1

## 2023-12-28 NOTE — Assessment & Plan Note (Signed)
 Encouraged DASH or MIND diet, decrease po intake and increase exercise as tolerated. Needs 7-8 hours of sleep nightly. Avoid trans fats, eat small, frequent meals every 4-5 hours with lean proteins, complex carbs and healthy fats. Minimize simple carbs, high fat foods and processed foods

## 2023-12-29 ENCOUNTER — Ambulatory Visit: Payer: Self-pay | Admitting: Student

## 2023-12-29 LAB — CBC WITH DIFFERENTIAL/PLATELET
Basophils Absolute: 0.1 K/uL (ref 0.0–0.1)
Basophils Relative: 1.3 % (ref 0.0–3.0)
Eosinophils Absolute: 0.2 K/uL (ref 0.0–0.7)
Eosinophils Relative: 3.3 % (ref 0.0–5.0)
HCT: 37.3 % (ref 36.0–46.0)
Hemoglobin: 12.5 g/dL (ref 12.0–15.0)
Lymphocytes Relative: 31.3 % (ref 12.0–46.0)
Lymphs Abs: 2 K/uL (ref 0.7–4.0)
MCHC: 33.6 g/dL (ref 30.0–36.0)
MCV: 90 fl (ref 78.0–100.0)
Monocytes Absolute: 0.5 K/uL (ref 0.1–1.0)
Monocytes Relative: 7.4 % (ref 3.0–12.0)
Neutro Abs: 3.7 K/uL (ref 1.4–7.7)
Neutrophils Relative %: 56.7 % (ref 43.0–77.0)
Platelets: 200 K/uL (ref 150.0–400.0)
RBC: 4.14 Mil/uL (ref 3.87–5.11)
RDW: 15.2 % (ref 11.5–15.5)
WBC: 6.4 K/uL (ref 4.0–10.5)

## 2023-12-29 LAB — COMPREHENSIVE METABOLIC PANEL WITH GFR
ALT: 10 U/L (ref 0–35)
AST: 19 U/L (ref 0–37)
Albumin: 4 g/dL (ref 3.5–5.2)
Alkaline Phosphatase: 92 U/L (ref 39–117)
BUN: 22 mg/dL (ref 6–23)
CO2: 29 meq/L (ref 19–32)
Calcium: 10.2 mg/dL (ref 8.4–10.5)
Chloride: 102 meq/L (ref 96–112)
Creatinine, Ser: 1.14 mg/dL (ref 0.40–1.20)
GFR: 49.3 mL/min — ABNORMAL LOW (ref >60.00)
Glucose, Bld: 79 mg/dL (ref 70–99)
Potassium: 4.6 meq/L (ref 3.5–5.1)
Sodium: 139 meq/L (ref 135–145)
Total Bilirubin: 0.6 mg/dL (ref 0.2–1.2)
Total Protein: 7.5 g/dL (ref 6.0–8.3)

## 2023-12-29 LAB — LIPID PANEL
Cholesterol: 181 mg/dL (ref 0–200)
HDL: 55.8 mg/dL (ref >39.00)
LDL Cholesterol: 110 mg/dL — ABNORMAL HIGH (ref 0–99)
NonHDL: 124.77
Total CHOL/HDL Ratio: 3
Triglycerides: 75 mg/dL (ref 0.0–149.0)
VLDL: 15 mg/dL (ref 0.0–40.0)

## 2023-12-29 LAB — TSH: TSH: 0.41 u[IU]/mL (ref 0.35–5.50)

## 2023-12-29 LAB — B12 AND FOLATE PANEL
Folate: >23.4 ng/mL (ref >5.9)
Vitamin B-12: 585 pg/mL (ref 211–911)

## 2023-12-29 LAB — VITAMIN D 25 HYDROXY (VIT D DEFICIENCY, FRACTURES): VITD: 58.25 ng/mL (ref 30.00–100.00)

## 2023-12-29 LAB — HEMOGLOBIN A1C: Hgb A1c MFr Bld: 5.8 % (ref 4.6–6.5)

## 2024-01-10 ENCOUNTER — Ambulatory Visit: Payer: Medicare (Managed Care)

## 2024-01-10 VITALS — Ht 66.0 in | Wt 248.0 lb

## 2024-01-10 DIAGNOSIS — Z0000 Encounter for general adult medical examination without abnormal findings: Secondary | ICD-10-CM | POA: Diagnosis not present

## 2024-01-10 DIAGNOSIS — Z1231 Encounter for screening mammogram for malignant neoplasm of breast: Secondary | ICD-10-CM

## 2024-01-10 NOTE — Progress Notes (Signed)
 Please attest this visit in the absence of patient primary care provider.    Subjective:   Sydney Pace is a 68 y.o. who presents for a Medicare Wellness preventive visit.  As a reminder, Annual Wellness Visits don't include a physical exam, and some assessments may be limited, especially if this visit is performed virtually. We may recommend an in-person follow-up visit with your provider if needed.  Visit Complete: Virtual I connected with  [name] on 01/10/24 by a audio enabled telemedicine application and verified that I am speaking with the correct person using two identifiers.  Patient Location: Home  Provider Location: Office/Clinic  I discussed the limitations of evaluation and management by telemedicine. The patient expressed understanding and agreed to proceed.  Vital Signs: Because this visit was a virtual/telehealth visit, some criteria may be missing or patient reported. Any vitals not documented were not able to be obtained and vitals that have been documented are patient reported.  VideoDeclined- This patient declined Librarian, academic. Therefore the visit was completed with audio only.  Persons Participating in Visit: Patient.  AWV Questionnaire: No: Patient Medicare AWV questionnaire was not completed prior to this visit.  Cardiac Risk Factors include: advanced age (>50men, >22 women);dyslipidemia;hypertension     Objective:    Today's Vitals   01/10/24 1341  Weight: 248 lb (112.5 kg)  Height: 5' 6 (1.676 m)   Body mass index is 40.03 kg/m.     01/10/2024    2:06 PM 01/25/2023    1:45 AM 12/10/2022    2:10 PM 11/10/2021    9:12 AM 05/30/2020    8:34 AM 08/09/2019   11:22 AM 07/08/2018    2:52 PM  Advanced Directives  Does Patient Have a Medical Advance Directive? Yes Unable to assess, patient is non-responsive or altered mental status No No No No No   Type of Advance Directive Healthcare Power of [place];Living will         Does patient want to make changes to medical advance directive? No - Patient declined        Copy of Healthcare Power of Attorney in Chart? Yes - validated most recent copy scanned in chart (See row information)        Would patient like information on creating a medical advance directive?   No - Patient declined No - Patient declined No - Patient declined No - Patient declined No - Patient declined      Data saved with a previous flowsheet row definition    Current Medications (verified) Outpatient Encounter Medications as of 01/10/2024  Medication Sig   AZO-CRANBERRY PO Take 1 tablet by mouth daily as needed.   brimonidine (ALPHAGAN) 0.2 % ophthalmic solution Place 1 drop into both eyes 2 (two) times daily.   buPROPion  (WELLBUTRIN  XL) 300 MG 24 hr tablet Take 1 tablet (300 mg total) by mouth daily.   cetirizine (ZYRTEC) 10 MG tablet Take 10 mg by mouth every evening.    Cholecalciferol (DIALYVITE VITAMIN D  5000 PO) Take 4,000 Units by mouth daily.    fluticasone  (FLONASE ) 50 MCG/ACT nasal spray Place 2 sprays into both nostrils daily as needed for allergies or rhinitis.   furosemide  (LASIX ) 20 MG tablet Take 1 tablet (20 mg total) by mouth daily as needed.   lisinopril -hydrochlorothiazide  (ZESTORETIC ) 20-25 MG tablet Take 1 tablet by mouth daily.   methylphenidate  (RITALIN ) 20 MG tablet 1 TABLET BY MOUTH IN THE MORNING AND 1 TABLET AT NOON.   Multiple Vitamin (  MULTIVITAMIN) tablet Take 1 tablet by mouth daily.   potassium chloride  SA (KLOR-CON  M) 20 MEQ tablet 1 tab po prn 2nd Furosemide  dose in a day   Probiotic Product (PROBIOTIC DAILY PO) Take 1 tablet by mouth daily.   sertraline  (ZOLOFT ) 100 MG tablet Take 1.5 tablets (150 mg total) by mouth daily.   solifenacin  (VESICARE ) 10 MG tablet One po qd   ARIPiprazole  (ABILIFY ) 5 MG tablet Take 1 tablet (5 mg total) by mouth daily. (Patient not taking: Reported on 01/10/2024)   MYRBETRIQ  50 MG TB24 tablet Take 1 tablet by mouth once daily  (Patient not taking: Reported on 01/10/2024)   [DISCONTINUED] Carboxymethylcellul-Glycerin (LUBRICATING EYE DROPS OP) Place 1 drop into both eyes daily as needed (dry eyes).   [DISCONTINUED] montelukast  (SINGULAIR ) 10 MG tablet Take 1 tablet (10 mg total) by mouth at bedtime. (Patient not taking: Reported on 01/10/2024)   No facility-administered encounter medications on file as of 01/10/2024.    Allergies (verified) Shellfish allergy   History: Past Medical History:  Diagnosis Date   Anemia    Arthritis    on meds   Arthritis 07/06/2017   Constipation    occ   Dehydration 06/09/2016   Depression    on meds   Esophageal reflux 08/08/2012   on meds   GERD (gastroesophageal reflux disease)    on meds   Hypertension    on meds   Inverted nipple    right nipple has always been inverted - per pt   MS (multiple sclerosis) (HCC)    Neuromuscular disorder (HCC)    MS   Other and unspecified hyperlipidemia 08/08/2012   on meds   Seasonal allergies    Thyroid  disease    Past Surgical History:  Procedure Laterality Date   COLONOSCOPY  2020   VC-MAC-prep adeq-TA X 3;   HYSTEROSCOPY  02, 04, 2008   with D&C   ORIF WRIST FRACTURE Right 04/07/2018   Procedure: RIGHT WRIST OPEN REDUCTION INTERNAL FIXATION (ORIF);  Surgeon: Addie Cordella Hamilton, MD;  Location: [facility] OR;  Service: Orthopedics;  Laterality: Right;   POLYPECTOMY  2020   TA x 3;   TMJ ARTHROSCOPY     TONSILLECTOMY     UPPER GASTROINTESTINAL ENDOSCOPY     Family History  Problem Relation Age of Onset   Heart disease Mother        pacemaker   Emphysema Mother    Hypertension Mother    Heart disease Father    Diabetes Father    Heart disease Sister        cad   Sleep apnea Brother    Colon cancer Neg Hx    Esophageal cancer Neg Hx    Rectal cancer Neg Hx    Stomach cancer Neg Hx    Colon polyps Neg Hx    Social History   Socioeconomic History   Marital status: Single    Spouse name: Not on file   Number of  children: Not on file   Years of education: Not on file   Highest education level: Not on file  Occupational History   Not on file  Tobacco Use   Smoking status: Never   Smokeless tobacco: Never  Vaping Use   Vaping status: Never Used  Substance and Sexual Activity   Alcohol  use: Not Currently    Alcohol /week: 0.0 - 1.0 standard drinks of alcohol     Comment: occassional   Drug use: No   Sexual activity: Never  Comment: 1st intercourse 16 yo-5 partners  Other Topics Concern   Not on file  Social History Narrative   Not on file   Social Drivers of Health   Financial Resource Strain: Medium Risk (01/10/2024)   Overall Financial Resource Strain (CARDIA)    Difficulty of Paying Living Expenses: Somewhat hard  Food Insecurity: No Food Insecurity (01/10/2024)   Hunger Vital Sign    Worried About Running Out of Food in the Last Year: Never true    Ran Out of Food in the Last Year: Never true  Transportation Needs: No Transportation Needs (01/10/2024)   PRAPARE - Administrator, Civil Service (Medical): No    Lack of Transportation (Non-Medical): No  Physical Activity: Sufficiently Active (01/10/2024)   Exercise Vital Sign    Days of Exercise per Week: 5 days    Minutes of Exercise per Session: 60 min  Stress: No Stress Concern Present (01/10/2024)   [employer] of Occupational Health - Occupational Stress Questionnaire    Feeling of Stress: Only a little  Social Connections: Socially Isolated (01/10/2024)   Social Connection and Isolation Panel    Frequency of Communication with Friends and Family: Once a week    Frequency of Social Gatherings with Friends and Family: Never    Attends Religious Services: 1 to 4 times per year    Active Member of [employer] or Organizations: No    Attends Engineer, structural: Never    Marital Status: Never married    Tobacco Counseling Counseling given: Not Answered    Clinical Intake:  Pre-visit preparation  completed: Yes  Pain : No/denies pain     BMI - recorded: 40.03 Nutritional Status: BMI > 30  Obese Nutritional Risks: None Diabetes: No  Lab Results  Component Value Date   HGBA1C 5.8 12/28/2023   HGBA1C 5.8 12/31/2022   HGBA1C 5.8 02/03/2022     How often do you need to have someone help you when you read instructions, pamphlets, or other written materials from your doctor or pharmacy?: 1 - Never  Interpreter Needed?: No  Information entered by :: Lolita Libra, CMA   Activities of Daily Living     01/10/2024    1:52 PM 01/25/2023    8:30 AM  In your present state of health, do you have any difficulty performing the following activities:  Hearing? 0 0  Vision? 0 0  Difficulty concentrating or making decisions? 0 1  Walking or climbing stairs? 1 1  Comment uses walker   Dressing or bathing? 0 1  Doing errands, shopping? 0 1  Preparing Food and eating ? N   Using the Toilet? N   In the past six months, have you accidently leaked urine? Y   Comment Has OAB and on medication. Wears depends.   Do you have problems with loss of bowel control? N   Managing your Medications? N   Managing your Finances? N   Housekeeping or managing your Housekeeping? N     Patient Care Team: Domenica Harlene LABOR, MD as PCP - General (Family Medicine) Debrah Lamar BIRCH, MD (Inactive) as Consulting Physician (Gastroenterology) Sater, Charlie LABOR, MD (Neurology) Beverlee Modesto GAILS, MD as Referring Physician (Ophthalmology)  I have updated your Care Teams any recent Medical Services you may have received from other providers in the past year.     Assessment:   This is a routine wellness examination for Sydney Pace.  Hearing/Vision screen Hearing Screening - Comments:: Denies hearing difficulties.  Vision Screening - Comments:: Wears RX glasses -- up to date with routine eye exams.    Goals Addressed   None    Depression Screen     01/10/2024    2:00 PM 12/28/2023    4:06 PM  12/31/2022   10:08 AM 12/11/2022    1:20 PM 12/10/2022    2:00 PM 06/30/2022    3:26 PM 05/21/2022   10:36 AM  PHQ 2/9 Scores  PHQ - 2 Score 2 3 0 0 2    PHQ- 9 Score 5 13 0 0 10       Information is confidential and restricted. Go to Review Flowsheets to unlock data.    Fall Risk     01/10/2024    1:51 PM 12/28/2023    3:28 PM 12/31/2022   10:08 AM 12/11/2022    1:22 PM 12/11/2022    1:20 PM  Fall Risk   Falls in the past year? 1 1 0 1 0  Number falls in past yr: 1 1 0 0 0  Injury with Fall? 0 0 0 0 0  Risk for fall due to : Impaired mobility Impaired balance/gait;Impaired mobility     Follow up Education provided Falls evaluation completed Falls evaluation completed Falls evaluation completed Falls evaluation completed    MEDICARE RISK AT HOME:  Medicare Risk at Home Any stairs in or around the home?: No Home free of loose throw rugs in walkways, pet beds, electrical cords, etc?: Yes Adequate lighting in your home to reduce risk of falls?: Yes Life alert?: No Use of a cane, walker or w/c?: Yes (walker) Grab bars in the bathroom?: Yes Shower chair or bench in shower?: Yes Elevated toilet seat or a handicapped toilet?: Yes  TIMED UP AND GO:  Was the test performed?  No,audio  Cognitive Function: 6CIT completed    11/29/2017    9:29 AM  MMSE - Mini Mental State Exam  Orientation to time 5  Orientation to Place 5  Registration 3  Attention/ Calculation 5  Recall 3  Language- name 2 objects 2  Language- repeat 1  Language- follow 3 step command 3  Language- read & follow direction 1  Write a sentence 1  Copy design 1  Total score 30        12/10/2022    2:03 PM 11/10/2021    9:22 AM  6CIT Screen  What Year? 0 points 0 points  What month? 0 points 0 points  What time? 3 points 0 points  Count back from 20 0 points 0 points  Months in reverse 0 points 4 points  Repeat phrase 0 points 2 points  Total Score 3 points 6 points    Immunizations Immunization History   Administered Date(s) Administered   Fluad Quad(high Dose 65+) 03/05/2021   Fluad Trivalent(High Dose 65+) 03/04/2023   Influenza,inj,Quad PF,6+ Mos 03/06/2013, 03/15/2015, 04/20/2017, 04/20/2019   Influenza-Unspecified 04/20/2017, 04/02/2019   Meningococcal B, OMV 03/05/2021   Moderna Sars-Covid-2 Vaccination 10/25/2019, 11/23/2019   PFIZER(Purple Top)SARS-COV-2 Vaccination 08/12/2020   PNEUMOCOCCAL CONJUGATE-20 01/30/2021   Pfizer Covid-19 Vaccine Bivalent Booster 42yrs & up 07/10/2021   Tdap 08/22/2013   Zoster Recombinant(Shingrix) 07/05/2020, 08/17/2023    Screening Tests Health Maintenance  Topic Date Due   Medicare Annual Wellness (AWV)  12/10/2023   INFLUENZA VACCINE  12/31/2023   DTaP/Tdap/Td (2 - Td or Tdap) 05/29/2024 (Originally 08/23/2023)   COVID-19 Vaccine (5 - 2024-25 season) 05/29/2024 (Originally 01/31/2023)   MAMMOGRAM  01/04/2025  Colonoscopy  01/06/2027   Pneumococcal Vaccine: 50+ Years  Completed   DEXA SCAN  Completed   Hepatitis C Screening  Completed   Zoster Vaccines- Shingrix  Completed   Hepatitis B Vaccines  Aged Out   HPV VACCINES  Aged Out   Meningococcal B Vaccine  Aged Out    Health Maintenance  Health Maintenance Due  Topic Date Due   Medicare Annual Wellness (AWV)  12/10/2023   INFLUENZA VACCINE  12/31/2023   Health Maintenance Items Addressed: Mammogram ordered.  Will get flu and tetanus vaccines at pharmacy.  Additional Screening:  Vision Screening: Recommended annual ophthalmology exams for early detection of glaucoma and other disorders of the eye. Would you like a referral to an eye doctor? No    Dental Screening: Recommended annual dental exams for proper oral hygiene  Community Resource Referral / Chronic Care Management: CRR required this visit? Contact info given for utility assistance through [employer].  CCM required this visit?  No   Plan:    I have personally reviewed and noted the following in the  patient's chart:   Medical and social history Use of alcohol , tobacco or illicit drugs  Current medications and supplements including opioid prescriptions. Patient is not currently taking opioid prescriptions. Functional ability and status Nutritional status Physical activity Advanced directives List of other physicians Hospitalizations, surgeries, and ER visits in previous 12 months Vitals Screenings to include cognitive, depression, and falls Referrals and appointments  In addition, I have reviewed and discussed with patient certain preventive protocols, quality metrics, and best practice recommendations. A written personalized care plan for preventive services as well as general preventive health recommendations were provided to patient.   Lolita Libra, CMA   01/10/2024   After Visit Summary: (MyChart) Due to this being a telephonic visit, the after visit summary with patients personalized plan was offered to patient via MyChart   Notes: See phone note.

## 2024-01-11 ENCOUNTER — Telehealth: Payer: Self-pay | Admitting: *Deleted

## 2024-01-11 NOTE — Patient Instructions (Addendum)
 Sydney Pace , Thank you for taking time out of your busy schedule to complete your Annual Wellness Visit with me. I enjoyed our conversation and look forward to speaking with you again next year. I, as well as your care team,  appreciate your ongoing commitment to your health goals. Please review the following plan we discussed and let me know if I can assist you in the future. Your Game plan/ To Do List   Referrals:   [employer] / utility assistance:  [phone]. Please call them for next steps.  Mammogram / Bone Density:  [facility]:  [phone]  Follow up Visits: Next Medicare AWV with our clinical staff:  01/10/25 3pm   Next Office Visit with your provider: 06/30/24 1:20pm  Clinician Recommendations:  Aim for 30 minutes of exercise or brisk walking, 6-8 glasses of water, and 5 servings of fruits and vegetables each day.   Your Flu vaccine is due in September or after and can be gotten at our office or your pharmacy.    Your tetanus booster is due now and is only covered at your pharmacy.    This is a list of the screening recommended for you and due dates:  Health Maintenance  Topic Date Due   Medicare Annual Wellness Visit  12/10/2023   Flu Shot  12/31/2023   DTaP/Tdap/Td vaccine (2 - Td or Tdap) 05/29/2024*   COVID-19 Vaccine (5 - 2024-25 season) 05/29/2024*   Mammogram  01/04/2025   Colon Cancer Screening  01/06/2027   Pneumococcal Vaccine for age over 59  Completed   DEXA scan (bone density measurement)  Completed   Hepatitis C Screening  Completed   Zoster (Shingles) Vaccine  Completed   Hepatitis B Vaccine  Aged Out   HPV Vaccine  Aged Out   Meningitis B Vaccine  Aged Out  *Topic was postponed. The date shown is not the original due date.    See attachments for Preventive Care and Fall Prevention Tips.

## 2024-01-11 NOTE — Telephone Encounter (Signed)
 Pt had AWV yesterday. She reported some utility insecurity and Contact information given for pt to reach out to [employer] for assistance. Pt also reported that she has let her Abilify  prescription run out and states she will contact Dr Vear for refills. She also reported a couple of falls in the last year without injuries and doctor is aware.  FYI:  pt has active mychart account but doesn't access it and prefers information be mailed to her.

## 2024-01-12 DIAGNOSIS — R2681 Unsteadiness on feet: Secondary | ICD-10-CM | POA: Diagnosis not present

## 2024-01-12 DIAGNOSIS — Z0000 Encounter for general adult medical examination without abnormal findings: Secondary | ICD-10-CM | POA: Diagnosis not present

## 2024-01-12 DIAGNOSIS — N1831 Chronic kidney disease, stage 3a: Secondary | ICD-10-CM | POA: Diagnosis not present

## 2024-01-12 DIAGNOSIS — I129 Hypertensive chronic kidney disease with stage 1 through stage 4 chronic kidney disease, or unspecified chronic kidney disease: Secondary | ICD-10-CM | POA: Diagnosis not present

## 2024-01-12 DIAGNOSIS — G35 Multiple sclerosis: Secondary | ICD-10-CM | POA: Diagnosis not present

## 2024-01-12 DIAGNOSIS — Z6841 Body Mass Index (BMI) 40.0 and over, adult: Secondary | ICD-10-CM | POA: Diagnosis not present

## 2024-01-24 ENCOUNTER — Telehealth: Payer: Self-pay | Admitting: Neurology

## 2024-01-24 NOTE — Telephone Encounter (Signed)
 Phone room: please call pt back and schedule follow up. You can offer 01/25/24 at 2pm with Dr. Vear   Last seen 06/03/23 and has no follow up scheduled. No showed 12/16/23 appt.  Last refilled methylphenidate  20mg  11/26/23 #60.

## 2024-01-24 NOTE — Telephone Encounter (Signed)
Pt is requesting a refill for methylphenidate (RITALIN) 20 MG tablet.  Pharmacy:  [facility] [phone]

## 2024-01-24 NOTE — Telephone Encounter (Signed)
 Called and LVM for pt to call back and schedule appt. Please offer stated appt when pt calls back.

## 2024-01-25 MED ORDER — METHYLPHENIDATE HCL 20 MG PO TABS: ORAL_TABLET | ORAL | 0 refills | Status: DC

## 2024-01-25 NOTE — Telephone Encounter (Addendum)
 Called pt at [phone]. LVM for pt offering appt. Asked her to try and call before 5pm today to let us  know

## 2024-01-25 NOTE — Telephone Encounter (Addendum)
 Dr. Vear- are you ok with calling in refill in meantime? If so, please e-scribe below.   If pt calls, today's appt at 2pm no longer available. Please offer 01/27/24 at 2:30pm instead.

## 2024-01-26 NOTE — Telephone Encounter (Signed)
 Pt states she will not be able to come in Tomorrow at 2 pm .  Pt states she will have to wait on APPT due to insurance not covering  appt .  Pt requested to be scheduled Next year  so She can get [employer]

## 2024-02-03 ENCOUNTER — Ambulatory Visit: Payer: Medicare (Managed Care) | Admitting: Urology

## 2024-02-03 NOTE — Progress Notes (Deleted)
 Assessment: 1. Urge incontinence   2. Multiple sclerosis (HCC)      Plan: I personally reviewed the patient's chart including provider notes, lab results.   Chief Complaint: No chief complaint on file.   History of Present Illness:  Sydney Pace is a 68 y.o. female who is seen in consultation from Domenica Harlene LABOR, MD for evaluation of urinary incontinence. She has multiple sclerosis for a number of years.  She has had urge incontinence with high-volume nocturnal enuresis. She was previously seen by Dr. Gaston at [facility] in September 2024.  She has previously tried oxybutynin , solifenacin , and Myrbetriq .  She was given samples of Gemtesa.   Past Medical History:  Past Medical History:  Diagnosis Date   Anemia    Arthritis    on meds   Arthritis 07/06/2017   Constipation    occ   Dehydration 06/09/2016   Depression    on meds   Esophageal reflux 08/08/2012   on meds   GERD (gastroesophageal reflux disease)    on meds   Hypertension    on meds   Inverted nipple    right nipple has always been inverted - per pt   MS (multiple sclerosis) (HCC)    Neuromuscular disorder (HCC)    MS   Other and unspecified hyperlipidemia 08/08/2012   on meds   Seasonal allergies    Thyroid  disease     Past Surgical History:  Past Surgical History:  Procedure Laterality Date   COLONOSCOPY  2020   VC-MAC-prep adeq-TA X 3;   HYSTEROSCOPY  02, 04, 2008   with D&C   ORIF WRIST FRACTURE Right 04/07/2018   Procedure: RIGHT WRIST OPEN REDUCTION INTERNAL FIXATION (ORIF);  Surgeon: Addie Cordella Hamilton, MD;  Location: [facility] OR;  Service: Orthopedics;  Laterality: Right;   POLYPECTOMY  2020   TA x 3;   TMJ ARTHROSCOPY     TONSILLECTOMY     UPPER GASTROINTESTINAL ENDOSCOPY      Allergies:  Allergies  Allergen Reactions   Shellfish Allergy Hives, Shortness Of Breath, Itching, Swelling and Rash    Pt had previously carried an epi-pen and had a history of severe reaction  to shrimp with breathing problems and swelling of lips and tongue     Family History:  Family History  Problem Relation Age of Onset   Heart disease Mother        pacemaker   Emphysema Mother    Hypertension Mother    Heart disease Father    Diabetes Father    Heart disease Sister        cad   Sleep apnea Brother    Colon cancer Neg Hx    Esophageal cancer Neg Hx    Rectal cancer Neg Hx    Stomach cancer Neg Hx    Colon polyps Neg Hx     Social History:  Social History   Tobacco Use   Smoking status: Never   Smokeless tobacco: Never  Vaping Use   Vaping status: Never Used  Substance Use Topics   Alcohol  use: Not Currently    Alcohol /week: 0.0 - 1.0 standard drinks of alcohol     Comment: occassional   Drug use: No    Review of symptoms:  Constitutional:  Negative for unexplained weight loss, night sweats, fever, chills ENT:  Negative for nose bleeds, sinus pain, painful swallowing CV:  Negative for chest pain, shortness of breath, exercise intolerance, palpitations, loss of consciousness Resp:  Negative for  cough, wheezing, shortness of breath GI:  Negative for nausea, vomiting, diarrhea, bloody stools GU:  Positives noted in HPI; otherwise negative for gross hematuria, dysuria, urinary incontinence Neuro:  Negative for seizures, poor balance, limb weakness, slurred speech Psych:  Negative for lack of energy, depression, anxiety Endocrine:  Negative for polydipsia, polyuria, symptoms of hypoglycemia (dizziness, hunger, sweating) Hematologic:  Negative for anemia, purpura, petechia, prolonged or excessive bleeding, use of anticoagulants  Allergic:  Negative for difficulty breathing or choking as a result of exposure to anything; no shellfish allergy; no allergic response (rash/itch) to materials, foods  Physical exam: LMP  (LMP Unknown)  GENERAL APPEARANCE:  Well appearing, well developed, well nourished, NAD HEENT: Atraumatic, Normocephalic, oropharynx  clear. NECK: Supple without lymphadenopathy or thyromegaly. LUNGS: Clear to auscultation bilaterally. HEART: Regular Rate and Rhythm without murmurs, gallops, or rubs. ABDOMEN: Soft, non-tender, No Masses. EXTREMITIES: Moves all extremities well.  Without clubbing, cyanosis, or edema. NEUROLOGIC:  Alert and oriented x 3, normal gait, CN II-XII grossly intact.  MENTAL STATUS:  Appropriate. BACK:  Non-tender to palpation.  No CVAT SKIN:  Warm, dry and intact.    Results: U/A:

## 2024-02-04 ENCOUNTER — Other Ambulatory Visit: Payer: Self-pay | Admitting: Neurology

## 2024-02-07 NOTE — Telephone Encounter (Signed)
 Last seen on 06/03/23 Follow up scheduled on 06/26/24  I don't see medication listed in last office note.  Rx is pending

## 2024-02-18 DIAGNOSIS — H401122 Primary open-angle glaucoma, left eye, moderate stage: Secondary | ICD-10-CM | POA: Diagnosis not present

## 2024-02-18 DIAGNOSIS — H40021 Open angle with borderline findings, high risk, right eye: Secondary | ICD-10-CM | POA: Diagnosis not present

## 2024-02-18 DIAGNOSIS — H47292 Other optic atrophy, left eye: Secondary | ICD-10-CM | POA: Diagnosis not present

## 2024-02-18 DIAGNOSIS — H5203 Hypermetropia, bilateral: Secondary | ICD-10-CM | POA: Diagnosis not present

## 2024-02-18 DIAGNOSIS — H524 Presbyopia: Secondary | ICD-10-CM | POA: Diagnosis not present

## 2024-02-18 DIAGNOSIS — H2513 Age-related nuclear cataract, bilateral: Secondary | ICD-10-CM | POA: Diagnosis not present

## 2024-02-18 DIAGNOSIS — H52203 Unspecified astigmatism, bilateral: Secondary | ICD-10-CM | POA: Diagnosis not present

## 2024-02-18 DIAGNOSIS — G35 Multiple sclerosis: Secondary | ICD-10-CM | POA: Diagnosis not present

## 2024-02-21 ENCOUNTER — Ambulatory Visit (INDEPENDENT_AMBULATORY_CARE_PROVIDER_SITE_OTHER): Payer: Medicare (Managed Care) | Admitting: Family Medicine

## 2024-02-21 ENCOUNTER — Ambulatory Visit: Payer: Self-pay

## 2024-02-21 ENCOUNTER — Encounter (HOSPITAL_BASED_OUTPATIENT_CLINIC_OR_DEPARTMENT_OTHER): Payer: Self-pay | Admitting: Family Medicine

## 2024-02-21 VITALS — BP 144/70 | HR 69 | Ht 67.0 in | Wt 247.2 lb

## 2024-02-21 DIAGNOSIS — J329 Chronic sinusitis, unspecified: Secondary | ICD-10-CM

## 2024-02-21 DIAGNOSIS — H401122 Primary open-angle glaucoma, left eye, moderate stage: Secondary | ICD-10-CM | POA: Insufficient documentation

## 2024-02-21 DIAGNOSIS — H40021 Open angle with borderline findings, high risk, right eye: Secondary | ICD-10-CM | POA: Insufficient documentation

## 2024-02-21 DIAGNOSIS — H5203 Hypermetropia, bilateral: Secondary | ICD-10-CM | POA: Insufficient documentation

## 2024-02-21 MED ORDER — AMOXICILLIN 875 MG PO TABS: 875.0000 mg | ORAL_TABLET | Freq: Two times a day (BID) | ORAL | 0 refills | Status: AC

## 2024-02-21 NOTE — Assessment & Plan Note (Signed)
 Symptoms suggest bacterial sinusitis - Prescribe antibiotics. - Continue Flonase  nasal spray. - Continue Mucinex  with hydration. - Advise using honey, alone or with tea, for cough relief. - Advise monitoring for fever or shortness of breath; go to ER if these occur.

## 2024-02-21 NOTE — Progress Notes (Signed)
    Procedures performed today:    None.  Independent interpretation of notes and tests performed by another provider:   None.  Brief History, Exam, Impression, and Recommendations:    BP (!) 144/70 (BP Location: Left Arm, Patient Position: Sitting, Cuff Size: Large)   Pulse 69   Ht 5' 7 (1.702 m)   Wt 247 lb 3.2 oz (112.1 kg)   LMP  (LMP Unknown)   SpO2 100%   BMI 38.72 kg/m   Discussed the use of AI scribe software for clinical note transcription with the patient, who gave verbal consent to proceed.  History of Present Illness Sydney Pace is a 68 year old female who presents with worsening cough and headache.  She has been experiencing a productive cough for over a week, initially bringing up mucus and water. She has been using Mucinex  and cough drops, having already used a whole bottle of Mucinex . Honey with lemon has helped reduce the frequency of her cough.  She reports a headache primarily located in the frontal region, which she associates with past sinus infections. The headache has been worsening over time. She has been using Flonase  to manage her symptoms.  Additional symptoms include chills, a runny nose, and a feeling of congestion. No fever, sweats, or shortness of breath. She mentions a decreased appetite and feeling weak, with no desire to engage in activities.  She has been in contact with a friend, Toy, who was sick prior to her own symptoms developing. She feels that her symptoms have been worsening since onset.  She also reports discomfort in her mouth, particularly when eating salty foods, which she attributes to allergies.  Sinusitis, unspecified chronicity, unspecified location Assessment & Plan: Symptoms suggest bacterial sinusitis - Prescribe antibiotics. - Continue Flonase  nasal spray. - Continue Mucinex  with hydration. - Advise using honey, alone or with tea, for cough relief. - Advise monitoring for fever or shortness of breath; go to ER if  these occur.   Other orders -     Amoxicillin ; Take 1 tablet (875 mg total) by mouth 2 (two) times daily for 7 days.  Dispense: 14 tablet; Refill: 0  Return if symptoms worsen or fail to improve.   ___________________________________________ Denita Lun de [place], MD, ABFM, CAQSM Primary Care and Sports Medicine [facility]

## 2024-02-21 NOTE — Telephone Encounter (Unsigned)
 Copied from CRM [id number]. Topic: Clinical - Medical Advice >> Feb 21, 2024  9:46 AM Thersia BROCKS wrote: Reason for CRM: Patient called in stated she has been having cough , hasn't been able to get rid of , has ben taking mediation wanted to know if she could get something called in, or if she would need to be seen by someone . Would like a callback on decision    ----------------------------------------------------------------------- From previous Reason for Contact - Scheduling: Patient/patient representative is calling to schedule an appointment. Refer to attachments for appointment information.

## 2024-02-21 NOTE — Patient Instructions (Signed)
  Medication Instructions:  Your physician recommends that you continue on your current medications as directed. Please refer to the Current Medication list given to you today. --If you need a refill on any your medications before your next appointment, please call your pharmacy first. If no refills are authorized on file call the office.--  You will receive a text message or e-mail with a link to a survey about your care and experience with [place] today! We would greatly appreciate your feedback!   Thanks for letting us be apart of your health journey!!  Primary Care and Sports Medicine   Dr. Ceasar Mons [place]   We encourage you to activate your patient portal called "MyChart".  Sign up information is provided on this After Visit Summary.  MyChart is used to connect with patients for Virtual Visits (Telemedicine).  Patients are able to view lab/test results, encounter notes, upcoming appointments, etc.  Non-urgent messages can be sent to your provider as well. To learn more about what you can do with MyChart, please visit --  [email or web address].

## 2024-02-21 NOTE — Telephone Encounter (Signed)
    FYI Only or Action Required?: FYI only for provider.  Patient was last seen in primary care on 12/28/2023 by Sydney Harlene CROME, NP.  Called Nurse Triage reporting Sinusitis.  Symptoms began a week ago.  Interventions attempted: Rest, hydration, or home remedies.  Symptoms are: gradually worsening.  Triage Disposition: See PCP When Office is Open (Within 3 Days)  Patient/caregiver understands and will follow disposition?: Copied from CRM #[id number]. Topic: Clinical - Red Word Triage >> Feb 21, 2024  1:32 PM Carlyon D wrote: Red Word that prompted transfer to Nurse Triage: pt been sick since last Sunday .SABRA..sinus infection, severe coughing, was coughing up clear/yellow mucus, severe headaches,  no appetite. Reason for Disposition  Lots of coughing  Answer Assessment - Initial Assessment Questions 1. LOCATION: Where does it hurt?      headache 2. ONSET: When did the sinus pain start?  (e.g., hours, days)     Week ago  Sunday  3. SEVERITY: How bad is the pain?   (Scale 0-10; or none, mild, moderate or severe)     Headache mild 4. RECURRENT SYMPTOM: Have you ever had sinus problems before? If Yes, ask: When was the last time? and What happened that time?      yes 5. NASAL CONGESTION: Is the nose blocked? If Yes, ask: Can you open it or must you breathe through your mouth?     No only one side has a polyp what prescrition  6. NASAL DISCHARGE: Do you have discharge from your nose? If so ask, What color?     Clear to yellow  7. FEVER: Do you have a fever? If Yes, ask: What is it, how was it measured, and when did it start?      unsure 8. OTHER SYMPTOMS: Do you have any other symptoms? (e.g., sore throat, cough, earache, difficulty breathing)     Coughing clear/yellow phlegm  Protocols used: Sinus Pain or Congestion-A-AH

## 2024-02-21 NOTE — Telephone Encounter (Signed)
 Appt scheduled

## 2024-03-31 ENCOUNTER — Other Ambulatory Visit: Payer: Self-pay | Admitting: Neurology

## 2024-04-06 ENCOUNTER — Other Ambulatory Visit: Payer: Self-pay | Admitting: Neurology

## 2024-04-06 MED ORDER — METHYLPHENIDATE HCL 20 MG PO TABS: ORAL_TABLET | ORAL | 0 refills | Status: DC

## 2024-04-06 NOTE — Telephone Encounter (Signed)
 Pt called needing a refill on her methylphenidate  (RITALIN ) 20 MG tablet and is needing it sent to the [facility] on [address]

## 2024-04-06 NOTE — Telephone Encounter (Signed)
 Last seen 06/03/23 and next f/u 06/26/24. Last refilled 01/25/24 #60.

## 2024-05-02 ENCOUNTER — Other Ambulatory Visit: Payer: Self-pay | Admitting: Neurology

## 2024-05-05 ENCOUNTER — Other Ambulatory Visit: Payer: Self-pay | Admitting: Neurology

## 2024-05-29 ENCOUNTER — Telehealth: Payer: Self-pay | Admitting: Neurology

## 2024-05-29 NOTE — Telephone Encounter (Signed)
 Pt called to request medication refill methylphenidate  (RITALIN ) 20 MG tablet   Pt medication is to be sent to   [facility] [address] [place] - [address] Phone: [phone]  Fax: [phone]

## 2024-05-31 MED ORDER — METHYLPHENIDATE HCL 20 MG PO TABS: ORAL_TABLET | ORAL | 0 refills | Status: AC

## 2024-05-31 NOTE — Telephone Encounter (Signed)
 Pt Last Seen 06/02/24 Upcoming Appointment 06/26/24  methylphenidate  (RITALIN ) 20 MG tablet 04/06/24

## 2024-06-26 ENCOUNTER — Ambulatory Visit: Payer: Medicare (Managed Care) | Admitting: Neurology

## 2024-06-30 ENCOUNTER — Encounter: Payer: Medicare (Managed Care) | Admitting: Student

## 2024-07-03 ENCOUNTER — Telehealth (HOSPITAL_BASED_OUTPATIENT_CLINIC_OR_DEPARTMENT_OTHER): Payer: Self-pay | Admitting: Family Medicine

## 2024-07-04 NOTE — Telephone Encounter (Signed)
 Returned [employer] and the representative stated pt does not have coverage with them and last note in pts file was from 2022.

## 2024-07-04 NOTE — Telephone Encounter (Signed)
They didn't specify.

## 2024-07-04 NOTE — Telephone Encounter (Unsigned)
 Copied from CRM #[id number]. Topic: General - Call Back - No Documentation >> Jul 03, 2024  9:12 AM Olam RAMAN wrote: Reason for CRM: caller calling about a medication report/faxed asking for all prescriptions Fax: [phone]

## 2024-07-05 NOTE — Progress Notes (Unsigned)
 "  Subjective:     Patient ID: Sydney Pace, female    DOB: 24-Jan-1956, 69 y.o.   MRN: [id number]  No chief complaint on file.   HPI Patient presents for follow-up of chronic conditions.  PMHx of thyroid  disease, GERD, MS, arthritis.  Patient lives alone, she is not married.   Followed Ab:[facility]; Neurology-Dr. Vear  HTN Lisinopril -hydrochlorothiazide  (Zestoretic ) 20-25 mg daily  Furosemide  (Lasix ) 20 mg as needed BP at home: Not taking  MS-followed by neurology-Dr. Charlie Lay [facility] Methylphenidate  (Ritalin ) 20 mg twice daily  OAB/ Urine Incontinence Myrbetriq  50 mg daily, Solifenacin  (Vesicare ) 10 mg daily Reports episodes incontinence at night, she has not been able to take Myrbetriq  consistently due to cost.  Incontinence episodes affecting daily life.  Depression Wellbutrin  300 mg daily, sertraline  (Zoloft ) 150 mg daily. SI/HI: No thoughts of harming self or others. Denies self-medication with alcohol , prescription drugs or illicit drugs. -Pt is not following with a counselor/psychologist.  Allergy-Zyrtec 10 mg at bedtime, Flonase   Patient denies fever, chills, SOB, CP, palpitations, dyspnea, edema, HA, vision changes, N/V/D, abdominal pain, rash, weight changes, and recent illness or hospitalizations.    History of Present Illness              Health Maintenance Due  Topic Date Due   DTaP/Tdap/Td (2 - Td or Tdap) 08/23/2023   Influenza Vaccine  12/31/2023   COVID-19 Vaccine (5 - 2025-26 season) 01/31/2024    Past Medical History:  Diagnosis Date   Anemia    Arthritis    on meds   Arthritis 07/06/2017   Constipation    occ   Dehydration 06/09/2016   Depression    on meds   Esophageal reflux 08/08/2012   on meds   GERD (gastroesophageal reflux disease)    on meds   Hypertension    on meds   Inverted nipple    right nipple has always been inverted - per pt   MS (multiple sclerosis)    Neuromuscular disorder (HCC)     MS   Other and unspecified hyperlipidemia 08/08/2012   on meds   Seasonal allergies    Thyroid  disease     Past Surgical History:  Procedure Laterality Date   COLONOSCOPY  2020   VC-MAC-prep adeq-TA X 3;   HYSTEROSCOPY  02, 04, 2008   with D&C   ORIF WRIST FRACTURE Right 04/07/2018   Procedure: RIGHT WRIST OPEN REDUCTION INTERNAL FIXATION (ORIF);  Surgeon: Addie Cordella Hamilton, MD;  Location: [facility] OR;  Service: Orthopedics;  Laterality: Right;   POLYPECTOMY  2020   TA x 3;   TMJ ARTHROSCOPY     TONSILLECTOMY     UPPER GASTROINTESTINAL ENDOSCOPY      Family History  Problem Relation Age of Onset   Heart disease Mother        pacemaker   Emphysema Mother    Hypertension Mother    Heart disease Father    Diabetes Father    Heart disease Sister        cad   Sleep apnea Brother    Colon cancer Neg Hx    Esophageal cancer Neg Hx    Rectal cancer Neg Hx    Stomach cancer Neg Hx    Colon polyps Neg Hx     Social History   Socioeconomic History   Marital status: Single    Spouse name: Not on file   Number of children: Not on file   Years of education:  Not on file   Highest education level: Not on file  Occupational History   Not on file  Tobacco Use   Smoking status: Never   Smokeless tobacco: Never  Vaping Use   Vaping status: Never Used  Substance and Sexual Activity   Alcohol  use: Not Currently    Alcohol /week: 0.0 - 1.0 standard drinks of alcohol     Comment: occassional   Drug use: No   Sexual activity: Never    Comment: 1st intercourse 17 yo-5 partners  Other Topics Concern   Not on file  Social History Narrative   Not on file   Social Drivers of Health   Tobacco Use: Low Risk (02/21/2024)   Patient History    Smoking Tobacco Use: Never    Smokeless Tobacco Use: Never    Passive Exposure: Not on file  Financial Resource Strain: Medium Risk (01/10/2024)   Overall Financial Resource Strain (CARDIA)    Difficulty of Paying Living Expenses: Somewhat  hard  Food Insecurity: No Food Insecurity (01/10/2024)   Epic    Worried About Programme Researcher, Broadcasting/film/video in the Last Year: Never true    Ran Out of Food in the Last Year: Never true  Transportation Needs: No Transportation Needs (01/10/2024)   Epic    Lack of Transportation (Medical): No    Lack of Transportation (Non-Medical): No  Physical Activity: Sufficiently Active (01/10/2024)   Exercise Vital Sign    Days of Exercise per Week: 5 days    Minutes of Exercise per Session: 60 min  Stress: No Stress Concern Present (01/10/2024)   [employer] of Occupational Health - Occupational Stress Questionnaire    Feeling of Stress: Only a little  Social Connections: Socially Isolated (01/10/2024)   Social Connection and Isolation Panel    Frequency of Communication with Friends and Family: Once a week    Frequency of Social Gatherings with Friends and Family: Never    Attends Religious Services: 1 to 4 times per year    Active Member of [employer] or Organizations: No    Attends Banker Meetings: Never    Marital Status: Never married  Intimate Partner Violence: Not At Risk (01/10/2024)   Epic    Fear of Current or Ex-Partner: No    Emotionally Abused: No    Physically Abused: No    Sexually Abused: No  Depression (PHQ2-9): Medium Risk (01/10/2024)   Depression (PHQ2-9)    PHQ-2 Score: 5  Alcohol  Screen: Low Risk (01/10/2024)   Alcohol  Screen    Last Alcohol  Screening Score (AUDIT): 0  Housing: Low Risk (01/10/2024)   Epic    Unable to Pay for Housing in the Last Year: No    Number of Times Moved in the Last Year: 0    Homeless in the Last Year: No  Utilities: At Risk (01/10/2024)   Epic    Threatened with loss of utilities: Yes  Health Literacy: Adequate Health Literacy (01/10/2024)   B1300 Health Literacy    Frequency of need for help with medical instructions: Never    Outpatient Medications Prior to Visit  Medication Sig Dispense Refill   ARIPiprazole  (ABILIFY ) 5 MG tablet  Take 1 tablet (5 mg total) by mouth daily. (Patient not taking: Reported on 02/21/2024) 30 tablet 3   AZO-CRANBERRY PO Take 1 tablet by mouth daily as needed.     brimonidine (ALPHAGAN) 0.2 % ophthalmic solution Place 1 drop into both eyes 2 (two) times daily.     buPROPion  (WELLBUTRIN   XL) 300 MG 24 hr tablet Take 1 tablet by mouth once daily 90 tablet 3   cetirizine (ZYRTEC) 10 MG tablet Take 10 mg by mouth every evening.      Cholecalciferol (DIALYVITE VITAMIN D  5000 PO) Take 4,000 Units by mouth daily.      fluticasone  (FLONASE ) 50 MCG/ACT nasal spray Place 2 sprays into both nostrils daily as needed for allergies or rhinitis. 16 g 5   furosemide  (LASIX ) 20 MG tablet Take 1 tablet (20 mg total) by mouth daily as needed. 90 tablet 2   lisinopril -hydrochlorothiazide  (ZESTORETIC ) 20-25 MG tablet Take 1 tablet by mouth daily. 90 tablet 1   methylphenidate  (RITALIN ) 20 MG tablet 1 TABLET BY MOUTH IN THE MORNING AND 1 TABLET AT NOON. 60 tablet 0   Multiple Vitamin (MULTIVITAMIN) tablet Take 1 tablet by mouth daily.     MYRBETRIQ  50 MG TB24 tablet Take 1 tablet by mouth once daily 30 tablet 11   potassium chloride  SA (KLOR-CON  M) 20 MEQ tablet 1 tab po prn 2nd Furosemide  dose in a day 90 tablet 1   Probiotic Product (PROBIOTIC DAILY PO) Take 1 tablet by mouth daily.     sertraline  (ZOLOFT ) 100 MG tablet Take 1.5 tablets (150 mg total) by mouth daily. 135 tablet 0   solifenacin  (VESICARE ) 10 MG tablet One po qd 90 tablet 3   No facility-administered medications prior to visit.    Allergies  Allergen Reactions   Shellfish Allergy Hives, Shortness Of Breath, Itching, Swelling and Rash    Pt had previously carried an epi-pen and had a history of severe reaction to shrimp with breathing problems and swelling of lips and tongue     ROS    See HPI Objective:    Physical Exam  General: No acute distress. Awake and conversant. +obese Eyes: Normal conjunctiva, anicteric. Round symmetric pupils.   ENT: Hearing grossly intact. No nasal discharge.  Neck: Neck is supple. No masses or thyromegaly.  Respiratory: CTAB. Respirations are non-labored. No wheezing.  Skin: Warm. No rashes or ulcers.  Psych: Alert and oriented. Cooperative, Appropriate mood and affect, Normal judgment.  CV: RRR. No murmur. No lower extremity edema.  MSK: Ambulates with front wheel walker.  No clubbing or cyanosis. +dark, hyperpigmented skin b/l LEs Neuro:  CN II-XII grossly normal.      Wt Readings from Last 3 Encounters:  02/21/24 247 lb 3.2 oz (112.1 kg)  01/10/24 248 lb (112.5 kg)  12/28/23 248 lb 12.8 oz (112.9 kg)       Assessment & Plan:   Problem List Items Addressed This Visit   None   Depression/ Anxiety Patient reports previously receiving care through [facility] and was prescribed Abilify , which she has since discontinued after stopping follow-up. She noted feeling improvement in mood while on Abilify . Currently, she reports stability on her current medications: Zoloft  100?mg daily and Wellbutrin  300?mg daily, but feels she still struggles with mood and secluding self. Counseling was encouraged, and a list of local behavioral health counselors was provided. A referral to [facility] has also been submitted.   Portions of this note were dictated using DRAGON voice recognition software. Please disregard any errors in transcription.    I am having [name] maintain her multivitamin, cetirizine, Cholecalciferol (DIALYVITE VITAMIN D  5000 PO), brimonidine, Probiotic Product (PROBIOTIC DAILY PO), AZO-CRANBERRY PO, ARIPiprazole , furosemide , potassium chloride  SA, solifenacin , fluticasone , sertraline , lisinopril -hydrochlorothiazide , Myrbetriq , buPROPion , and methylphenidate .  No orders of the defined types were placed in this encounter.  "

## 2024-07-06 ENCOUNTER — Encounter: Payer: Self-pay | Admitting: Student

## 2024-07-06 ENCOUNTER — Encounter: Payer: Medicare (Managed Care) | Admitting: Student

## 2024-07-06 ENCOUNTER — Other Ambulatory Visit: Payer: Self-pay | Admitting: *Deleted

## 2024-07-06 ENCOUNTER — Ambulatory Visit: Payer: Self-pay | Admitting: Student

## 2024-07-06 ENCOUNTER — Other Ambulatory Visit: Payer: Self-pay

## 2024-07-06 ENCOUNTER — Ambulatory Visit: Payer: Medicare (Managed Care) | Admitting: Student

## 2024-07-06 VITALS — BP 132/84 | HR 71 | Temp 98.0°F | Resp 16 | Ht 66.0 in | Wt 251.2 lb

## 2024-07-06 DIAGNOSIS — N3281 Overactive bladder: Secondary | ICD-10-CM

## 2024-07-06 DIAGNOSIS — Z780 Asymptomatic menopausal state: Secondary | ICD-10-CM

## 2024-07-06 DIAGNOSIS — R7989 Other specified abnormal findings of blood chemistry: Secondary | ICD-10-CM

## 2024-07-06 DIAGNOSIS — G35D Multiple sclerosis, unspecified: Secondary | ICD-10-CM

## 2024-07-06 DIAGNOSIS — F32A Depression, unspecified: Secondary | ICD-10-CM

## 2024-07-06 DIAGNOSIS — F332 Major depressive disorder, recurrent severe without psychotic features: Secondary | ICD-10-CM

## 2024-07-06 DIAGNOSIS — I10 Essential (primary) hypertension: Secondary | ICD-10-CM

## 2024-07-06 DIAGNOSIS — E66813 Obesity, class 3: Secondary | ICD-10-CM

## 2024-07-06 DIAGNOSIS — Z139 Encounter for screening, unspecified: Secondary | ICD-10-CM

## 2024-07-06 DIAGNOSIS — Z0000 Encounter for general adult medical examination without abnormal findings: Secondary | ICD-10-CM | POA: Insufficient documentation

## 2024-07-06 DIAGNOSIS — M8580 Other specified disorders of bone density and structure, unspecified site: Secondary | ICD-10-CM

## 2024-07-06 DIAGNOSIS — E559 Vitamin D deficiency, unspecified: Secondary | ICD-10-CM

## 2024-07-06 DIAGNOSIS — Z5941 Food insecurity: Secondary | ICD-10-CM

## 2024-07-06 LAB — TSH: TSH: 0.66 u[IU]/mL (ref 0.35–5.50)

## 2024-07-06 MED ORDER — BUPROPION HCL ER (XL) 300 MG PO TB24: 300.0000 mg | ORAL_TABLET | Freq: Every day | ORAL | 0 refills | Status: AC

## 2024-07-06 MED ORDER — BUPROPION HCL ER (XL) 300 MG PO TB24: 300.0000 mg | ORAL_TABLET | Freq: Every day | ORAL | 0 refills | Status: DC

## 2024-07-06 MED ORDER — MYRBETRIQ 50 MG PO TB24: 50.0000 mg | ORAL_TABLET | Freq: Every day | ORAL | 11 refills | Status: AC

## 2024-07-06 MED ORDER — SERTRALINE HCL 100 MG PO TABS: 150.0000 mg | ORAL_TABLET | Freq: Every day | ORAL | 0 refills | Status: AC

## 2024-07-06 MED ORDER — LISINOPRIL-HYDROCHLOROTHIAZIDE 20-25 MG PO TABS: 1.0000 | ORAL_TABLET | Freq: Every day | ORAL | 1 refills | Status: AC

## 2024-07-06 NOTE — Assessment & Plan Note (Signed)
 Patient encouraged to maintain heart healthy diet, regular exercise, adequate sleep. Consider daily probiotics. Take medications as prescribed

## 2024-07-06 NOTE — Telephone Encounter (Signed)
 Last seen on 06/03/23 No follow up scheduled   Phone room please call to scheduled updated visit.   Dr.Sater I don't see that you mentioned patient taking bupropion  in office notes.   Per note Solifenacin  for bladder.  Consider re-adding Myrbetriq 

## 2024-07-06 NOTE — Assessment & Plan Note (Signed)
 Well controlled, no changes to meds. Encouraged heart healthy diet such as the DASH diet and exercise as tolerated.

## 2024-07-06 NOTE — Assessment & Plan Note (Addendum)
 Elevated scores indicate ongoing depressive symptoms. Financial stressors and SDOH needs c/t to symptoms.  VCBI refferal placed.  She declines Psych referral.  Provided list of local counselors in AVS She is given the number for [facility] emergency room so she can go there if she finds her self in crisis. Denies SI/HI.

## 2024-07-06 NOTE — Assessment & Plan Note (Signed)
 Her BMI today is 46. Encouraged ongoing weight-loss efforts, as even modest reductions can significantly improve overall health. Encouraged DASH or MIND diet, decrease po intake and increase exercise as tolerated. Needs 7-8 hours of sleep nightly. Avoid trans fats, eat small, frequent meals every 4-5 hours with lean proteins, complex carbs and healthy fats. Minimize simple carbs, high fat foods and processed foods

## 2024-07-06 NOTE — Assessment & Plan Note (Signed)
 Encouraged to get adequate exercise, calcium and vitamin d  intake.  Repeat DEXA scan.

## 2024-07-06 NOTE — Assessment & Plan Note (Addendum)
 Stable on Myrbetriq .

## 2024-07-06 NOTE — Patient Instructions (Signed)

## 2024-07-06 NOTE — Assessment & Plan Note (Signed)
 Supplement and monitor

## 2024-07-06 NOTE — Assessment & Plan Note (Signed)
 Follows with neurology-Dr. Suanne at [facility].  Continue to follow-up.  No recent flares.

## 2024-07-07 ENCOUNTER — Telehealth: Payer: Self-pay

## 2024-07-07 ENCOUNTER — Telehealth: Payer: Self-pay | Admitting: Neurology

## 2024-07-07 NOTE — Telephone Encounter (Signed)
 Pt stated that she will start using [facility] for any future refills.

## 2024-07-07 NOTE — Telephone Encounter (Signed)
 noted

## 2024-07-07 NOTE — Telephone Encounter (Signed)
 Copied from CRM #[id number]. Topic: Clinical - Medication Question >> Jul 07, 2024  4:28 PM Alexandria E wrote: Reason for CRM: Patient returning call from [facility], patient verified that she receives her Lisinopril  through [facility] on [address]

## 2024-07-07 NOTE — Telephone Encounter (Signed)
 Returned [facility] call and Elveria was advised that the Sertraline  100 mg was already filled and picked up from [place] pharmacy by patient.

## 2024-07-07 NOTE — Progress Notes (Unsigned)
 Complex Care Management Note Care Guide Note  07/07/2024 Name: Sydney Pace MRN: [id number] DOB: [birth year]   Complex Care Management Outreach Attempts: An unsuccessful telephone outreach was attempted today to offer the patient information about available complex care management services.  Follow Up Plan:  Additional outreach attempts will be made to offer the patient complex care management information and services.   Encounter Outcome:  No Answer  Dreama [facility]  [facility], [facility] VBCI Assistant Direct Dial: [phone]  Fax: [phone]

## 2024-07-07 NOTE — Telephone Encounter (Unsigned)
 Copied from CRM #[id number]. Topic: General - Other >> Jul 07, 2024  9:19 AM Avram MATSU wrote: Reason for CRM: [facility] called to check the status of all active prescriptions and will fax form over again. [phone]

## 2024-07-07 NOTE — Telephone Encounter (Signed)
 [facility] called to informed they will be faxing over request for  a list of Pt medication  to our office

## 2025-01-10 ENCOUNTER — Ambulatory Visit: Payer: Medicare (Managed Care)
